# Patient Record
Sex: Male | Born: 1970
Health system: Southern US, Community
[De-identification: ages and names within clinical notes are randomized; demographics above are authoritative.]

## PROBLEM LIST (undated history)

## (undated) DIAGNOSIS — E039 Hypothyroidism, unspecified: Secondary | ICD-10-CM

## (undated) DIAGNOSIS — K219 Gastro-esophageal reflux disease without esophagitis: Secondary | ICD-10-CM

## (undated) DIAGNOSIS — K589 Irritable bowel syndrome without diarrhea: Secondary | ICD-10-CM

## (undated) DIAGNOSIS — I10 Essential (primary) hypertension: Secondary | ICD-10-CM

## (undated) DIAGNOSIS — F319 Bipolar disorder, unspecified: Secondary | ICD-10-CM

## (undated) DIAGNOSIS — F840 Autistic disorder: Secondary | ICD-10-CM

## (undated) DIAGNOSIS — F419 Anxiety disorder, unspecified: Secondary | ICD-10-CM

## (undated) DIAGNOSIS — F845 Asperger's syndrome: Secondary | ICD-10-CM

## (undated) HISTORY — PX: NO PAST SURGERIES: SHX2092

## (undated) HISTORY — DX: Bipolar disorder, unspecified: F31.9

## (undated) HISTORY — DX: Gastro-esophageal reflux disease without esophagitis: K21.9

## (undated) HISTORY — DX: Anxiety disorder, unspecified: F41.9

## (undated) HISTORY — DX: Autistic disorder: F84.0

---

## 2010-08-26 ENCOUNTER — Encounter: Payer: Self-pay | Admitting: Gastroenterology

## 2010-11-15 ENCOUNTER — Other Ambulatory Visit (HOSPITAL_COMMUNITY): Payer: Self-pay | Admitting: Neurology

## 2010-11-15 DIAGNOSIS — R9409 Abnormal results of other function studies of central nervous system: Secondary | ICD-10-CM

## 2010-11-21 ENCOUNTER — Ambulatory Visit (HOSPITAL_COMMUNITY)
Admission: RE | Admit: 2010-11-21 | Discharge: 2010-11-21 | Disposition: A | Discharge status: Home / Self Care | Payer: Medicare Other | Source: Ambulatory Visit | Attending: Neurology | Admitting: Neurology

## 2010-11-21 DIAGNOSIS — R9409 Abnormal results of other function studies of central nervous system: Secondary | ICD-10-CM | POA: Insufficient documentation

## 2010-11-21 DIAGNOSIS — F319 Bipolar disorder, unspecified: Secondary | ICD-10-CM | POA: Insufficient documentation

## 2010-11-21 MED ORDER — GADOBENATE DIMEGLUMINE 529 MG/ML IV SOLN
12.0000 mL | Freq: Once | INTRAVENOUS | Status: AC
  Administered 2010-11-21: 12 mL via INTRAVENOUS

## 2010-11-23 ENCOUNTER — Other Ambulatory Visit: Payer: Self-pay | Admitting: Neurology

## 2010-11-23 DIAGNOSIS — R9089 Other abnormal findings on diagnostic imaging of central nervous system: Secondary | ICD-10-CM

## 2010-11-28 ENCOUNTER — Ambulatory Visit
Admission: RE | Admit: 2010-11-28 | Discharge: 2010-11-28 | Disposition: A | Discharge status: Home / Self Care | Payer: Medicare Other | Source: Ambulatory Visit | Attending: Neurology | Admitting: Neurology

## 2010-11-28 DIAGNOSIS — R9089 Other abnormal findings on diagnostic imaging of central nervous system: Secondary | ICD-10-CM

## 2010-11-30 ENCOUNTER — Telehealth: Payer: Self-pay | Admitting: Family Medicine

## 2010-12-03 ENCOUNTER — Ambulatory Visit
Admission: RE | Admit: 2010-12-03 | Discharge: 2010-12-03 | Disposition: A | Discharge status: Home / Self Care | Payer: Medicare Other | Source: Ambulatory Visit | Attending: Neurology | Admitting: Neurology

## 2010-12-03 ENCOUNTER — Other Ambulatory Visit: Payer: Self-pay | Admitting: Neurology

## 2010-12-03 DIAGNOSIS — G971 Other reaction to spinal and lumbar puncture: Secondary | ICD-10-CM

## 2011-09-02 DIAGNOSIS — R9409 Abnormal results of other function studies of central nervous system: Secondary | ICD-10-CM | POA: Diagnosis not present

## 2011-09-02 DIAGNOSIS — F319 Bipolar disorder, unspecified: Secondary | ICD-10-CM | POA: Diagnosis not present

## 2011-10-08 DIAGNOSIS — F3175 Bipolar disorder, in partial remission, most recent episode depressed: Secondary | ICD-10-CM | POA: Diagnosis not present

## 2011-10-21 DIAGNOSIS — H5710 Ocular pain, unspecified eye: Secondary | ICD-10-CM | POA: Diagnosis not present

## 2011-11-14 DIAGNOSIS — H1045 Other chronic allergic conjunctivitis: Secondary | ICD-10-CM | POA: Diagnosis not present

## 2011-12-10 DIAGNOSIS — IMO0002 Reserved for concepts with insufficient information to code with codable children: Secondary | ICD-10-CM | POA: Diagnosis not present

## 2011-12-10 DIAGNOSIS — Z79899 Other long term (current) drug therapy: Secondary | ICD-10-CM | POA: Diagnosis not present

## 2011-12-10 DIAGNOSIS — F3175 Bipolar disorder, in partial remission, most recent episode depressed: Secondary | ICD-10-CM | POA: Diagnosis not present

## 2011-12-23 DIAGNOSIS — E291 Testicular hypofunction: Secondary | ICD-10-CM | POA: Diagnosis not present

## 2012-03-16 DIAGNOSIS — F319 Bipolar disorder, unspecified: Secondary | ICD-10-CM | POA: Diagnosis not present

## 2012-03-16 DIAGNOSIS — R9409 Abnormal results of other function studies of central nervous system: Secondary | ICD-10-CM | POA: Diagnosis not present

## 2012-03-16 DIAGNOSIS — F848 Other pervasive developmental disorders: Secondary | ICD-10-CM | POA: Diagnosis not present

## 2012-03-24 DIAGNOSIS — F3175 Bipolar disorder, in partial remission, most recent episode depressed: Secondary | ICD-10-CM | POA: Diagnosis not present

## 2012-03-31 DIAGNOSIS — F848 Other pervasive developmental disorders: Secondary | ICD-10-CM | POA: Diagnosis not present

## 2012-03-31 DIAGNOSIS — F319 Bipolar disorder, unspecified: Secondary | ICD-10-CM | POA: Diagnosis not present

## 2012-03-31 DIAGNOSIS — R9409 Abnormal results of other function studies of central nervous system: Secondary | ICD-10-CM | POA: Diagnosis not present

## 2012-05-06 DIAGNOSIS — Z23 Encounter for immunization: Secondary | ICD-10-CM | POA: Diagnosis not present

## 2012-08-17 DIAGNOSIS — E291 Testicular hypofunction: Secondary | ICD-10-CM | POA: Diagnosis not present

## 2012-08-21 DIAGNOSIS — F3175 Bipolar disorder, in partial remission, most recent episode depressed: Secondary | ICD-10-CM | POA: Diagnosis not present

## 2012-08-21 DIAGNOSIS — Z79899 Other long term (current) drug therapy: Secondary | ICD-10-CM | POA: Diagnosis not present

## 2012-09-08 DIAGNOSIS — F3175 Bipolar disorder, in partial remission, most recent episode depressed: Secondary | ICD-10-CM | POA: Diagnosis not present

## 2012-11-19 DIAGNOSIS — Z0000 Encounter for general adult medical examination without abnormal findings: Secondary | ICD-10-CM | POA: Diagnosis not present

## 2012-11-19 DIAGNOSIS — E039 Hypothyroidism, unspecified: Secondary | ICD-10-CM | POA: Diagnosis not present

## 2012-11-19 DIAGNOSIS — Z1331 Encounter for screening for depression: Secondary | ICD-10-CM | POA: Diagnosis not present

## 2012-11-19 DIAGNOSIS — E291 Testicular hypofunction: Secondary | ICD-10-CM | POA: Diagnosis not present

## 2013-01-20 ENCOUNTER — Telehealth: Payer: Self-pay | Admitting: Diagnostic Neuroimaging

## 2013-01-20 NOTE — Telephone Encounter (Signed)
Calling to reschedule patient's appt with Dr. Marjory Lies (03/29/2013)

## 2013-02-23 DIAGNOSIS — F3175 Bipolar disorder, in partial remission, most recent episode depressed: Secondary | ICD-10-CM | POA: Diagnosis not present

## 2013-03-18 ENCOUNTER — Encounter: Payer: Self-pay | Admitting: Nurse Practitioner

## 2013-03-19 ENCOUNTER — Ambulatory Visit (INDEPENDENT_AMBULATORY_CARE_PROVIDER_SITE_OTHER): Payer: Medicare Other | Admitting: Nurse Practitioner

## 2013-03-19 ENCOUNTER — Encounter: Payer: Self-pay | Admitting: Nurse Practitioner

## 2013-03-19 ENCOUNTER — Ambulatory Visit: Payer: Self-pay | Admitting: Diagnostic Neuroimaging

## 2013-03-19 VITALS — BP 128/78 | HR 62 | Ht 68.0 in | Wt 149.0 lb

## 2013-03-19 DIAGNOSIS — F848 Other pervasive developmental disorders: Secondary | ICD-10-CM | POA: Diagnosis not present

## 2013-03-19 DIAGNOSIS — R9409 Abnormal results of other function studies of central nervous system: Secondary | ICD-10-CM

## 2013-03-19 DIAGNOSIS — F845 Asperger's syndrome: Secondary | ICD-10-CM

## 2013-03-19 DIAGNOSIS — F319 Bipolar disorder, unspecified: Secondary | ICD-10-CM | POA: Diagnosis not present

## 2013-03-19 NOTE — Progress Notes (Signed)
[facility]  PATIENT: George [name] DOB: June 15, 1971   HISTORY FROM: patient, chart REASON FOR VISIT:  1 year follow up   HISTORICAL  CHIEF COMPLAINT:  Chief Complaint  Patient presents with  . Establish Care    Rv in room #9    HISTORY OF PRESENT ILLNESS: 42 year old ambidextrous white single male with a history of bipolar affective disease diagnosed at age 10 by Dr. Wynonia Lawman in [place], [place]. He has been on lithium 300 mg t.i.d. and Depakote ER 500, 500, 250  for 1250 mg ER once per day, but t is currently off of  lithium and also tapering Prozac to 10 mg 3/4 per day.He is on Synthroid 0.075 mg daily, vitamin D 3, and  AndroGel squirts four times daily. When he has been placed on Wellbutrin, he has a   "psychotic break".  He heard persistent sounds such as the echo of a flushing toilet. He had  mood swings. He has a long history of anxiety.He has been followed by Dr. Lenis Noon psychiatrist in [place], [place]. Because of fatigue and decreased sexual drive, he was evaluated in [place] and found to have a low testosterone level. An endocrinologist ordered an MRI of the pituitary which was normal except for white matter lesions or hyper intense signal intensities in the brainstem in the right pons, right cerebellar hemisphere, right medulla, right thalamus. There were several periventricular lesions as well that were thought to represent Dawson's fingers. There was no enhancement.He denies headaches, visual loss,  bowel and bladder dysfunction, or Lhermitte's sign. CBC CMP, TSH , thyroxine, and T3 were normal 05/11/10 and 06/18/10. He believes he has celiac disease and is on a gluten-free diet. He also indicates that he is being evaluated for Aspergers disease.  Workup has included normal neurologic examination 11/12/2010, normal lab tests, B12, Sjgren's antibody, ANA, angiotensin-converting enzyme, and sedimentation rate.  MRI 11/21/2010  showed  abnormal T2 and  flair signal throughout the brain, most pronounced in the pons but also elsewhere in the brain stem  There were  multiple patchy areas involving the hemisphere white matter.   The question of demyelinating disease was raised.  CSF evaluation 11/08/2010  showed glucose 60, protein 46, VDRL nonreactive, normal IgG, negative oligoclonal banding, and no white blood cells or red blood cells. He has no neurologic complaints except tremor. Over the last month he has  headaches which he relates to an anxiety situation with his sister. 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 At times he only gets 4 hours sleep. His testosterone dosages have been increased.  He denies visual loss, double vision, swallowing problems, Lhermitte's sign, swallowing problems, slurred speech, blackout spells, focal arm or leg weakness, numbness, or bowel or bladder incontinence. Since tapering his psychiatric medications his sex drive has improved. He describes an episode of panic attack when while riding his scooter  he was pulled over by the police. 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  UPDATE 03/19/13 (LL):  Patient returns for 1 year follow up visit, former patient of Dr. Sandria Manly.  No new neurological problems.  No complaints.  Minor difficulty swallowing, patient thinks that possibly it is psychosomatic because Dr. Sandria Manly told him to be watchful for it at last visit.  Does not get choked on food or cough when eating.  Has regular visits with psychiatrist and PCP, Dr. Kirby Funk.  REVIEW OF  SYSTEMS: Full 14 system review of systems performed and notable only for:  Constitutional: N/A  Cardiovascular: N/A  Ear/Nose/Throat: minor trouble swallowing  Skin: N/A  Eyes: N/A  Respiratory: minor snoring Gastroitestinal: minor constipation  Hematology/Lymphatic: N/A  Endocrine: N/A Musculoskeletal:N/A  Allergy/Immunology: N/A  Neurological: minor depression, minor decreased  energy Psychiatric: N/A   ALLERGIES: Allergies  Allergen Reactions  . Dairy Aid [Lactase]   . Wheat Bran     HOME MEDICATIONS: Outpatient Prescriptions Prior to Visit  Medication Sig Dispense Refill  . Cholecalciferol (VITAMIN D3) 400 UNITS CAPS Take 400 capsules by mouth 3 (three) times daily.      . divalproex (DEPAKOTE) 250 MG DR tablet Take 250 mg by mouth 5 (five) times daily.      Marland Kitchen FLUoxetine (PROZAC) 10 MG capsule Take 10 mg by mouth 4 (four) times daily.      Marland Kitchen levothyroxine (SYNTHROID, LEVOTHROID) 75 MCG tablet Take 75 mcg by mouth daily before breakfast.      . testosterone (ANDROGEL) 50 MG/5GM GEL Place 5 g onto the skin 4 (four) times daily. 4 Pumps per day       No facility-administered medications prior to visit.    PAST MEDICAL HISTORY: Past Medical History  Diagnosis Date  . Bipolar 1 disorder     PAST SURGICAL HISTORY: No past surgical history on file.  FAMILY HISTORY: No family history on file.  SOCIAL HISTORY: History   Social History  . Marital Status: Single    Spouse Name: N/A    Number of Children: N/A  . Years of Education: college   Occupational History  . Not on file.   Social History Main Topics  . Smoking status: Never Smoker   . Smokeless tobacco: Not on file  . Alcohol Use: No  . Drug Use: No  . Sexual Activity: Not on file   Other Topics Concern  . Not on file   Social History Narrative   He lives at home. He is single and has no children.  He denies use of tobacco, alcohol, illicit drugs and caffeine. He works as an Tree surgeon.      Educational psychologist Vitals:   03/19/13 1402  BP: 128/78  Pulse: 62  Height: 5\' 8"  (1.727 m)  Weight: 149 lb (67.586 kg)   Body mass index is 22.66 kg/(m^2).  Neurologic Exam  Mental Status: Alert and oriented to time, place, and person.  Follows one, two and three step commands.  No aphasia, agnosia, or apraxia. Cranial Nerves: Visual fields are full to count fingers examination.   Extraocular movements full. Marland Kitchen  Hearing intact.  No facial asymmetry.  Tongue midline, uvula midline, and gag present.  Sternocleidomastoid and trapezius testing normal. Motor: 5/5 strength proximally and distally in the upper and lower extremities.  No evidence of pronator drift.   Sensory: Intact to pinprick, light touch Coordination:  Outstretched hand and arm tremor.  Intact finger-to-nose, heel-to-shin. Gait and Station: Can toe walk, heel walk, and tandem gait.  Romberg testing is negative.  Reflexes: DTRs 2+ and equal.   DIAGNOSTIC DATA (LABS, IMAGING, TESTING) - I reviewed patient records, labs, notes, testing and imaging myself where available.  MRI brain (with and without contrast) 03/31/12: 1. Multiple round and ovoid, periventricular, subcortical, juxtacortical, pontine, right middle cerebellar peduncle T2 hyperintensities. No abnormal lesions are seen on post contrast views. These findings are non-specific and considerations include autoimmune, inflammatory, post-infectious, microvascular ischemic or migraine associated etiologies.  2. No significant  change from MRI on 11/21/10.  ASSESSMENT AND PLAN 42 year old male with history of Bipolar Disorder, Asperger's, and abnormal MRI. He is doing very well in terms of his neurologic symptomatology.  Last MRI brain was unchanged from previous, nonspecific.  The cause of his white matter changes is unknown.  PLAN: No specific neurological diagnosis has been found.  Patient was advised to follow up with his medical doctor for his other medical concerns.  He may be referred back to [place] if needed at any time in the future for new concerns.  Danesha Kirchoff NP-C 03/19/2013, 2:11 PM  [facility] [address] [place], [place] [address] [phone]

## 2013-03-29 ENCOUNTER — Ambulatory Visit: Payer: Self-pay | Admitting: Diagnostic Neuroimaging

## 2013-04-20 DIAGNOSIS — Z23 Encounter for immunization: Secondary | ICD-10-CM | POA: Diagnosis not present

## 2013-07-23 DIAGNOSIS — Z0289 Encounter for other administrative examinations: Secondary | ICD-10-CM

## 2013-08-10 DIAGNOSIS — IMO0002 Reserved for concepts with insufficient information to code with codable children: Secondary | ICD-10-CM | POA: Diagnosis not present

## 2013-08-10 DIAGNOSIS — F3175 Bipolar disorder, in partial remission, most recent episode depressed: Secondary | ICD-10-CM | POA: Diagnosis not present

## 2013-08-19 ENCOUNTER — Encounter: Payer: Self-pay | Admitting: Nurse Practitioner

## 2013-08-19 ENCOUNTER — Telehealth: Payer: Self-pay | Admitting: Neurology

## 2013-08-19 NOTE — Telephone Encounter (Signed)
Complete. Fax Confirmation at 1737, 08/19/13.

## 2013-08-19 NOTE — Telephone Encounter (Signed)
Patient needs a letter faxed to [facility] fax # [phone]  Stating [facility] has released him from care. Please call the patient back if there are any questions.

## 2013-10-06 DIAGNOSIS — Z79899 Other long term (current) drug therapy: Secondary | ICD-10-CM | POA: Diagnosis not present

## 2013-10-06 DIAGNOSIS — IMO0002 Reserved for concepts with insufficient information to code with codable children: Secondary | ICD-10-CM | POA: Diagnosis not present

## 2013-10-06 DIAGNOSIS — F3175 Bipolar disorder, in partial remission, most recent episode depressed: Secondary | ICD-10-CM | POA: Diagnosis not present

## 2013-12-02 DIAGNOSIS — Z79899 Other long term (current) drug therapy: Secondary | ICD-10-CM | POA: Diagnosis not present

## 2013-12-02 DIAGNOSIS — Z2089 Contact with and (suspected) exposure to other communicable diseases: Secondary | ICD-10-CM | POA: Diagnosis not present

## 2013-12-02 DIAGNOSIS — E039 Hypothyroidism, unspecified: Secondary | ICD-10-CM | POA: Diagnosis not present

## 2013-12-02 DIAGNOSIS — A630 Anogenital (venereal) warts: Secondary | ICD-10-CM | POA: Diagnosis not present

## 2013-12-02 DIAGNOSIS — N529 Male erectile dysfunction, unspecified: Secondary | ICD-10-CM | POA: Diagnosis not present

## 2013-12-02 DIAGNOSIS — E291 Testicular hypofunction: Secondary | ICD-10-CM | POA: Diagnosis not present

## 2013-12-02 DIAGNOSIS — Z125 Encounter for screening for malignant neoplasm of prostate: Secondary | ICD-10-CM | POA: Diagnosis not present

## 2013-12-02 DIAGNOSIS — Z0000 Encounter for general adult medical examination without abnormal findings: Secondary | ICD-10-CM | POA: Diagnosis not present

## 2013-12-13 ENCOUNTER — Telehealth: Payer: Self-pay | Admitting: *Deleted

## 2013-12-13 ENCOUNTER — Telehealth: Payer: Self-pay | Admitting: Diagnostic Neuroimaging

## 2013-12-13 NOTE — Telephone Encounter (Signed)
Called the patient  back he stated he needed a detailed letter stating he does not have MS and can drive without restriction. I advised him Jeani Hawking was not in and will not be able to send a note until weds, patient showed understanding and was very apologetic for his behavior over the phone on his previous call. He stated he has underlined issues within his self.

## 2013-12-13 NOTE — Telephone Encounter (Signed)
Patient calling and is VERY upset, please call him back regarding wrong form that was sent in to government, says he wants to speak with Richeda, states that he will keep calling back every 30 minutes if he does not hear back from [place].

## 2013-12-13 NOTE — Telephone Encounter (Signed)
Called patient and left a message with his mom letting her know i will be resending the first letter NP Charlott Holler sent.George [name]

## 2013-12-17 DIAGNOSIS — A630 Anogenital (venereal) warts: Secondary | ICD-10-CM | POA: Diagnosis not present

## 2014-01-25 DIAGNOSIS — F3175 Bipolar disorder, in partial remission, most recent episode depressed: Secondary | ICD-10-CM | POA: Diagnosis not present

## 2014-01-25 DIAGNOSIS — IMO0002 Reserved for concepts with insufficient information to code with codable children: Secondary | ICD-10-CM | POA: Diagnosis not present

## 2014-02-22 DIAGNOSIS — A630 Anogenital (venereal) warts: Secondary | ICD-10-CM | POA: Diagnosis not present

## 2014-03-04 ENCOUNTER — Ambulatory Visit (INDEPENDENT_AMBULATORY_CARE_PROVIDER_SITE_OTHER): Payer: Medicare Other | Admitting: Family Medicine

## 2014-03-04 VITALS — BP 146/94 | HR 73 | Temp 98.1°F | Resp 18 | Ht 67.5 in | Wt 153.2 lb

## 2014-03-04 DIAGNOSIS — F411 Generalized anxiety disorder: Secondary | ICD-10-CM | POA: Diagnosis not present

## 2014-03-04 DIAGNOSIS — F319 Bipolar disorder, unspecified: Secondary | ICD-10-CM | POA: Diagnosis not present

## 2014-03-04 DIAGNOSIS — R42 Dizziness and giddiness: Secondary | ICD-10-CM | POA: Diagnosis not present

## 2014-03-04 LAB — POCT UA - MICROSCOPIC ONLY
Bacteria, U Microscopic: NEGATIVE
Casts, Ur, LPF, POC: NEGATIVE
Crystals, Ur, HPF, POC: NEGATIVE
Epithelial cells, urine per micros: NEGATIVE
Mucus, UA: NEGATIVE
RBC, urine, microscopic: NEGATIVE
WBC, Ur, HPF, POC: NEGATIVE
Yeast, UA: NEGATIVE

## 2014-03-04 LAB — POCT CBC
Granulocyte percent: 64 %G (ref 37–80)
HCT, POC: 51 % (ref 43.5–53.7)
Hemoglobin: 17 g/dL (ref 14.1–18.1)
Lymph, poc: 1.7 (ref 0.6–3.4)
MCH, POC: 29.2 pg (ref 27–31.2)
MCHC: 33.3 g/dL (ref 31.8–35.4)
MCV: 87.6 fL (ref 80–97)
MID (cbc): 0.3 (ref 0–0.9)
MPV: 9.3 fL (ref 0–99.8)
POC Granulocyte: 3.6 (ref 2–6.9)
POC LYMPH PERCENT: 30.1 %L (ref 10–50)
POC MID %: 5.9 %M (ref 0–12)
Platelet Count, POC: 157 10*3/uL (ref 142–424)
RBC: 5.82 M/uL (ref 4.69–6.13)
RDW, POC: 13.4 %
WBC: 5.6 10*3/uL (ref 4.6–10.2)

## 2014-03-04 LAB — COMPREHENSIVE METABOLIC PANEL
ALT: 21 U/L (ref 0–53)
AST: 18 U/L (ref 0–37)
Albumin: 4.6 g/dL (ref 3.5–5.2)
Alkaline Phosphatase: 58 U/L (ref 39–117)
BUN: 15 mg/dL (ref 6–23)
CO2: 28 mEq/L (ref 19–32)
Calcium: 9.7 mg/dL (ref 8.4–10.5)
Chloride: 101 mEq/L (ref 96–112)
Creat: 1.14 mg/dL (ref 0.50–1.35)
Glucose, Bld: 76 mg/dL (ref 70–99)
Potassium: 4.7 mEq/L (ref 3.5–5.3)
Sodium: 138 mEq/L (ref 135–145)
Total Bilirubin: 0.5 mg/dL (ref 0.2–1.2)
Total Protein: 7.4 g/dL (ref 6.0–8.3)

## 2014-03-04 LAB — POCT URINALYSIS DIPSTICK
Bilirubin, UA: NEGATIVE
Blood, UA: NEGATIVE
Glucose, UA: NEGATIVE
Ketones, UA: NEGATIVE
Leukocytes, UA: NEGATIVE
Nitrite, UA: NEGATIVE
Protein, UA: NEGATIVE
Spec Grav, UA: <=1.005
Urobilinogen, UA: 0.2
pH, UA: 6

## 2014-03-04 MED ORDER — HYDROXYZINE HCL 10 MG PO TABS: 10.0000 mg | ORAL_TABLET | Freq: Three times a day (TID) | ORAL | Status: DC | PRN

## 2014-03-04 NOTE — Progress Notes (Signed)
Chief Complaint:  Chief Complaint  Patient presents with  . Nausea    x this morning  . Dizziness  . Panic Attack    feeel SOB    HPI: George [name] is a 43 y.o. male who is here for  Feeling a high on xanax that he had 3 days ago ( xanax 0.25 mg) , he got dizzy and nauseated. He went to get to the door post and he fell over. THe whole room was spinning and was unable to heep his balance. He was scared. He sat down, he looked on his computer and was worried about the SEs. He stood on one foot and exercised and went away. His sxs went away completely. The last time he took a xanax was either last night but when he woke up this monring this morning he had similar sxs while laying in bed, dizzy and nervousness and did get nauseated when he sat up. He ot scared and has increase respirations and felt liek he was panicking. He tried calling Dr Daine Gip. HE feels alittle but dizzy but litle nauseated. Everything gets dizzy.  He ahs been on xanax for 1-2 weeks intermittently,  + chills when he fell 3 days ago NO URI sxs He has tingling and numbness in both his arms when he is panicking.  He has anxiety attacks, normally anxiety attack he has fear and anger bit mostly fear.   Dr Candis Schatz ( [facility]) --[phone]  4-5 years ago he was tested for MS, lumbar test was negative. He takes testosterone bc his testosteron is low..  He stoppe eating dairy and then he had dairy,   Past Medical History  Diagnosis Date  . Bipolar 1 disorder   . Anxiety   . Autistic spectrum disorder    History reviewed. No pertinent past surgical history. History   Social History  . Marital Status: Single    Spouse Name: N/A    Number of Children: N/A  . Years of Education: college   Social History Main Topics  . Smoking status: Never Smoker   . Smokeless tobacco: None  . Alcohol Use: No  . Drug Use: No  . Sexual Activity: None   Other Topics Concern  . None   Social History Narrative   He  lives at home. He is single and has no children.  He denies use of tobacco, alcohol, illicit drugs and caffeine. He works as an Training and development officer.    No family history on file. Allergies  Allergen Reactions  . Dairy Aid [Lactase]   . Wheat Bran    Prior to Admission medications   Medication Sig Start Date End Date Taking? Authorizing Provider  ALPRAZolam Duanne Moron) 0.25 MG tablet Take 0.25 mg by mouth at bedtime as needed for anxiety.   Yes Historical Provider, MD  Cholecalciferol (VITAMIN D3) 400 UNITS CAPS Take 400 capsules by mouth 3 (three) times daily.   Yes Historical Provider, MD  divalproex (DEPAKOTE) 250 MG DR tablet Take 250 mg by mouth 5 (five) times daily.   Yes Historical Provider, MD  FLUoxetine (PROZAC) 10 MG capsule Take 10 mg by mouth once.    Yes Historical Provider, MD  levothyroxine (SYNTHROID, LEVOTHROID) 75 MCG tablet Take 75 mcg by mouth daily before breakfast.   Yes Historical Provider, MD  testosterone (ANDROGEL) 50 MG/5GM GEL Place 5 g onto the skin 4 (four) times daily. 4 Pumps per day   Yes Historical Provider, MD  ROS: The patient denies fevers, chills, night sweats, unintentional weight loss, chest pain, palpitations, wheezing, dyspnea on exertion, nausea, vomiting, abdominal pain, dysuria, hematuria, melena, numbness, weakness, or tingling.  All other systems have been reviewed and were otherwise negative with the exception of those mentioned in the HPI and as above.    PHYSICAL EXAM: Filed Vitals:   03/04/14 1139  BP: 146/94  Pulse: 73  Temp: 98.1 F (36.7 C)  Resp: 18   Filed Vitals:   03/04/14 1139  Height: 5' 7.5" (1.715 m)  Weight: 153 lb 3.2 oz (69.491 kg)   Body mass index is 23.63 kg/(m^2).  General: Alert, no acute distress HEENT:  Normocephalic, atraumatic, oropharynx patent. EOMI, PERRLA Cardiovascular:  Regular rate and rhythm, no rubs murmurs or gallops.  No Carotid bruits, radial pulse intact. No pedal edema.  Respiratory: Clear to  auscultation bilaterally.  No wheezes, rales, or rhonchi.  No cyanosis, no use of accessory musculature GI: No organomegaly, abdomen is soft and non-tender, positive bowel sounds.  No masses. Skin: No rashes. Neurologic: Facial musculature symmetric. Psychiatric: Patient is appropriate throughout our interaction. Lymphatic: No cervical lymphadenopathy Musculoskeletal: Gait intact.   LABS: Results for orders placed in visit on 03/04/14  POCT URINALYSIS DIPSTICK      Result Value Ref Range   Color, UA yellow     Clarity, UA clear     Glucose, UA neg     Bilirubin, UA neg     Ketones, UA neg     Spec Grav, UA <=1.005     Blood, UA neg     pH, UA 6.0     Protein, UA neg     Urobilinogen, UA 0.2     Nitrite, UA neg     Leukocytes, UA Negative    POCT UA - MICROSCOPIC ONLY      Result Value Ref Range   WBC, Ur, HPF, POC neg     RBC, urine, microscopic neg     Bacteria, U Microscopic neg     Mucus, UA neg     Epithelial cells, urine per micros neg     Crystals, Ur, HPF, POC neg     Casts, Ur, LPF, POC neg     Yeast, UA neg    POCT CBC      Result Value Ref Range   WBC 5.6  4.6 - 10.2 K/uL   Lymph, poc 1.7  0.6 - 3.4   POC LYMPH PERCENT 30.1  10 - 50 %L   MID (cbc) 0.3  0 - 0.9   POC MID % 5.9  0 - 12 %M   POC Granulocyte 3.6  2 - 6.9   Granulocyte percent 64.0  37 - 80 %G   RBC 5.82  4.69 - 6.13 M/uL   Hemoglobin 17.0  14.1 - 18.1 g/dL   HCT, POC 51.0  43.5 - 53.7 %   MCV 87.6  80 - 97 fL   MCH, POC 29.2  27 - 31.2 pg   MCHC 33.3  31.8 - 35.4 g/dL   RDW, POC 13.4     Platelet Count, POC 157  142 - 424 K/uL   MPV 9.3  0 - 99.8 fL     EKG/XRAY:   Primary read interpreted by Dr. Marin Comment at [facility].   ASSESSMENT/PLAN: Encounter Diagnoses  Name Primary?  . Dizziness and giddiness Yes  . Generalized anxiety disorder   . Bipolar disorder, unspecified    Pleasant autistic spectrum 43  y/o male with PMH of bipolar and GAD with intermittent dizziness, possibly realted to  xanax but unlikely or BPPV. His neuor exam is normal except for autistic spectrum tendencies.  Spoke with his psychiatrist and is ok to rx vistaril and take off xanax. NO SI/HI/halluciantions Gross sideeffects, risk and benefits, and alternatives of medications d/w patient. Patient is aware that all medications have potential sideeffects and we are unable to predict every sideeffect or drug-drug interaction that may occur.  Dawnielle Christiana, [place], DO 03/04/2014 2:37 PM

## 2014-03-31 DIAGNOSIS — IMO0002 Reserved for concepts with insufficient information to code with codable children: Secondary | ICD-10-CM | POA: Diagnosis not present

## 2014-03-31 DIAGNOSIS — F3175 Bipolar disorder, in partial remission, most recent episode depressed: Secondary | ICD-10-CM | POA: Diagnosis not present

## 2014-04-03 DIAGNOSIS — Z23 Encounter for immunization: Secondary | ICD-10-CM | POA: Diagnosis not present

## 2014-06-01 ENCOUNTER — Other Ambulatory Visit: Payer: Self-pay | Admitting: Family Medicine

## 2014-06-13 DIAGNOSIS — F3175 Bipolar disorder, in partial remission, most recent episode depressed: Secondary | ICD-10-CM | POA: Diagnosis not present

## 2014-06-13 DIAGNOSIS — Z79899 Other long term (current) drug therapy: Secondary | ICD-10-CM | POA: Diagnosis not present

## 2014-06-28 DIAGNOSIS — F3175 Bipolar disorder, in partial remission, most recent episode depressed: Secondary | ICD-10-CM | POA: Diagnosis not present

## 2014-07-12 DIAGNOSIS — F3175 Bipolar disorder, in partial remission, most recent episode depressed: Secondary | ICD-10-CM | POA: Diagnosis not present

## 2014-09-06 DIAGNOSIS — F3175 Bipolar disorder, in partial remission, most recent episode depressed: Secondary | ICD-10-CM | POA: Diagnosis not present

## 2014-09-07 DIAGNOSIS — N399 Disorder of urinary system, unspecified: Secondary | ICD-10-CM | POA: Diagnosis not present

## 2014-11-08 DIAGNOSIS — Z0100 Encounter for examination of eyes and vision without abnormal findings: Secondary | ICD-10-CM | POA: Diagnosis not present

## 2014-12-05 DIAGNOSIS — Z125 Encounter for screening for malignant neoplasm of prostate: Secondary | ICD-10-CM | POA: Diagnosis not present

## 2014-12-05 DIAGNOSIS — R635 Abnormal weight gain: Secondary | ICD-10-CM | POA: Diagnosis not present

## 2014-12-05 DIAGNOSIS — E291 Testicular hypofunction: Secondary | ICD-10-CM | POA: Diagnosis not present

## 2014-12-05 DIAGNOSIS — E039 Hypothyroidism, unspecified: Secondary | ICD-10-CM | POA: Diagnosis not present

## 2014-12-05 DIAGNOSIS — F319 Bipolar disorder, unspecified: Secondary | ICD-10-CM | POA: Diagnosis not present

## 2014-12-05 DIAGNOSIS — I10 Essential (primary) hypertension: Secondary | ICD-10-CM | POA: Diagnosis not present

## 2014-12-05 DIAGNOSIS — F845 Asperger's syndrome: Secondary | ICD-10-CM | POA: Diagnosis not present

## 2014-12-06 DIAGNOSIS — E291 Testicular hypofunction: Secondary | ICD-10-CM | POA: Diagnosis not present

## 2014-12-06 DIAGNOSIS — E039 Hypothyroidism, unspecified: Secondary | ICD-10-CM | POA: Diagnosis not present

## 2014-12-16 DIAGNOSIS — F319 Bipolar disorder, unspecified: Secondary | ICD-10-CM | POA: Diagnosis not present

## 2014-12-16 DIAGNOSIS — G4700 Insomnia, unspecified: Secondary | ICD-10-CM | POA: Diagnosis not present

## 2015-02-07 DIAGNOSIS — E039 Hypothyroidism, unspecified: Secondary | ICD-10-CM | POA: Diagnosis not present

## 2015-03-16 DIAGNOSIS — A630 Anogenital (venereal) warts: Secondary | ICD-10-CM | POA: Diagnosis not present

## 2015-03-21 DIAGNOSIS — A630 Anogenital (venereal) warts: Secondary | ICD-10-CM | POA: Diagnosis not present

## 2015-03-30 DIAGNOSIS — F3181 Bipolar II disorder: Secondary | ICD-10-CM | POA: Diagnosis not present

## 2015-03-30 DIAGNOSIS — F840 Autistic disorder: Secondary | ICD-10-CM | POA: Diagnosis not present

## 2015-04-06 DIAGNOSIS — F840 Autistic disorder: Secondary | ICD-10-CM | POA: Diagnosis not present

## 2015-04-06 DIAGNOSIS — F3181 Bipolar II disorder: Secondary | ICD-10-CM | POA: Diagnosis not present

## 2015-04-11 DIAGNOSIS — F840 Autistic disorder: Secondary | ICD-10-CM | POA: Diagnosis not present

## 2015-04-11 DIAGNOSIS — F3181 Bipolar II disorder: Secondary | ICD-10-CM | POA: Diagnosis not present

## 2015-04-18 DIAGNOSIS — F3181 Bipolar II disorder: Secondary | ICD-10-CM | POA: Diagnosis not present

## 2015-04-18 DIAGNOSIS — F840 Autistic disorder: Secondary | ICD-10-CM | POA: Diagnosis not present

## 2015-04-19 DIAGNOSIS — E291 Testicular hypofunction: Secondary | ICD-10-CM | POA: Diagnosis not present

## 2015-04-19 DIAGNOSIS — E039 Hypothyroidism, unspecified: Secondary | ICD-10-CM | POA: Diagnosis not present

## 2015-04-25 DIAGNOSIS — F840 Autistic disorder: Secondary | ICD-10-CM | POA: Diagnosis not present

## 2015-04-25 DIAGNOSIS — F3181 Bipolar II disorder: Secondary | ICD-10-CM | POA: Diagnosis not present

## 2015-04-25 DIAGNOSIS — F3174 Bipolar disorder, in full remission, most recent episode manic: Secondary | ICD-10-CM | POA: Diagnosis not present

## 2015-04-26 DIAGNOSIS — E291 Testicular hypofunction: Secondary | ICD-10-CM | POA: Diagnosis not present

## 2015-05-02 DIAGNOSIS — F840 Autistic disorder: Secondary | ICD-10-CM | POA: Diagnosis not present

## 2015-05-02 DIAGNOSIS — Z23 Encounter for immunization: Secondary | ICD-10-CM | POA: Diagnosis not present

## 2015-05-02 DIAGNOSIS — F3181 Bipolar II disorder: Secondary | ICD-10-CM | POA: Diagnosis not present

## 2015-05-09 DIAGNOSIS — F840 Autistic disorder: Secondary | ICD-10-CM | POA: Diagnosis not present

## 2015-05-09 DIAGNOSIS — F3181 Bipolar II disorder: Secondary | ICD-10-CM | POA: Diagnosis not present

## 2015-05-10 DIAGNOSIS — E291 Testicular hypofunction: Secondary | ICD-10-CM | POA: Diagnosis not present

## 2015-05-16 DIAGNOSIS — F840 Autistic disorder: Secondary | ICD-10-CM | POA: Diagnosis not present

## 2015-05-16 DIAGNOSIS — F3181 Bipolar II disorder: Secondary | ICD-10-CM | POA: Diagnosis not present

## 2015-05-23 DIAGNOSIS — A630 Anogenital (venereal) warts: Secondary | ICD-10-CM | POA: Diagnosis not present

## 2015-05-24 DIAGNOSIS — F840 Autistic disorder: Secondary | ICD-10-CM | POA: Diagnosis not present

## 2015-05-24 DIAGNOSIS — F3181 Bipolar II disorder: Secondary | ICD-10-CM | POA: Diagnosis not present

## 2015-05-30 DIAGNOSIS — F3181 Bipolar II disorder: Secondary | ICD-10-CM | POA: Diagnosis not present

## 2015-05-30 DIAGNOSIS — F840 Autistic disorder: Secondary | ICD-10-CM | POA: Diagnosis not present

## 2015-05-31 DIAGNOSIS — E039 Hypothyroidism, unspecified: Secondary | ICD-10-CM | POA: Diagnosis not present

## 2015-06-06 DIAGNOSIS — F3181 Bipolar II disorder: Secondary | ICD-10-CM | POA: Diagnosis not present

## 2015-06-06 DIAGNOSIS — F840 Autistic disorder: Secondary | ICD-10-CM | POA: Diagnosis not present

## 2015-06-21 DIAGNOSIS — E291 Testicular hypofunction: Secondary | ICD-10-CM | POA: Diagnosis not present

## 2015-06-27 DIAGNOSIS — F3181 Bipolar II disorder: Secondary | ICD-10-CM | POA: Diagnosis not present

## 2015-06-27 DIAGNOSIS — F840 Autistic disorder: Secondary | ICD-10-CM | POA: Diagnosis not present

## 2015-07-12 ENCOUNTER — Ambulatory Visit (INDEPENDENT_AMBULATORY_CARE_PROVIDER_SITE_OTHER): Payer: Medicare Other | Admitting: Psychology

## 2015-07-12 DIAGNOSIS — F3131 Bipolar disorder, current episode depressed, mild: Secondary | ICD-10-CM | POA: Diagnosis not present

## 2015-07-12 DIAGNOSIS — F840 Autistic disorder: Secondary | ICD-10-CM | POA: Diagnosis not present

## 2015-07-12 DIAGNOSIS — E291 Testicular hypofunction: Secondary | ICD-10-CM | POA: Diagnosis not present

## 2015-08-02 DIAGNOSIS — E291 Testicular hypofunction: Secondary | ICD-10-CM | POA: Diagnosis not present

## 2015-08-09 ENCOUNTER — Ambulatory Visit: Payer: Medicare Other | Admitting: Psychology

## 2015-08-16 ENCOUNTER — Ambulatory Visit: Payer: Medicare Other | Admitting: Psychology

## 2015-08-23 DIAGNOSIS — E291 Testicular hypofunction: Secondary | ICD-10-CM | POA: Diagnosis not present

## 2015-08-31 DIAGNOSIS — F840 Autistic disorder: Secondary | ICD-10-CM | POA: Diagnosis not present

## 2015-09-06 DIAGNOSIS — F840 Autistic disorder: Secondary | ICD-10-CM | POA: Diagnosis not present

## 2015-09-13 DIAGNOSIS — F840 Autistic disorder: Secondary | ICD-10-CM | POA: Diagnosis not present

## 2015-09-14 DIAGNOSIS — E291 Testicular hypofunction: Secondary | ICD-10-CM | POA: Diagnosis not present

## 2015-09-20 DIAGNOSIS — F840 Autistic disorder: Secondary | ICD-10-CM | POA: Diagnosis not present

## 2015-09-27 DIAGNOSIS — F840 Autistic disorder: Secondary | ICD-10-CM | POA: Diagnosis not present

## 2015-10-04 DIAGNOSIS — F840 Autistic disorder: Secondary | ICD-10-CM | POA: Diagnosis not present

## 2015-10-05 DIAGNOSIS — E291 Testicular hypofunction: Secondary | ICD-10-CM | POA: Diagnosis not present

## 2015-10-18 DIAGNOSIS — F840 Autistic disorder: Secondary | ICD-10-CM | POA: Diagnosis not present

## 2015-10-25 DIAGNOSIS — F840 Autistic disorder: Secondary | ICD-10-CM | POA: Diagnosis not present

## 2015-10-26 DIAGNOSIS — E291 Testicular hypofunction: Secondary | ICD-10-CM | POA: Diagnosis not present

## 2015-11-01 DIAGNOSIS — F840 Autistic disorder: Secondary | ICD-10-CM | POA: Diagnosis not present

## 2015-11-08 DIAGNOSIS — F840 Autistic disorder: Secondary | ICD-10-CM | POA: Diagnosis not present

## 2015-11-10 DIAGNOSIS — H04123 Dry eye syndrome of bilateral lacrimal glands: Secondary | ICD-10-CM | POA: Diagnosis not present

## 2015-11-16 DIAGNOSIS — F840 Autistic disorder: Secondary | ICD-10-CM | POA: Diagnosis not present

## 2015-11-16 DIAGNOSIS — E291 Testicular hypofunction: Secondary | ICD-10-CM | POA: Diagnosis not present

## 2015-11-23 DIAGNOSIS — F840 Autistic disorder: Secondary | ICD-10-CM | POA: Diagnosis not present

## 2015-11-30 DIAGNOSIS — F840 Autistic disorder: Secondary | ICD-10-CM | POA: Diagnosis not present

## 2015-12-07 DIAGNOSIS — F840 Autistic disorder: Secondary | ICD-10-CM | POA: Diagnosis not present

## 2015-12-07 DIAGNOSIS — E291 Testicular hypofunction: Secondary | ICD-10-CM | POA: Diagnosis not present

## 2015-12-12 DIAGNOSIS — F3131 Bipolar disorder, current episode depressed, mild: Secondary | ICD-10-CM | POA: Diagnosis not present

## 2015-12-14 DIAGNOSIS — F840 Autistic disorder: Secondary | ICD-10-CM | POA: Diagnosis not present

## 2015-12-21 DIAGNOSIS — F840 Autistic disorder: Secondary | ICD-10-CM | POA: Diagnosis not present

## 2015-12-28 DIAGNOSIS — E291 Testicular hypofunction: Secondary | ICD-10-CM | POA: Diagnosis not present

## 2016-01-04 DIAGNOSIS — E291 Testicular hypofunction: Secondary | ICD-10-CM | POA: Diagnosis not present

## 2016-01-04 DIAGNOSIS — E039 Hypothyroidism, unspecified: Secondary | ICD-10-CM | POA: Diagnosis not present

## 2016-01-04 DIAGNOSIS — F840 Autistic disorder: Secondary | ICD-10-CM | POA: Diagnosis not present

## 2016-01-04 DIAGNOSIS — Z125 Encounter for screening for malignant neoplasm of prostate: Secondary | ICD-10-CM | POA: Diagnosis not present

## 2016-01-11 DIAGNOSIS — F840 Autistic disorder: Secondary | ICD-10-CM | POA: Diagnosis not present

## 2016-01-18 DIAGNOSIS — F840 Autistic disorder: Secondary | ICD-10-CM | POA: Diagnosis not present

## 2016-01-18 DIAGNOSIS — E291 Testicular hypofunction: Secondary | ICD-10-CM | POA: Diagnosis not present

## 2016-01-23 DIAGNOSIS — A630 Anogenital (venereal) warts: Secondary | ICD-10-CM | POA: Diagnosis not present

## 2016-01-25 DIAGNOSIS — F840 Autistic disorder: Secondary | ICD-10-CM | POA: Diagnosis not present

## 2016-02-01 DIAGNOSIS — F840 Autistic disorder: Secondary | ICD-10-CM | POA: Diagnosis not present

## 2016-02-08 DIAGNOSIS — F840 Autistic disorder: Secondary | ICD-10-CM | POA: Diagnosis not present

## 2016-02-08 DIAGNOSIS — E291 Testicular hypofunction: Secondary | ICD-10-CM | POA: Diagnosis not present

## 2016-02-15 DIAGNOSIS — F840 Autistic disorder: Secondary | ICD-10-CM | POA: Diagnosis not present

## 2016-02-22 DIAGNOSIS — F840 Autistic disorder: Secondary | ICD-10-CM | POA: Diagnosis not present

## 2016-02-29 DIAGNOSIS — F840 Autistic disorder: Secondary | ICD-10-CM | POA: Diagnosis not present

## 2016-03-01 DIAGNOSIS — E291 Testicular hypofunction: Secondary | ICD-10-CM | POA: Diagnosis not present

## 2016-03-07 DIAGNOSIS — F840 Autistic disorder: Secondary | ICD-10-CM | POA: Diagnosis not present

## 2016-03-11 DIAGNOSIS — F840 Autistic disorder: Secondary | ICD-10-CM | POA: Diagnosis not present

## 2016-03-22 DIAGNOSIS — F840 Autistic disorder: Secondary | ICD-10-CM | POA: Diagnosis not present

## 2016-03-22 DIAGNOSIS — Z7251 High risk heterosexual behavior: Secondary | ICD-10-CM | POA: Diagnosis not present

## 2016-03-22 DIAGNOSIS — E291 Testicular hypofunction: Secondary | ICD-10-CM | POA: Diagnosis not present

## 2016-03-28 DIAGNOSIS — Z23 Encounter for immunization: Secondary | ICD-10-CM | POA: Diagnosis not present

## 2016-04-02 DIAGNOSIS — F840 Autistic disorder: Secondary | ICD-10-CM | POA: Diagnosis not present

## 2016-04-04 DIAGNOSIS — F840 Autistic disorder: Secondary | ICD-10-CM | POA: Diagnosis not present

## 2016-04-11 DIAGNOSIS — F840 Autistic disorder: Secondary | ICD-10-CM | POA: Diagnosis not present

## 2016-04-12 DIAGNOSIS — E291 Testicular hypofunction: Secondary | ICD-10-CM | POA: Diagnosis not present

## 2016-04-18 DIAGNOSIS — F840 Autistic disorder: Secondary | ICD-10-CM | POA: Diagnosis not present

## 2016-05-03 DIAGNOSIS — E291 Testicular hypofunction: Secondary | ICD-10-CM | POA: Diagnosis not present

## 2016-05-09 DIAGNOSIS — F3181 Bipolar II disorder: Secondary | ICD-10-CM | POA: Diagnosis not present

## 2016-05-09 DIAGNOSIS — F840 Autistic disorder: Secondary | ICD-10-CM | POA: Diagnosis not present

## 2016-05-22 DIAGNOSIS — F3181 Bipolar II disorder: Secondary | ICD-10-CM | POA: Diagnosis not present

## 2016-05-22 DIAGNOSIS — F840 Autistic disorder: Secondary | ICD-10-CM | POA: Diagnosis not present

## 2016-05-24 DIAGNOSIS — E291 Testicular hypofunction: Secondary | ICD-10-CM | POA: Diagnosis not present

## 2016-05-28 DIAGNOSIS — F3181 Bipolar II disorder: Secondary | ICD-10-CM | POA: Diagnosis not present

## 2016-05-28 DIAGNOSIS — F840 Autistic disorder: Secondary | ICD-10-CM | POA: Diagnosis not present

## 2016-06-05 DIAGNOSIS — F840 Autistic disorder: Secondary | ICD-10-CM | POA: Diagnosis not present

## 2016-06-05 DIAGNOSIS — F3181 Bipolar II disorder: Secondary | ICD-10-CM | POA: Diagnosis not present

## 2016-06-10 DIAGNOSIS — F3174 Bipolar disorder, in full remission, most recent episode manic: Secondary | ICD-10-CM | POA: Diagnosis not present

## 2016-06-17 DIAGNOSIS — E039 Hypothyroidism, unspecified: Secondary | ICD-10-CM | POA: Diagnosis not present

## 2016-06-18 DIAGNOSIS — F3181 Bipolar II disorder: Secondary | ICD-10-CM | POA: Diagnosis not present

## 2016-06-18 DIAGNOSIS — F840 Autistic disorder: Secondary | ICD-10-CM | POA: Diagnosis not present

## 2016-06-25 DIAGNOSIS — F3181 Bipolar II disorder: Secondary | ICD-10-CM | POA: Diagnosis not present

## 2016-06-25 DIAGNOSIS — F840 Autistic disorder: Secondary | ICD-10-CM | POA: Diagnosis not present

## 2016-07-02 DIAGNOSIS — F3181 Bipolar II disorder: Secondary | ICD-10-CM | POA: Diagnosis not present

## 2016-07-02 DIAGNOSIS — F840 Autistic disorder: Secondary | ICD-10-CM | POA: Diagnosis not present

## 2016-07-08 DIAGNOSIS — E291 Testicular hypofunction: Secondary | ICD-10-CM | POA: Diagnosis not present

## 2016-07-09 DIAGNOSIS — F3181 Bipolar II disorder: Secondary | ICD-10-CM | POA: Diagnosis not present

## 2016-07-09 DIAGNOSIS — F840 Autistic disorder: Secondary | ICD-10-CM | POA: Diagnosis not present

## 2016-07-23 DIAGNOSIS — F3181 Bipolar II disorder: Secondary | ICD-10-CM | POA: Diagnosis not present

## 2016-07-23 DIAGNOSIS — F840 Autistic disorder: Secondary | ICD-10-CM | POA: Diagnosis not present

## 2016-07-29 ENCOUNTER — Encounter (HOSPITAL_COMMUNITY): Payer: Self-pay | Admitting: Emergency Medicine

## 2016-07-29 ENCOUNTER — Emergency Department (HOSPITAL_COMMUNITY)
Admission: EM | Admit: 2016-07-29 | Discharge: 2016-07-29 | Disposition: A | Discharge status: Home / Self Care | Payer: Medicare Other | Attending: Emergency Medicine | Admitting: Emergency Medicine

## 2016-07-29 DIAGNOSIS — Y999 Unspecified external cause status: Secondary | ICD-10-CM | POA: Insufficient documentation

## 2016-07-29 DIAGNOSIS — Y929 Unspecified place or not applicable: Secondary | ICD-10-CM | POA: Diagnosis not present

## 2016-07-29 DIAGNOSIS — X131XXA Other contact with steam and other hot vapors, initial encounter: Secondary | ICD-10-CM | POA: Diagnosis not present

## 2016-07-29 DIAGNOSIS — T2101XA Burn of unspecified degree of chest wall, initial encounter: Secondary | ICD-10-CM | POA: Insufficient documentation

## 2016-07-29 DIAGNOSIS — Z5321 Procedure and treatment not carried out due to patient leaving prior to being seen by health care provider: Secondary | ICD-10-CM | POA: Insufficient documentation

## 2016-07-29 DIAGNOSIS — Y939 Activity, unspecified: Secondary | ICD-10-CM | POA: Insufficient documentation

## 2016-07-29 NOTE — ED Triage Notes (Signed)
Pt was splashed by water on his upper chest after opening a steam dish sanitizer. Redness noted to upper chest.

## 2016-07-29 NOTE — ED Notes (Signed)
Patient reports he wants to leave because "the burn isn't that bad." Patient speaking in complete sentences in NAD, alert and oriented, and ambulatory with steady gait.

## 2016-07-30 DIAGNOSIS — E291 Testicular hypofunction: Secondary | ICD-10-CM | POA: Diagnosis not present

## 2016-07-30 DIAGNOSIS — F840 Autistic disorder: Secondary | ICD-10-CM | POA: Diagnosis not present

## 2016-07-30 DIAGNOSIS — F3181 Bipolar II disorder: Secondary | ICD-10-CM | POA: Diagnosis not present

## 2016-08-13 DIAGNOSIS — F3181 Bipolar II disorder: Secondary | ICD-10-CM | POA: Diagnosis not present

## 2016-08-13 DIAGNOSIS — F840 Autistic disorder: Secondary | ICD-10-CM | POA: Diagnosis not present

## 2016-08-20 DIAGNOSIS — F3181 Bipolar II disorder: Secondary | ICD-10-CM | POA: Diagnosis not present

## 2016-08-20 DIAGNOSIS — E291 Testicular hypofunction: Secondary | ICD-10-CM | POA: Diagnosis not present

## 2016-08-20 DIAGNOSIS — F840 Autistic disorder: Secondary | ICD-10-CM | POA: Diagnosis not present

## 2016-08-21 DIAGNOSIS — F840 Autistic disorder: Secondary | ICD-10-CM | POA: Diagnosis not present

## 2016-08-21 DIAGNOSIS — F3181 Bipolar II disorder: Secondary | ICD-10-CM | POA: Diagnosis not present

## 2016-08-27 DIAGNOSIS — F840 Autistic disorder: Secondary | ICD-10-CM | POA: Diagnosis not present

## 2016-08-27 DIAGNOSIS — F3181 Bipolar II disorder: Secondary | ICD-10-CM | POA: Diagnosis not present

## 2016-09-03 DIAGNOSIS — F840 Autistic disorder: Secondary | ICD-10-CM | POA: Diagnosis not present

## 2016-09-03 DIAGNOSIS — F3181 Bipolar II disorder: Secondary | ICD-10-CM | POA: Diagnosis not present

## 2016-09-10 DIAGNOSIS — F3181 Bipolar II disorder: Secondary | ICD-10-CM | POA: Diagnosis not present

## 2016-09-10 DIAGNOSIS — E291 Testicular hypofunction: Secondary | ICD-10-CM | POA: Diagnosis not present

## 2016-09-10 DIAGNOSIS — F840 Autistic disorder: Secondary | ICD-10-CM | POA: Diagnosis not present

## 2016-09-17 DIAGNOSIS — F840 Autistic disorder: Secondary | ICD-10-CM | POA: Diagnosis not present

## 2016-09-17 DIAGNOSIS — F3181 Bipolar II disorder: Secondary | ICD-10-CM | POA: Diagnosis not present

## 2016-10-01 DIAGNOSIS — F3181 Bipolar II disorder: Secondary | ICD-10-CM | POA: Diagnosis not present

## 2016-10-01 DIAGNOSIS — E291 Testicular hypofunction: Secondary | ICD-10-CM | POA: Diagnosis not present

## 2016-10-01 DIAGNOSIS — F840 Autistic disorder: Secondary | ICD-10-CM | POA: Diagnosis not present

## 2016-10-08 DIAGNOSIS — F840 Autistic disorder: Secondary | ICD-10-CM | POA: Diagnosis not present

## 2016-10-08 DIAGNOSIS — F3181 Bipolar II disorder: Secondary | ICD-10-CM | POA: Diagnosis not present

## 2016-10-22 DIAGNOSIS — E291 Testicular hypofunction: Secondary | ICD-10-CM | POA: Diagnosis not present

## 2016-10-22 DIAGNOSIS — F3181 Bipolar II disorder: Secondary | ICD-10-CM | POA: Diagnosis not present

## 2016-10-22 DIAGNOSIS — F840 Autistic disorder: Secondary | ICD-10-CM | POA: Diagnosis not present

## 2016-11-12 DIAGNOSIS — E291 Testicular hypofunction: Secondary | ICD-10-CM | POA: Diagnosis not present

## 2016-11-19 DIAGNOSIS — F840 Autistic disorder: Secondary | ICD-10-CM | POA: Diagnosis not present

## 2016-11-19 DIAGNOSIS — F3181 Bipolar II disorder: Secondary | ICD-10-CM | POA: Diagnosis not present

## 2016-11-27 DIAGNOSIS — F840 Autistic disorder: Secondary | ICD-10-CM | POA: Diagnosis not present

## 2016-11-27 DIAGNOSIS — F3181 Bipolar II disorder: Secondary | ICD-10-CM | POA: Diagnosis not present

## 2016-12-03 DIAGNOSIS — E291 Testicular hypofunction: Secondary | ICD-10-CM | POA: Diagnosis not present

## 2016-12-06 DIAGNOSIS — F840 Autistic disorder: Secondary | ICD-10-CM | POA: Diagnosis not present

## 2016-12-06 DIAGNOSIS — F3181 Bipolar II disorder: Secondary | ICD-10-CM | POA: Diagnosis not present

## 2016-12-10 DIAGNOSIS — F3181 Bipolar II disorder: Secondary | ICD-10-CM | POA: Diagnosis not present

## 2016-12-10 DIAGNOSIS — F840 Autistic disorder: Secondary | ICD-10-CM | POA: Diagnosis not present

## 2016-12-17 DIAGNOSIS — F840 Autistic disorder: Secondary | ICD-10-CM | POA: Diagnosis not present

## 2016-12-17 DIAGNOSIS — F3181 Bipolar II disorder: Secondary | ICD-10-CM | POA: Diagnosis not present

## 2016-12-24 DIAGNOSIS — F3181 Bipolar II disorder: Secondary | ICD-10-CM | POA: Diagnosis not present

## 2016-12-24 DIAGNOSIS — E039 Hypothyroidism, unspecified: Secondary | ICD-10-CM | POA: Diagnosis not present

## 2016-12-24 DIAGNOSIS — E291 Testicular hypofunction: Secondary | ICD-10-CM | POA: Diagnosis not present

## 2016-12-24 DIAGNOSIS — F840 Autistic disorder: Secondary | ICD-10-CM | POA: Diagnosis not present

## 2016-12-31 DIAGNOSIS — F840 Autistic disorder: Secondary | ICD-10-CM | POA: Diagnosis not present

## 2016-12-31 DIAGNOSIS — F3181 Bipolar II disorder: Secondary | ICD-10-CM | POA: Diagnosis not present

## 2017-01-06 DIAGNOSIS — Z0000 Encounter for general adult medical examination without abnormal findings: Secondary | ICD-10-CM | POA: Diagnosis not present

## 2017-01-06 DIAGNOSIS — E291 Testicular hypofunction: Secondary | ICD-10-CM | POA: Diagnosis not present

## 2017-01-06 DIAGNOSIS — Z125 Encounter for screening for malignant neoplasm of prostate: Secondary | ICD-10-CM | POA: Diagnosis not present

## 2017-01-06 DIAGNOSIS — F319 Bipolar disorder, unspecified: Secondary | ICD-10-CM | POA: Diagnosis not present

## 2017-01-06 DIAGNOSIS — E039 Hypothyroidism, unspecified: Secondary | ICD-10-CM | POA: Diagnosis not present

## 2017-01-06 DIAGNOSIS — Z136 Encounter for screening for cardiovascular disorders: Secondary | ICD-10-CM | POA: Diagnosis not present

## 2017-01-06 DIAGNOSIS — Z79899 Other long term (current) drug therapy: Secondary | ICD-10-CM | POA: Diagnosis not present

## 2017-01-07 DIAGNOSIS — F3181 Bipolar II disorder: Secondary | ICD-10-CM | POA: Diagnosis not present

## 2017-01-07 DIAGNOSIS — F840 Autistic disorder: Secondary | ICD-10-CM | POA: Diagnosis not present

## 2017-01-08 DIAGNOSIS — F3174 Bipolar disorder, in full remission, most recent episode manic: Secondary | ICD-10-CM | POA: Diagnosis not present

## 2017-01-14 DIAGNOSIS — F3181 Bipolar II disorder: Secondary | ICD-10-CM | POA: Diagnosis not present

## 2017-01-14 DIAGNOSIS — F840 Autistic disorder: Secondary | ICD-10-CM | POA: Diagnosis not present

## 2017-01-14 DIAGNOSIS — E291 Testicular hypofunction: Secondary | ICD-10-CM | POA: Diagnosis not present

## 2017-01-15 DIAGNOSIS — R1012 Left upper quadrant pain: Secondary | ICD-10-CM | POA: Diagnosis not present

## 2017-01-15 DIAGNOSIS — N529 Male erectile dysfunction, unspecified: Secondary | ICD-10-CM | POA: Diagnosis not present

## 2017-01-15 DIAGNOSIS — R1032 Left lower quadrant pain: Secondary | ICD-10-CM | POA: Diagnosis not present

## 2017-01-21 DIAGNOSIS — F3181 Bipolar II disorder: Secondary | ICD-10-CM | POA: Diagnosis not present

## 2017-01-21 DIAGNOSIS — F840 Autistic disorder: Secondary | ICD-10-CM | POA: Diagnosis not present

## 2017-01-28 DIAGNOSIS — F3181 Bipolar II disorder: Secondary | ICD-10-CM | POA: Diagnosis not present

## 2017-01-28 DIAGNOSIS — F840 Autistic disorder: Secondary | ICD-10-CM | POA: Diagnosis not present

## 2017-02-04 DIAGNOSIS — F3181 Bipolar II disorder: Secondary | ICD-10-CM | POA: Diagnosis not present

## 2017-02-04 DIAGNOSIS — E291 Testicular hypofunction: Secondary | ICD-10-CM | POA: Diagnosis not present

## 2017-02-04 DIAGNOSIS — F840 Autistic disorder: Secondary | ICD-10-CM | POA: Diagnosis not present

## 2017-02-11 DIAGNOSIS — F840 Autistic disorder: Secondary | ICD-10-CM | POA: Diagnosis not present

## 2017-02-11 DIAGNOSIS — F3181 Bipolar II disorder: Secondary | ICD-10-CM | POA: Diagnosis not present

## 2017-02-18 DIAGNOSIS — F3181 Bipolar II disorder: Secondary | ICD-10-CM | POA: Diagnosis not present

## 2017-02-18 DIAGNOSIS — F840 Autistic disorder: Secondary | ICD-10-CM | POA: Diagnosis not present

## 2017-02-20 DIAGNOSIS — M79674 Pain in right toe(s): Secondary | ICD-10-CM | POA: Diagnosis not present

## 2017-02-20 DIAGNOSIS — R1013 Epigastric pain: Secondary | ICD-10-CM | POA: Diagnosis not present

## 2017-02-25 DIAGNOSIS — E291 Testicular hypofunction: Secondary | ICD-10-CM | POA: Diagnosis not present

## 2017-02-25 DIAGNOSIS — F840 Autistic disorder: Secondary | ICD-10-CM | POA: Diagnosis not present

## 2017-02-25 DIAGNOSIS — F3181 Bipolar II disorder: Secondary | ICD-10-CM | POA: Diagnosis not present

## 2017-03-04 DIAGNOSIS — F3181 Bipolar II disorder: Secondary | ICD-10-CM | POA: Diagnosis not present

## 2017-03-04 DIAGNOSIS — F840 Autistic disorder: Secondary | ICD-10-CM | POA: Diagnosis not present

## 2017-03-11 DIAGNOSIS — F3181 Bipolar II disorder: Secondary | ICD-10-CM | POA: Diagnosis not present

## 2017-03-11 DIAGNOSIS — F840 Autistic disorder: Secondary | ICD-10-CM | POA: Diagnosis not present

## 2017-03-18 DIAGNOSIS — E291 Testicular hypofunction: Secondary | ICD-10-CM | POA: Diagnosis not present

## 2017-03-18 DIAGNOSIS — F840 Autistic disorder: Secondary | ICD-10-CM | POA: Diagnosis not present

## 2017-03-18 DIAGNOSIS — F3181 Bipolar II disorder: Secondary | ICD-10-CM | POA: Diagnosis not present

## 2017-03-20 DIAGNOSIS — R1013 Epigastric pain: Secondary | ICD-10-CM | POA: Diagnosis not present

## 2017-03-20 DIAGNOSIS — Z113 Encounter for screening for infections with a predominantly sexual mode of transmission: Secondary | ICD-10-CM | POA: Diagnosis not present

## 2017-03-25 DIAGNOSIS — R195 Other fecal abnormalities: Secondary | ICD-10-CM | POA: Diagnosis not present

## 2017-03-25 DIAGNOSIS — F3181 Bipolar II disorder: Secondary | ICD-10-CM | POA: Diagnosis not present

## 2017-03-25 DIAGNOSIS — F840 Autistic disorder: Secondary | ICD-10-CM | POA: Diagnosis not present

## 2017-03-26 DIAGNOSIS — K589 Irritable bowel syndrome without diarrhea: Secondary | ICD-10-CM | POA: Diagnosis not present

## 2017-04-11 DIAGNOSIS — E291 Testicular hypofunction: Secondary | ICD-10-CM | POA: Diagnosis not present

## 2017-04-15 DIAGNOSIS — F840 Autistic disorder: Secondary | ICD-10-CM | POA: Diagnosis not present

## 2017-04-15 DIAGNOSIS — F3181 Bipolar II disorder: Secondary | ICD-10-CM | POA: Diagnosis not present

## 2017-04-16 DIAGNOSIS — Z23 Encounter for immunization: Secondary | ICD-10-CM | POA: Diagnosis not present

## 2017-04-22 DIAGNOSIS — F840 Autistic disorder: Secondary | ICD-10-CM | POA: Diagnosis not present

## 2017-04-22 DIAGNOSIS — F3181 Bipolar II disorder: Secondary | ICD-10-CM | POA: Diagnosis not present

## 2017-04-28 DIAGNOSIS — J029 Acute pharyngitis, unspecified: Secondary | ICD-10-CM | POA: Diagnosis not present

## 2017-04-29 DIAGNOSIS — F840 Autistic disorder: Secondary | ICD-10-CM | POA: Diagnosis not present

## 2017-04-29 DIAGNOSIS — F3181 Bipolar II disorder: Secondary | ICD-10-CM | POA: Diagnosis not present

## 2017-05-02 DIAGNOSIS — E291 Testicular hypofunction: Secondary | ICD-10-CM | POA: Diagnosis not present

## 2017-05-13 DIAGNOSIS — F840 Autistic disorder: Secondary | ICD-10-CM | POA: Diagnosis not present

## 2017-05-13 DIAGNOSIS — F3181 Bipolar II disorder: Secondary | ICD-10-CM | POA: Diagnosis not present

## 2017-05-20 DIAGNOSIS — F840 Autistic disorder: Secondary | ICD-10-CM | POA: Diagnosis not present

## 2017-05-20 DIAGNOSIS — F3181 Bipolar II disorder: Secondary | ICD-10-CM | POA: Diagnosis not present

## 2017-05-26 DIAGNOSIS — E291 Testicular hypofunction: Secondary | ICD-10-CM | POA: Diagnosis not present

## 2017-05-27 DIAGNOSIS — F840 Autistic disorder: Secondary | ICD-10-CM | POA: Diagnosis not present

## 2017-05-27 DIAGNOSIS — F3181 Bipolar II disorder: Secondary | ICD-10-CM | POA: Diagnosis not present

## 2017-06-03 DIAGNOSIS — F840 Autistic disorder: Secondary | ICD-10-CM | POA: Diagnosis not present

## 2017-06-03 DIAGNOSIS — F3181 Bipolar II disorder: Secondary | ICD-10-CM | POA: Diagnosis not present

## 2017-06-16 DIAGNOSIS — E291 Testicular hypofunction: Secondary | ICD-10-CM | POA: Diagnosis not present

## 2017-06-17 DIAGNOSIS — F840 Autistic disorder: Secondary | ICD-10-CM | POA: Diagnosis not present

## 2017-06-17 DIAGNOSIS — F3181 Bipolar II disorder: Secondary | ICD-10-CM | POA: Diagnosis not present

## 2017-06-24 DIAGNOSIS — F3181 Bipolar II disorder: Secondary | ICD-10-CM | POA: Diagnosis not present

## 2017-06-24 DIAGNOSIS — F840 Autistic disorder: Secondary | ICD-10-CM | POA: Diagnosis not present

## 2017-07-01 DIAGNOSIS — F3181 Bipolar II disorder: Secondary | ICD-10-CM | POA: Diagnosis not present

## 2017-07-01 DIAGNOSIS — F840 Autistic disorder: Secondary | ICD-10-CM | POA: Diagnosis not present

## 2017-07-07 DIAGNOSIS — E291 Testicular hypofunction: Secondary | ICD-10-CM | POA: Diagnosis not present

## 2017-07-08 DIAGNOSIS — F3181 Bipolar II disorder: Secondary | ICD-10-CM | POA: Diagnosis not present

## 2017-07-08 DIAGNOSIS — F840 Autistic disorder: Secondary | ICD-10-CM | POA: Diagnosis not present

## 2017-07-15 DIAGNOSIS — F840 Autistic disorder: Secondary | ICD-10-CM | POA: Diagnosis not present

## 2017-07-15 DIAGNOSIS — F3181 Bipolar II disorder: Secondary | ICD-10-CM | POA: Diagnosis not present

## 2017-07-15 DIAGNOSIS — F3174 Bipolar disorder, in full remission, most recent episode manic: Secondary | ICD-10-CM | POA: Diagnosis not present

## 2017-08-01 DIAGNOSIS — E291 Testicular hypofunction: Secondary | ICD-10-CM | POA: Diagnosis not present

## 2017-08-21 DIAGNOSIS — E291 Testicular hypofunction: Secondary | ICD-10-CM | POA: Diagnosis not present

## 2017-08-21 DIAGNOSIS — R1084 Generalized abdominal pain: Secondary | ICD-10-CM | POA: Diagnosis not present

## 2017-08-21 DIAGNOSIS — K644 Residual hemorrhoidal skin tags: Secondary | ICD-10-CM | POA: Diagnosis not present

## 2017-09-11 DIAGNOSIS — E291 Testicular hypofunction: Secondary | ICD-10-CM | POA: Diagnosis not present

## 2017-10-03 DIAGNOSIS — E291 Testicular hypofunction: Secondary | ICD-10-CM | POA: Diagnosis not present

## 2017-10-24 DIAGNOSIS — E291 Testicular hypofunction: Secondary | ICD-10-CM | POA: Diagnosis not present

## 2017-11-14 DIAGNOSIS — E291 Testicular hypofunction: Secondary | ICD-10-CM | POA: Diagnosis not present

## 2017-12-08 DIAGNOSIS — E291 Testicular hypofunction: Secondary | ICD-10-CM | POA: Diagnosis not present

## 2017-12-09 ENCOUNTER — Ambulatory Visit: Payer: Self-pay | Admitting: Internal Medicine

## 2017-12-09 DIAGNOSIS — E739 Lactose intolerance, unspecified: Secondary | ICD-10-CM | POA: Diagnosis not present

## 2017-12-09 DIAGNOSIS — K9041 Non-celiac gluten sensitivity: Secondary | ICD-10-CM | POA: Diagnosis not present

## 2017-12-30 DIAGNOSIS — E291 Testicular hypofunction: Secondary | ICD-10-CM | POA: Diagnosis not present

## 2018-01-08 DIAGNOSIS — E291 Testicular hypofunction: Secondary | ICD-10-CM | POA: Diagnosis not present

## 2018-01-08 DIAGNOSIS — E039 Hypothyroidism, unspecified: Secondary | ICD-10-CM | POA: Diagnosis not present

## 2018-01-08 DIAGNOSIS — K589 Irritable bowel syndrome without diarrhea: Secondary | ICD-10-CM | POA: Diagnosis not present

## 2018-01-08 DIAGNOSIS — R29898 Other symptoms and signs involving the musculoskeletal system: Secondary | ICD-10-CM | POA: Diagnosis not present

## 2018-01-08 DIAGNOSIS — I10 Essential (primary) hypertension: Secondary | ICD-10-CM | POA: Diagnosis not present

## 2018-01-20 DIAGNOSIS — E291 Testicular hypofunction: Secondary | ICD-10-CM | POA: Diagnosis not present

## 2018-02-10 DIAGNOSIS — E291 Testicular hypofunction: Secondary | ICD-10-CM | POA: Diagnosis not present

## 2018-02-13 DIAGNOSIS — F840 Autistic disorder: Secondary | ICD-10-CM | POA: Diagnosis not present

## 2018-02-13 DIAGNOSIS — F3181 Bipolar II disorder: Secondary | ICD-10-CM | POA: Diagnosis not present

## 2018-02-20 DIAGNOSIS — F3181 Bipolar II disorder: Secondary | ICD-10-CM | POA: Diagnosis not present

## 2018-02-20 DIAGNOSIS — F840 Autistic disorder: Secondary | ICD-10-CM | POA: Diagnosis not present

## 2018-02-25 DIAGNOSIS — F840 Autistic disorder: Secondary | ICD-10-CM | POA: Diagnosis not present

## 2018-02-25 DIAGNOSIS — M722 Plantar fascial fibromatosis: Secondary | ICD-10-CM | POA: Diagnosis not present

## 2018-02-25 DIAGNOSIS — F3181 Bipolar II disorder: Secondary | ICD-10-CM | POA: Diagnosis not present

## 2018-03-13 DIAGNOSIS — F3181 Bipolar II disorder: Secondary | ICD-10-CM | POA: Diagnosis not present

## 2018-03-13 DIAGNOSIS — F840 Autistic disorder: Secondary | ICD-10-CM | POA: Diagnosis not present

## 2018-03-20 DIAGNOSIS — F840 Autistic disorder: Secondary | ICD-10-CM | POA: Diagnosis not present

## 2018-03-20 DIAGNOSIS — F3181 Bipolar II disorder: Secondary | ICD-10-CM | POA: Diagnosis not present

## 2018-03-25 DIAGNOSIS — F3181 Bipolar II disorder: Secondary | ICD-10-CM | POA: Diagnosis not present

## 2018-03-25 DIAGNOSIS — F840 Autistic disorder: Secondary | ICD-10-CM | POA: Diagnosis not present

## 2018-04-03 DIAGNOSIS — F3181 Bipolar II disorder: Secondary | ICD-10-CM | POA: Diagnosis not present

## 2018-04-03 DIAGNOSIS — F840 Autistic disorder: Secondary | ICD-10-CM | POA: Diagnosis not present

## 2018-04-10 DIAGNOSIS — F3181 Bipolar II disorder: Secondary | ICD-10-CM | POA: Diagnosis not present

## 2018-04-10 DIAGNOSIS — F840 Autistic disorder: Secondary | ICD-10-CM | POA: Diagnosis not present

## 2018-04-16 DIAGNOSIS — F3181 Bipolar II disorder: Secondary | ICD-10-CM | POA: Diagnosis not present

## 2018-04-16 DIAGNOSIS — F840 Autistic disorder: Secondary | ICD-10-CM | POA: Diagnosis not present

## 2018-04-17 DIAGNOSIS — F840 Autistic disorder: Secondary | ICD-10-CM | POA: Diagnosis not present

## 2018-04-17 DIAGNOSIS — F3181 Bipolar II disorder: Secondary | ICD-10-CM | POA: Diagnosis not present

## 2018-04-21 DIAGNOSIS — F3181 Bipolar II disorder: Secondary | ICD-10-CM | POA: Diagnosis not present

## 2018-04-21 DIAGNOSIS — F840 Autistic disorder: Secondary | ICD-10-CM | POA: Diagnosis not present

## 2018-04-29 DIAGNOSIS — Z1211 Encounter for screening for malignant neoplasm of colon: Secondary | ICD-10-CM | POA: Diagnosis not present

## 2018-04-29 DIAGNOSIS — K589 Irritable bowel syndrome without diarrhea: Secondary | ICD-10-CM | POA: Diagnosis not present

## 2018-05-01 DIAGNOSIS — F840 Autistic disorder: Secondary | ICD-10-CM | POA: Diagnosis not present

## 2018-05-01 DIAGNOSIS — F3181 Bipolar II disorder: Secondary | ICD-10-CM | POA: Diagnosis not present

## 2018-05-22 DIAGNOSIS — F3181 Bipolar II disorder: Secondary | ICD-10-CM | POA: Diagnosis not present

## 2018-05-22 DIAGNOSIS — F840 Autistic disorder: Secondary | ICD-10-CM | POA: Diagnosis not present

## 2018-05-25 DIAGNOSIS — F3181 Bipolar II disorder: Secondary | ICD-10-CM | POA: Diagnosis not present

## 2018-05-25 DIAGNOSIS — F840 Autistic disorder: Secondary | ICD-10-CM | POA: Diagnosis not present

## 2018-05-26 DIAGNOSIS — F840 Autistic disorder: Secondary | ICD-10-CM | POA: Diagnosis not present

## 2018-05-26 DIAGNOSIS — E291 Testicular hypofunction: Secondary | ICD-10-CM | POA: Diagnosis not present

## 2018-05-26 DIAGNOSIS — F3181 Bipolar II disorder: Secondary | ICD-10-CM | POA: Diagnosis not present

## 2018-05-29 DIAGNOSIS — F3181 Bipolar II disorder: Secondary | ICD-10-CM | POA: Diagnosis not present

## 2018-05-29 DIAGNOSIS — F840 Autistic disorder: Secondary | ICD-10-CM | POA: Diagnosis not present

## 2018-06-05 DIAGNOSIS — F840 Autistic disorder: Secondary | ICD-10-CM | POA: Diagnosis not present

## 2018-06-05 DIAGNOSIS — F3181 Bipolar II disorder: Secondary | ICD-10-CM | POA: Diagnosis not present

## 2018-06-12 DIAGNOSIS — F840 Autistic disorder: Secondary | ICD-10-CM | POA: Diagnosis not present

## 2018-06-12 DIAGNOSIS — F3181 Bipolar II disorder: Secondary | ICD-10-CM | POA: Diagnosis not present

## 2018-06-16 DIAGNOSIS — E291 Testicular hypofunction: Secondary | ICD-10-CM | POA: Diagnosis not present

## 2018-06-17 DIAGNOSIS — F3181 Bipolar II disorder: Secondary | ICD-10-CM | POA: Diagnosis not present

## 2018-06-17 DIAGNOSIS — F840 Autistic disorder: Secondary | ICD-10-CM | POA: Diagnosis not present

## 2018-06-24 DIAGNOSIS — Z79899 Other long term (current) drug therapy: Secondary | ICD-10-CM | POA: Diagnosis not present

## 2018-06-26 DIAGNOSIS — F3181 Bipolar II disorder: Secondary | ICD-10-CM | POA: Diagnosis not present

## 2018-06-26 DIAGNOSIS — F840 Autistic disorder: Secondary | ICD-10-CM | POA: Diagnosis not present

## 2018-07-07 DIAGNOSIS — E291 Testicular hypofunction: Secondary | ICD-10-CM | POA: Diagnosis not present

## 2018-07-28 DIAGNOSIS — E291 Testicular hypofunction: Secondary | ICD-10-CM | POA: Diagnosis not present

## 2018-08-05 DIAGNOSIS — S0990XA Unspecified injury of head, initial encounter: Secondary | ICD-10-CM

## 2018-08-05 HISTORY — DX: Unspecified injury of head, initial encounter: S09.90XA

## 2018-08-18 DIAGNOSIS — E291 Testicular hypofunction: Secondary | ICD-10-CM | POA: Diagnosis not present

## 2018-09-08 DIAGNOSIS — E291 Testicular hypofunction: Secondary | ICD-10-CM | POA: Diagnosis not present

## 2018-09-29 DIAGNOSIS — E291 Testicular hypofunction: Secondary | ICD-10-CM | POA: Diagnosis not present

## 2019-01-01 DIAGNOSIS — E291 Testicular hypofunction: Secondary | ICD-10-CM | POA: Diagnosis not present

## 2019-01-27 DIAGNOSIS — E291 Testicular hypofunction: Secondary | ICD-10-CM | POA: Diagnosis not present

## 2019-02-17 DIAGNOSIS — E291 Testicular hypofunction: Secondary | ICD-10-CM | POA: Diagnosis not present

## 2019-03-10 DIAGNOSIS — R200 Anesthesia of skin: Secondary | ICD-10-CM | POA: Diagnosis not present

## 2019-03-10 DIAGNOSIS — Z23 Encounter for immunization: Secondary | ICD-10-CM | POA: Diagnosis not present

## 2019-03-10 DIAGNOSIS — E291 Testicular hypofunction: Secondary | ICD-10-CM | POA: Diagnosis not present

## 2019-03-18 DIAGNOSIS — R200 Anesthesia of skin: Secondary | ICD-10-CM | POA: Diagnosis not present

## 2019-03-31 DIAGNOSIS — E291 Testicular hypofunction: Secondary | ICD-10-CM | POA: Diagnosis not present

## 2019-04-15 DIAGNOSIS — R200 Anesthesia of skin: Secondary | ICD-10-CM | POA: Diagnosis not present

## 2019-04-21 DIAGNOSIS — E291 Testicular hypofunction: Secondary | ICD-10-CM | POA: Diagnosis not present

## 2019-05-12 DIAGNOSIS — E291 Testicular hypofunction: Secondary | ICD-10-CM | POA: Diagnosis not present

## 2019-06-02 DIAGNOSIS — E291 Testicular hypofunction: Secondary | ICD-10-CM | POA: Diagnosis not present

## 2019-06-23 DIAGNOSIS — E291 Testicular hypofunction: Secondary | ICD-10-CM | POA: Diagnosis not present

## 2019-07-14 DIAGNOSIS — E291 Testicular hypofunction: Secondary | ICD-10-CM | POA: Diagnosis not present

## 2019-07-15 DIAGNOSIS — Z0000 Encounter for general adult medical examination without abnormal findings: Secondary | ICD-10-CM | POA: Diagnosis not present

## 2019-07-15 DIAGNOSIS — F845 Asperger's syndrome: Secondary | ICD-10-CM | POA: Diagnosis not present

## 2019-07-15 DIAGNOSIS — K589 Irritable bowel syndrome without diarrhea: Secondary | ICD-10-CM | POA: Diagnosis not present

## 2019-07-15 DIAGNOSIS — F319 Bipolar disorder, unspecified: Secondary | ICD-10-CM | POA: Diagnosis not present

## 2019-07-15 DIAGNOSIS — I10 Essential (primary) hypertension: Secondary | ICD-10-CM | POA: Diagnosis not present

## 2019-07-15 DIAGNOSIS — Z5181 Encounter for therapeutic drug level monitoring: Secondary | ICD-10-CM | POA: Diagnosis not present

## 2019-07-15 DIAGNOSIS — E291 Testicular hypofunction: Secondary | ICD-10-CM | POA: Diagnosis not present

## 2019-07-15 DIAGNOSIS — E039 Hypothyroidism, unspecified: Secondary | ICD-10-CM | POA: Diagnosis not present

## 2019-07-22 DIAGNOSIS — F319 Bipolar disorder, unspecified: Secondary | ICD-10-CM | POA: Diagnosis not present

## 2019-07-22 DIAGNOSIS — I10 Essential (primary) hypertension: Secondary | ICD-10-CM | POA: Diagnosis not present

## 2019-07-22 DIAGNOSIS — E039 Hypothyroidism, unspecified: Secondary | ICD-10-CM | POA: Diagnosis not present

## 2019-07-22 DIAGNOSIS — E291 Testicular hypofunction: Secondary | ICD-10-CM | POA: Diagnosis not present

## 2019-07-22 DIAGNOSIS — Z5181 Encounter for therapeutic drug level monitoring: Secondary | ICD-10-CM | POA: Diagnosis not present

## 2019-08-04 DIAGNOSIS — E291 Testicular hypofunction: Secondary | ICD-10-CM | POA: Diagnosis not present

## 2019-08-25 DIAGNOSIS — I10 Essential (primary) hypertension: Secondary | ICD-10-CM | POA: Diagnosis not present

## 2019-08-25 DIAGNOSIS — E291 Testicular hypofunction: Secondary | ICD-10-CM | POA: Diagnosis not present

## 2019-09-15 DIAGNOSIS — E291 Testicular hypofunction: Secondary | ICD-10-CM | POA: Diagnosis not present

## 2019-10-06 DIAGNOSIS — E291 Testicular hypofunction: Secondary | ICD-10-CM | POA: Diagnosis not present

## 2019-10-26 DIAGNOSIS — I10 Essential (primary) hypertension: Secondary | ICD-10-CM | POA: Diagnosis not present

## 2019-10-26 DIAGNOSIS — E291 Testicular hypofunction: Secondary | ICD-10-CM | POA: Diagnosis not present

## 2019-11-16 DIAGNOSIS — E291 Testicular hypofunction: Secondary | ICD-10-CM | POA: Diagnosis not present

## 2019-11-18 ENCOUNTER — Ambulatory Visit: Payer: Self-pay | Attending: Internal Medicine

## 2019-11-18 DIAGNOSIS — Z23 Encounter for immunization: Secondary | ICD-10-CM

## 2019-11-18 NOTE — Progress Notes (Signed)
   Covid-19 Vaccination Clinic  Name:  George [name]    MRN: [id number] DOB: 01-01-1971  11/18/2019  Mr. George [name] was observed post Covid-19 immunization for 15 minutes without incident. He was provided with Vaccine Information Sheet and instruction to access the V-Safe system.   Mr. George [name] was instructed to call 911 with any severe reactions post vaccine: Marland Kitchen Difficulty breathing  . Swelling of face and throat  . A fast heartbeat  . A bad rash all over body  . Dizziness and weakness   Immunizations Administered    Name Date Dose VIS Date Route   Pfizer COVID-19 Vaccine 11/18/2019  8:23 AM 0.3 mL 07/16/2019 Intramuscular   Manufacturer: [place]   Lot: [id number]   [place]: [id number]

## 2019-12-07 DIAGNOSIS — E291 Testicular hypofunction: Secondary | ICD-10-CM | POA: Diagnosis not present

## 2019-12-13 ENCOUNTER — Ambulatory Visit: Payer: Self-pay | Attending: Internal Medicine

## 2019-12-13 DIAGNOSIS — Z23 Encounter for immunization: Secondary | ICD-10-CM

## 2019-12-13 NOTE — Progress Notes (Signed)
   Covid-19 Vaccination Clinic  Name:  George [name]    MRN: [id number] DOB: 1970/09/28  12/13/2019  George [name] was observed post Covid-19 immunization for 15 minutes without incident. He was provided with Vaccine Information Sheet and instruction to access the V-Safe system.   George [name] was instructed to call 911 with any severe reactions post vaccine: Marland Kitchen Difficulty breathing  . Swelling of face and throat  . A fast heartbeat  . A bad rash all over body  . Dizziness and weakness   Immunizations Administered    Name Date Dose VIS Date Route   Pfizer COVID-19 Vaccine 12/13/2019  8:16 AM 0.3 mL 09/29/2018 Intramuscular   Manufacturer: [place]   Lot: [id number]   [place]: [id number]

## 2019-12-28 DIAGNOSIS — E291 Testicular hypofunction: Secondary | ICD-10-CM | POA: Diagnosis not present

## 2020-01-05 DIAGNOSIS — F3176 Bipolar disorder, in full remission, most recent episode depressed: Secondary | ICD-10-CM | POA: Diagnosis not present

## 2020-01-05 DIAGNOSIS — F3174 Bipolar disorder, in full remission, most recent episode manic: Secondary | ICD-10-CM | POA: Diagnosis not present

## 2020-01-05 DIAGNOSIS — F900 Attention-deficit hyperactivity disorder, predominantly inattentive type: Secondary | ICD-10-CM | POA: Diagnosis not present

## 2020-01-18 DIAGNOSIS — E291 Testicular hypofunction: Secondary | ICD-10-CM | POA: Diagnosis not present

## 2020-02-08 DIAGNOSIS — E291 Testicular hypofunction: Secondary | ICD-10-CM | POA: Diagnosis not present

## 2020-02-28 DIAGNOSIS — I10 Essential (primary) hypertension: Secondary | ICD-10-CM | POA: Diagnosis not present

## 2020-02-28 DIAGNOSIS — E039 Hypothyroidism, unspecified: Secondary | ICD-10-CM | POA: Diagnosis not present

## 2020-02-28 DIAGNOSIS — F319 Bipolar disorder, unspecified: Secondary | ICD-10-CM | POA: Diagnosis not present

## 2020-02-29 DIAGNOSIS — E291 Testicular hypofunction: Secondary | ICD-10-CM | POA: Diagnosis not present

## 2020-03-17 ENCOUNTER — Other Ambulatory Visit: Payer: Self-pay | Admitting: Internal Medicine

## 2020-03-17 DIAGNOSIS — R531 Weakness: Secondary | ICD-10-CM | POA: Diagnosis not present

## 2020-03-21 DIAGNOSIS — E291 Testicular hypofunction: Secondary | ICD-10-CM | POA: Diagnosis not present

## 2020-04-12 ENCOUNTER — Other Ambulatory Visit: Payer: Medicare Other

## 2020-04-13 DIAGNOSIS — E291 Testicular hypofunction: Secondary | ICD-10-CM | POA: Diagnosis not present

## 2020-05-01 ENCOUNTER — Other Ambulatory Visit (HOSPITAL_COMMUNITY): Payer: Self-pay | Admitting: Internal Medicine

## 2020-05-01 DIAGNOSIS — R531 Weakness: Secondary | ICD-10-CM

## 2020-05-04 DIAGNOSIS — E291 Testicular hypofunction: Secondary | ICD-10-CM | POA: Diagnosis not present

## 2020-05-13 ENCOUNTER — Other Ambulatory Visit (HOSPITAL_COMMUNITY)
Admission: RE | Admit: 2020-05-13 | Discharge: 2020-05-13 | Disposition: A | Discharge status: Home / Self Care | Payer: PPO | Source: Ambulatory Visit | Attending: Internal Medicine | Admitting: Internal Medicine

## 2020-05-13 DIAGNOSIS — Z20822 Contact with and (suspected) exposure to covid-19: Secondary | ICD-10-CM | POA: Diagnosis not present

## 2020-05-13 DIAGNOSIS — Z01812 Encounter for preprocedural laboratory examination: Secondary | ICD-10-CM | POA: Diagnosis not present

## 2020-05-13 LAB — SARS CORONAVIRUS 2 (TAT 6-24 HRS): SARS Coronavirus 2: NEGATIVE

## 2020-05-15 ENCOUNTER — Encounter (HOSPITAL_COMMUNITY): Payer: Self-pay | Admitting: *Deleted

## 2020-05-15 ENCOUNTER — Other Ambulatory Visit: Payer: Self-pay

## 2020-05-15 DIAGNOSIS — I10 Essential (primary) hypertension: Secondary | ICD-10-CM | POA: Diagnosis not present

## 2020-05-15 DIAGNOSIS — Z01818 Encounter for other preprocedural examination: Secondary | ICD-10-CM | POA: Diagnosis not present

## 2020-05-15 NOTE — Progress Notes (Signed)
H/P came from office.  George [name] denies chest pain or shortness of breath. Patient tested negative for Covid_10/9/21_ and has been in quarantine since that time.

## 2020-05-15 NOTE — Progress Notes (Signed)
No recent H&P in Epic, paper work sent by Dr. Delene Ruffini office does not contain a H&P. I have called Dr. Delene Ruffini office and left message on his nurse's voicemail that we need a H&P done within the last 30 days.

## 2020-05-16 ENCOUNTER — Ambulatory Visit (HOSPITAL_COMMUNITY)
Admission: RE | Admit: 2020-05-16 | Discharge: 2020-05-16 | Disposition: A | Discharge status: Home / Self Care | Payer: PPO | Source: Ambulatory Visit | Attending: Internal Medicine | Admitting: Internal Medicine

## 2020-05-16 ENCOUNTER — Other Ambulatory Visit: Payer: Self-pay

## 2020-05-16 ENCOUNTER — Ambulatory Visit (HOSPITAL_COMMUNITY): Payer: PPO | Admitting: Certified Registered Nurse Anesthetist

## 2020-05-16 ENCOUNTER — Encounter (HOSPITAL_COMMUNITY)
Admission: RE | Disposition: A | Discharge status: Home / Self Care | Payer: Self-pay | Source: Home / Self Care | Attending: Internal Medicine

## 2020-05-16 ENCOUNTER — Encounter (HOSPITAL_COMMUNITY): Payer: Self-pay

## 2020-05-16 ENCOUNTER — Ambulatory Visit (HOSPITAL_COMMUNITY)
Admission: RE | Admit: 2020-05-16 | Discharge: 2020-05-16 | Disposition: A | Discharge status: Home / Self Care | Payer: PPO | Attending: Internal Medicine | Admitting: Internal Medicine

## 2020-05-16 DIAGNOSIS — Z886 Allergy status to analgesic agent status: Secondary | ICD-10-CM | POA: Insufficient documentation

## 2020-05-16 DIAGNOSIS — K219 Gastro-esophageal reflux disease without esophagitis: Secondary | ICD-10-CM | POA: Insufficient documentation

## 2020-05-16 DIAGNOSIS — E039 Hypothyroidism, unspecified: Secondary | ICD-10-CM | POA: Insufficient documentation

## 2020-05-16 DIAGNOSIS — I10 Essential (primary) hypertension: Secondary | ICD-10-CM | POA: Diagnosis not present

## 2020-05-16 DIAGNOSIS — G9389 Other specified disorders of brain: Secondary | ICD-10-CM | POA: Diagnosis not present

## 2020-05-16 DIAGNOSIS — J341 Cyst and mucocele of nose and nasal sinus: Secondary | ICD-10-CM | POA: Diagnosis not present

## 2020-05-16 DIAGNOSIS — R9082 White matter disease, unspecified: Secondary | ICD-10-CM | POA: Diagnosis not present

## 2020-05-16 DIAGNOSIS — Z79899 Other long term (current) drug therapy: Secondary | ICD-10-CM | POA: Diagnosis not present

## 2020-05-16 DIAGNOSIS — Z888 Allergy status to other drugs, medicaments and biological substances status: Secondary | ICD-10-CM | POA: Diagnosis not present

## 2020-05-16 DIAGNOSIS — F319 Bipolar disorder, unspecified: Secondary | ICD-10-CM | POA: Insufficient documentation

## 2020-05-16 DIAGNOSIS — R531 Weakness: Secondary | ICD-10-CM | POA: Insufficient documentation

## 2020-05-16 DIAGNOSIS — F419 Anxiety disorder, unspecified: Secondary | ICD-10-CM | POA: Insufficient documentation

## 2020-05-16 DIAGNOSIS — F845 Asperger's syndrome: Secondary | ICD-10-CM | POA: Diagnosis not present

## 2020-05-16 HISTORY — DX: Hypothyroidism, unspecified: E03.9

## 2020-05-16 HISTORY — DX: Essential (primary) hypertension: I10

## 2020-05-16 HISTORY — DX: Irritable bowel syndrome, unspecified: K58.9

## 2020-05-16 HISTORY — DX: Asperger's syndrome: F84.5

## 2020-05-16 HISTORY — PX: RADIOLOGY WITH ANESTHESIA: SHX6223

## 2020-05-16 LAB — BASIC METABOLIC PANEL
Anion gap: 11 (ref 5–15)
BUN: 17 mg/dL (ref 6–20)
CO2: 23 mmol/L (ref 22–32)
Calcium: 9.4 mg/dL (ref 8.9–10.3)
Chloride: 105 mmol/L (ref 98–111)
Creatinine, Ser: 1.26 mg/dL — ABNORMAL HIGH (ref 0.61–1.24)
GFR, Estimated: >60 mL/min (ref >60)
Glucose, Bld: 77 mg/dL (ref 70–99)
Potassium: 4 mmol/L (ref 3.5–5.1)
Sodium: 139 mmol/L (ref 135–145)

## 2020-05-16 SURGERY — MRI WITH ANESTHESIA
Anesthesia: General

## 2020-05-16 MED ORDER — LACTATED RINGERS IV SOLN: INTRAVENOUS | Status: DC | PRN

## 2020-05-16 MED ORDER — ONDANSETRON HCL 4 MG/2ML IJ SOLN
INTRAMUSCULAR | Status: DC | PRN
  Administered 2020-05-16: 4 mg via INTRAVENOUS

## 2020-05-16 MED ORDER — GADOBUTROL 1 MMOL/ML IV SOLN
7.0000 mL | Freq: Once | INTRAVENOUS | Status: AC | PRN
  Administered 2020-05-16: 7 mL via INTRAVENOUS

## 2020-05-16 MED ORDER — PHENYLEPHRINE HCL (PRESSORS) 10 MG/ML IV SOLN
INTRAVENOUS | Status: DC | PRN
  Administered 2020-05-16 (×2): 80 ug via INTRAVENOUS

## 2020-05-16 MED ORDER — ROCURONIUM BROMIDE 10 MG/ML (PF) SYRINGE
PREFILLED_SYRINGE | INTRAVENOUS | Status: DC | PRN
  Administered 2020-05-16: 40 mg via INTRAVENOUS

## 2020-05-16 MED ORDER — DEXAMETHASONE SODIUM PHOSPHATE 10 MG/ML IJ SOLN
INTRAMUSCULAR | Status: DC | PRN
  Administered 2020-05-16: 5 mg via INTRAVENOUS

## 2020-05-16 MED ORDER — LACTATED RINGERS IV SOLN: INTRAVENOUS | Status: DC

## 2020-05-16 MED ORDER — PROPOFOL 10 MG/ML IV BOLUS
INTRAVENOUS | Status: DC | PRN
  Administered 2020-05-16: 100 mg via INTRAVENOUS
  Administered 2020-05-16: 200 mg via INTRAVENOUS

## 2020-05-16 MED ORDER — LIDOCAINE 2% (20 MG/ML) 5 ML SYRINGE
INTRAMUSCULAR | Status: DC | PRN
  Administered 2020-05-16: 60 mg via INTRAVENOUS

## 2020-05-16 MED ORDER — ORAL CARE MOUTH RINSE: 15.0000 mL | Freq: Once | OROMUCOSAL | Status: AC

## 2020-05-16 MED ORDER — CHLORHEXIDINE GLUCONATE 0.12 % MT SOLN
15.0000 mL | Freq: Once | OROMUCOSAL | Status: AC
  Administered 2020-05-16: 15 mL via OROMUCOSAL
  Filled 2020-05-16: qty 15

## 2020-05-16 NOTE — Transfer of Care (Signed)
Immediate Anesthesia Transfer of Care Note  Patient: George [name]  Procedure(s) Performed: MRI BRAIN WITH AND WITHOUT CONTRAST (N/A )  Patient Location: PACU  Anesthesia Type:General  Level of Consciousness: awake, alert , oriented, patient cooperative and responds to stimulation  Airway & Oxygen Therapy: Patient Spontanous Breathing  Post-op Assessment: Report given to RN, Post -op Vital signs reviewed and stable and Patient moving all extremities X 4  Post vital signs: Reviewed and stable  Last Vitals:  Vitals Value Taken Time  BP 142/94 05/16/20 1131  Temp    Pulse 75 05/16/20 1134  Resp 16 05/16/20 1134  SpO2 97 % 05/16/20 1134  Vitals shown include unvalidated device data.  Last Pain:  Vitals:   05/16/20 0804  TempSrc:   PainSc: 0-No pain         Complications: No complications documented.

## 2020-05-16 NOTE — Anesthesia Preprocedure Evaluation (Signed)
Anesthesia Evaluation  Patient identified by MRN, date of birth, ID band Patient awake    Reviewed: Allergy & Precautions, NPO status , Patient's Chart, lab work & pertinent test results  Airway Mallampati: II  TM Distance: >3 FB Neck ROM: Full    Dental  (+) Dental Advisory Given   Pulmonary neg pulmonary ROS,    breath sounds clear to auscultation       Cardiovascular hypertension, Pt. on medications  Rhythm:Regular Rate:Normal     Neuro/Psych PSYCHIATRIC DISORDERS Anxiety Bipolar Disorder negative neurological ROS     GI/Hepatic negative GI ROS, Neg liver ROS,   Endo/Other  Hypothyroidism   Renal/GU negative Renal ROS     Musculoskeletal   Abdominal   Peds  Hematology negative hematology ROS (+)   Anesthesia Other Findings   Reproductive/Obstetrics                             Anesthesia Physical Anesthesia Plan  ASA: II  Anesthesia Plan: General   Post-op Pain Management:    Induction: Intravenous  PONV Risk Score and Plan: 2 and Dexamethasone, Ondansetron and Treatment may vary due to age or medical condition  Airway Management Planned: LMA and Oral ETT  Additional Equipment:   Intra-op Plan:   Post-operative Plan: Extubation in OR  Informed Consent: I have reviewed the patients History and Physical, chart, labs and discussed the procedure including the risks, benefits and alternatives for the proposed anesthesia with the patient or authorized representative who has indicated his/her understanding and acceptance.     Dental advisory given  Plan Discussed with: CRNA  Anesthesia Plan Comments:         Anesthesia Quick Evaluation

## 2020-05-16 NOTE — Anesthesia Procedure Notes (Signed)
Procedure Name: Intubation Performed by: Suzette Battiest, MD Pre-anesthesia Checklist: Patient identified, Emergency Drugs available, Suction available and Patient being monitored Patient Re-evaluated:Patient Re-evaluated prior to induction Oxygen Delivery Method: Circle system utilized Preoxygenation: Pre-oxygenation with 100% oxygen Induction Type: IV induction Ventilation: Mask ventilation without difficulty Laryngoscope Size: Miller and 2 Grade View: Grade II Tube type: Oral Tube size: 7.5 mm Number of attempts: 1 Airway Equipment and Method: Stylet and Oral airway Placement Confirmation: ETT inserted through vocal cords under direct vision,  positive ETCO2 and breath sounds checked- equal and bilateral Secured at: 23 cm Tube secured with: Tape Dental Injury: Teeth and Oropharynx as per pre-operative assessment

## 2020-05-16 NOTE — Progress Notes (Signed)
MRI form faxed to Radiology Department 

## 2020-05-17 ENCOUNTER — Encounter (HOSPITAL_COMMUNITY): Payer: Self-pay | Admitting: Radiology

## 2020-05-18 NOTE — Anesthesia Postprocedure Evaluation (Signed)
Anesthesia Post Note  Patient: George [name]  Procedure(s) Performed: MRI BRAIN WITH AND WITHOUT CONTRAST (N/A )     Patient location during evaluation: PACU Anesthesia Type: General Level of consciousness: awake and alert Pain management: pain level controlled Vital Signs Assessment: post-procedure vital signs reviewed and stable Respiratory status: spontaneous breathing, nonlabored ventilation, respiratory function stable and patient connected to nasal cannula oxygen Cardiovascular status: blood pressure returned to baseline and stable Postop Assessment: no apparent nausea or vomiting Anesthetic complications: no   No complications documented.  Last Vitals:  Vitals:   05/16/20 1145 05/16/20 1200  BP: (!) 143/89 120/87  Pulse: 73   Resp: 15   Temp:    SpO2: 98% 98%    Last Pain:  Vitals:   05/16/20 1200  TempSrc:   PainSc: 0-No pain                 Tiajuana Amass

## 2020-05-18 NOTE — Addendum Note (Signed)
Addendum  created 05/18/20 1022 by Yomaira Solar, Dahlia Bailiff, CRNA   Intraprocedure Event edited

## 2020-05-25 DIAGNOSIS — E291 Testicular hypofunction: Secondary | ICD-10-CM | POA: Diagnosis not present

## 2020-06-15 DIAGNOSIS — E039 Hypothyroidism, unspecified: Secondary | ICD-10-CM | POA: Diagnosis not present

## 2020-06-23 DIAGNOSIS — E039 Hypothyroidism, unspecified: Secondary | ICD-10-CM | POA: Diagnosis not present

## 2020-06-23 DIAGNOSIS — F319 Bipolar disorder, unspecified: Secondary | ICD-10-CM | POA: Diagnosis not present

## 2020-06-23 DIAGNOSIS — I10 Essential (primary) hypertension: Secondary | ICD-10-CM | POA: Diagnosis not present

## 2020-06-23 DIAGNOSIS — K219 Gastro-esophageal reflux disease without esophagitis: Secondary | ICD-10-CM | POA: Diagnosis not present

## 2020-06-23 DIAGNOSIS — E782 Mixed hyperlipidemia: Secondary | ICD-10-CM | POA: Diagnosis not present

## 2020-06-23 DIAGNOSIS — E785 Hyperlipidemia, unspecified: Secondary | ICD-10-CM | POA: Diagnosis not present

## 2020-07-06 DIAGNOSIS — Z1329 Encounter for screening for other suspected endocrine disorder: Secondary | ICD-10-CM | POA: Diagnosis not present

## 2020-07-06 DIAGNOSIS — F3176 Bipolar disorder, in full remission, most recent episode depressed: Secondary | ICD-10-CM | POA: Diagnosis not present

## 2020-07-06 DIAGNOSIS — Z130 Encounter for screening for diseases of the blood and blood-forming organs and certain disorders involving the immune mechanism: Secondary | ICD-10-CM | POA: Diagnosis not present

## 2020-07-06 DIAGNOSIS — F3174 Bipolar disorder, in full remission, most recent episode manic: Secondary | ICD-10-CM | POA: Diagnosis not present

## 2020-07-06 DIAGNOSIS — E785 Hyperlipidemia, unspecified: Secondary | ICD-10-CM | POA: Diagnosis not present

## 2020-07-06 DIAGNOSIS — E291 Testicular hypofunction: Secondary | ICD-10-CM | POA: Diagnosis not present

## 2020-07-06 DIAGNOSIS — Z79899 Other long term (current) drug therapy: Secondary | ICD-10-CM | POA: Diagnosis not present

## 2020-07-14 DIAGNOSIS — E782 Mixed hyperlipidemia: Secondary | ICD-10-CM | POA: Diagnosis not present

## 2020-07-18 DIAGNOSIS — E291 Testicular hypofunction: Secondary | ICD-10-CM | POA: Diagnosis not present

## 2020-07-18 DIAGNOSIS — F845 Asperger's syndrome: Secondary | ICD-10-CM | POA: Diagnosis not present

## 2020-07-18 DIAGNOSIS — E039 Hypothyroidism, unspecified: Secondary | ICD-10-CM | POA: Diagnosis not present

## 2020-07-18 DIAGNOSIS — I10 Essential (primary) hypertension: Secondary | ICD-10-CM | POA: Diagnosis not present

## 2020-07-18 DIAGNOSIS — Z1159 Encounter for screening for other viral diseases: Secondary | ICD-10-CM | POA: Diagnosis not present

## 2020-07-18 DIAGNOSIS — Z0000 Encounter for general adult medical examination without abnormal findings: Secondary | ICD-10-CM | POA: Diagnosis not present

## 2020-07-18 DIAGNOSIS — F319 Bipolar disorder, unspecified: Secondary | ICD-10-CM | POA: Diagnosis not present

## 2020-07-18 DIAGNOSIS — R9389 Abnormal findings on diagnostic imaging of other specified body structures: Secondary | ICD-10-CM | POA: Diagnosis not present

## 2020-07-19 ENCOUNTER — Encounter: Payer: Self-pay | Admitting: *Deleted

## 2020-07-25 ENCOUNTER — Ambulatory Visit: Payer: PPO | Admitting: Diagnostic Neuroimaging

## 2020-07-25 ENCOUNTER — Encounter: Payer: Self-pay | Admitting: Diagnostic Neuroimaging

## 2020-07-25 VITALS — BP 166/101 | HR 85 | Ht 68.0 in | Wt 156.2 lb

## 2020-07-25 DIAGNOSIS — R531 Weakness: Secondary | ICD-10-CM

## 2020-07-25 DIAGNOSIS — R9089 Other abnormal findings on diagnostic imaging of central nervous system: Secondary | ICD-10-CM

## 2020-07-25 NOTE — Patient Instructions (Addendum)
-   check MRI cervical and thoracic spine  - then possible labs and spinal tap (LP)

## 2020-07-25 NOTE — Progress Notes (Signed)
[facility]  PATIENT: George [name] DOB: [id number]  REFERRING CLINICIAN: Lavone Orn, MD HISTORY FROM: patient  REASON FOR VISIT: new consult    HISTORICAL  CHIEF COMPLAINT:  Chief Complaint  Patient presents with  . Extremity Weakness    RM 7 With parents Pt states mostly right sided weakness and drooling     HISTORY OF PRESENT ILLNESS:   49 year old male here for evaluation of abnormal MRI.  Prior notes reviewed.  Patient has long history of ADHD, bipolar disorder, low testosterone, Asperger's syndrome, was in [place] and getting treatment by psychiatry and endocrinology for low testosterone, fatigue and low libido.  MRI of the brain was ordered to rule out pituitary abnormality, however multiple white matter lesions were noted.  Patient followed up with neurology in [place], Dr. Erling Cruz, who followed serial MRI scans, spinal tap and lab testing.  Apparently CSF analysis was negative for oligoclonal banding.  MRIs were stable and therefore multiple sclerosis was ruled out.  Last evaluation by Dr. Erling Cruz was in 2012.  He followed up with nurse practitioner Charlott Holler in 2014.  Patient was stable for several years until August 2021.  He noticed new onset of right face, right arm and right leg weakness.  Patient has started to develop some slurred speech.  Patient discussed with PCP, had new MRI of the brain ordered which showed some progression of white matter lesions.  No abnormal enhancing lesions were noted.  Therefore patient referred here for further evaluation.   PRIOR HPI (03/19/13 note, Charlott Holler): "49 year old ambidextrous white single male with a history of bipolar affective disease diagnosed at age 68 by Dr. Ebony Hail in [place], [place]. He has been on lithium 300 mg t.i.d. and Depakote ER 500, 500, 250  for 1250 mg ER once per day, but t is currently off of  lithium and also tapering Prozac to 10 mg 3/4 per day.He is on Synthroid 0.075 mg daily,  vitamin D 3, and  AndroGel squirts four times daily. When he has been placed on Wellbutrin, he has a   "psychotic break".  He heard persistent sounds such as the echo of a flushing toilet. He had  mood swings. He has a long history of anxiety.He has been followed by Dr. Clovis Riley psychiatrist in [place], [place]. Because of fatigue and decreased sexual drive, he was evaluated in [place] and found to have a low testosterone level. An endocrinologist ordered an MRI of the pituitary which was normal except for white matter lesions or hyper intense signal intensities in the brainstem in the right pons, right cerebellar hemisphere, right medulla, right thalamus. There were several periventricular lesions as well that were thought to represent Dawson's fingers. There was no enhancement.He denies headaches, visual loss,  bowel and bladder dysfunction, or Lhermitte's sign. CBC CMP, TSH , thyroxine, and T3 were normal 05/11/10 and 06/18/10. He believes he has celiac disease and is on a gluten-free diet. He also indicates that he is being evaluated for Aspergers disease.  Workup has included normal neurologic examination 11/12/2010, normal lab tests, B12, Sjgren's antibody, ANA, angiotensin-converting enzyme, and sedimentation rate.  MRI 11/21/2010  showed  abnormal T2 and flair signal throughout the brain, most pronounced in the pons but also elsewhere in the brain stem  There were  multiple patchy areas involving the hemisphere white matter.  The question of demyelinating disease was raised.  CSF evaluation 11/08/2010  showed glucose 60, protein 46, VDRL nonreactive, normal IgG, negative oligoclonal banding, and no  white blood cells or red blood cells. He has no neurologic complaints except tremor. Over the last month he has  headaches which he relates to an anxiety situation with his sister. 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 At times he only gets 4 hours sleep. His  testosterone dosages have been increased.  He denies visual loss, double vision, swallowing problems, Lhermitte's sign, swallowing problems, slurred speech, blackout spells, focal arm or leg weakness, numbness, or bowel or bladder incontinence. Since tapering his psychiatric medications his sex drive has improved. He describes an episode of panic attack when while riding his scooter  he was pulled over by the police. 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  UPDATE 03/19/13 (LL):  Patient returns for 1 year follow up visit, former patient of Dr. Erling Cruz.  No new neurological problems.  No complaints.  Minor difficulty swallowing, patient thinks that possibly it is psychosomatic because Dr. Erling Cruz told him to be watchful for it at last visit.  Does not get choked on food or cough when eating.  Has regular visits with psychiatrist and PCP, Dr. Lavone Orn."   REVIEW OF SYSTEMS: Full 14 system review of systems performed and negative with exception of: as per HPI.   ALLERGIES: Allergies  Allergen Reactions  . Dairy Aid [Lactase]     Can have some, but not large doses  . Wheat Bran Other (See Comments)    IBS    HOME MEDICATIONS: Outpatient Medications Prior to Visit  Medication Sig Dispense Refill  . amLODipine (NORVASC) 5 MG tablet Take 5 mg by mouth daily.    . busPIRone (BUSPAR) 10 MG tablet Take 20 mg by mouth 2 (two) times daily.    . cholecalciferol (VITAMIN D) 1000 units tablet Take 1,000 Units by mouth daily as needed.     . CHOLINE PO Take 300 mg by mouth.    . Cyanocobalamin (VITAMIN B-12 PO) Take by mouth.    . divalproex (DEPAKOTE) 250 MG DR tablet Take 1,250 mg by mouth daily.     Marland Kitchen SYNTHROID 100 MCG tablet Take 100 mcg by mouth daily before breakfast.     . testosterone enanthate (DELATESTRYL) 200 MG/ML injection Inject 50-200 mg into the muscle every 14 (fourteen) days. For IM use only     No facility-administered medications prior to visit.    PAST MEDICAL HISTORY: Past  Medical History:  Diagnosis Date  . Anxiety   . Asperger's syndrome   . Autistic spectrum disorder    Asperger's (per H[facility])  . Bipolar 1 disorder ([place])    "no mood swings in 20 years"  on medication  . GERD (gastroesophageal reflux disease)   . Head injury 2020  . Hypertension   . Hypothyroidism   . IBS (irritable bowel syndrome)     PAST SURGICAL HISTORY: Past Surgical History:  Procedure Laterality Date  . NO PAST SURGERIES    . RADIOLOGY WITH ANESTHESIA N/A 05/16/2020   Procedure: MRI BRAIN WITH AND WITHOUT CONTRAST;  Surgeon: Radiologist, Medication, MD;  Location: [facility];  Service: Radiology;  Laterality: N/A;    FAMILY HISTORY: Family History  Problem Relation Age of Onset  . Hypertension Father   . Depression Sister   . Hypertension Paternal Grandfather     SOCIAL HISTORY: Social History   Socioeconomic History  . Marital status: Single    Spouse name: Not on  file  . Number of children: Not on file  . Years of education: college  . Highest education level: Not on file  Occupational History    Comment: artist  Tobacco Use  . Smoking status: Never Smoker  . Smokeless tobacco: Never Used  Vaping Use  . Vaping Use: Never used  Substance and Sexual Activity  . Alcohol use: No  . Drug use: No  . Sexual activity: Not on file  Other Topics Concern  . Not on file  Social History Narrative   He lives at home. He is single and has no children.  He denies use of tobacco, alcohol, illicit drugs and caffeine. He works as an Training and development officer.    Oncologist: Not on [employer] Insecurity: Not on file  Transportation Needs: Not on file  Physical Activity: Not on file  Stress: Not on file  Social Connections: Not on file  Intimate Partner Violence: Not on file     PHYSICAL EXAM  GENERAL EXAM/CONSTITUTIONAL: Vitals:  Vitals:   07/25/20 1451  BP: (!) 166/101  Pulse: 85  Weight: 156 lb 3.2 oz (70.9 kg)   Height: 5\' 8"  (1.727 m)     Body mass index is 23.75 kg/m. Wt Readings from Last 3 Encounters:  07/25/20 156 lb 3.2 oz (70.9 kg)  05/16/20 156 lb 8 oz (71 kg)  03/04/14 153 lb 3.2 oz (69.5 kg)     Patient is in no distress; well developed, nourished and groomed; neck is supple  CARDIOVASCULAR:  Examination of carotid arteries is normal; no carotid bruits  Regular rate and rhythm, no murmurs  Examination of peripheral vascular system by observation and palpation is normal  EYES:  Ophthalmoscopic exam of optic discs and posterior segments is normal; no papilledema or hemorrhages  No exam data present  MUSCULOSKELETAL:  Gait, strength, tone, movements noted in Neurologic exam below  NEUROLOGIC: MENTAL STATUS:  No flowsheet data found.  awake, alert, oriented to person, place and time  recent and remote memory intact  normal attention and concentration  language fluent, comprehension intact, naming intact  fund of knowledge appropriate  CRANIAL NERVE:   2nd - no papilledema on fundoscopic exam  2nd, 3rd, 4th, 6th - pupils equal and reactive to light, visual fields full to confrontation, extraocular muscles intact, no nystagmus  5th - facial sensation symmetric  7th - facial strength --> DECR RIGHT NL FOLD  8th - hearing intact  9th - palate elevates symmetrically, uvula midline  11th - shoulder shrug symmetric  12th - tongue protrusion midline  MOTOR:   normal bulk and tone, full strength in the BUE, BLE; EXCEPT RUE AND RLE WEAKNESS (4)  SENSORY:   normal and symmetric to light touch, pinprick, temperature, vibration  COORDINATION:   finger-nose-finger, fine finger movements; RIGHT ARM ATAXIA  REFLEXES:   deep tendon reflexes 2+ and symmetric  GAIT/STATION:   narrow based gait; DECR RIGHT ARM SWING; SLIGHTLY STIFF GAIT WITH RIGHT LEG     DIAGNOSTIC DATA (LABS, IMAGING, TESTING) - I reviewed patient records, labs, notes, testing  and imaging myself where available.  Lab Results  Component Value Date   WBC 5.6 03/04/2014   HGB 17.0 03/04/2014   HCT 51.0 03/04/2014   MCV 87.6 03/04/2014      Component Value Date/Time   NA 139 05/16/2020 0745   K 4.0 05/16/2020 0745   CL 105 05/16/2020 0745   CO2 23 05/16/2020 0745  GLUCOSE 77 05/16/2020 0745   BUN 17 05/16/2020 0745   CREATININE 1.26 (H) 05/16/2020 0745   CREATININE 1.14 03/04/2014 1310   CALCIUM 9.4 05/16/2020 0745   PROT 7.4 03/04/2014 1310   ALBUMIN 4.6 03/04/2014 1310   AST 18 03/04/2014 1310   ALT 21 03/04/2014 1310   ALKPHOS 58 03/04/2014 1310   BILITOT 0.5 03/04/2014 1310   GFRNONAA >60 05/16/2020 0745   No results found for: CHOL, HDL, LDLCALC, LDLDIRECT, TRIG, CHOLHDL No results found for: HGBA1C No results found for: VITAMINB12 No results found for: TSH   11/21/10 MRI brain [I reviewed images myself and agree with interpretation. -VRP]  - Foci of abnormal T2 and FLAIR signal throughout the brain, most  pronounced affecting the pons, but also elsewhere within the  brainstem, the middle cerebellar peduncle on the right, the  thalamus on the right and in a minimal patchy fashion affecting  multiple areas of the cerebral hemispheric white matter.  - I think most likely diagnosis would be demyelinating  disease/multiple sclerosis. Conceivably a post viral or  inflammatory process could have this appearance. Lupus can result  in this appearance. Lyme disease is a remote consideration. Post  toxic insult would be a consideration.    05/16/20 MRI brain [I reviewed images myself and agree with interpretation. -VRP]  - Interval progression of white matter disease most suggestive of multiple sclerosis. No abnormal enhancement or acute infarct.     ASSESSMENT AND PLAN  49 y.o. year old male here with new onset of right sided weakness, slurred speech, gait difficulty, with MRI brain suggestive of multiple sclerosis.  We will proceed with  further work-up with MRI cervical and thoracic spine.  May need to repeat labs and spinal tap testing.   Dx:  1. Weakness   2. MRI of brain abnormal     PLAN:  RIGHT SIDED WEAKNESS / ABNORMAL MRI - MRI cervical and thoracic spine - then possible labs and LP  Orders Placed This Encounter  Procedures  . MR CERVICAL SPINE W WO CONTRAST  . MR THORACIC SPINE W WO CONTRAST   Return pending test results.    Penni Bombard, MD 63/08/6008, 9:32 PM Certified in Neurology, Neurophysiology and Neuroimaging  [facility] [address] [place], [place] [address] [phone]

## 2020-07-26 ENCOUNTER — Telehealth: Payer: Self-pay

## 2020-07-26 ENCOUNTER — Telehealth: Payer: Self-pay | Admitting: Diagnostic Neuroimaging

## 2020-07-26 NOTE — Telephone Encounter (Signed)
[facility] team no auth faxed to [facility] to be scheduled for sedation

## 2020-07-27 DIAGNOSIS — E291 Testicular hypofunction: Secondary | ICD-10-CM | POA: Diagnosis not present

## 2020-08-02 ENCOUNTER — Ambulatory Visit: Payer: PPO | Admitting: Diagnostic Neuroimaging

## 2020-08-03 DIAGNOSIS — Z1159 Encounter for screening for other viral diseases: Secondary | ICD-10-CM | POA: Diagnosis not present

## 2020-08-03 DIAGNOSIS — Z125 Encounter for screening for malignant neoplasm of prostate: Secondary | ICD-10-CM | POA: Diagnosis not present

## 2020-08-03 DIAGNOSIS — E291 Testicular hypofunction: Secondary | ICD-10-CM | POA: Diagnosis not present

## 2020-08-03 DIAGNOSIS — Z0000 Encounter for general adult medical examination without abnormal findings: Secondary | ICD-10-CM | POA: Diagnosis not present

## 2020-08-08 ENCOUNTER — Telehealth: Payer: Self-pay | Admitting: *Deleted

## 2020-08-08 NOTE — Telephone Encounter (Signed)
[facility] MRI sedation H&P form on MD desk for completion, signature. Of note, patient was seen 07/25/20. He must be seen < 30 days prior to MRI which is scheduled for 08/29/20.

## 2020-08-08 NOTE — Telephone Encounter (Signed)
Patient sedated MRI is scheduled at [facility] for 08/29/20. I put the H&P form on Nurse Maryclaire desk to be filled out.

## 2020-08-17 DIAGNOSIS — E291 Testicular hypofunction: Secondary | ICD-10-CM | POA: Diagnosis not present

## 2020-08-24 ENCOUNTER — Encounter (HOSPITAL_COMMUNITY): Payer: Self-pay

## 2020-08-24 NOTE — Progress Notes (Signed)
Completed preop phone call with father.  Pt is autistic.  Father denies patient has any chest pain, SOB.  Covid test scheduled for 08/26/20.  Denies DM.  No sleep apnea or cpap use.

## 2020-08-26 ENCOUNTER — Other Ambulatory Visit (HOSPITAL_COMMUNITY)
Admission: RE | Admit: 2020-08-26 | Discharge: 2020-08-26 | Disposition: A | Discharge status: Home / Self Care | Payer: PPO | Source: Ambulatory Visit | Attending: Diagnostic Neuroimaging | Admitting: Diagnostic Neuroimaging

## 2020-08-26 DIAGNOSIS — Z20822 Contact with and (suspected) exposure to covid-19: Secondary | ICD-10-CM | POA: Insufficient documentation

## 2020-08-26 DIAGNOSIS — Z01812 Encounter for preprocedural laboratory examination: Secondary | ICD-10-CM | POA: Insufficient documentation

## 2020-08-26 LAB — SARS CORONAVIRUS 2 (TAT 6-24 HRS): SARS Coronavirus 2: NEGATIVE

## 2020-08-29 ENCOUNTER — Ambulatory Visit (HOSPITAL_COMMUNITY)
Admission: AD | Admit: 2020-08-29 | Discharge: 2020-08-29 | Disposition: A | Discharge status: Home / Self Care | Payer: PPO | Source: Ambulatory Visit | Attending: Diagnostic Neuroimaging | Admitting: Diagnostic Neuroimaging

## 2020-08-29 ENCOUNTER — Encounter (HOSPITAL_COMMUNITY): Payer: Self-pay

## 2020-08-29 ENCOUNTER — Ambulatory Visit (HOSPITAL_COMMUNITY): Payer: PPO | Admitting: Certified Registered"

## 2020-08-29 ENCOUNTER — Ambulatory Visit (HOSPITAL_COMMUNITY): Admission: RE | Admit: 2020-08-29 | Payer: PPO | Source: Ambulatory Visit

## 2020-08-29 ENCOUNTER — Encounter (HOSPITAL_COMMUNITY): Admission: AD | Disposition: A | Discharge status: Home / Self Care | Payer: Self-pay | Source: Ambulatory Visit

## 2020-08-29 ENCOUNTER — Telehealth: Payer: Self-pay | Admitting: *Deleted

## 2020-08-29 ENCOUNTER — Ambulatory Visit (HOSPITAL_COMMUNITY)
Admission: RE | Admit: 2020-08-29 | Discharge: 2020-08-29 | Disposition: A | Discharge status: Home / Self Care | Payer: PPO | Source: Ambulatory Visit | Attending: Diagnostic Neuroimaging | Admitting: Diagnostic Neuroimaging

## 2020-08-29 DIAGNOSIS — D1809 Hemangioma of other sites: Secondary | ICD-10-CM | POA: Diagnosis not present

## 2020-08-29 DIAGNOSIS — M50222 Other cervical disc displacement at C5-C6 level: Secondary | ICD-10-CM | POA: Diagnosis not present

## 2020-08-29 DIAGNOSIS — Z20822 Contact with and (suspected) exposure to covid-19: Secondary | ICD-10-CM | POA: Diagnosis not present

## 2020-08-29 DIAGNOSIS — M47812 Spondylosis without myelopathy or radiculopathy, cervical region: Secondary | ICD-10-CM | POA: Diagnosis not present

## 2020-08-29 DIAGNOSIS — R45851 Suicidal ideations: Secondary | ICD-10-CM

## 2020-08-29 DIAGNOSIS — R9089 Other abnormal findings on diagnostic imaging of central nervous system: Secondary | ICD-10-CM | POA: Diagnosis not present

## 2020-08-29 DIAGNOSIS — M47814 Spondylosis without myelopathy or radiculopathy, thoracic region: Secondary | ICD-10-CM | POA: Insufficient documentation

## 2020-08-29 DIAGNOSIS — R531 Weakness: Secondary | ICD-10-CM | POA: Insufficient documentation

## 2020-08-29 DIAGNOSIS — N281 Cyst of kidney, acquired: Secondary | ICD-10-CM | POA: Insufficient documentation

## 2020-08-29 DIAGNOSIS — G379 Demyelinating disease of central nervous system, unspecified: Secondary | ICD-10-CM | POA: Diagnosis not present

## 2020-08-29 HISTORY — PX: RADIOLOGY WITH ANESTHESIA: SHX6223

## 2020-08-29 LAB — BASIC METABOLIC PANEL
Anion gap: 7 (ref 5–15)
BUN: 11 mg/dL (ref 6–20)
CO2: 29 mmol/L (ref 22–32)
Calcium: 9.2 mg/dL (ref 8.9–10.3)
Chloride: 103 mmol/L (ref 98–111)
Creatinine, Ser: 1.18 mg/dL (ref 0.61–1.24)
GFR, Estimated: >60 mL/min (ref >60)
Glucose, Bld: 88 mg/dL (ref 70–99)
Potassium: 4.1 mmol/L (ref 3.5–5.1)
Sodium: 139 mmol/L (ref 135–145)

## 2020-08-29 LAB — SARS CORONAVIRUS 2 BY RT PCR (HOSPITAL ORDER, PERFORMED IN ~~LOC~~ HOSPITAL LAB): SARS Coronavirus 2: NEGATIVE

## 2020-08-29 SURGERY — MRI WITH ANESTHESIA
Anesthesia: General

## 2020-08-29 MED ORDER — DEXAMETHASONE SODIUM PHOSPHATE 10 MG/ML IJ SOLN
INTRAMUSCULAR | Status: DC | PRN
  Administered 2020-08-29: 10 mg via INTRAVENOUS

## 2020-08-29 MED ORDER — MIDAZOLAM HCL 2 MG/2ML IJ SOLN
INTRAMUSCULAR | Status: AC
  Filled 2020-08-29: qty 2

## 2020-08-29 MED ORDER — PHENYLEPHRINE HCL-NACL 10-0.9 MG/250ML-% IV SOLN
INTRAVENOUS | Status: DC | PRN
  Administered 2020-08-29: 15 ug/min via INTRAVENOUS

## 2020-08-29 MED ORDER — LACTATED RINGERS IV SOLN: INTRAVENOUS | Status: DC

## 2020-08-29 MED ORDER — ONDANSETRON HCL 4 MG/2ML IJ SOLN
INTRAMUSCULAR | Status: DC | PRN
  Administered 2020-08-29: 4 mg via INTRAVENOUS

## 2020-08-29 MED ORDER — ORAL CARE MOUTH RINSE: 15.0000 mL | Freq: Once | OROMUCOSAL | Status: AC

## 2020-08-29 MED ORDER — ONDANSETRON HCL 4 MG/2ML IJ SOLN: 4.0000 mg | Freq: Once | INTRAMUSCULAR | Status: DC | PRN

## 2020-08-29 MED ORDER — PROPOFOL 10 MG/ML IV BOLUS
INTRAVENOUS | Status: DC | PRN
  Administered 2020-08-29: 300 mg via INTRAVENOUS

## 2020-08-29 MED ORDER — CHLORHEXIDINE GLUCONATE 0.12 % MT SOLN
15.0000 mL | Freq: Once | OROMUCOSAL | Status: AC
  Administered 2020-08-29: 15 mL via OROMUCOSAL
  Filled 2020-08-29: qty 15

## 2020-08-29 MED ORDER — LIDOCAINE 2% (20 MG/ML) 5 ML SYRINGE
INTRAMUSCULAR | Status: DC | PRN
  Administered 2020-08-29: 60 mg via INTRAVENOUS

## 2020-08-29 MED ORDER — FENTANYL CITRATE (PF) 100 MCG/2ML IJ SOLN
INTRAMUSCULAR | Status: AC
  Filled 2020-08-29: qty 2

## 2020-08-29 MED ORDER — FENTANYL CITRATE (PF) 250 MCG/5ML IJ SOLN
INTRAMUSCULAR | Status: DC | PRN
  Administered 2020-08-29: 100 ug via INTRAVENOUS

## 2020-08-29 MED ORDER — GADOBUTROL 1 MMOL/ML IV SOLN
7.0000 mL | Freq: Once | INTRAVENOUS | Status: AC | PRN
  Administered 2020-08-29: 7 mL via INTRAVENOUS

## 2020-08-29 NOTE — Transfer of Care (Signed)
Immediate Anesthesia Transfer of Care Note  Patient: George [name]  Procedure(s) Performed: MRI WITH ANESTHESIA CERVICAL SPINE WITH AND WITHOUT CONTRAST, THORACIC SPINE WITH AND WITHOUT CONTRAST (N/A )  Patient Location: PACU  Anesthesia Type:General  Level of Consciousness: awake, alert , oriented and patient cooperative  Airway & Oxygen Therapy: Patient Spontanous Breathing and Patient connected to nasal cannula oxygen  Post-op Assessment: Report given to RN and Post -op Vital signs reviewed and stable  Post vital signs: Reviewed and stable  Last Vitals:  Vitals Value Taken Time  BP 140/91 08/29/20 1345  Temp 36.8 C 08/29/20 1345  Pulse 76 08/29/20 1351  Resp 16 08/29/20 1351  SpO2 99 % 08/29/20 1351  Vitals shown include unvalidated device data.  Last Pain:  Vitals:   08/29/20 1345  TempSrc:   PainSc: Asleep         Complications: No complications documented.

## 2020-08-29 NOTE — Anesthesia Postprocedure Evaluation (Signed)
Anesthesia Post Note  Patient: CODA MATHEY  Procedure(s) Performed: MRI WITH ANESTHESIA CERVICAL SPINE WITH AND WITHOUT CONTRAST, THORACIC SPINE WITH AND WITHOUT CONTRAST (N/A )     Patient location during evaluation: PACU Anesthesia Type: General Level of consciousness: sedated Pain management: pain level controlled Vital Signs Assessment: post-procedure vital signs reviewed and stable Respiratory status: spontaneous breathing and respiratory function stable Cardiovascular status: stable Postop Assessment: no apparent nausea or vomiting Anesthetic complications: no   No complications documented.  Last Vitals:  Vitals:   08/29/20 1400 08/29/20 1415  BP: 127/89 124/82  Pulse: 77 74  Resp: 17 19  Temp:  36.7 C  SpO2: 100% 100%    Last Pain:  Vitals:   08/29/20 1415  TempSrc:   PainSc: 0-No pain                 Kees Idrovo DANIEL

## 2020-08-29 NOTE — Consult Note (Signed)
Received call from Maralyn Sago, [facility] stating that patient came in for sedated MRI. He was told it would be delayed, and he needed a Covid test. She stated he pulled out his IV, stated he wanted to go home and kill himself.  She stated Social Services is requesting a STAT order for a TTS consult. I advised her will let Dr Leta Baptist know when he arrives in office. Horris Latino verbalized understanding, appreciation.  Psych consult placed for suicidal ideation. Prior to consult this nurse practitioner received a phone call regarding patient endorsing suicidal ideations. He is currently not admitted to the hospital and TTS would not be able to do a STAT consult.  This nurse practitioner requested a order for psych consult to be placed in order to assess patient in short stay admissions.  Patient was seen and chart reviewed by this nurse practitioner.  Per patient he reports having a" meltdown.  1 too many things has happened but I am okay with that now.  My right side is not functioning at about 30%, and no one has been able to give me a diagnosis.  So that is why I am here today to figure out what happened. "  Patient reports her previous psychiatric history of bipolar diagnosis, and anxiety.  He reports he is taking Depakote ER 1250 mg p.o. daily, and BuSpar 20 mg p.o. twice daily.  He also reports a diagnosis of ADHD, Asperger's, and IBS that has impaired his quality of life and impacting his depression.  He reports dysmorphic mood since he was a teenager, that is described as chronic sadness and depression, and has tried multiple failed antidepressant medications to control his depression.  He reports most medication impacts his IBS and or activates his bipolar, so he has chosen not to take any additional antidepressants at this time.  He has been in therapy through [facility] and Ellene Route, and who he sees weekly for CBT and mood disorder.  He also reports he is receiving outpatient psychiatric  services by Dr. Toy Care at [facility].  Patient does endorse some worsening depression, however denies any current suicidal thoughts.  Patient feels very remorseful and regretful, that he wrote a letter to his parents to monitor his moods.  He also feels terrible that his mother is going to read the letter while sitting out in the lobby while he has his procedure performed.  Patient would not elaborate on the letter and its findings, however he does state he was just in deep moment.  He denies any previous suicide attempts.  He reports 1 previous inpatient admission as a child at [facility].  He denies any substance abuse.  Patient at this time denies any suicidal ideation, and is able to contract for safety.  He is encouraged to follow-up with Dr. Robina Ade regarding his worsening depressive thoughts.  He is advised there are multiple medications that do not contain serotonin the impact his IBS, and secondly can improve his depressive symptoms and dysphoric mood.  He is advised that he is at high risk for suicide completion, in which he verbalizes understanding.  He states he has not attempted suicide before and will not do so in the future.  He states he would like to work on expressing himself a little better when he gets in those deep moments.  He also reports he has been 3 weeks without seeing his therapist Ellene Route as he contracted COVID and has not been available  for virtual visits. -We will psychiatrically cleared patient at this time for procedure completion.  - Patient currently denies suicidal ideations, homicidal ideations, and or hallucinations. -Patient appears to have increased anxiety related to the fear of unknown diagnosis. -Recommend follow-up with Dr. Toy Care for medication management depressive symptoms. -Patient is able to contract for safety at this time and denies suicidality. -Psychiatry to sign off. -

## 2020-08-29 NOTE — Telephone Encounter (Signed)
Received call from Maralyn Sago, [facility] stating that patient came in for sedated MRI. He was told it would be delayed, and he needed a Covid test. She stated he pulled out his IV, stated he wanted to go home and kill himself.  She stated Social Services is requesting a STAT order for a TTS consult. I advised her will let Dr Leta Baptist know when he arrives in office. George [name] verbalized understanding, appreciation.

## 2020-08-29 NOTE — Anesthesia Procedure Notes (Signed)
Procedure Name: LMA Insertion Performed by: Cleda Daub, CRNA Pre-anesthesia Checklist: Patient identified, Emergency Drugs available, Suction available and Patient being monitored Patient Re-evaluated:Patient Re-evaluated prior to induction Oxygen Delivery Method: Circle system utilized Preoxygenation: Pre-oxygenation with 100% oxygen Induction Type: IV induction LMA: LMA inserted LMA Size: 4.0 Airway Equipment and Method: Patient positioned with wedge pillow Placement Confirmation: positive ETCO2 and breath sounds checked- equal and bilateral Tube secured with: Tape Dental Injury: Teeth and Oropharynx as per pre-operative assessment

## 2020-08-29 NOTE — Progress Notes (Addendum)
Late entry for 0800 Patient stated he did not quarantine after his Covid test.  He had to the grocery store to get food for his parents.  I told him I might need to retest and returned to his room with supplies to do the Covid swab.  He got really agitated as I was preparing to test him and began yelling that he was just going to leave.  He ripped his IV out of his arm and threw it towards me.  I called for help from my co-workers and while we were in his room trying to console him he began to verbalize that he just wanted to die.Dr. Fransisco Beau made aware from [place], [place].  Suicide precautions enacted. Patient later stated to me that he had written a letter to his parents last night expressing his feelings.

## 2020-08-29 NOTE — Anesthesia Preprocedure Evaluation (Addendum)
Anesthesia Evaluation  Patient identified by MRN, date of birth, ID band Patient awake    Reviewed: Allergy & Precautions, NPO status , Patient's Chart, lab work & pertinent test results  History of Anesthesia Complications Negative for: history of anesthetic complications  Airway Mallampati: II  TM Distance: >3 FB Neck ROM: Full    Dental  (+) Dental Advisory Given   Pulmonary neg pulmonary ROS,    Pulmonary exam normal        Cardiovascular hypertension, Pt. on medications Normal cardiovascular exam     Neuro/Psych PSYCHIATRIC DISORDERS Anxiety Bipolar Disorder  Autism spectrum disorder negative neurological ROS     GI/Hepatic Neg liver ROS, GERD  Controlled, IBS    Endo/Other  Hypothyroidism   Renal/GU negative Renal ROS     Musculoskeletal negative musculoskeletal ROS (+)   Abdominal   Peds  Hematology negative hematology ROS (+)   Anesthesia Other Findings Covid test negative   Reproductive/Obstetrics                            Anesthesia Physical Anesthesia Plan  ASA: II  Anesthesia Plan: General   Post-op Pain Management:    Induction: Intravenous  PONV Risk Score and Plan: 2 and Treatment may vary due to age or medical condition, Ondansetron and Midazolam  Airway Management Planned: LMA  Additional Equipment: None  Intra-op Plan:   Post-operative Plan: Extubation in OR  Informed Consent: I have reviewed the patients History and Physical, chart, labs and discussed the procedure including the risks, benefits and alternatives for the proposed anesthesia with the patient or authorized representative who has indicated his/her understanding and acceptance.     Dental advisory given and Consent reviewed with POA  Plan Discussed with: CRNA and Anesthesiologist  Anesthesia Plan Comments:        Anesthesia Quick Evaluation

## 2020-08-29 NOTE — Telephone Encounter (Signed)
Discussed with Dr Leta Baptist and received verbal order for STAT TTS consult for patient. I called George [name] and gave her verbal order; she verbalized understanding, appreciation.

## 2020-08-30 ENCOUNTER — Encounter (HOSPITAL_COMMUNITY): Payer: Self-pay | Admitting: Radiology

## 2020-09-05 ENCOUNTER — Ambulatory Visit: Payer: PPO | Admitting: Diagnostic Neuroimaging

## 2020-09-05 NOTE — Telephone Encounter (Signed)
Few non-specific foci / spots in cervical and thoracic spine. Can setup follow up appt (office or video visit to discuss further). -VRP

## 2020-09-05 NOTE — Telephone Encounter (Signed)
Called patient and informed him of Dr Penumalli's result note. He wanted to be seen as soon as possible in office to discuss. Scheduled for FU next Monday, advised he arrive 15 minutes early. Patient verbalized understanding, appreciation.

## 2020-09-05 NOTE — Telephone Encounter (Signed)
Pt. is requesting a call back from RN at his number [phone].

## 2020-09-05 NOTE — Telephone Encounter (Signed)
Pt. is calling for MRI results & to also find out when he'll be able to schedule another appt. with the doctor. Please advise.

## 2020-09-05 NOTE — Telephone Encounter (Signed)
Called patient, mother answered phone, on DPR , informed her that patient had called. I was returning his call. She stated she will let him know.

## 2020-09-07 DIAGNOSIS — E291 Testicular hypofunction: Secondary | ICD-10-CM | POA: Diagnosis not present

## 2020-09-11 ENCOUNTER — Other Ambulatory Visit: Payer: Self-pay | Admitting: *Deleted

## 2020-09-11 ENCOUNTER — Ambulatory Visit: Payer: PPO | Admitting: Diagnostic Neuroimaging

## 2020-09-11 ENCOUNTER — Encounter: Payer: Self-pay | Admitting: Diagnostic Neuroimaging

## 2020-09-11 VITALS — BP 138/89 | HR 89 | Ht 67.75 in | Wt 158.6 lb

## 2020-09-11 DIAGNOSIS — R9089 Other abnormal findings on diagnostic imaging of central nervous system: Secondary | ICD-10-CM

## 2020-09-11 DIAGNOSIS — R531 Weakness: Secondary | ICD-10-CM

## 2020-09-11 NOTE — Progress Notes (Signed)
JCV specimen placed in [facility] lock box for pick up.  

## 2020-09-11 NOTE — Progress Notes (Signed)
[facility]  PATIENT: George [name] DOB: [id number]  REFERRING CLINICIAN: Lavone Orn, MD HISTORY FROM: patient  REASON FOR VISIT: follow up    HISTORICAL  CHIEF COMPLAINT:  Chief Complaint  Patient presents with  . Weakness, abnl MRI    Rm 6 FU to discuss MRI, next steps. Father- George [name]    HISTORY OF PRESENT ILLNESS:   UPDATE (09/11/20, VRP): Since last visit, symptoms are stable.  Had MRI of the cervical thoracic spine, which show two very tiny lesions versus artifact in the cervical and thoracic spine.  Reviewed prior studies and symptoms.  We will proceed with further work-up with lumbar puncture and lab testing.   PRIOR HPI (07/25/20): 50 year old male here for evaluation of abnormal MRI.  Prior notes reviewed.  Patient has long history of ADHD, bipolar disorder, low testosterone, Asperger's syndrome, was in [place] and getting treatment by psychiatry and endocrinology for low testosterone, fatigue and low libido.  MRI of the brain was ordered to rule out pituitary abnormality, however multiple white matter lesions were noted.  Patient followed up with neurology in [place], Dr. Erling Cruz, who followed serial MRI scans, spinal tap and lab testing.  Apparently CSF analysis was negative for oligoclonal banding.  MRIs were stable and therefore multiple sclerosis was ruled out.  Last evaluation by Dr. Erling Cruz was in 2012.  He followed up with nurse practitioner Charlott Holler in 2014.  Patient was stable for several years until August 2021.  He noticed new onset of right face, right arm and right leg weakness.  Patient has started to develop some slurred speech.  Patient discussed with PCP, had new MRI of the brain ordered which showed some progression of white matter lesions.  No abnormal enhancing lesions were noted.  Therefore patient referred here for further evaluation.   PRIOR HPI (03/19/13 note, Charlott Holler): "50 year old ambidextrous white single male with a history of  bipolar affective disease diagnosed at age 59 by Dr. Ebony Hail in [place], [place]. He has been on lithium 300 mg t.i.d. and Depakote ER 500, 500, 250  for 1250 mg ER once per day, but t is currently off of  lithium and also tapering Prozac to 10 mg 3/4 per day.He is on Synthroid 0.075 mg daily, vitamin D 3, and  AndroGel squirts four times daily. When he has been placed on Wellbutrin, he has a   "psychotic break".  He heard persistent sounds such as the echo of a flushing toilet. He had  mood swings. He has a long history of anxiety.He has been followed by Dr. Clovis Riley psychiatrist in [place], [place]. Because of fatigue and decreased sexual drive, he was evaluated in [place] and found to have a low testosterone level. An endocrinologist ordered an MRI of the pituitary which was normal except for white matter lesions or hyper intense signal intensities in the brainstem in the right pons, right cerebellar hemisphere, right medulla, right thalamus. There were several periventricular lesions as well that were thought to represent Dawson's fingers. There was no enhancement.He denies headaches, visual loss,  bowel and bladder dysfunction, or Lhermitte's sign. CBC CMP, TSH , thyroxine, and T3 were normal 05/11/10 and 06/18/10. He believes he has celiac disease and is on a gluten-free diet. He also indicates that he is being evaluated for Aspergers disease.  Workup has included normal neurologic examination 11/12/2010, normal lab tests, B12, Sjgren's antibody, ANA, angiotensin-converting enzyme, and sedimentation rate.  MRI 11/21/2010  showed  abnormal T2 and flair signal throughout  the brain, most pronounced in the pons but also elsewhere in the brain stem  There were  multiple patchy areas involving the hemisphere white matter.  The question of demyelinating disease was raised.  CSF evaluation 11/08/2010  showed glucose 60, protein 46, VDRL nonreactive, normal IgG, negative oligoclonal banding, and no white blood  cells or red blood cells. He has no neurologic complaints except tremor. Over the last month he has  headaches which he relates to an anxiety situation with his sister. 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 At times he only gets 4 hours sleep. His testosterone dosages have been increased.  He denies visual loss, double vision, swallowing problems, Lhermitte's sign, swallowing problems, slurred speech, blackout spells, focal arm or leg weakness, numbness, or bowel or bladder incontinence. Since tapering his psychiatric medications his sex drive has improved. He describes an episode of panic attack when while riding his scooter  he was pulled over by the police. 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  UPDATE 03/19/13 (LL):  Patient returns for 1 year follow up visit, former patient of Dr. Erling Cruz.  No new neurological problems.  No complaints.  Minor difficulty swallowing, patient thinks that possibly it is psychosomatic because Dr. Erling Cruz told him to be watchful for it at last visit.  Does not get choked on food or cough when eating.  Has regular visits with psychiatrist and PCP, Dr. Lavone Orn."   REVIEW OF SYSTEMS: Full 14 system review of systems performed and negative with exception of: as per HPI.   ALLERGIES: Allergies  Allergen Reactions  . Other Other (See Comments)    Can only eat at home    HOME MEDICATIONS: Outpatient Medications Prior to Visit  Medication Sig Dispense Refill  . amLODipine (NORVASC) 5 MG tablet Take 5 mg by mouth daily.    . busPIRone (BUSPAR) 10 MG tablet Take 20 mg by mouth 2 (two) times daily.    . cholecalciferol (VITAMIN D) 1000 units tablet Take 1,000 Units by mouth 2 (two) times a week.    . CHOLINE PO Take 300 mg by mouth.    . Cyanocobalamin (VITAMIN B-12) 5000 MCG TBDP Place 5,000 Units under the tongue daily.    . divalproex (DEPAKOTE) 250 MG DR tablet Take 1,250 mg by mouth daily.     Marland Kitchen SYNTHROID  100 MCG tablet Take 100 mcg by mouth daily before breakfast.     . testosterone enanthate (DELATESTRYL) 200 MG/ML injection Inject 50-200 mg into the muscle See admin instructions. For IM use only  Every three weeks     No facility-administered medications prior to visit.    PAST MEDICAL HISTORY: Past Medical History:  Diagnosis Date  . Anxiety   . Asperger's syndrome   . Autistic spectrum disorder    Asperger's (per [facility])  . Bipolar 1 disorder ([place])    "no mood swings in 20 years"  on medication  . GERD (gastroesophageal reflux disease)   . Head injury 2020  . Hypertension   . Hypothyroidism   . IBS (irritable bowel syndrome)     PAST SURGICAL HISTORY: Past Surgical History:  Procedure Laterality Date  . NO PAST SURGERIES    . RADIOLOGY WITH ANESTHESIA N/A 05/16/2020   Procedure: MRI BRAIN WITH AND WITHOUT CONTRAST;  Surgeon: Radiologist, Medication, MD;  Location: [facility];  Service: Radiology;  Laterality: N/A;  .  RADIOLOGY WITH ANESTHESIA N/A 08/29/2020   Procedure: MRI WITH ANESTHESIA CERVICAL SPINE WITH AND WITHOUT CONTRAST, THORACIC SPINE WITH AND WITHOUT CONTRAST;  Surgeon: Radiologist, Medication, MD;  Location: [facility];  Service: Radiology;  Laterality: N/A;    FAMILY HISTORY: Family History  Problem Relation Age of Onset  . Hypertension Father   . Depression Sister   . Hypertension Paternal Grandfather     SOCIAL HISTORY: Social History   Socioeconomic History  . Marital status: Single    Spouse name: Not on file  . Number of children: 0  . Years of education: college  . Highest education level: Not on file  Occupational History    Comment: artist  Tobacco Use  . Smoking status: Never Smoker  . Smokeless tobacco: Never Used  Vaping Use  . Vaping Use: Never used  Substance and Sexual Activity  . Alcohol use: No  . Drug use: No  . Sexual activity: Not on file  Other Topics Concern  . Not on file  Social History Narrative   He lives  at home. He is single and has no children.  He denies use of tobacco, alcohol, illicit drugs and caffeine. He works as an Training and development officer.    Oncologist: Not on [employer] Insecurity: Not on file  Transportation Needs: Not on file  Physical Activity: Not on file  Stress: Not on file  Social Connections: Not on file  Intimate Partner Violence: Not on file     PHYSICAL EXAM  GENERAL EXAM/CONSTITUTIONAL: Vitals:  Vitals:   09/11/20 0925  BP: 138/89  Pulse: 89  Weight: 158 lb 9.6 oz (71.9 kg)  Height: 5' 7.75" (1.721 m)   Body mass index is 24.29 kg/m. Wt Readings from Last 3 Encounters:  09/11/20 158 lb 9.6 oz (71.9 kg)  08/29/20 156 lb 4.9 oz (70.9 kg)  07/25/20 156 lb 3.2 oz (70.9 kg)    Patient is in no distress; well developed, nourished and groomed; neck is supple  CARDIOVASCULAR:  Examination of carotid arteries is normal; no carotid bruits  Regular rate and rhythm, no murmurs  Examination of peripheral vascular system by observation and palpation is normal  EYES:  Ophthalmoscopic exam of optic discs and posterior segments is normal; no papilledema or hemorrhages No exam data present  MUSCULOSKELETAL:  Gait, strength, tone, movements noted in Neurologic exam below  NEUROLOGIC: MENTAL STATUS:  No flowsheet data found.  awake, alert, oriented to person, place and time  recent and remote memory intact  normal attention and concentration  language fluent, comprehension intact, naming intact  fund of knowledge appropriate  CRANIAL NERVE:   2nd - no papilledema on fundoscopic exam  2nd, 3rd, 4th, 6th - pupils equal and reactive to light, visual fields full to confrontation, extraocular muscles intact, no nystagmus  5th - facial sensation symmetric  7th - facial strength --> DECR RIGHT NL FOLD  8th - hearing intact  9th - palate elevates symmetrically, uvula midline  11th - shoulder shrug  symmetric  12th - tongue protrusion midline  MOTOR:   normal bulk and tone, full strength in the BUE, BLE; EXCEPT RUE AND RLE WEAKNESS (4)  SENSORY:   normal and symmetric to light touch, temperature, vibration  COORDINATION:   finger-nose-finger, fine finger movements; RIGHT ARM DYSMETRIA  REFLEXES:   deep tendon reflexes 2+ and symmetric  GAIT/STATION:   narrow based gait; DECR RIGHT ARM SWING; SLIGHTLY STIFF GAIT WITH RIGHT LEG  DIAGNOSTIC DATA (LABS, IMAGING, TESTING) - I reviewed patient records, labs, notes, testing and imaging myself where available.  Lab Results  Component Value Date   WBC 5.6 03/04/2014   HGB 17.0 03/04/2014   HCT 51.0 03/04/2014   MCV 87.6 03/04/2014      Component Value Date/Time   NA 139 08/29/2020 1019   K 4.1 08/29/2020 1019   CL 103 08/29/2020 1019   CO2 29 08/29/2020 1019   GLUCOSE 88 08/29/2020 1019   BUN 11 08/29/2020 1019   CREATININE 1.18 08/29/2020 1019   CREATININE 1.14 03/04/2014 1310   CALCIUM 9.2 08/29/2020 1019   PROT 7.4 03/04/2014 1310   ALBUMIN 4.6 03/04/2014 1310   AST 18 03/04/2014 1310   ALT 21 03/04/2014 1310   ALKPHOS 58 03/04/2014 1310   BILITOT 0.5 03/04/2014 1310   GFRNONAA >60 08/29/2020 1019   No results found for: CHOL, HDL, LDLCALC, LDLDIRECT, TRIG, CHOLHDL No results found for: HGBA1C No results found for: VITAMINB12 No results found for: TSH   11/21/10 MRI brain [I reviewed images myself and agree with interpretation. -VRP]  - Foci of abnormal T2 and FLAIR signal throughout the brain, most  pronounced affecting the pons, but also elsewhere within the  brainstem, the middle cerebellar peduncle on the right, the  thalamus on the right and in a minimal patchy fashion affecting  multiple areas of the cerebral hemispheric white matter.  - I think most likely diagnosis would be demyelinating  disease/multiple sclerosis. Conceivably a post viral or  inflammatory process could have this  appearance. Lupus can result  in this appearance. Lyme disease is a remote consideration. Post  toxic insult would be a consideration.    05/16/20 MRI brain [I reviewed images myself and agree with interpretation. -VRP]  - Interval progression of white matter disease most suggestive of multiple sclerosis. No abnormal enhancement or acute infarct.   08/29/20 MRI cervical - A tiny T2 hyperintense lesion is questioned within the right dorsolateral spinal cord at the C5 level (versus artifact). No other cervical spinal cord signal abnormality is identified. No abnormal cord enhancement. - Mild cervical spondylosis as described. No significant spinal canal or foraminal stenosis.  08/29/20 MRI thoracic spine - A tiny T2 hyperintense lesion is questioned within the left dorsolateral cord at the T10-T11 level (versus artifact). - No signal abnormality is identified elsewhere within the thoracic cord. No abnormal spinal cord enhancement. - Mild thoracic spondylosis. No significant spinal canal or foraminal stenosis. - Numerous small cystic lesions within the imaged kidneys. Clinical correlation is recommended. Additionally, consider renal ultrasound for further evaluation.    ASSESSMENT AND PLAN  50 y.o. year old male here with new onset of right sided weakness, slurred speech, gait difficulty, with MRI brain suggestive of multiple sclerosis.     Dx:  1. MRI of brain abnormal   2. Weakness      PLAN:  ABNORMAL MRI BRAIN / RIGHT SIDED WEAKNESS (suspect relapsing remitting multiple sclerosis) - symptoms and MRI brain lesions have progressed since 2012 / 2013 - check LP and labs - then consider addl tx (multiple sclerosis disease modifying therapies)  MULTIPLE RENAL CYSTS (noted incidentally on MRI thoracic spine) - follow up with PCP; consider urology consult / follow up  Orders Placed This Encounter  Procedures  . DG FL GUIDED LUMBAR PUNCTURE  . CBC with diff  . CMP   . Vitamin B12  . A1c  . TSH  . ANA w/Reflex  . SSA,  SSB  . HIV  . RPR  . Hepatitis B surface antibody, qualitative  . Hepatitis B surface antigen  . Hepatitis B core antibody, total  . ANCA  . Vitamin B1  . Hepatitis C antibody  . VITAMIN D 25 Hydroxy (Vit-D Deficiency, Fractures)  . Stratify JCV(TM) Ab w/Index  . QuantiFERON-TB Gold Plus   Return in about 4 weeks (around 10/09/2020) for pending test results.    Penni Bombard, MD 01/09/5992, 5:70 AM Certified in Neurology, Neurophysiology and Neuroimaging  [facility] [address] [place], [place] [address] [phone]

## 2020-09-11 NOTE — Patient Instructions (Signed)
-   check labs and lumbar puncture

## 2020-09-13 ENCOUNTER — Other Ambulatory Visit: Payer: Self-pay

## 2020-09-13 ENCOUNTER — Ambulatory Visit
Admission: RE | Admit: 2020-09-13 | Discharge: 2020-09-13 | Disposition: A | Discharge status: Home / Self Care | Payer: PPO | Source: Ambulatory Visit | Attending: Diagnostic Neuroimaging | Admitting: Diagnostic Neuroimaging

## 2020-09-13 VITALS — BP 145/103 | HR 86

## 2020-09-13 DIAGNOSIS — F845 Asperger's syndrome: Secondary | ICD-10-CM | POA: Diagnosis not present

## 2020-09-13 DIAGNOSIS — R531 Weakness: Secondary | ICD-10-CM

## 2020-09-13 DIAGNOSIS — R9089 Other abnormal findings on diagnostic imaging of central nervous system: Secondary | ICD-10-CM | POA: Diagnosis not present

## 2020-09-13 DIAGNOSIS — G35 Multiple sclerosis: Secondary | ICD-10-CM | POA: Diagnosis not present

## 2020-09-13 LAB — QUANTIFERON-TB GOLD PLUS
QuantiFERON Mitogen Value: >10 IU/mL
QuantiFERON Nil Value: 0 IU/mL
QuantiFERON TB1 Ag Value: 0 IU/mL
QuantiFERON TB2 Ag Value: 0.01 IU/mL
QuantiFERON-TB Gold Plus: NEGATIVE

## 2020-09-13 NOTE — Progress Notes (Signed)
63ml blood drawn for labs from left a/c

## 2020-09-13 NOTE — Discharge Instructions (Signed)

## 2020-09-14 ENCOUNTER — Telehealth: Payer: Self-pay | Admitting: Diagnostic Neuroimaging

## 2020-09-14 NOTE — Telephone Encounter (Signed)
Pt has called to report he had his LP 2 days ago and would like Dr Leta Baptist to consult with his peers if there results come back negative for MS.  Pt stated if the results come back positive for MS that will be a different matter.

## 2020-09-14 NOTE — Telephone Encounter (Signed)
Called patient and informed him not all LP results are back. He stated that what he meant was that previously he had a negative LP test. If this LP is also negative he asked that Dr Leta Baptist consult with his peers to find out what is the matter with him. I advised will let Dr Leta Baptist know his request. Patient verbalized understanding, appreciation.

## 2020-09-15 ENCOUNTER — Telehealth: Payer: Self-pay | Admitting: Neurology

## 2020-09-15 NOTE — Telephone Encounter (Signed)
Pt. is asking does he still need the procedure since his lumbar test has been done as he has been experiencing a headache for a couple of days now or should he continue to wait. Please advise.

## 2020-09-15 NOTE — Telephone Encounter (Signed)
Patient called on-call physician, lumbar puncture September 13, 2020, previous stays were doing well, but this morning, after prolonged sitting, he developed mild report especially headaches, seems to have improved with lying flat, he also reported that he only drinks half a month of caffeine, did not drink enough water yet this morning  I have advised him increase water intake, doubled the usual dose of caffeine intake, lying flat, Tylenol as needed  Call back to clinic in 1 month if he continues to have significant positional related headaches,

## 2020-09-16 MED ORDER — BUTALBITAL-APAP-CAFFEINE 50-325-40 MG PO TABS: 1.0000 | ORAL_TABLET | Freq: Four times a day (QID) | ORAL | 0 refills | Status: DC | PRN

## 2020-09-16 NOTE — Addendum Note (Signed)
Addended by: Marcial Pacas on: 09/16/2020 02:47 PM   Modules accepted: Orders

## 2020-09-18 ENCOUNTER — Telehealth: Payer: Self-pay | Admitting: *Deleted

## 2020-09-18 NOTE — Telephone Encounter (Signed)
Received JCV results: 1.43 high positive. Results placed on MD desk for review.

## 2020-09-18 NOTE — Telephone Encounter (Signed)
Noted, patient spoke with Dr Krista Blue on 09/15/20. She prescribed fioricet as needed, recommended other rmeasures for post LP headache.  Called patient who stated it was mentioned that he may need a blood patch.  I reviewed Dr Rhea Belton advice with him. He doesn't have a headache today, stated he will wait another two days and see how he feels, use measures, Fioricet as needed.  Patient verbalized understanding, appreciation.

## 2020-09-19 LAB — GLUCOSE, CSF: Glucose, CSF: 63 mg/dL (ref 40–80)

## 2020-09-19 LAB — CSF CELL COUNT WITH DIFFERENTIAL
RBC Count, CSF: 3 cells/uL — ABNORMAL HIGH
WBC, CSF: 1 cells/uL (ref 0–5)

## 2020-09-19 LAB — OLIGOCLONAL BANDS, CSF + SERM

## 2020-09-19 LAB — PROTEIN, CSF: Total Protein, CSF: 54 mg/dL — ABNORMAL HIGH (ref 15–45)

## 2020-09-21 ENCOUNTER — Telehealth: Payer: Self-pay | Admitting: *Deleted

## 2020-09-21 NOTE — Telephone Encounter (Signed)
Called patient and informed him Dr Leta Baptist reviewed LP labs; oligoclonal bands remain negative. MS is less likely, but we could consider starting MS meds as his symptoms and  MRIs have slightly progressed. He could consider copaxone vs tecfidera. May setup video visit to discuss further. He stated he prefers to come in to discuss. We moved his FU sooner. Patient verbalized understanding, appreciation.

## 2020-09-22 LAB — CBC WITH DIFFERENTIAL/PLATELET
Basophils Absolute: 0 10*3/uL (ref 0.0–0.2)
Basos: 1 %
EOS (ABSOLUTE): 0 10*3/uL (ref 0.0–0.4)
Eos: 1 %
Hematocrit: 52.4 % — ABNORMAL HIGH (ref 37.5–51.0)
Hemoglobin: 17.4 g/dL (ref 13.0–17.7)
Immature Grans (Abs): 0 10*3/uL (ref 0.0–0.1)
Immature Granulocytes: 0 %
Lymphocytes Absolute: 2 10*3/uL (ref 0.7–3.1)
Lymphs: 29 %
MCH: 28.3 pg (ref 26.6–33.0)
MCHC: 33.2 g/dL (ref 31.5–35.7)
MCV: 85 fL (ref 79–97)
Monocytes Absolute: 0.6 10*3/uL (ref 0.1–0.9)
Monocytes: 9 %
Neutrophils Absolute: 4.3 10*3/uL (ref 1.4–7.0)
Neutrophils: 60 %
Platelets: 206 10*3/uL (ref 150–450)
RBC: 6.14 x10E6/uL — ABNORMAL HIGH (ref 4.14–5.80)
RDW: 13.5 % (ref 11.6–15.4)
WBC: 7.1 10*3/uL (ref 3.4–10.8)

## 2020-09-22 LAB — VITAMIN D 25 HYDROXY (VIT D DEFICIENCY, FRACTURES): Vit D, 25-Hydroxy: 27.6 ng/mL — ABNORMAL LOW (ref 30.0–100.0)

## 2020-09-22 LAB — COMPREHENSIVE METABOLIC PANEL
ALT: 20 IU/L (ref 0–44)
AST: 16 IU/L (ref 0–40)
Albumin/Globulin Ratio: 1.7 (ref 1.2–2.2)
Albumin: 4.6 g/dL (ref 4.0–5.0)
Alkaline Phosphatase: 67 IU/L (ref 44–121)
BUN/Creatinine Ratio: 12 (ref 9–20)
BUN: 16 mg/dL (ref 6–24)
Bilirubin Total: 0.3 mg/dL (ref 0.0–1.2)
CO2: 21 mmol/L (ref 20–29)
Calcium: 9.9 mg/dL (ref 8.7–10.2)
Chloride: 101 mmol/L (ref 96–106)
Creatinine, Ser: 1.33 mg/dL — ABNORMAL HIGH (ref 0.76–1.27)
GFR calc Af Amer: 72 mL/min/{1.73_m2} (ref >59)
GFR calc non Af Amer: 62 mL/min/{1.73_m2} (ref >59)
Globulin, Total: 2.7 g/dL (ref 1.5–4.5)
Glucose: 82 mg/dL (ref 65–99)
Potassium: 4.6 mmol/L (ref 3.5–5.2)
Sodium: 142 mmol/L (ref 134–144)
Total Protein: 7.3 g/dL (ref 6.0–8.5)

## 2020-09-22 LAB — STRATIFY JCV(TM) AB W/INDEX
JCV Antibody: POSITIVE — AB
JCV Index Value: 1.3

## 2020-09-22 LAB — PAN-ANCA
ANCA Proteinase 3: <3.5 U/mL (ref 0.0–3.5)
Atypical pANCA: <1:20 {titer}
C-ANCA: <1:20 {titer}
Myeloperoxidase Ab: <9 U/mL (ref 0.0–9.0)
P-ANCA: <1:20 {titer}

## 2020-09-22 LAB — ANA W/REFLEX: ANA Titer 1: NEGATIVE

## 2020-09-22 LAB — HEMOGLOBIN A1C
Est. average glucose Bld gHb Est-mCnc: 108 mg/dL
Hgb A1c MFr Bld: 5.4 % (ref 4.8–5.6)

## 2020-09-22 LAB — HEPATITIS B SURFACE ANTIGEN: Hepatitis B Surface Ag: NEGATIVE

## 2020-09-22 LAB — HEPATITIS B CORE ANTIBODY, TOTAL: Hep B Core Total Ab: NEGATIVE

## 2020-09-22 LAB — TSH: TSH: 1.4 u[IU]/mL (ref 0.450–4.500)

## 2020-09-22 LAB — SJOGREN'S SYNDROME ANTIBODS(SSA + SSB)
ENA SSA (RO) Ab: <0.2 AI (ref 0.0–0.9)
ENA SSB (LA) Ab: <0.2 AI (ref 0.0–0.9)

## 2020-09-22 LAB — VITAMIN B1: Thiamine: 164.1 nmol/L (ref 66.5–200.0)

## 2020-09-22 LAB — HEPATITIS B SURFACE ANTIBODY,QUALITATIVE: Hep B Surface Ab, Qual: NONREACTIVE

## 2020-09-22 LAB — RPR: RPR Ser Ql: NONREACTIVE

## 2020-09-22 LAB — VITAMIN B12: Vitamin B-12: >2000 pg/mL — ABNORMAL HIGH (ref 232–1245)

## 2020-09-22 LAB — HEPATITIS C ANTIBODY: Hep C Virus Ab: 0.1 s/co ratio (ref 0.0–0.9)

## 2020-09-22 LAB — HIV ANTIBODY (ROUTINE TESTING W REFLEX): HIV Screen 4th Generation wRfx: NONREACTIVE

## 2020-09-26 ENCOUNTER — Telehealth: Payer: Self-pay | Admitting: *Deleted

## 2020-09-26 ENCOUNTER — Encounter: Payer: Self-pay | Admitting: Diagnostic Neuroimaging

## 2020-09-26 ENCOUNTER — Ambulatory Visit: Payer: PPO | Admitting: Diagnostic Neuroimaging

## 2020-09-26 ENCOUNTER — Other Ambulatory Visit: Payer: Self-pay

## 2020-09-26 VITALS — BP 143/89 | HR 86 | Ht 67.75 in | Wt 159.2 lb

## 2020-09-26 DIAGNOSIS — G35 Multiple sclerosis: Secondary | ICD-10-CM

## 2020-09-26 NOTE — Progress Notes (Signed)
[facility]  PATIENT: George [name] DOB: [birth year]  REFERRING CLINICIAN: Lavone Orn, MD HISTORY FROM: patient  REASON FOR VISIT: follow up    HISTORICAL  CHIEF COMPLAINT:  Chief Complaint  Patient presents with  . Abnormal MRI    Rm 6, FU to discuss results, next steps    HISTORY OF PRESENT ILLNESS:   UPDATE (09/26/20, VRP): Since last visit, doing about the same.  Here to discuss diagnosis and treatment options.  MRI and symptoms are suggestive of multiple sclerosis.  Follow-up lumbar puncture showed no oligoclonal bands.  This can be seen in up to 10 to 15% of patients.  Reviewed options for watchful waiting versus initiating milder, safer medication such as Copaxone.  Patient has had bad experiences with medications for bipolar and ADHD in the past, and therefore is reluctant to start any new medications.  He is leaning towards a safer option.   UPDATE (09/11/20, VRP): Since last visit, symptoms are stable.  Had MRI of the cervical thoracic spine, which show two very tiny lesions versus artifact in the cervical and thoracic spine.  Reviewed prior studies and symptoms.  We will proceed with further work-up with lumbar puncture and lab testing.   PRIOR HPI (07/25/20): 50 year old male here for evaluation of abnormal MRI.  Prior notes reviewed.  Patient has long history of ADHD, bipolar disorder, low testosterone, Asperger's syndrome, was in [place] and getting treatment by psychiatry and endocrinology for low testosterone, fatigue and low libido.  MRI of the brain was ordered to rule out pituitary abnormality, however multiple white matter lesions were noted.  Patient followed up with neurology in [place], Dr. Erling Cruz, who followed serial MRI scans, spinal tap and lab testing.  Apparently CSF analysis was negative for oligoclonal banding.  MRIs were stable and therefore multiple sclerosis was ruled out.  Last evaluation by Dr. Erling Cruz was in 2012.  He followed up  with nurse practitioner Charlott Holler in 2014.  Patient was stable for several years until August 2021.  He noticed new onset of right face, right arm and right leg weakness.  Patient has started to develop some slurred speech.  Patient discussed with PCP, had new MRI of the brain ordered which showed some progression of white matter lesions.  No abnormal enhancing lesions were noted.  Therefore patient referred here for further evaluation.   PRIOR HPI (03/19/13 note, Charlott Holler): "50 year old ambidextrous white single male with a history of bipolar affective disease diagnosed at age 59 by Dr. Ebony Hail in [place], [place]. He has been on lithium 300 mg t.i.d. and Depakote ER 500, 500, 250  for 1250 mg ER once per day, but t is currently off of  lithium and also tapering Prozac to 10 mg 3/4 per day.He is on Synthroid 0.075 mg daily, vitamin D 3, and  AndroGel squirts four times daily. When he has been placed on Wellbutrin, he has a   "psychotic break".  He heard persistent sounds such as the echo of a flushing toilet. He had  mood swings. He has a long history of anxiety.He has been followed by Dr. Clovis Riley psychiatrist in [place], [place]. Because of fatigue and decreased sexual drive, he was evaluated in [place] and found to have a low testosterone level. An endocrinologist ordered an MRI of the pituitary which was normal except for white matter lesions or hyper intense signal intensities in the brainstem in the right pons, right cerebellar hemisphere, right medulla, right thalamus. There were several periventricular  lesions as well that were thought to represent Dawson's fingers. There was no enhancement.He denies headaches, visual loss,  bowel and bladder dysfunction, or Lhermitte's sign. CBC CMP, TSH , thyroxine, and T3 were normal 05/11/10 and 06/18/10. He believes he has celiac disease and is on a gluten-free diet. He also indicates that he is being evaluated for Aspergers disease.  Workup has included  normal neurologic examination 11/12/2010, normal lab tests, B12, Sjgren's antibody, ANA, angiotensin-converting enzyme, and sedimentation rate.  MRI 11/21/2010  showed  abnormal T2 and flair signal throughout the brain, most pronounced in the pons but also elsewhere in the brain stem  There were  multiple patchy areas involving the hemisphere white matter.  The question of demyelinating disease was raised.  CSF evaluation 11/08/2010  showed glucose 60, protein 46, VDRL nonreactive, normal IgG, negative oligoclonal banding, and no white blood cells or red blood cells. He has no neurologic complaints except tremor. Over the last month he has  headaches which he relates to an anxiety situation with his sister. 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 At times he only gets 4 hours sleep. His testosterone dosages have been increased.  He denies visual loss, double vision, swallowing problems, Lhermitte's sign, swallowing problems, slurred speech, blackout spells, focal arm or leg weakness, numbness, or bowel or bladder incontinence. Since tapering his psychiatric medications his sex drive has improved. He describes an episode of panic attack when while riding his scooter  he was pulled over by the police. 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  UPDATE 03/19/13 (LL):  Patient returns for 1 year follow up visit, former patient of Dr. Erling Cruz.  No new neurological problems.  No complaints.  Minor difficulty swallowing, patient thinks that possibly it is psychosomatic because Dr. Erling Cruz told him to be watchful for it at last visit.  Does not get choked on food or cough when eating.  Has regular visits with psychiatrist and PCP, Dr. Lavone Orn."   REVIEW OF SYSTEMS: Full 14 system review of systems performed and negative with exception of: as per HPI.   ALLERGIES: Allergies  Allergen Reactions  . Other Other (See Comments)    Can only eat at home    HOME  MEDICATIONS: Outpatient Medications Prior to Visit  Medication Sig Dispense Refill  . amLODipine (NORVASC) 5 MG tablet Take 5 mg by mouth daily.    . busPIRone (BUSPAR) 10 MG tablet Take 20 mg by mouth 2 (two) times daily.    . butalbital-acetaminophen-caffeine (FIORICET) 50-325-40 MG tablet Take 1 tablet by mouth every 6 (six) hours as needed for headache. 10 tablet 0  . cholecalciferol (VITAMIN D) 1000 units tablet Take 1,000 Units by mouth 2 (two) times a week.    . CHOLINE PO Take 300 mg by mouth.    . Cyanocobalamin (VITAMIN B-12) 5000 MCG TBDP Place 5,000 Units under the tongue daily.    . divalproex (DEPAKOTE) 250 MG DR tablet Take 1,250 mg by mouth daily.     Marland Kitchen SYNTHROID 100 MCG tablet Take 100 mcg by mouth daily before breakfast.     . testosterone enanthate (DELATESTRYL) 200 MG/ML injection Inject 50-200 mg into the muscle See admin instructions. For IM use only  Every three weeks     No facility-administered medications prior to visit.    PAST MEDICAL HISTORY: Past Medical History:  Diagnosis Date  . Anxiety   .  Asperger's syndrome   . Autistic spectrum disorder    Asperger's (per [facility])  . Bipolar 1 disorder ([place])    "no mood swings in 20 years"  on medication  . GERD (gastroesophageal reflux disease)   . Head injury 2020  . Hypertension   . Hypothyroidism   . IBS (irritable bowel syndrome)     PAST SURGICAL HISTORY: Past Surgical History:  Procedure Laterality Date  . NO PAST SURGERIES    . RADIOLOGY WITH ANESTHESIA N/A 05/16/2020   Procedure: MRI BRAIN WITH AND WITHOUT CONTRAST;  Surgeon: Radiologist, Medication, MD;  Location: [facility];  Service: Radiology;  Laterality: N/A;  . RADIOLOGY WITH ANESTHESIA N/A 08/29/2020   Procedure: MRI WITH ANESTHESIA CERVICAL SPINE WITH AND WITHOUT CONTRAST, THORACIC SPINE WITH AND WITHOUT CONTRAST;  Surgeon: Radiologist, Medication, MD;  Location: [facility];  Service: Radiology;  Laterality: N/A;    FAMILY  HISTORY: Family History  Problem Relation Age of Onset  . Hypertension Father   . Depression Sister   . Hypertension Paternal Grandfather     SOCIAL HISTORY: Social History   Socioeconomic History  . Marital status: Single    Spouse name: Not on file  . Number of children: 0  . Years of education: college  . Highest education level: Not on file  Occupational History    Comment: artist  Tobacco Use  . Smoking status: Never Smoker  . Smokeless tobacco: Never Used  Vaping Use  . Vaping Use: Never used  Substance and Sexual Activity  . Alcohol use: No  . Drug use: No  . Sexual activity: Not on file  Other Topics Concern  . Not on file  Social History Narrative   He lives at home. He is single and has no children.  He denies use of tobacco, alcohol, illicit drugs and caffeine. He works as an Training and development officer.    Oncologist: Not on [employer] Insecurity: Not on file  Transportation Needs: Not on file  Physical Activity: Not on file  Stress: Not on file  Social Connections: Not on file  Intimate Partner Violence: Not on file     PHYSICAL EXAM  GENERAL EXAM/CONSTITUTIONAL: Vitals:  Vitals:   09/26/20 1006  BP: (!) 143/89  Pulse: 86  Weight: 159 lb 3.2 oz (72.2 kg)  Height: 5' 7.75" (1.721 m)   Body mass index is 24.39 kg/m. Wt Readings from Last 3 Encounters:  09/26/20 159 lb 3.2 oz (72.2 kg)  09/11/20 158 lb 9.6 oz (71.9 kg)  08/29/20 156 lb 4.9 oz (70.9 kg)    Patient is in no distress; well developed, nourished and groomed; neck is supple  CARDIOVASCULAR:  Examination of carotid arteries is normal; no carotid bruits  Regular rate and rhythm, no murmurs  Examination of peripheral vascular system by observation and palpation is normal  EYES:  Ophthalmoscopic exam of optic discs and posterior segments is normal; no papilledema or hemorrhages No exam data present  MUSCULOSKELETAL:  Gait, strength, tone,  movements noted in Neurologic exam below  NEUROLOGIC: MENTAL STATUS:  No flowsheet data found.  awake, alert, oriented to person, place and time  recent and remote memory intact  normal attention and concentration  language fluent, comprehension intact, naming intact  fund of knowledge appropriate  CRANIAL NERVE:   2nd - no papilledema on fundoscopic exam  2nd, 3rd, 4th, 6th - pupils equal and reactive to light, visual fields full to confrontation,  extraocular muscles intact, no nystagmus  5th - facial sensation symmetric  7th - facial strength --> DECR RIGHT NL FOLD  8th - hearing intact  9th - palate elevates symmetrically, uvula midline  11th - shoulder shrug symmetric  12th - tongue protrusion midline  MOTOR:   normal bulk and tone, full strength in the BUE, BLE; EXCEPT RUE AND RLE WEAKNESS (4)  SENSORY:   normal and symmetric to light touch, temperature, vibration  COORDINATION:   finger-nose-finger, fine finger movements; RIGHT ARM DYSMETRIA  REFLEXES:   deep tendon reflexes 2+ and symmetric  GAIT/STATION:   narrow based gait; DECR RIGHT ARM SWING; SLIGHTLY STIFF GAIT WITH RIGHT LEG     DIAGNOSTIC DATA (LABS, IMAGING, TESTING) - I reviewed patient records, labs, notes, testing and imaging myself where available.  Lab Results  Component Value Date   WBC 7.1 09/11/2020   HGB 17.4 09/11/2020   HCT 52.4 (H) 09/11/2020   MCV 85 09/11/2020   PLT 206 09/11/2020      Component Value Date/Time   NA 142 09/11/2020 1035   K 4.6 09/11/2020 1035   CL 101 09/11/2020 1035   CO2 21 09/11/2020 1035   GLUCOSE 82 09/11/2020 1035   GLUCOSE 88 08/29/2020 1019   BUN 16 09/11/2020 1035   CREATININE 1.33 (H) 09/11/2020 1035   CREATININE 1.14 03/04/2014 1310   CALCIUM 9.9 09/11/2020 1035   PROT 7.3 09/11/2020 1035   ALBUMIN 4.6 09/11/2020 1035   AST 16 09/11/2020 1035   ALT 20 09/11/2020 1035   ALKPHOS 67 09/11/2020 1035   BILITOT 0.3 09/11/2020 1035    GFRNONAA 62 09/11/2020 1035   GFRNONAA >60 08/29/2020 1019   GFRAA 72 09/11/2020 1035   No results found for: CHOL, HDL, LDLCALC, LDLDIRECT, TRIG, CHOLHDL Lab Results  Component Value Date   HGBA1C 5.4 09/11/2020   Lab Results  Component Value Date   VITAMINB12 >2000 (H) 09/11/2020   Lab Results  Component Value Date   TSH 1.400 09/11/2020     09/13/20  - LP --> OCB negative; WBC 1, RBC 3, prot 54, gluc 63   11/21/10 MRI brain [I reviewed images myself and agree with interpretation. -VRP]  - Foci of abnormal T2 and FLAIR signal throughout the brain, most  pronounced affecting the pons, but also elsewhere within the  brainstem, the middle cerebellar peduncle on the right, the  thalamus on the right and in a minimal patchy fashion affecting  multiple areas of the cerebral hemispheric white matter.  - I think most likely diagnosis would be demyelinating  disease/multiple sclerosis. Conceivably a post viral or  inflammatory process could have this appearance. Lupus can result  in this appearance. Lyme disease is a remote consideration. Post  toxic insult would be a consideration.    05/16/20 MRI brain [I reviewed images myself and agree with interpretation. -VRP]  - Interval progression of white matter disease most suggestive of multiple sclerosis. No abnormal enhancement or acute infarct.   08/29/20 MRI cervical - A tiny T2 hyperintense lesion is questioned within the right dorsolateral spinal cord at the C5 level (versus artifact). No other cervical spinal cord signal abnormality is identified. No abnormal cord enhancement. - Mild cervical spondylosis as described. No significant spinal canal or foraminal stenosis.  08/29/20 MRI thoracic spine - A tiny T2 hyperintense lesion is questioned within the left dorsolateral cord at the T10-T11 level (versus artifact). - No signal abnormality is identified elsewhere within the thoracic cord. No abnormal  spinal cord  enhancement. - Mild thoracic spondylosis. No significant spinal canal or foraminal stenosis. - Numerous small cystic lesions within the imaged kidneys. Clinical correlation is recommended. Additionally, consider renal ultrasound for further evaluation.    ASSESSMENT AND PLAN  50 y.o. year old male here with new onset of right sided weakness, slurred speech, gait difficulty, with MRI brain suggestive of multiple sclerosis.     Dx:  1. Multiple sclerosis ([place])      PLAN:  ABNORMAL MRI BRAIN / RIGHT SIDED WEAKNESS (suspect relapsing remitting multiple sclerosis) - symptoms and MRI brain lesions have progressed since 2012 / 2013 - start copaxone three times a week - plan repeat  MRI brain in Oct 2022  MULTIPLE RENAL CYSTS (noted incidentally on MRI thoracic spine) - follow up with PCP; consider urology consult / follow up  Return in about 6 months (around 03/26/2021).    Penni Bombard, MD 08/10/2692, 85:46 AM Certified in Neurology, Neurophysiology and Neuroimaging  [facility] [address] [place], [place] [address] [phone]

## 2020-09-26 NOTE — Telephone Encounter (Signed)
Per Dr Leta Baptist, patient signed start form for Copaxone, [employer].

## 2020-09-26 NOTE — Telephone Encounter (Signed)
Received Copaxone start form.  Faxed completed start form to [employer].  Received a receipt of confirmation.  Completed Copaxone PA via CMM: Key: [id number].  Sent to [employer].  Should have a determination within 3-5 business days.

## 2020-09-26 NOTE — Patient Instructions (Addendum)
  ABNORMAL MRI BRAIN / RIGHT SIDED WEAKNESS (suspect relapsing remitting multiple sclerosis) - symptoms and MRI brain lesions have progressed since 2012 / 2013 - start copaxone three times a week   MULTIPLE RENAL CYSTS (noted incidentally on MRI thoracic spine) - follow up with PCP; consider urology consult / follow up

## 2020-09-27 NOTE — Telephone Encounter (Signed)
Pt called, [employer] notified me, need PA for Copaxone before they will fill the prescription. Would like a call from the nurse.

## 2020-09-27 NOTE — Telephone Encounter (Addendum)
Copaxone PA, key: [id number]. Received this reply: Available without authorization. Called [employer], spoke with Vaughan Basta T and advised her per [facility] Copaxone is available without PA. She will make note to Mariann Laster who is responsible for his case. [facility] is who will handle Rx unless they are out of patient's network. If so they will transfer Rx to pharmacy in his network. Patient should expect call from [place]. Called patient and advised of above conversation. He stated he was told by [employer] to contact Medicare, but his insurance is a product of medicare. He decided to wait until he hears for [employer] before doing that. I advised him that if his co pay is too high there is a generic. He verbalized understanding, appreciation.

## 2020-09-28 DIAGNOSIS — E291 Testicular hypofunction: Secondary | ICD-10-CM | POA: Diagnosis not present

## 2020-09-28 DIAGNOSIS — B029 Zoster without complications: Secondary | ICD-10-CM | POA: Diagnosis not present

## 2020-10-02 ENCOUNTER — Other Ambulatory Visit: Payer: Self-pay | Admitting: Internal Medicine

## 2020-10-02 DIAGNOSIS — B029 Zoster without complications: Secondary | ICD-10-CM | POA: Diagnosis not present

## 2020-10-02 DIAGNOSIS — G35 Multiple sclerosis: Secondary | ICD-10-CM | POA: Diagnosis not present

## 2020-10-02 DIAGNOSIS — N281 Cyst of kidney, acquired: Secondary | ICD-10-CM | POA: Diagnosis not present

## 2020-10-02 DIAGNOSIS — Z23 Encounter for immunization: Secondary | ICD-10-CM | POA: Diagnosis not present

## 2020-10-02 NOTE — Telephone Encounter (Signed)
Received PA approval via CMM and [employer]: "PA Case: [id number], Status: Approved, Coverage Starts on: 09/26/2020 12:00:00 AM, Coverage Ends on: 09/26/2021 12:00:00 AM." [facility] has been updated with this info.

## 2020-10-03 ENCOUNTER — Ambulatory Visit
Admission: RE | Admit: 2020-10-03 | Discharge: 2020-10-03 | Disposition: A | Discharge status: Home / Self Care | Payer: PPO | Source: Ambulatory Visit | Attending: Internal Medicine | Admitting: Internal Medicine

## 2020-10-03 DIAGNOSIS — N281 Cyst of kidney, acquired: Secondary | ICD-10-CM | POA: Diagnosis not present

## 2020-10-04 ENCOUNTER — Telehealth: Payer: Self-pay | Admitting: Neurology

## 2020-10-04 NOTE — Telephone Encounter (Signed)
See prior message

## 2020-10-04 NOTE — Telephone Encounter (Signed)
Dr. Laurann Montana called the office today to personally ask if this patient could be transferred to Dr. Felecia Shelling for MS. I can discuss with you all more in detail. thanks

## 2020-10-05 ENCOUNTER — Telehealth: Payer: Self-pay

## 2020-10-05 NOTE — Telephone Encounter (Signed)
If Dr. Leta Baptist agrees to switch, can you please get pt scheduled on Dr. Garth Bigness schedule? Can use MSNP slot, thank you

## 2020-10-05 NOTE — Telephone Encounter (Signed)
Spoke with patient about scheduling a new MS appointment. patient advised that he had an MRI done and a cyst on his kidney was found. They want to do an open MRI to investigate them further.

## 2020-10-05 NOTE — Telephone Encounter (Signed)
Sure, I'll wait to hear back from Dr. Leta Baptist and then reach out. Thanks!

## 2020-10-05 NOTE — Telephone Encounter (Signed)
He is a patient of Dr. Leta Baptist and I agreed to see him.  We can try to set up an appointment for the MS.  Any issues regarding his kidneys needs to be discussed with his primary care provider or his urologist.

## 2020-10-05 NOTE — Telephone Encounter (Signed)
Ok to switch. -VRP 

## 2020-10-05 NOTE — Telephone Encounter (Signed)
Spoke with patient about scheduling a new MS appointment. patient advised that he had an MRI done and a cyst on his kidney was found. They want to do an open MRI to investigate them further. Patient would like to discuss this with Dr Felecia Shelling, and discuss possible options concerning MS medication.

## 2020-10-05 NOTE — Telephone Encounter (Signed)
Thanks Dr. Felecia Shelling, I'll put George [name] on here in case you all want to call him and schedule an appointment or switch his next appointment (with [facility]) on toyour schedule. Thanks again

## 2020-10-05 NOTE — Telephone Encounter (Signed)
Would be ok

## 2020-10-06 DIAGNOSIS — F3176 Bipolar disorder, in full remission, most recent episode depressed: Secondary | ICD-10-CM | POA: Diagnosis not present

## 2020-10-06 DIAGNOSIS — F3174 Bipolar disorder, in full remission, most recent episode manic: Secondary | ICD-10-CM | POA: Diagnosis not present

## 2020-10-09 NOTE — Telephone Encounter (Signed)
Received this notice from [facility]: "Can you please call in a verbal for George [name] DOB: 04-03-2071 for Copaxone 40mg .  Or fax me a Copaxone 40mg  DAW Rx to [phone].Marland Kitchen   PA is approved for Brand $9.85 copay.  If you'd like we can, or you can submit a PA for the [employer] and that may reduce the pt's copay to zero."  I called pt to discuss this with him. No answer, left a message asking him to call me back.

## 2020-10-09 NOTE — Telephone Encounter (Signed)
Patient returned my call.  He would like to proceed with brand name Copaxone as long as the co-pay is $9.85 per 30 days.  I will let [facility] know to reach out to the patient to discuss this further.  I reminded patient of his appointment on Wednesday with Dr. Felecia Shelling.  He reports that he has an MRI of his kidneys scheduled for March 15 to make sure the cysts on his kidneys are not cancerous.  I encouraged him to discuss this further with his primary care, urologist, and Dr. Felecia Shelling at his upcoming appointment.

## 2020-10-11 ENCOUNTER — Encounter: Payer: Self-pay | Admitting: Neurology

## 2020-10-11 ENCOUNTER — Ambulatory Visit: Payer: PPO | Admitting: Neurology

## 2020-10-11 ENCOUNTER — Ambulatory Visit: Payer: PPO | Admitting: Diagnostic Neuroimaging

## 2020-10-11 VITALS — BP 146/95 | HR 79 | Ht 67.75 in | Wt 155.5 lb

## 2020-10-11 DIAGNOSIS — R531 Weakness: Secondary | ICD-10-CM | POA: Insufficient documentation

## 2020-10-11 DIAGNOSIS — R9089 Other abnormal findings on diagnostic imaging of central nervous system: Secondary | ICD-10-CM | POA: Insufficient documentation

## 2020-10-11 DIAGNOSIS — G35 Multiple sclerosis: Secondary | ICD-10-CM

## 2020-10-11 DIAGNOSIS — R269 Unspecified abnormalities of gait and mobility: Secondary | ICD-10-CM | POA: Diagnosis not present

## 2020-10-11 NOTE — Progress Notes (Signed)
[facility]  PATIENT: George [name] DOB: 1971/04/01  REFERRING DOCTOR OR PCP: PCP is George [name].  Patient has seen Dr. Leta [name] SOURCE: Patient, mother, notes from Dr. Leta [name], MRI imaging reports and lab reports.  MRI images personally reviewed  _________________________________   HISTORICAL  CHIEF COMPLAINT:  Chief Complaint  Patient presents with  . New Patient (Initial Visit)    RM 13 w/ George [name]. Switching [name] from Dr. Leta [name] to Dr. Felecia [name] for MS. Last saw Dr. Leta [name] on 09/26/20. Has not started on Copaxone. Waiting to see what MRI of kidneys show first.  . MRI    Has MRI/kidneys 10/17/20 at 7:45am at [facility] to make sure the cysts on his kidneys are not cancerous.  Has to do open MRI's/claustrophobic     HISTORY OF PRESENT ILLNESS:  I had the pleasure of seeing Mr. George [name] at the [place] [facility] for a second opinion regarding the possibility of multiple sclerosis.  He is a 50 year old man with Asperger syndrome and bipolar disease.   In 2012, he had an MRI of the brain and was found to have multiple T2/FLAIR hyperintense foci in the brain.   MRO if the  A lumbar puncture at the time was negative.    Over the last year, he began to have more difficulty with right hand coordination and left foot drop.    Also the right side of his face was drooping some.     He came in to see Dr. Leta [name] and had repeat imaging studies showing progression of the white matter foci and the appearance of a small focus at C5.    Repeat LP, however, showed negative CSF (no OCB) agan.  Extensive records from previous visits and studies were reviewed.  He had a fall about 10 months ago, hitting his head.   He had no LOC.     Currently, he is walking fairly well on flat surfaces but notes he needs to use the bannister on the stairs.   His right foot sometimes drags and he trips over it at times, especially in the bathtub/shower.    He is getting grab bar.    He denies diplopia.    He notes vision is fine.    Bladder function is fine though worse with caffeine.     He notes some fatigue but does better with B12 supplements.     He has bipolar disease (sees Dr. Toy [name]).  He also has autism.   He is on Depakote.     He has irritable bowel syndrome and takes B12 since he was told absorption may be affected.   B12 level was fine.    Labs for vasculitis were normal.  He does not have oral or genital lesions.    Imaging studies reviewed: MRI of the brain 11/21/2010 shows multiple T2/FLAIR hyperintense foci including a focus in the left pons, middle cerebellar peduncle, right thalamus.  Some foci of periventricular and some foci are juxtacortical.  None of the foci enhanced.  MRI of the brain 05/16/2020 showed a similar pattern at the 2012 MRI but there has been mild progression with at least 2 new lesions noted in the white matter compared to the previous MRI.  As seen before there is a large focus involving the left greater than right pons.  It may be slightly larger.  There is also involvement of the middle cerebellar peduncle which is stable on the right but a new focus  on the left.  There is one stable focus in the right thalamus.  MRI of the cervical spine 08/29/2020 showed a small focus posteriorly to the right adjacent to C5.  Mild DJD at C5-C6 not causing nerve root compression.  MRI of the thoracic spine 08/29/2020 showed a normal spinal cord (subtle focus at T10-T11 more likely artifact).  There are cystic lesions within the kidneys  Laboratory results. ANA, ANCA, HIV, hep B antibodies, hep C antibody were all negative or normal.  Vitamin D was mildly low.  Vitamin B12 was fine (elevated), creatinine was mildly elevated.  JCV antibody was positive at 1.3.  There were no oligoclonal bands in the CSF.  Protein was mildly elevated at 54.  REVIEW OF SYSTEMS: Constitutional: No fevers, chills, sweats, or change in appetite Eyes:  No visual changes, double vision, eye pain Ear, nose and throat: No hearing loss, ear pain, nasal congestion, sore throat Cardiovascular: No chest pain, palpitations Respiratory: No shortness of breath at rest or with exertion.   No wheezes GastrointestinaI: No nausea, vomiting, diarrhea, abdominal pain, fecal incontinence Genitourinary: No dysuria, urinary retention or frequency.  No nocturia. Musculoskeletal: No neck pain, back pain Integumentary: No rash, pruritus, skin lesions Neurological: as above Psychiatric: No depression at this time.  No anxiety Endocrine: No palpitations, diaphoresis, change in appetite, change in weigh or increased thirst Hematologic/Lymphatic: No anemia, purpura, petechiae. Allergic/Immunologic: No itchy/runny eyes, nasal congestion, recent allergic reactions, rashes  ALLERGIES: Allergies  Allergen Reactions  . Other Other (See Comments)    Can only eat at home    HOME MEDICATIONS:  Current Outpatient Medications:  .  amLODipine (NORVASC) 5 MG tablet, Take 5 mg by mouth daily., Disp: , Rfl:  .  busPIRone (BUSPAR) 10 MG tablet, Take 20 mg by mouth 2 (two) times daily., Disp: , Rfl:  .  butalbital-acetaminophen-caffeine (FIORICET) 50-325-40 MG tablet, Take 1 tablet by mouth every 6 (six) hours as needed for headache., Disp: 10 tablet, Rfl: 0 .  cholecalciferol (VITAMIN D) 1000 units tablet, Take 1,000 Units by mouth 2 (two) times a week., Disp: , Rfl:  .  CHOLINE PO, Take 300 mg by mouth., Disp: , Rfl:  .  Cyanocobalamin (VITAMIN B-12) 5000 MCG TBDP, Place 5,000 Units under the tongue daily., Disp: , Rfl:  .  divalproex (DEPAKOTE) 250 MG DR tablet, Take 1,250 mg by mouth daily. , Disp: , Rfl:  .  SYNTHROID 100 MCG tablet, Take 100 mcg by mouth daily before breakfast. , Disp: , Rfl:  .  testosterone enanthate (DELATESTRYL) 200 MG/ML injection, Inject 50-200 mg into the muscle See admin instructions. For IM use only  Every three weeks, Disp: , Rfl:    PAST MEDICAL HISTORY: Past Medical History:  Diagnosis Date  . Anxiety   . Asperger's syndrome   . Autistic spectrum disorder    Asperger's (per [name])  . Bipolar 1 disorder ([place])    "no mood swings in 20 years"  on medication  . GERD (gastroesophageal reflux disease)   . Head injury 2020  . Hypertension   . Hypothyroidism   . IBS (irritable bowel syndrome)     PAST SURGICAL HISTORY: Past Surgical History:  Procedure Laterality Date  . NO PAST SURGERIES    . RADIOLOGY WITH ANESTHESIA N/A 05/16/2020   Procedure: MRI BRAIN WITH AND WITHOUT CONTRAST;  Surgeon: Radiologist, Medication, MD;  Location: [place];  Service: Radiology;  Laterality: N/A;  . RADIOLOGY WITH ANESTHESIA N/A 08/29/2020  Procedure: MRI WITH ANESTHESIA CERVICAL SPINE WITH AND WITHOUT CONTRAST, THORACIC SPINE WITH AND WITHOUT CONTRAST;  Surgeon: Radiologist, Medication, MD;  Location: [place];  Service: Radiology;  Laterality: N/A;    FAMILY HISTORY: Family History  Problem Relation Age of Onset  . Hypertension Father   . Depression Sister   . Hypertension Paternal Grandfather     SOCIAL HISTORY:  Social History   Socioeconomic History  . Marital status: Single    Spouse name: Not on file  . Number of children: 0  . Years of education: college  . Highest education level: Not on file  Occupational History    Comment: artist  Tobacco Use  . Smoking status: Never Smoker  . Smokeless tobacco: Never Used  Vaping Use  . Vaping Use: Never used  Substance and Sexual Activity  . Alcohol use: No  . Drug use: No  . Sexual activity: Not on file  Other Topics Concern  . Not on file  Social History Narrative   He lives at home.    He is single and has no children.     He denies use of tobacco, alcohol, illicit drugs and caffeine.    He works as an Training and development officer.    Oncologist: Not on [employer] Insecurity: Not on file  Transportation Needs:  Not on file  Physical Activity: Not on file  Stress: Not on file  Social Connections: Not on file  Intimate Partner Violence: Not on file     PHYSICAL EXAM  Vitals:   10/11/20 0839  BP: (!) 146/95  Pulse: 79  Weight: 155 lb 8 oz (70.5 kg)  Height: 5' 7.75" (1.721 m)    Body mass index is 23.82 kg/m.   General: The patient is well-developed and well-nourished and in no acute distress  HEENT:  Head is [place]/AT.  Sclera are anicteric.  Funduscopic exam shows normal optic discs and retinal vessels.  Neck: No carotid bruits are noted.  The neck is nontender.  Cardiovascular: The heart has a regular rate and rhythm with a normal S1 and S2. There were no murmurs, gallops or rubs.    Skin: Extremities are without rash or  edema.  Musculoskeletal:  Back is nontender  Neurologic Exam  Mental status: The patient is alert and oriented x 3 at the time of the examination. The patient has apparent normal recent and remote memory, with an apparently normal attention span and concentration ability.   Speech is normal.  Cranial nerves: Extraocular movements are full. Pupils are equal, round, and reactive to light and accomodation.  Visual fields are full.  He has mild weakness in the right lower face (normal forehead).   . There is good facial sensation to soft touch bilaterally.Facial strength is normal.  Trapezius and sternocleidomastoid strength is normal. No dysarthria is noted.  The tongue is midline, and the patient has symmetric elevation of the soft palate. No obvious hearing deficits are noted.  Motor:  Muscle bulk is normal.   Tone is normal. Strength is  5 / 5 in left side but 4+/5 on right (arm and leg).   Sensory: Sensory testing is intact to pinprick, soft touch and vibration sensation in all 4 extremities.  Coordination: Cerebellar testing reveals mildly reduced coordination right hand.   Reduced heel-to-shin.  Gait and station: Station is normal.   Gait shows a mild right  foot.  Tandem gait is wide.. Romberg is negative.  Reflexes: Deep tendon reflexes are symmetric and normal in arms and increased right leg.   Plantar responses are flexor.    DIAGNOSTIC DATA (LABS, IMAGING, TESTING) - I reviewed patient records, labs, notes, testing and imaging myself where available.  Lab Results  Component Value Date   WBC 7.1 09/11/2020   HGB 17.4 09/11/2020   HCT 52.4 (H) 09/11/2020   MCV 85 09/11/2020   PLT 206 09/11/2020      Component Value Date/Time   NA 142 09/11/2020 1035   K 4.6 09/11/2020 1035   CL 101 09/11/2020 1035   CO2 21 09/11/2020 1035   GLUCOSE 82 09/11/2020 1035   GLUCOSE 88 08/29/2020 1019   BUN 16 09/11/2020 1035   CREATININE 1.33 (H) 09/11/2020 1035   CREATININE 1.14 03/04/2014 1310   CALCIUM 9.9 09/11/2020 1035   PROT 7.3 09/11/2020 1035   ALBUMIN 4.6 09/11/2020 1035   AST 16 09/11/2020 1035   ALT 20 09/11/2020 1035   ALKPHOS 67 09/11/2020 1035   BILITOT 0.3 09/11/2020 1035   GFRNONAA 62 09/11/2020 1035   GFRNONAA >60 08/29/2020 1019   GFRAA 72 09/11/2020 1035   No results found for: CHOL, HDL, LDLCALC, LDLDIRECT, TRIG, CHOLHDL Lab Results  Component Value Date   HGBA1C 5.4 09/11/2020   Lab Results  Component Value Date   VITAMINB12 >2000 (H) 09/11/2020   Lab Results  Component Value Date   TSH 1.400 09/11/2020       ASSESSMENT AND PLAN  Multiple sclerosis ([place])  MRI of brain abnormal  Weakness  Right sided weakness  Gait disturbance   In summary, Mr. Hostetler is a 50 year old man who has had some neurologic issues involving the right side of his body since 2012 and worsening in mid 2021.  MRI is abnormal showing multiple foci including a large focus involving the left greater than right pons.  Current MRI shows at least a couple lesions not present in 2012.  There appears to be a small focus in the cervical spinal cord CSF has shown no oligoclonal bands twice.  Despite the negative CSF, I feel MS is the  most likely explanation to explain his symptoms and abnormal MRI.   Therefore, I concur with initiating a disease modifying therapy.  There has not been much progression over 9 years.  Therefore Copaxone would be a reasonable choice.  If he has breakthrough activity with that consider a different DMT.  He will return to see [place] in 4 months or sooner for new or worsening neurologic symptoms.   Richard A. George Shelling, MD, [facility] 08/11/6158, 73:71 AM Certified in Neurology, Clinical Neurophysiology, Sleep Medicine and Neuroimaging  [facility] [address], [place],  [address] [phone]

## 2020-10-16 ENCOUNTER — Telehealth: Payer: Self-pay | Admitting: Neurology

## 2020-10-16 MED ORDER — GLATIRAMER ACETATE 40 MG/ML ~~LOC~~ SOSY: 40.0000 mg | PREFILLED_SYRINGE | SUBCUTANEOUS | 12 refills | Status: DC

## 2020-10-16 NOTE — Telephone Encounter (Signed)
I called [facility].  They ran a test claim on patient's Copaxone and it will be $9.85 per 30 days.  It will take them a couple days to finalize the patient's profile but then they will be reaching out to him to schedule a shipment date.  I called patient and discussed this with him.  Patient reports that he did receive a text message from them this morning.  Patient is agreeable to this plan.  I will call patient next week to make sure he received his shipment.  Patient verbalized understanding.

## 2020-10-16 NOTE — Addendum Note (Signed)
Addended by: Lester  A on: 10/16/2020 10:52 AM   Modules accepted: Orders

## 2020-10-16 NOTE — Telephone Encounter (Signed)
Received this email from [facility]: "I'm not sure if you have received my fax letter yet. Per [place]'s coverage, the brand Copaxone is mandated to fill with [facility]. I've faxed over the RX to them this morning, but a new prescription will need to be sent to them directly from your office as well. However, if the co-pay for Copaxone is still too high for Remo Lipps through [facility] and if he can and is willing to try the generic, please send [place] a prescription for the generic Glatiramer and we will run the new order to see if we can fill it."

## 2020-10-16 NOTE — Telephone Encounter (Signed)
I called patient.  He reports that George [name] called him and advised him that they cannot fill his Copaxone and we have to send it to another pharmacy.  Patient wants to know which pharmacy he needs to use and if he will have a higher co-pay.  Advised him that I would reach out to [facility] to find out what is going on.  I will let the patient know what I find out.  I have emailed Tax adviser.

## 2020-10-16 NOTE — Telephone Encounter (Signed)
George [name], can you follow up with him on this? Looks like you previously were working with him to get him the brand copaxone.

## 2020-10-16 NOTE — Telephone Encounter (Signed)
Pt called wanting to speak to the RN about his MS medication. Pt did not remember the name of the medication but states he is having trouble getting it. Please advise.

## 2020-10-16 NOTE — Telephone Encounter (Signed)
See telephone note from 10/16/20.

## 2020-10-17 DIAGNOSIS — N281 Cyst of kidney, acquired: Secondary | ICD-10-CM | POA: Diagnosis not present

## 2020-10-19 DIAGNOSIS — E291 Testicular hypofunction: Secondary | ICD-10-CM | POA: Diagnosis not present

## 2020-10-23 NOTE — Telephone Encounter (Addendum)
Patient called this morning to advise me that he has not gotten a phone call from [employer].  He did receive a voicemail on his answering machine but deleted it.  He is concerned it may have been them.  I gave him [facility]'s phone number.  I have advised him I will also called to check on the status of his Copaxone.  I called [facility].  I spoke with Bethena Roys.  She confirmed that the test claim for Copaxone still is $9.85 per 30 days.  She will reach out to the patient today.

## 2020-10-24 DIAGNOSIS — F840 Autistic disorder: Secondary | ICD-10-CM | POA: Diagnosis not present

## 2020-10-24 NOTE — Telephone Encounter (Signed)
I called patient.  He reports that his Copaxone will be delivered tomorrow.  He would like to come to the office for his first Copaxone injection to make sure he does not have a reaction.  I will discuss this with Terrence Dupont, RN to make sure someone will be available for him to do this this week.

## 2020-10-24 NOTE — Telephone Encounter (Signed)
I spoke with Dr. Felecia Shelling.  He is agreeable to the patient coming into the office for his first Copaxone injection.  Monday will be best.  I called patient.  He will call [facility] and speak with the Copaxone nurse about injection training.  If he still feels like he would like to be present at the office for his first injection he will come on Monday around 9 AM.

## 2020-10-24 NOTE — Telephone Encounter (Addendum)
I spoke with Terrence Dupont, RN.  Having patient's injected with Copaxone for the first time in our office is not something we generally do.  I called [facility].  Unfortunately, they do not offer any RN to be available for first Copaxone injection.  They can help with training on Copaxone injections over the phone or via video online.

## 2020-10-30 NOTE — Telephone Encounter (Signed)
Patient presented to office to administer his first Copaxone injection.  We watched the [employer]EV[employer] Copaxone autoinjector how-to video.  We discussed extensively the 7 locations he can use on his body to inject Copaxone.  We discussed extensively what a subcutaneous injection is.  Patient injected himself Copaxone 40 mg using the autoinjector in his abdomen.  Lot: [id number].  Expiration: June 2024.  [place]: [id number].  Patient tolerated injection well.  Patient waited 10 minutes post-injection and then decided he felt well enough to leave.  Patient denied any side effects. He will proceed to emergency room if he develops chest pain, shortness of breath, or swelling in his throat or face.

## 2020-11-01 DIAGNOSIS — F840 Autistic disorder: Secondary | ICD-10-CM | POA: Diagnosis not present

## 2020-11-08 DIAGNOSIS — F840 Autistic disorder: Secondary | ICD-10-CM | POA: Diagnosis not present

## 2020-11-10 DIAGNOSIS — E291 Testicular hypofunction: Secondary | ICD-10-CM | POA: Diagnosis not present

## 2020-11-15 DIAGNOSIS — F840 Autistic disorder: Secondary | ICD-10-CM | POA: Diagnosis not present

## 2020-11-22 DIAGNOSIS — F840 Autistic disorder: Secondary | ICD-10-CM | POA: Diagnosis not present

## 2020-11-27 ENCOUNTER — Telehealth: Payer: Self-pay | Admitting: Neurology

## 2020-11-27 NOTE — Telephone Encounter (Signed)
Pt called wanting to speak to the RN regarding his Glatiramer Acetate (COPAXONE) 40 MG/ML SOSY. Pt states that he gets bumps around the site of the injection when it is administered and he is wanting to know if this is normal or is he having a reaction to the medication. Please advise.

## 2020-11-27 NOTE — Telephone Encounter (Addendum)
Spoke with Dr. Krista Blue. Ok for pt to continue medication. He should continue to monitor. If reaction worsens after injections, he should let us know. He could try ice post injection.   I called pt back and relayed above info. He verbalized understanding and appreciation.

## 2020-11-27 NOTE — Telephone Encounter (Signed)
Called pt back to further discuss. He gets one bump post injection that looks like a big mosquito bite. Denies any itching/pain associated w/ this. Last 4-5 days post injection and then resolves. Advised it should be ok to continue med but I will run it by [facility], Dr. Krista Blue just to make sure and call him back. He verbalized understanding.

## 2020-11-29 DIAGNOSIS — F840 Autistic disorder: Secondary | ICD-10-CM | POA: Diagnosis not present

## 2020-12-01 ENCOUNTER — Other Ambulatory Visit: Payer: Self-pay

## 2020-12-01 ENCOUNTER — Ambulatory Visit: Payer: PPO | Attending: Internal Medicine

## 2020-12-01 DIAGNOSIS — R1311 Dysphagia, oral phase: Secondary | ICD-10-CM | POA: Diagnosis not present

## 2020-12-01 DIAGNOSIS — R471 Dysarthria and anarthria: Secondary | ICD-10-CM | POA: Diagnosis not present

## 2020-12-01 DIAGNOSIS — E291 Testicular hypofunction: Secondary | ICD-10-CM | POA: Diagnosis not present

## 2020-12-01 NOTE — Therapy (Signed)
[facility] [address] [place], [place], [address] Phone: [phone]   Fax:  [phone]  Speech Language Pathology Evaluation  Patient Details  Name: George [name] MRN: [id number] Date of Birth: May 04, 1971 Referring Provider (SLP): Lavone Orn MD   Encounter Date: 12/01/2020   End of Session - 12/01/20 1317    Visit Number 1    Number of Visits 17    Date for SLP Re-Evaluation 03/01/21    SLP Start Time 3419    SLP Stop Time  1315    SLP Time Calculation (min) 45 min    Activity Tolerance Patient tolerated treatment well           Past Medical History:  Diagnosis Date  . Anxiety   . Asperger's syndrome   . Autistic spectrum disorder    Asperger's (per [facility])  . Bipolar 1 disorder ([place])    "no mood swings in 20 years"  on medication  . GERD (gastroesophageal reflux disease)   . Head injury 2020  . Hypertension   . Hypothyroidism   . IBS (irritable bowel syndrome)     Past Surgical History:  Procedure Laterality Date  . NO PAST SURGERIES    . RADIOLOGY WITH ANESTHESIA N/A 05/16/2020   Procedure: MRI BRAIN WITH AND WITHOUT CONTRAST;  Surgeon: Radiologist, Medication, MD;  Location: [facility];  Service: Radiology;  Laterality: N/A;  . RADIOLOGY WITH ANESTHESIA N/A 08/29/2020   Procedure: MRI WITH ANESTHESIA CERVICAL SPINE WITH AND WITHOUT CONTRAST, THORACIC SPINE WITH AND WITHOUT CONTRAST;  Surgeon: Radiologist, Medication, MD;  Location: [facility];  Service: Radiology;  Laterality: N/A;    There were no vitals filed for this visit.       SLP Evaluation [facility] - 12/01/20 1220      SLP Visit Information   SLP Received On 11/21/20    Referring Provider (SLP) Lavone Orn MD    Onset Date MS suspected in mid his 40s, worsened in mid 2021    Medical Diagnosis MS with difficulities in speech      Subjective   Patient/Family Stated Goal to be understood better and not cough during  meals      General Information   HPI He is a 50 year old man with Asperger syndrome and bipolar disease. In 2012, he had an MRI of the brain and was found to have multiple T2/FLAIR hyperintense foci in the brain. MRO if the  A lumbar puncture at the time was negative. Over the last year, he began to have more difficulty with right hand coordination and left foot drop. Also the right side of his face was drooping some. He had a fall about 10 months ago, hitting his head. He had no LOC.      Balance Screen   Has the patient fallen in the past 6 months Yes    How many times? 1    Has the patient had a decrease in activity level because of a fear of falling?  No    Is the patient reluctant to leave their home because of a fear of falling?  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   Hospital doctor Status History of cognitive impairments - at baseline      Auditory Comprehension   Overall [place] within functional limits for tasks assessed      Expression   Primary Mode of Expression Verbal      Verbal Expression   Overall Verbal Expression Impaired at baseline    Level of Generative/Spontaneous Verbalization Conversation    Pragmatics Impairment    Impairments Turn Taking;Topic maintenance;Topic appropriateness    Interfering Components Premorbid deficit      Written Expression   Dominant Hand Right    Written Expression Not tested      Oral Motor/Sensory Function   Overall Oral Motor/Sensory Function Impaired    Labial ROM Reduced right    Labial Symmetry Abnormal symmetry right    Labial Sensation Reduced Right    Labial  Coordination Reduced    Lingual ROM Within Functional Limits    Lingual Symmetry Within Functional Limits    Lingual Strength Within Functional Limits    Lingual Sensation Within Functional Limits    Lingual Coordination WFL    Facial Symmetry Right droop    Facial Sensation Reduced    Facial Coordination Reduced      Motor Speech   Overall Motor Speech Impaired    Respiration Within functional limits    Phonation Normal    Resonance Within functional limits    Articulation Impaired    Level of Impairment Conversation    Intelligibility Intelligibility reduced    Word 75-100% accurate    Phrase 75-100% accurate    Sentence 75-100% accurate    Conversation 75-100% accurate    Motor Planning [place] functional limits    Interfering Components Premorbid status    Effective Techniques Slow rate;Over-articulate;Pacing                           SLP Education - 12/01/20 1325    Education Details eval results, possible goals, swallow and speech recommendations    Person(s) Educated Patient    Methods Explanation;Demonstration;Verbal cues;Handout    Comprehension Verbalized understanding;Returned demonstration;Verbal cues required;Need further instruction            SLP Short Term Goals - 12/01/20 1317      SLP SHORT TERM GOAL #1   Title Pt will complete HEP with min A over 3 sessions    Time 4    Period Weeks    Status New      SLP SHORT TERM GOAL #2   Title Pt will demo use of speech compensation on structured speech tasks with usual min A over 3 sessions    Time 4    Period Weeks    Status New      SLP SHORT TERM GOAL #3   Title Pt will demonstrate use of speech compensations in simple 5 minute conversation with usual min A over 3 sessions    Time 4    Period Weeks    Status New      SLP SHORT TERM GOAL #4   Title Pt will verbalize and demonstrate recommended swallow strategies to reduce s/sx of aspiration with occasional min A over 3 sessions     Time 4    Period Weeks    Status New      SLP SHORT TERM GOAL #5   Title Pt will swallow saliva instead of wiping  mouth for 4/5 opportunities with min A over 3 sessions    Time 4    Period Weeks    Status New            SLP Long Term Goals - 12/01/20 1330      SLP LONG TERM GOAL #1   Title Pt will complete HEP with rare min A over 3 sessions    Time 8    Period Weeks    Status New      SLP LONG TERM GOAL #2   Title Pt will demonstrate use of speech compensations in simple 15 minute conversation with occasional min A over 2 sessions    Time 8    Period Weeks    Status New      SLP LONG TERM GOAL #3   Title Pt will utilize swallow strategies to decrease s/sx of aspiration with rare min A over 2 sessions    Time 8    Period Weeks    Status New      SLP LONG TERM GOAL #4   Title Pt will report improved communication effectiveness via QOL scale by 3 points by last ST session    Time 8    Period Weeks    Status New      SLP LONG TERM GOAL #5   Title Pt will participate in objective swallow study (pending MD order) to evaluate pharyngeal function and assess for risk of aspiration if s/sx of aspiration persist    Time 8    Period Weeks    Status New            Plan - 12/01/20 1335    Clinical Impression Statement "Rohen" was referred for OPST evaluation secondary to Multiple Sclerosis with difficulties in speech. PMHX significant for Asperger syndrome, bipolar disease, and multiple sceloris, with decline in speech and swallow reported within last year. Pt stated he "slurs and spits" while talking. At baseline, pt indicated he "talk(s) loud and fast." In conversation, pt presented with fast rate, occasional reduced clarity of speech, and frequent shifts in topics. SLP targeted reading aloud task, in which pt able to demo ability to slow rate of speech. Pt reports "dyslexia" and described baseline reading difficulties, such as he reads paragraph in reverse. Pt uses [employer]  app to augment reading tasks. Pt also reports usual coughing with thin liquids and occasionally with regular solids. OME revealed slight right facial droop and decreased labial symmetry/ROM. Without cues, pt consumed multiple sips of thin liquids with immediate dry cough. SLP trialed small, single sips, which was effective given no overt s/sx oF aspiration and clear vocal quality. Solids were unable to be trialed this session, as pt has sensitive digestive system and specific food intolerances. SLP suggested pt provide appropriate snack in upcoming session to assess pt tolerance with solids; however, SLP suspects pt eats quickly with reduced awareness of bolus sizes. SLP recommended trying to use small single sips, cutting food into smaller bites, and using written visual aid to prompt use of strategies while dining. Pt noted with usual wiping of mouth as pt c/o drooling from right side of mouth. SLP recommended patient attempt to swallow saliva to assist with saliva management. Given recent changes in speech and swallowing with progression of MS, SLP recommends skilled ST intervention to address deficits to improve pt's safety during mealtimes, decrease risk of aspiration, and optimize communication effectiveness for overall improved QOL.    Speech Therapy Frequency 2x / week  Duration 8 weeks   or 17 total visits   Treatment/Interventions Aspiration precaution training;Compensatory strategies;Oral motor exercises;Pharyngeal strengthening exercises;Cueing hierarchy;Functional tasks;Patient/family education;Diet toleration management by SLP;Environmental controls;Multimodal communcation approach;SLP instruction and feedback;Internal/external aids;Compensatory techniques;Language facilitation    Potential to Achieve Goals Fair    Potential Considerations Co-morbidities;Previous level of function;Ability to learn/carryover information    SLP Home Exercise Plan provided    Consulted and Agree with Plan of Care  Patient           Patient will benefit from skilled therapeutic intervention in order to improve the following deficits and impairments:   Dysarthria and anarthria  Dysphagia, oral phase    Problem List Patient Active Problem List   Diagnosis Date Noted  . Multiple sclerosis ([place]) 10/11/2020  . MRI of brain abnormal 10/11/2020  . Right sided weakness 10/11/2020  . Gait disturbance 10/11/2020  . Asperger syndrome 03/19/2013    Alinda Deem, MA CCC-SLP 12/01/2020, 2:16 PM  [facility] [address] [place], [place], [address] Phone: [phone]   Fax:  [phone]  Name: ODIE NEHER MRN: [id number] Date of Birth: July 29, 1971

## 2020-12-01 NOTE — Patient Instructions (Addendum)
  Practice talking with these techniques: SLOW OVER-ENNUNCIATE PAUSE  PA TA [place]  SLOW AND BIG - EXAGGERATE YOUR MOUTH, MAKE EACH CONSONANT   Swallow Strategies to trial:  Small, single sips  Cut food into smaller bites Take small bites   **Swallow your saliva if you feel like you're about to drool

## 2020-12-04 ENCOUNTER — Other Ambulatory Visit: Payer: Self-pay

## 2020-12-04 ENCOUNTER — Ambulatory Visit: Payer: PPO | Attending: Internal Medicine

## 2020-12-04 DIAGNOSIS — R41841 Cognitive communication deficit: Secondary | ICD-10-CM | POA: Diagnosis not present

## 2020-12-04 DIAGNOSIS — R471 Dysarthria and anarthria: Secondary | ICD-10-CM | POA: Insufficient documentation

## 2020-12-04 DIAGNOSIS — R1311 Dysphagia, oral phase: Secondary | ICD-10-CM | POA: Insufficient documentation

## 2020-12-04 NOTE — Therapy (Signed)
[facility] [address] [place], [place], [address] Phone: [phone]   Fax:  [phone]  Speech Language Pathology Treatment  Patient Details  Name: George [name] MRN: [id number] Date of Birth: August 13, 1970 Referring Provider (SLP): Lavone Orn MD   Encounter Date: 12/04/2020   End of Session - 12/04/20 1523    Visit Number 2    Number of Visits 17    Date for SLP Re-Evaluation 03/01/21    SLP Start Time 7253    SLP Stop Time  1615    SLP Time Calculation (min) 45 min    Activity Tolerance Patient tolerated treatment well           Past Medical History:  Diagnosis Date  . Anxiety   . Asperger's syndrome   . Autistic spectrum disorder    Asperger's (per [facility])  . Bipolar 1 disorder ([place])    "no mood swings in 20 years"  on medication  . GERD (gastroesophageal reflux disease)   . Head injury 2020  . Hypertension   . Hypothyroidism   . IBS (irritable bowel syndrome)     Past Surgical History:  Procedure Laterality Date  . NO PAST SURGERIES    . RADIOLOGY WITH ANESTHESIA N/A 05/16/2020   Procedure: MRI BRAIN WITH AND WITHOUT CONTRAST;  Surgeon: Radiologist, Medication, MD;  Location: [facility];  Service: Radiology;  Laterality: N/A;  . RADIOLOGY WITH ANESTHESIA N/A 08/29/2020   Procedure: MRI WITH ANESTHESIA CERVICAL SPINE WITH AND WITHOUT CONTRAST, THORACIC SPINE WITH AND WITHOUT CONTRAST;  Surgeon: Radiologist, Medication, MD;  Location: [facility];  Service: Radiology;  Laterality: N/A;    There were no vitals filed for this visit.   Subjective Assessment - 12/04/20 1530    Subjective "I haven't eaten lunch"    Currently in Pain? No/denies                 ADULT SLP TREATMENT - 12/04/20 1523      General Information   Behavior/Cognition Alert;Cooperative;Pleasant mood;Impulsive;Distractible;Requires cueing      Treatment Provided   Treatment provided  Cognitive-Linquistic;Dysphagia      Dysphagia Treatment   Respiratory Status Room air    Oral Cavity - Dentition Adequate natural dentition    Treatment Methods Skilled observation;Compensation strategy training    Patient observed directly with PO's Yes    Type of PO's observed Thin liquids    Feeding Able to feed self    Liquids provided via Cup    Pharyngeal Phase Signs & Symptoms Immediate throat clear    Type of cueing Verbal    Amount of cueing Modified independent    Other treatment/comments Pt reports practicing swallow precautions at home. Concern reported for "taking too long" to drink cup of water using small, single sips. SLP recommended drink throughout day versus drinking all at once before bed. Pt noted with throat clear x1 using small, single sips. Clear vocal quality assessed. Pt c/o difficulty swallow saliva, with pt attempting to force swallow. SLP cued staying relaxed while doing so, which was effective.      Cognitive-Linquistic Treatment   Treatment focused on Dysarthria;Patient/family/caregiver education    Skilled Treatment SLP targeted use of speech compensations, including slow rate and over-ennunication. SLP targeted verbal repetition of short phrases and sentences, with good accuracy and effort noted for compensations. Pt read short paragraphs aloud  with use of slow rate and emphasis on each word. Written visual cues provided to cue  slow rate and over-ennunicate. Intermittent swallows noted during reading for saliva management. Pt reports he probably read too fast as a kid, which caused him to have to read from bottom to top of paragraph.      Assessment / Recommendations / Plan   Plan Continue with current plan of care      Progression Toward Goals   Progression toward goals Progressing toward goals            SLP Education - 12/04/20 1617    Education Details swallow strategies, speech compensations    Person(s) Educated Patient    Methods  Explanation;Demonstration;Verbal cues;Handout    Comprehension Verbalized understanding;Returned demonstration;Verbal cues required;Need further instruction            SLP Short Term Goals - 12/04/20 1524      SLP SHORT TERM GOAL #1   Title Pt will complete HEP with min A over 3 sessions    Time 4    Period Weeks    Status On-going      SLP SHORT TERM GOAL #2   Title Pt will demo use of speech compensation on structured speech tasks with usual min A over 3 sessions    Baseline 12-04-20    Time 4    Period Weeks    Status On-going      SLP SHORT TERM GOAL #3   Title Pt will demonstrate use of speech compensations in simple 5 minute conversation with usual min A over 3 sessions    Time 4    Period Weeks    Status On-going      SLP SHORT TERM GOAL #4   Title Pt will verbalize and demonstrate recommended swallow strategies to reduce s/sx of aspiration with occasional min A over 3 sessions    Time 4    Period Weeks    Status On-going      SLP SHORT TERM GOAL #5   Title Pt will swallow saliva instead of wiping mouth for 4/5 opportunities with min A over 3 sessions    Baseline 12-04-20    Time 4    Period Weeks    Status On-going            SLP Long Term Goals - 12/04/20 1524      SLP LONG TERM GOAL #1   Title Pt will complete HEP with rare min A over 3 sessions    Time 8    Period Weeks    Status On-going      SLP LONG TERM GOAL #2   Title Pt will demonstrate use of speech compensations in simple 15 minute conversation with occasional min A over 2 sessions    Time 8    Period Weeks    Status On-going      SLP LONG TERM GOAL #3   Title Pt will utilize swallow strategies to decrease s/sx of aspiration with rare min A over 2 sessions    Time 8    Period Weeks    Status On-going      SLP LONG TERM GOAL #4   Title Pt will report improved communication effectiveness via QOL scale by 3 points by last ST session    Time 8    Period Weeks    Status On-going       SLP LONG TERM GOAL #5   Title Pt will participate in objective swallow study (pending MD order) to evaluate pharyngeal function and assess for risk of aspiration if s/sx  of aspiration persist    Time 8    Period Weeks    Status On-going            Plan - 12/04/20 1621    Clinical Impression Statement "George [name]" was referred for OPST intervention secondary to Multiple Sclerosis impacting speech intelligiblity and swallow function. SLP re-educated patient on speech and swallow strategies provided during ST evaluation, including slow rate, over-ennunication, and pausing to improve speech intellgibiltiy and slow rate, small bites, and single sips while dining. Pt reportedly has been practicing with some success; however, further education and instruction needed to promote accuracy and carryover of learned techniques. Given recent changes in speech and swallowing with progression of MS, SLP recommends skilled ST intervention to address deficits to improve pt's safety during mealtimes, decrease risk of aspiration, and optimize communication effectiveness for overall improved QOL.    Speech Therapy Frequency 2x / week    Duration 8 weeks   or 17 total visits   Treatment/Interventions Aspiration precaution training;Compensatory strategies;Oral motor exercises;Pharyngeal strengthening exercises;Cueing hierarchy;Functional tasks;Patient/family education;Diet toleration management by SLP;Environmental controls;Multimodal communcation approach;SLP instruction and feedback;Internal/external aids;Compensatory techniques;Language facilitation    Potential to Achieve Goals Fair    Potential Considerations Co-morbidities;Previous level of function;Ability to learn/carryover information;Cooperation/participation level    SLP Home Exercise Plan provided    Consulted and Agree with Plan of Care Patient           Patient will benefit from skilled therapeutic intervention in order to improve the following deficits  and impairments:   Dysarthria and anarthria  Dysphagia, oral phase    Problem List Patient Active Problem List   Diagnosis Date Noted  . Multiple sclerosis ([place]) 10/11/2020  . MRI of brain abnormal 10/11/2020  . Right sided weakness 10/11/2020  . Gait disturbance 10/11/2020  . Asperger syndrome 03/19/2013    Alinda Deem, MA CCC-SLP 12/04/2020, 4:25 PM  [facility] [address] [place], [place], [address] Phone: [phone]   Fax:  [phone]   Name: George [name] MRN: [id number] Date of Birth: [birth year]

## 2020-12-04 NOTE — Patient Instructions (Signed)
   Drink small, single sips throughout the day, instead of drinking whole glass at end of day.  Strategies: read slowly and move your mouth big  Swallow your saliva (stay relaxed)

## 2020-12-06 ENCOUNTER — Other Ambulatory Visit: Payer: Self-pay

## 2020-12-06 ENCOUNTER — Encounter: Payer: Self-pay | Admitting: Speech Pathology

## 2020-12-06 ENCOUNTER — Telehealth: Payer: Self-pay | Admitting: Neurology

## 2020-12-06 ENCOUNTER — Ambulatory Visit: Payer: PPO | Admitting: Speech Pathology

## 2020-12-06 DIAGNOSIS — R471 Dysarthria and anarthria: Secondary | ICD-10-CM

## 2020-12-06 DIAGNOSIS — R1311 Dysphagia, oral phase: Secondary | ICD-10-CM

## 2020-12-06 DIAGNOSIS — F840 Autistic disorder: Secondary | ICD-10-CM | POA: Diagnosis not present

## 2020-12-06 NOTE — Therapy (Signed)
[facility] [address] [place], [place], [address] Phone: [phone]   Fax:  [phone]  Speech Language Pathology Treatment  Patient Details  Name: MATYAS BAISLEY MRN: [id number] Date of Birth: May 08, 1971 Referring Provider (SLP): Lavone Orn MD   Encounter Date: 12/06/2020   End of Session - 12/06/20 1207    Visit Number 3    Number of Visits 17    Date for SLP Re-Evaluation 03/01/21    SLP Start Time 1103    SLP Stop Time  1145    SLP Time Calculation (min) 42 min    Activity Tolerance Patient tolerated treatment well           Past Medical History:  Diagnosis Date  . Anxiety   . Asperger's syndrome   . Autistic spectrum disorder    Asperger's (per H&P [facility])  . Bipolar 1 disorder ([place])    "no mood swings in 20 years"  on medication  . GERD (gastroesophageal reflux disease)   . Head injury 2020  . Hypertension   . Hypothyroidism   . IBS (irritable bowel syndrome)     Past Surgical History:  Procedure Laterality Date  . NO PAST SURGERIES    . RADIOLOGY WITH ANESTHESIA N/A 05/16/2020   Procedure: MRI BRAIN WITH AND WITHOUT CONTRAST;  Surgeon: Radiologist, Medication, MD;  Location: [facility];  Service: Radiology;  Laterality: N/A;  . RADIOLOGY WITH ANESTHESIA N/A 08/29/2020   Procedure: MRI WITH ANESTHESIA CERVICAL SPINE WITH AND WITHOUT CONTRAST, THORACIC SPINE WITH AND WITHOUT CONTRAST;  Surgeon: Radiologist, Medication, MD;  Location: [facility];  Service: Radiology;  Laterality: N/A;    There were no vitals filed for this visit.   Subjective Assessment - 12/06/20 1109    Subjective "I got an app which helps" re: reading    Currently in Pain? No/denies                 ADULT SLP TREATMENT - 12/06/20 1111      General Information   Behavior/Cognition Alert;Cooperative;Pleasant mood;Impulsive;Distractible;Requires cueing      Treatment Provided   Treatment provided  Cognitive-Linquistic      Dysphagia Treatment   Temperature Spikes Noted No    Respiratory Status Room air    Oral Cavity - Dentition Adequate natural dentition      Cognitive-Linquistic Treatment   Treatment focused on Dysarthria;Patient/family/caregiver education    Skilled Treatment Richardson Landry has carried over HEP for dysarthria and practiced swallowing more frequently in converstion. Today targeted carryover of slow rate and over articulation and frequent swallowing in conversation with occasional min A. Trained pt in strategy of pre-practcing things he plans on saying on phone calls and conversations using slow, over articulated speech prior to threse conversations.Richardson Landry endorses that when he is frustrated or stressed his speech speeds up and he has difficulty turn taking in convesation. We discussed how pre-planning and making a mental run through of how he predicts a conversation may go, in particular his phone call with [facility],  to help him be more prepared and able to carryover an appropriate rate and volume. Encouraged him to mentally run through several possible scenarios of the course of a converstation. He does not want to disclose he has Asperbergers as a self advocating strategy as he feels others are put off by this and don't want to attempt to connect with him.      Assessment / Recommendations / Plan   Plan Continue  with current plan of care      Progression Toward Goals   Progression toward goals Progressing toward goals            SLP Education - 12/06/20 1201    Education Details prepractice utterances you want to say in a call or conversation, compensations for dysarthria; HEP    Person(s) Educated Patient    Methods Explanation;Demonstration;Verbal cues;Handout    Comprehension Verbalized understanding;Returned demonstration;Verbal cues required;Need further instruction            SLP Short Term Goals - 12/06/20 1206      SLP SHORT TERM GOAL #1   Title Pt will  complete HEP with min A over 3 sessions    Time 4    Period Weeks    Status On-going      SLP SHORT TERM GOAL #2   Title Pt will demo use of speech compensation on structured speech tasks with usual min A over 3 sessions    Baseline 12-04-20    Time 4    Period Weeks    Status On-going      SLP SHORT TERM GOAL #3   Title Pt will demonstrate use of speech compensations in simple 5 minute conversation with usual min A over 3 sessions    Time 4    Period Weeks    Status On-going      SLP SHORT TERM GOAL #4   Title Pt will verbalize and demonstrate recommended swallow strategies to reduce s/sx of aspiration with occasional min A over 3 sessions    Time 4    Period Weeks    Status On-going      SLP SHORT TERM GOAL #5   Title Pt will swallow saliva instead of wiping mouth for 4/5 opportunities with min A over 3 sessions    Baseline 12-04-20    Time 4    Period Weeks    Status On-going            SLP Long Term Goals - 12/06/20 1206      SLP LONG TERM GOAL #1   Title Pt will complete HEP with rare min A over 3 sessions    Time 8    Period Weeks    Status On-going      SLP LONG TERM GOAL #2   Title Pt will demonstrate use of speech compensations in simple 15 minute conversation with occasional min A over 2 sessions    Time 8    Period Weeks    Status On-going      SLP LONG TERM GOAL #3   Title Pt will utilize swallow strategies to decrease s/sx of aspiration with rare min A over 2 sessions    Time 8    Period Weeks    Status On-going      SLP LONG TERM GOAL #4   Title Pt will report improved communication effectiveness via QOL scale by 3 points by last ST session    Time 8    Period Weeks    Status On-going      SLP LONG TERM GOAL #5   Title Pt will participate in objective swallow study (pending MD order) to evaluate pharyngeal function and assess for risk of aspiration if s/sx of aspiration persist    Time 8    Period Weeks    Status On-going             Plan - 12/06/20 1202    Clinical Impression  Statement Richardson Landry continues to present with mild dysarthria and dysphagia as well as social communication impairments. He has carried over HEP for dysarthira, which we will continue to modify as he progresses. Richardson Landry also requires cues and some redirection for hyperverbosity which he is aware of.Richardson Landry is very motivated We continue to collaborate and generate strategies to improve communication. Recommend continue skilled ST to maximize safety of swallow, intelligibility and personal interactions.    Speech Therapy Frequency 2x / week    Duration 8 weeks   17 visits   Treatment/Interventions Aspiration precaution training;Compensatory strategies;Oral motor exercises;Pharyngeal strengthening exercises;Cueing hierarchy;Functional tasks;Patient/family education;Diet toleration management by SLP;Environmental controls;Multimodal communcation approach;SLP instruction and feedback;Internal/external aids;Compensatory techniques;Language facilitation    Potential to Achieve Goals Good           Patient will benefit from skilled therapeutic intervention in order to improve the following deficits and impairments:   Dysarthria and anarthria  Dysphagia, oral phase    Problem List Patient Active Problem List   Diagnosis Date Noted  . Multiple sclerosis ([place]) 10/11/2020  . MRI of brain abnormal 10/11/2020  . Right sided weakness 10/11/2020  . Gait disturbance 10/11/2020  . Asperger syndrome 03/19/2013    Reshunda Strider, [place], CCC-SLP 12/06/2020, 12:07 PM  [facility] [address] [place], [place], [address] Phone: [phone]   Fax:  [phone]   Name: DWAYN MORAVEK MRN: [id number] Date of Birth: [birth year]

## 2020-12-06 NOTE — Patient Instructions (Signed)
  We are going to [employer] on Saturday May 14 4:00   Practice saying things that you would normally say and pre-practice what you want to say before a phone call or meeting in a slow big manner  When you think of making friends, check out the [employer] website and maybe watch some videos of theirs for tips - make sure its for adults, dating, work place     Speech exercises - do 5x each, x2-3/day SLOW BIG  SAY THE FOLLOWING- make every sound! Red leather, yellow leather     Purple baby carriage    [place] [employer] [place] copper coffee pot Ripe purple cabbage Three free throws [place] [employer], Blue Bulb Flash Message Six Thick Thistles Stick Double Bubble Gum Cinnamon aluminum linoleum Black bugs blood Lovely lemon linament Tying Tape Takes Time A Shifty Salt Shaker   Four floors to cover Unique [place] A Three Toed Tree Toad Knapsack Strap Snap Rubber Baby Buggy Bumpers [place]  [place] Sailed the [place] Which Wrist Watches are [place] Wrist Watches

## 2020-12-06 NOTE — Telephone Encounter (Signed)
Called and spoke with pt. Scheduled work in visit for 12/20/20 at 1:00pm w/ Dr. Felecia Shelling. He verbalized understanding.

## 2020-12-06 NOTE — Telephone Encounter (Signed)
Pt asking for a call to discuss him worsening.  Pt states he gets tired quicker.  Pt states it is harder to walk and talk.  Please call to discuss

## 2020-12-13 ENCOUNTER — Encounter: Payer: PPO | Admitting: Speech Pathology

## 2020-12-18 ENCOUNTER — Other Ambulatory Visit: Payer: Self-pay

## 2020-12-18 ENCOUNTER — Ambulatory Visit: Payer: PPO

## 2020-12-18 DIAGNOSIS — R471 Dysarthria and anarthria: Secondary | ICD-10-CM

## 2020-12-18 NOTE — Therapy (Signed)
[facility] [address] [place], [place], [address] Phone: [phone]   Fax:  [phone]  Speech Language Pathology Treatment  Patient Details  Name: George [name] MRN: [id number] Date of Birth: 22-Jan-1971 Referring Provider (SLP): Lavone Orn MD   Encounter Date: 12/18/2020   End of Session - 12/18/20 1537    Visit Number 4    Number of Visits 17    Date for SLP Re-Evaluation 03/01/21    SLP Start Time 1448    SLP Stop Time  1615    SLP Time Calculation (min) 42 min    Activity Tolerance Other (comment)   frustration, emotional          Past Medical History:  Diagnosis Date  . Anxiety   . Asperger's syndrome   . Autistic spectrum disorder    Asperger's (per H&P [facility])  . Bipolar 1 disorder ([place])    "no mood swings in 20 years"  on medication  . GERD (gastroesophageal reflux disease)   . Head injury 2020  . Hypertension   . Hypothyroidism   . IBS (irritable bowel syndrome)     Past Surgical History:  Procedure Laterality Date  . NO PAST SURGERIES    . RADIOLOGY WITH ANESTHESIA N/A 05/16/2020   Procedure: MRI BRAIN WITH AND WITHOUT CONTRAST;  Surgeon: Radiologist, Medication, MD;  Location: [facility];  Service: Radiology;  Laterality: N/A;  . RADIOLOGY WITH ANESTHESIA N/A 08/29/2020   Procedure: MRI WITH ANESTHESIA CERVICAL SPINE WITH AND WITHOUT CONTRAST, THORACIC SPINE WITH AND WITHOUT CONTRAST;  Surgeon: Radiologist, Medication, MD;  Location: [facility];  Service: Radiology;  Laterality: N/A;    There were no vitals filed for this visit.   Subjective Assessment - 12/18/20 1535    Subjective "I was kinda loopy"    Currently in Pain? No/denies                 ADULT SLP TREATMENT - 12/18/20 1535      General Information   Behavior/Cognition Alert;Cooperative;Impulsive;Distractible;Requires cueing;Agitated   emotional     Treatment Provided   Treatment provided  Cognitive-Linquistic      Cognitive-Linquistic Treatment   Treatment focused on Dysarthria;Patient/family/caregiver education    Skilled Treatment Pt recently recovered from COVID-19, in which pt indicated some increased difficulty with swallowing ("force it go down") and processing emotions. Usual increased emotional discrepancies exhibited this session, requiring frequent breaks and SLP re-direction. SLP targeted use of compensations for slow rate and over-articulation on structured speech tasks, in which pt able to demo with occasional min A. Pt has been practicing reading aloud at home.      Assessment / Recommendations / Plan   Plan Continue with current plan of care      Progression Toward Goals   Progression toward goals Progressing toward goals            SLP Education - 12/18/20 1551    Education Details compensations, HEP    Person(s) Educated Patient    Methods Explanation;Demonstration;Handout    Comprehension Verbalized understanding;Returned demonstration;Need further instruction            SLP Short Term Goals - 12/18/20 1537      SLP SHORT TERM GOAL #1   Title Pt will complete HEP with min A over 3 sessions    Time 3    Period Weeks    Status On-going      SLP SHORT TERM GOAL #2   Title  Pt will demo use of speech compensation on structured speech tasks with usual min A over 3 sessions    Baseline 12-04-20, 12-18-20    Time 3    Period Weeks    Status On-going      SLP SHORT TERM GOAL #3   Title Pt will demonstrate use of speech compensations in simple 5 minute conversation with usual min A over 3 sessions    Time 3    Period Weeks    Status On-going      SLP SHORT TERM GOAL #4   Title Pt will verbalize and demonstrate recommended swallow strategies to reduce s/sx of aspiration with occasional min A over 3 sessions    Time 3    Period Weeks    Status On-going      SLP SHORT TERM GOAL #5   Title Pt will swallow saliva instead of wiping mouth for 4/5  opportunities with min A over 3 sessions    Baseline 12-04-20    Time 3    Period Weeks    Status On-going            SLP Long Term Goals - 12/18/20 1537      SLP LONG TERM GOAL #1   Title Pt will complete HEP with rare min A over 3 sessions    Time 7    Period Weeks    Status On-going      SLP LONG TERM GOAL #2   Title Pt will demonstrate use of speech compensations in simple 15 minute conversation with occasional min A over 2 sessions    Time 7    Period Weeks    Status On-going      SLP LONG TERM GOAL #3   Title Pt will utilize swallow strategies to decrease s/sx of aspiration with rare min A over 2 sessions    Time 7    Period Weeks    Status On-going      SLP LONG TERM GOAL #4   Title Pt will report improved communication effectiveness via QOL scale by 3 points by last ST session    Time 7    Period Weeks    Status On-going      SLP LONG TERM GOAL #5   Title Pt will participate in objective swallow study (pending MD order) to evaluate pharyngeal function and assess for risk of aspiration if s/sx of aspiration persist    Time 7    Period Weeks    Status On-going            Plan - 12/18/20 1606    Clinical Impression Statement George [name] continues to present with mild dysarthria and dysphagia as well as social communication impairments. Pt able to demo use of dysarthria compensations with occasional min A, particularly when emotional or excited. George [name] also requires cues and some redirection for hyperverbosity which he is aware of. George [name] is very motivated, therefore, we continue to collaborate and generate strategies to improve communication. Recommend continue skilled ST to maximize safety of swallow, intelligibility and personal interactions.    Speech Therapy Frequency 2x / week    Duration 8 weeks   or 17 total visits   Treatment/Interventions Aspiration precaution training;Compensatory strategies;Oral motor exercises;Pharyngeal strengthening exercises;Cueing  hierarchy;Functional tasks;Patient/family education;Diet toleration management by SLP;Environmental controls;Multimodal communcation approach;SLP instruction and feedback;Internal/external aids;Compensatory techniques;Language facilitation    Potential to Achieve Goals Good    Potential Considerations Co-morbidities;Previous level of function;Ability to learn/carryover information;Cooperation/participation level    SLP  Home Exercise Plan provided    Consulted and Agree with Plan of Care Patient           Patient will benefit from skilled therapeutic intervention in order to improve the following deficits and impairments:   Dysarthria and anarthria    Problem List Patient Active Problem List   Diagnosis Date Noted  . Multiple sclerosis ([place]) 10/11/2020  . MRI of brain abnormal 10/11/2020  . Right sided weakness 10/11/2020  . Gait disturbance 10/11/2020  . Asperger syndrome 03/19/2013    Alinda Deem, MA CCC-SLP 12/18/2020, 6:08 PM  [facility] [address] [place], [place], [address] Phone: [phone]   Fax:  [phone]   Name: George [name] MRN: [id number] Date of Birth: [birth year]

## 2020-12-20 ENCOUNTER — Other Ambulatory Visit: Payer: Self-pay

## 2020-12-20 ENCOUNTER — Encounter: Payer: Self-pay | Admitting: Neurology

## 2020-12-20 ENCOUNTER — Ambulatory Visit: Payer: PPO | Admitting: Neurology

## 2020-12-20 ENCOUNTER — Ambulatory Visit: Payer: PPO | Admitting: Speech Pathology

## 2020-12-20 ENCOUNTER — Encounter: Payer: Self-pay | Admitting: Speech Pathology

## 2020-12-20 VITALS — BP 122/79 | HR 71 | Ht 67.75 in | Wt 154.5 lb

## 2020-12-20 DIAGNOSIS — R471 Dysarthria and anarthria: Secondary | ICD-10-CM

## 2020-12-20 DIAGNOSIS — F840 Autistic disorder: Secondary | ICD-10-CM

## 2020-12-20 DIAGNOSIS — R269 Unspecified abnormalities of gait and mobility: Secondary | ICD-10-CM | POA: Diagnosis not present

## 2020-12-20 DIAGNOSIS — G35 Multiple sclerosis: Secondary | ICD-10-CM | POA: Diagnosis not present

## 2020-12-20 DIAGNOSIS — I10 Essential (primary) hypertension: Secondary | ICD-10-CM | POA: Diagnosis not present

## 2020-12-20 DIAGNOSIS — R531 Weakness: Secondary | ICD-10-CM

## 2020-12-20 NOTE — Patient Instructions (Signed)
   Try to get some practice in conversations if you can  When you are conversing, say 3-4 short things about yourself, then ask the person a "you" question  Some things people like to talk about:  Pets Family (siblings or parents) Hobbies (what do you like to do in your free time or for fun) Job  Conversations should be 3-5 short things, then the other person share 3-5 things and your right   [facility]   Consider computer work from home  Your volume is spot on - great job today  When you get excited you do need to think about slowing down

## 2020-12-20 NOTE — Progress Notes (Signed)
[facility]  PATIENT: George [name] DOB: July 20, 1971  REFERRING DOCTOR OR PCP: PCP is Lavone Orn.  Patient has seen Dr. Leta Baptist SOURCE: Patient, mother, notes from Dr. Leta Baptist, MRI imaging reports and lab reports.  MRI images personally reviewed  _________________________________   HISTORICAL  CHIEF COMPLAINT:  Chief Complaint  Patient presents with  . Follow-up    RM 12, alone. Last seen 10/11/2020. On glatiramer for MS. Having increased fatigue, issues w/ walking/talking.    HISTORY OF PRESENT ILLNESS:  George [name] is a 50 y.o. man withrelapsing remitting multiple sclerosis.  Update 12/20/2020: He started glatiramer acetate for the MS.   He tolerates it well.  He has adjusted the autoinjector setting with benefit.  MRIs are c/w MS.  He had an LP 09/13/2020 but there were no OCB.       He was not feeling good a few weeks ago but was found to have Covid-19.  At the time he had reduced appetite and didn't feel right.     He is following a strict diet (FODMAP diet) for irritable bowel.  On the diet he has much less abdominal pain.  He also finds it helps his mood.     Currently, he notes gait is mildly off.  Balance is off and he has stumbles and occasional falls.   His right foot sometimes drags and he trips over it at times, especially in the bathtub/shower.   He is getting grab bar.    He denies diplopia.   He notes he slightly slurs words and feels the right face is mildly weak.   He is working with a Transport planner.   He notes vision is fine.    Bladder function is fine though worse with caffeine.     He notes some fatigue but does better with B12 supplements.   Vit D was low and he is advised to take 2000 U daily   MS HISTORY: He has a h/o Asperger syndrome and bipolar disease.   In 2012, he had an MRI of the brain and was found to have multiple T2/FLAIR hyperintense foci in the brain.   A lumbar puncture at the time was negative.    Over the last year, he  began to have more difficulty with right hand coordination and left foot drop.    Also the right side of his face was drooping some.     He came in to see Dr. Leta Baptist and had repeat imaging studies showing progression of the white matter foci and the appearance of a small focus at C5.    Repeat LP, however, showed negative CSF (no OCB) again.   He started glatiramer 10/2020.    OTHER MEDICAL HISTORY He has bipolar disease (sees Dr. Toy Care).  He also has autism.   He is on Depakote.   He notes being more emotional and easily gets overwhelmed with emotions.   This had happened when he was younger but then got better again.      He has irritable bowel syndrome and takes B12 since he was told absorption may be affected.   B12 level was fine.     Imaging studies reviewed: MRI of the brain 11/21/2010 shows multiple T2/FLAIR hyperintense foci including a focus in the left pons, middle cerebellar peduncle, right thalamus.  Some foci of periventricular and some foci are juxtacortical.  None of the foci enhanced.  MRI of the brain 05/16/2020 showed a similar pattern at the 2012 MRI but  there has been mild progression with at least 2 new lesions noted in the white matter compared to the previous MRI.  As seen before there is a large focus involving the left greater than right pons.  It may be slightly larger.  There is also involvement of the middle cerebellar peduncle which is stable on the right but a new focus on the left.  There is one stable focus in the right thalamus.  MRI of the cervical spine 08/29/2020 showed a small focus posteriorly to the right adjacent to C5.  Mild DJD at C5-C6 not causing nerve root compression.  MRI of the thoracic spine 08/29/2020 showed a normal spinal cord (subtle focus at T10-T11 more likely artifact).  There are cystic lesions within the kidneys  Laboratory results. ANA, ANCA, HIV, hep B antibodies, hep C antibody were all negative or normal.  Vitamin D was mildly low.   Vitamin B12 was fine (elevated), creatinine was mildly elevated.  JCV antibody was positive at 1.3.  There were no oligoclonal bands in the CSF.  Protein was mildly elevated at 54.  REVIEW OF SYSTEMS: Constitutional: No fevers, chills, sweats, or change in appetite Eyes: No visual changes, double vision, eye pain Ear, nose and throat: No hearing loss, ear pain, nasal congestion, sore throat Cardiovascular: No chest pain, palpitations Respiratory: No shortness of breath at rest or with exertion.   No wheezes GastrointestinaI: No nausea, vomiting, diarrhea, abdominal pain, fecal incontinence Genitourinary: No dysuria, urinary retention or frequency.  No nocturia. Musculoskeletal: No neck pain, back pain Integumentary: No rash, pruritus, skin lesions Neurological: as above Psychiatric: No depression at this time.  No anxiety Endocrine: No palpitations, diaphoresis, change in appetite, change in weigh or increased thirst Hematologic/Lymphatic: No anemia, purpura, petechiae. Allergic/Immunologic: No itchy/runny eyes, nasal congestion, recent allergic reactions, rashes  ALLERGIES: Allergies  Allergen Reactions  . Other Other (See Comments)    Can only eat at home    HOME MEDICATIONS:  Current Outpatient Medications:  .  amLODipine (NORVASC) 5 MG tablet, Take 5 mg by mouth daily., Disp: , Rfl:  .  busPIRone (BUSPAR) 10 MG tablet, Take 20 mg by mouth 2 (two) times daily., Disp: , Rfl:  .  butalbital-acetaminophen-caffeine (FIORICET) 50-325-40 MG tablet, Take 1 tablet by mouth every 6 (six) hours as needed for headache., Disp: 10 tablet, Rfl: 0 .  cholecalciferol (VITAMIN D) 1000 units tablet, Take 1,000 Units by mouth 2 (two) times a week., Disp: , Rfl:  .  CHOLINE PO, Take 300 mg by mouth., Disp: , Rfl:  .  Cyanocobalamin (VITAMIN B-12) 5000 MCG TBDP, Place 5,000 Units under the tongue daily., Disp: , Rfl:  .  divalproex (DEPAKOTE) 250 MG DR tablet, Take 1,250 mg by mouth daily. ,  Disp: , Rfl:  .  Glatiramer Acetate (COPAXONE) 40 MG/ML SOSY, Inject 40 mg into the skin 3 (three) times a week., Disp: 11.76 mL, Rfl: 12 .  SYNTHROID 100 MCG tablet, Take 100 mcg by mouth daily before breakfast. , Disp: , Rfl:  .  testosterone enanthate (DELATESTRYL) 200 MG/ML injection, Inject 50-200 mg into the muscle See admin instructions. For IM use only  Every three weeks, Disp: , Rfl:   PAST MEDICAL HISTORY: Past Medical History:  Diagnosis Date  . Anxiety   . Asperger's syndrome   . Autistic spectrum disorder    Asperger's (per H&P [facility])  . Bipolar 1 disorder ([place])    "no mood swings in 20 years"  on  medication  . GERD (gastroesophageal reflux disease)   . Head injury 2020  . Hypertension   . Hypothyroidism   . IBS (irritable bowel syndrome)     PAST SURGICAL HISTORY: Past Surgical History:  Procedure Laterality Date  . NO PAST SURGERIES    . RADIOLOGY WITH ANESTHESIA N/A 05/16/2020   Procedure: MRI BRAIN WITH AND WITHOUT CONTRAST;  Surgeon: Radiologist, Medication, MD;  Location: [place];  Service: Radiology;  Laterality: N/A;  . RADIOLOGY WITH ANESTHESIA N/A 08/29/2020   Procedure: MRI WITH ANESTHESIA CERVICAL SPINE WITH AND WITHOUT CONTRAST, THORACIC SPINE WITH AND WITHOUT CONTRAST;  Surgeon: Radiologist, Medication, MD;  Location: [place];  Service: Radiology;  Laterality: N/A;    FAMILY HISTORY: Family History  Problem Relation Age of Onset  . Hypertension Father   . Depression Sister   . Hypertension Paternal Grandfather     SOCIAL HISTORY:  Social History   Socioeconomic History  . Marital status: Single    Spouse name: Not on file  . Number of children: 0  . Years of education: college  . Highest education level: Not on file  Occupational History    Comment: artist  Tobacco Use  . Smoking status: Never Smoker  . Smokeless tobacco: Never Used  Vaping Use  . Vaping Use: Never used  Substance and Sexual Activity  . Alcohol use: No  .  Drug use: No  . Sexual activity: Not on file  Other Topics Concern  . Not on file  Social History Narrative   He lives at home.    He is single and has no children.     He denies use of tobacco, alcohol, illicit drugs and caffeine.    He works as an Training and development officer.    Oncologist: Not on [employer] Insecurity: Not on file  Transportation Needs: Not on file  Physical Activity: Not on file  Stress: Not on file  Social Connections: Not on file  Intimate Partner Violence: Not on file     PHYSICAL EXAM  Vitals:   12/20/20 1253  BP: 122/79  Pulse: 71  Weight: 154 lb 8 oz (70.1 kg)  Height: 5' 7.75" (1.721 m)    Body mass index is 23.67 kg/m.   General: The patient is well-developed and well-nourished and in no acute distress  HEENT:  Head is /AT.  Sclera are anicteric.   Neck:  The neck is nontender.  Skin: Extremities are without rash or  edema.   Neurologic Exam  Mental status: The patient is alert and oriented x 3 at the time of the examination. The patient has apparent normal recent and remote memory, with an apparently normal attention span and concentration ability.   Speech is normal.  Cranial nerves: Extraocular movements are full.  .  He has mild weakness in the right lower face (normal forehead).   There is good facial sensation to soft touch bilaterally. Facial strength is normal.  Trapezius and sternocleidomastoid strength is normal. No dysarthria is noted.    No obvious hearing deficits are noted.  Motor:  Muscle bulk is normal.   Tone is normal. Strength is  5 / 5 in left side but 5-/5 right arm (mildly reduced RAM)  4+/5 right  leg  Sensory: Sensory testing is intact to pinprick, soft touch and vibration sensation in all 4 extremities.  Coordination: Cerebellar testing reveals mildly reduced coordination right hand and foot.   Reduced heel-to-shin.  Gait  and station: Station is normal.   Gait shows a mild right  foot.  Tandem gait is wide.. Romberg is negative.   Reflexes: Deep tendon reflexes are symmetric and normal in arms and increased right leg.        DIAGNOSTIC DATA (LABS, IMAGING, TESTING) - I reviewed patient records, labs, notes, testing and imaging myself where available.  Lab Results  Component Value Date   WBC 7.1 09/11/2020   HGB 17.4 09/11/2020   HCT 52.4 (H) 09/11/2020   MCV 85 09/11/2020   PLT 206 09/11/2020      Component Value Date/Time   NA 142 09/11/2020 1035   K 4.6 09/11/2020 1035   CL 101 09/11/2020 1035   CO2 21 09/11/2020 1035   GLUCOSE 82 09/11/2020 1035   GLUCOSE 88 08/29/2020 1019   BUN 16 09/11/2020 1035   CREATININE 1.33 (H) 09/11/2020 1035   CREATININE 1.14 03/04/2014 1310   CALCIUM 9.9 09/11/2020 1035   PROT 7.3 09/11/2020 1035   ALBUMIN 4.6 09/11/2020 1035   AST 16 09/11/2020 1035   ALT 20 09/11/2020 1035   ALKPHOS 67 09/11/2020 1035   BILITOT 0.3 09/11/2020 1035   GFRNONAA 62 09/11/2020 1035   GFRNONAA >60 08/29/2020 1019   GFRAA 72 09/11/2020 1035   No results found for: CHOL, HDL, LDLCALC, LDLDIRECT, TRIG, CHOLHDL Lab Results  Component Value Date   HGBA1C 5.4 09/11/2020   Lab Results  Component Value Date   VITAMINB12 >2000 (H) 09/11/2020   Lab Results  Component Value Date   TSH 1.400 09/11/2020       ASSESSMENT AND PLAN  Multiple sclerosis (HCC)  Gait disturbance  Right sided weakness  Autism  1.   Continue glatiramer. 2.   Stay active and exercie as tolerated. 3.   return to see [place] in 4 months or sooner for new or worsening neurologic symptoms.   Aqeel Norgaard A. Felecia Shelling, MD, [facility] [id number], [id number] PM Certified in Neurology, Clinical Neurophysiology, Sleep Medicine and Neuroimaging  [facility] [address] [place], [place] [address] [phone]

## 2020-12-20 NOTE — Therapy (Signed)
[facility] [address] [place], [place], [address] Phone: [phone]   Fax:  [phone]  Speech Language Pathology Treatment  Patient Details  Name: George [name] MRN: [id number] Date of Birth: 1971/08/01 Referring Provider (SLP): Lavone Orn MD   Encounter Date: 12/20/2020   End of Session - 12/20/20 1507    Visit Number 5    Number of Visits 17    Date for SLP Re-Evaluation 03/01/21    SLP Start Time 1103    SLP Stop Time  7253    SLP Time Calculation (min) 42 min           Past Medical History:  Diagnosis Date  . Anxiety   . Asperger's syndrome   . Autistic spectrum disorder    Asperger's (per H&P [facility])  . Bipolar 1 disorder ([place])    "no mood swings in 20 years"  on medication  . GERD (gastroesophageal reflux disease)   . Head injury 2020  . Hypertension   . Hypothyroidism   . IBS (irritable bowel syndrome)     Past Surgical History:  Procedure Laterality Date  . NO PAST SURGERIES    . RADIOLOGY WITH ANESTHESIA N/A 05/16/2020   Procedure: MRI BRAIN WITH AND WITHOUT CONTRAST;  Surgeon: Radiologist, Medication, MD;  Location: [place];  Service: Radiology;  Laterality: N/A;  . RADIOLOGY WITH ANESTHESIA N/A 08/29/2020   Procedure: MRI WITH ANESTHESIA CERVICAL SPINE WITH AND WITHOUT CONTRAST, THORACIC SPINE WITH AND WITHOUT CONTRAST;  Surgeon: Radiologist, Medication, MD;  Location: [place];  Service: Radiology;  Laterality: N/A;    There were no vitals filed for this visit.   Subjective Assessment - 12/20/20 1108    Subjective "Can I have one of your masks"    Currently in Pain? No/denies                 ADULT SLP TREATMENT - 12/20/20 1108      General Information   Behavior/Cognition Alert;Cooperative;Impulsive;Distractible;Requires cueing;Agitated      Treatment Provided   Treatment provided Cognitive-Linquistic      Cognitive-Linquistic Treatment   Treatment  focused on Dysarthria;Patient/family/caregiver education    Skilled Treatment Pt enters room with rapid speech rate. Initial cues to reduce rate, however in structured sentence genreating task, George [name] used appropriate rate with supervision cues. In conversation, he continues to rerquire occasional verbal cues to reduce rate of speech for intelligibility. He is questioning his volume. Sound level meter palced 30cm from his mouth averaged 72dB in simple conversation. When conversation became emotional or stressed, volume raised to 75dB. (70dB is WNL). George [name] ID'd louder volume with mod I and self corrected. Encouraged George [name] to practice compensations for dysarthria with a variety of communication partners.      Assessment / Recommendations / Plan   Plan Continue with current plan of care      Progression Toward Goals   Progression toward goals Progressing toward goals            SLP Education - 12/20/20 1505    Education Details HEP, compensations, social communication    Person(s) Educated Patient    Methods Explanation;Demonstration;Verbal cues;Handout    Comprehension Verbalized understanding;Returned demonstration;Verbal cues required;Need further instruction            SLP Short Term Goals - 12/20/20 1507      SLP SHORT TERM GOAL #1   Title Pt will complete HEP with min A over 3 sessions  Time 3    Period Weeks    Status On-going      SLP SHORT TERM GOAL #2   Title Pt will demo use of speech compensation on structured speech tasks with usual min A over 3 sessions    Baseline 12-04-20, 12-18-20    Time 3    Period Weeks    Status On-going      SLP SHORT TERM GOAL #3   Title Pt will demonstrate use of speech compensations in simple 5 minute conversation with usual min A over 3 sessions    Time 3    Period Weeks    Status On-going      SLP SHORT TERM GOAL #4   Title Pt will verbalize and demonstrate recommended swallow strategies to reduce s/sx of aspiration with  occasional min A over 3 sessions    Time 3    Period Weeks    Status On-going      SLP SHORT TERM GOAL #5   Title Pt will swallow saliva instead of wiping mouth for 4/5 opportunities with min A over 3 sessions    Baseline 12-04-20    Time 3    Period Weeks    Status On-going            SLP Long Term Goals - 12/20/20 1507      SLP LONG TERM GOAL #1   Title Pt will complete HEP with rare min A over 3 sessions    Time 7    Period Weeks    Status On-going      SLP LONG TERM GOAL #2   Title Pt will demonstrate use of speech compensations in simple 15 minute conversation with occasional min A over 2 sessions    Time 7    Period Weeks    Status On-going      SLP LONG TERM GOAL #3   Title Pt will utilize swallow strategies to decrease s/sx of aspiration with rare min A over 2 sessions    Time 7    Period Weeks    Status On-going      SLP LONG TERM GOAL #4   Title Pt will report improved communication effectiveness via QOL scale by 3 points by last ST session    Time 7    Period Weeks    Status On-going      SLP LONG TERM GOAL #5   Title Pt will participate in objective swallow study (pending MD order) to evaluate pharyngeal function and assess for risk of aspiration if s/sx of aspiration persist    Time 7    Period Weeks    Status On-going            Plan - 12/20/20 1506    Clinical Impression Statement George [name] continues to present with mild dysarthria and dysphagia as well as social communication impairments. Pt able to demo use of dysarthria compensations with occasional min A, particularly when emotional or excited. George [name] also requires cues and some redirection for hyperverbosity which he is aware of. George [name] is very motivated, therefore, we continue to collaborate and generate strategies to improve communication. Recommend continue skilled ST to maximize safety of swallow, intelligibility and personal interactions.    Speech Therapy Frequency 2x / week    Duration 8 weeks    17 visits   Treatment/Interventions Aspiration precaution training;Compensatory strategies;Oral motor exercises;Pharyngeal strengthening exercises;Cueing hierarchy;Functional tasks;Patient/family education;Diet toleration management by SLP;Environmental controls;Multimodal communcation approach;SLP instruction and feedback;Internal/external aids;Compensatory techniques;Language facilitation  Potential Considerations Co-morbidities;Previous level of function;Ability to learn/carryover information;Cooperation/participation level           Patient will benefit from skilled therapeutic intervention in order to improve the following deficits and impairments:   Dysarthria and anarthria    Problem List Patient Active Problem List   Diagnosis Date Noted  . Autism 12/20/2020  . Multiple sclerosis ([place]) 10/11/2020  . MRI of brain abnormal 10/11/2020  . Right sided weakness 10/11/2020  . Gait disturbance 10/11/2020  . Asperger syndrome 03/19/2013    [place], Annye Rusk MS, CCC-SLP 12/20/2020, 3:08 PM  [facility] [address] [place], [place], [address] Phone: [phone]   Fax:  [phone]   Name: George [name] MRN: [id number] Date of Birth: 1971-05-12

## 2020-12-25 ENCOUNTER — Other Ambulatory Visit: Payer: Self-pay

## 2020-12-25 ENCOUNTER — Ambulatory Visit: Payer: PPO

## 2020-12-25 DIAGNOSIS — R471 Dysarthria and anarthria: Secondary | ICD-10-CM | POA: Diagnosis not present

## 2020-12-25 DIAGNOSIS — R41841 Cognitive communication deficit: Secondary | ICD-10-CM

## 2020-12-25 NOTE — Therapy (Signed)
[facility] [address] [place], [place], [address] Phone: [phone]   Fax:  [phone]  Speech Language Pathology Treatment  Patient Details  Name: George [name] MRN: [id number] Date of Birth: May 19, 1971 Referring Provider (SLP): Lavone Orn MD   Encounter Date: 12/25/2020   End of Session - 12/25/20 1537    Visit Number 6    Number of Visits 17    Date for SLP Re-Evaluation 03/01/21    SLP Start Time 58    SLP Stop Time  1615    SLP Time Calculation (min) 41 min    Activity Tolerance Patient tolerated treatment well           Past Medical History:  Diagnosis Date  . Anxiety   . Asperger's syndrome   . Autistic spectrum disorder    Asperger's (per [facility])  . Bipolar 1 disorder ([place])    "no mood swings in 20 years"  on medication  . GERD (gastroesophageal reflux disease)   . Head injury 2020  . Hypertension   . Hypothyroidism   . IBS (irritable bowel syndrome)     Past Surgical History:  Procedure Laterality Date  . NO PAST SURGERIES    . RADIOLOGY WITH ANESTHESIA N/A 05/16/2020   Procedure: MRI BRAIN WITH AND WITHOUT CONTRAST;  Surgeon: Radiologist, Medication, MD;  Location: [facility];  Service: Radiology;  Laterality: N/A;  . RADIOLOGY WITH ANESTHESIA N/A 08/29/2020   Procedure: MRI WITH ANESTHESIA CERVICAL SPINE WITH AND WITHOUT CONTRAST, THORACIC SPINE WITH AND WITHOUT CONTRAST;  Surgeon: Radiologist, Medication, MD;  Location: [facility];  Service: Radiology;  Laterality: N/A;    There were no vitals filed for this visit.   Subjective Assessment - 12/25/20 1535    Subjective "I've been practicing"    Currently in Pain? No/denies                 ADULT SLP TREATMENT - 12/25/20 1536      General Information   Behavior/Cognition Alert;Cooperative;Impulsive;Distractible;Requires cueing;Agitated      Treatment Provided   Treatment provided Cognitive-Linquistic       Cognitive-Linquistic Treatment   Treatment focused on Dysarthria;Patient/family/caregiver education    Skilled Treatment Pt reports he has been practicing speech compensations with his mother as well as reading aloud on his videogames. SLP targeted dysarthria compensations in conversation, in which pt required occasional non-verbal cues to slow rate of compensations. Occasional self-corrections and improving  awareness of rate exhibited this session. Emotional topics impacted carryover of compensations in conversation, in which pt demonstrated awareness but required additional cues. Of note, pt reports adequate swallow function with use of small bites/sips and slow rate.      Assessment / Recommendations / Plan   Plan Continue with current plan of care      Progression Toward Goals   Progression toward goals Progressing toward goals            SLP Education - 12/25/20 1618    Education Details compensations, social communication- Brewing technologist) Educated Patient    Methods Explanation;Demonstration;Handout    Comprehension Verbalized understanding;Returned demonstration;Need further instruction            SLP Short Term Goals - 12/25/20 1540      SLP SHORT TERM GOAL #1   Title Pt will complete HEP with min A over 3 sessions    Baseline 12-25-20    Time 2    Period Weeks  Status On-going      SLP SHORT TERM GOAL #2   Title Pt will demo use of speech compensation on structured speech tasks with usual min A over 3 sessions    Baseline 12-04-20, 12-18-20    Time 2    Period Weeks    Status On-going      SLP SHORT TERM GOAL #3   Title Pt will demonstrate use of speech compensations in simple 5 minute conversation with usual min A over 3 sessions    Baseline 12-25-20    Time 2    Period Weeks    Status On-going      SLP SHORT TERM GOAL #4   Title Pt will verbalize and demonstrate recommended swallow strategies to reduce s/sx of aspiration with occasional min A over 3  sessions    Time 2    Period Weeks    Status On-going      SLP SHORT TERM GOAL #5   Title Pt will swallow saliva instead of wiping mouth for 4/5 opportunities with min A over 3 sessions    Baseline 12-04-20    Time 2    Period Weeks    Status On-going            SLP Long Term Goals - 12/25/20 1540      SLP LONG TERM GOAL #1   Title Pt will complete HEP with rare min A over 3 sessions    Time 6    Period Weeks    Status On-going      SLP LONG TERM GOAL #2   Title Pt will demonstrate use of speech compensations in simple 15 minute conversation with occasional min A over 2 sessions    Time 6    Period Weeks    Status On-going      SLP LONG TERM GOAL #3   Title Pt will utilize swallow strategies to decrease s/sx of aspiration with rare min A over 2 sessions    Time 6    Period Weeks    Status On-going      SLP LONG TERM GOAL #4   Title Pt will report improved communication effectiveness via QOL scale by 3 points by last ST session    Time 6    Period Weeks    Status On-going      SLP LONG TERM GOAL #5   Title Pt will participate in objective swallow study (pending MD order) to evaluate pharyngeal function and assess for risk of aspiration if s/sx of aspiration persist    Time 6    Period Weeks    Status On-going            Plan - 12/25/20 1844    Clinical Impression Statement George [name] continues to present with mild dysarthria and dysphagia as well as social communication impairments. Pt able to demo use of dysarthria compensations with occasional min A, particularly when emotional or excited. George [name] also requires cues and some redirection for hyperverbosity which he is aware of. George [name] is very motivated, therefore, we continue to collaborate and generate strategies to improve communication. Recommend continue skilled ST to maximize safety of swallow, intelligibility and personal interactions.    Speech Therapy Frequency 2x / week    Duration 8 weeks   or 17 total visits    Treatment/Interventions Aspiration precaution training;Compensatory strategies;Oral motor exercises;Pharyngeal strengthening exercises;Cueing hierarchy;Functional tasks;Patient/family education;Diet toleration management by SLP;Environmental controls;Multimodal communcation approach;SLP instruction and feedback;Internal/external aids;Compensatory techniques;Language facilitation    Potential to  Achieve Goals Good    Potential Considerations Co-morbidities;Previous level of function;Ability to learn/carryover information;Cooperation/participation level    SLP Home Exercise Plan provided    Consulted and Agree with Plan of Care Patient           Patient will benefit from skilled therapeutic intervention in order to improve the following deficits and impairments:   Dysarthria and anarthria  Cognitive communication deficit    Problem List Patient Active Problem List   Diagnosis Date Noted  . Autism 12/20/2020  . Multiple sclerosis ([place]) 10/11/2020  . MRI of brain abnormal 10/11/2020  . Right sided weakness 10/11/2020  . Gait disturbance 10/11/2020  . Asperger syndrome 03/19/2013    Alinda Deem, MA CCC-SLP 12/25/2020, 6:45 PM  [facility] [address] [place], [place], [address] Phone: [phone]   Fax:  [phone]   Name: George [name] MRN: [id number] Date of Birth: April 20, 1971

## 2020-12-27 ENCOUNTER — Encounter: Payer: Self-pay | Admitting: Speech Pathology

## 2020-12-27 ENCOUNTER — Ambulatory Visit: Payer: PPO | Admitting: Speech Pathology

## 2020-12-27 ENCOUNTER — Other Ambulatory Visit: Payer: Self-pay

## 2020-12-27 DIAGNOSIS — F840 Autistic disorder: Secondary | ICD-10-CM | POA: Diagnosis not present

## 2020-12-27 DIAGNOSIS — R471 Dysarthria and anarthria: Secondary | ICD-10-CM | POA: Diagnosis not present

## 2020-12-27 NOTE — Therapy (Signed)
[facility] [address] [place], [place], [address] Phone: [phone]   Fax:  [phone]  Speech Language Pathology Treatment  Patient Details  Name: George [name] MRN: [id number] Date of Birth: 11-16-1970 Referring Provider (SLP): Lavone Orn MD   Encounter Date: 12/27/2020   End of Session - 12/27/20 1315    Visit Number 7    Number of Visits 17    Date for SLP Re-Evaluation 03/01/21    SLP Start Time 4128    SLP Stop Time  1145    SLP Time Calculation (min) 43 min    Activity Tolerance Patient tolerated treatment well           Past Medical History:  Diagnosis Date  . Anxiety   . Asperger's syndrome   . Autistic spectrum disorder    Asperger's (per [facility])  . Bipolar 1 disorder ([place])    "no mood swings in 20 years"  on medication  . GERD (gastroesophageal reflux disease)   . Head injury 2020  . Hypertension   . Hypothyroidism   . IBS (irritable bowel syndrome)     Past Surgical History:  Procedure Laterality Date  . NO PAST SURGERIES    . RADIOLOGY WITH ANESTHESIA N/A 05/16/2020   Procedure: MRI BRAIN WITH AND WITHOUT CONTRAST;  Surgeon: Radiologist, Medication, MD;  Location: [facility];  Service: Radiology;  Laterality: N/A;  . RADIOLOGY WITH ANESTHESIA N/A 08/29/2020   Procedure: MRI WITH ANESTHESIA CERVICAL SPINE WITH AND WITHOUT CONTRAST, THORACIC SPINE WITH AND WITHOUT CONTRAST;  Surgeon: Radiologist, Medication, MD;  Location: [facility];  Service: Radiology;  Laterality: N/A;    There were no vitals filed for this visit.   Subjective Assessment - 12/27/20 1104    Subjective "I have to start coming here at 11:00" re: arriving early    Currently in Pain? No/denies                 ADULT SLP TREATMENT - 12/27/20 1105      General Information   Behavior/Cognition Alert;Cooperative;Impulsive;Distractible;Requires cueing;Agitated      Treatment Provided   Treatment  provided Cognitive-Linquistic      Pain Assessment   Pain Assessment No/denies pain      Cognitive-Linquistic Treatment   Treatment focused on Dysarthria;Patient/family/caregiver education    Skilled Treatment George [name] demonstrated HEP with rare min A, focusing on slow rate and pausing at commas and sentences. Educated George [name] that it is normal to feel awkward when you are learningn to slow your rate of speech. He reported rushing speech over the phone to save time for his listener. Instructed him that is fast rate is making his listner work too hard to understand him and that this is distracting from his message. In oral reading task with cues to "read slower that you feel comfortable doing" to recalibrate his rate, George [name] maintained slow rate with rare min A. In conversation he required 3 cues to reduce rate over 15 minutes.  Volume remained appropriate. He demosntrated HEP for dysarthria with rare min A. Instructed him to write down and practice what he wants to say on phone calls as external strategy to slow his rate and improve intelligilbity.      Assessment / Recommendations / Plan   Plan Continue with current plan of care      Progression Toward Goals   Progression toward goals Progressing toward goals            SLP  Education - 12/27/20 1312    Education Details compensations for dysarthria, HEP, external aids for phone calls    Person(s) Educated Patient    Methods Explanation;Demonstration;Handout;Verbal cues    Comprehension Verbalized understanding;Returned demonstration;Verbal cues required;Need further instruction            SLP Short Term Goals - 12/27/20 1314      SLP SHORT TERM GOAL #1   Title Pt will complete HEP with min A over 3 sessions    Baseline 12-25-20, 12-27-20    Time 2    Period Weeks    Status On-going      SLP SHORT TERM GOAL #2   Title Pt will demo use of speech compensation on structured speech tasks with usual min A over 3 sessions    Baseline  12-04-20, 12-18-20, 12-27-20    Time 2    Period Weeks    Status On-going      SLP SHORT TERM GOAL #3   Title Pt will demonstrate use of speech compensations in simple 5 minute conversation with usual min A over 3 sessions    Baseline 12-25-20, 12-27-20    Time 2    Period Weeks    Status On-going      SLP SHORT TERM GOAL #4   Title Pt will verbalize and demonstrate recommended swallow strategies to reduce s/sx of aspiration with occasional min A over 3 sessions    Time 2    Period Weeks    Status On-going      SLP SHORT TERM GOAL #5   Title Pt will swallow saliva instead of wiping mouth for 4/5 opportunities with min A over 3 sessions    Baseline 12-04-20    Time 2    Period Weeks    Status On-going            SLP Long Term Goals - 12/27/20 1315      SLP LONG TERM GOAL #1   Title Pt will complete HEP with rare min A over 3 sessions    Time 6    Period Weeks    Status On-going      SLP LONG TERM GOAL #2   Title Pt will demonstrate use of speech compensations in simple 15 minute conversation with occasional min A over 2 sessions    Time 6    Period Weeks    Status On-going      SLP LONG TERM GOAL #3   Title Pt will utilize swallow strategies to decrease s/sx of aspiration with rare min A over 2 sessions    Time 6    Period Weeks    Status On-going      SLP LONG TERM GOAL #4   Title Pt will report improved communication effectiveness via QOL scale by 3 points by last ST session    Time 6    Period Weeks    Status On-going      SLP LONG TERM GOAL #5   Title Pt will participate in objective swallow study (pending MD order) to evaluate pharyngeal function and assess for risk of aspiration if s/sx of aspiration persist    Time 6    Period Weeks    Status On-going            Plan - 12/27/20 1313    Clinical Impression Statement George [name] continues to present with mild dysarthria and dysphagia as well as social communication impairments. Pt able to demo use of dysarthria  compensations with  occasional min A, particularly when emotional or excited. George [name] also requires cues and some redirection for hyperverbosity which he is aware of. George [name] is very motivated, therefore, we continue to collaborate and generate strategies to improve communication. Recommend continue skilled ST to maximize safety of swallow, intelligibility and personal interactions.    Speech Therapy Frequency 2x / week    Duration 8 weeks   17 visits   Treatment/Interventions Aspiration precaution training;Compensatory strategies;Oral motor exercises;Pharyngeal strengthening exercises;Cueing hierarchy;Functional tasks;Patient/family education;Diet toleration management by SLP;Environmental controls;Multimodal communcation approach;SLP instruction and feedback;Internal/external aids;Compensatory techniques;Language facilitation    Potential to Achieve Goals Good    Potential Considerations Co-morbidities;Previous level of function;Ability to learn/carryover information;Cooperation/participation level           Patient will benefit from skilled therapeutic intervention in order to improve the following deficits and impairments:   Dysarthria and anarthria    Problem List Patient Active Problem List   Diagnosis Date Noted  . Autism 12/20/2020  . Multiple sclerosis ([place]) 10/11/2020  . MRI of brain abnormal 10/11/2020  . Right sided weakness 10/11/2020  . Gait disturbance 10/11/2020  . Asperger syndrome 03/19/2013    Alyce Inscore, Annye Rusk MS, CCC-SLP 12/27/2020, 1:22 PM  [facility] [address] [place], [place], [address] Phone: [phone]   Fax:  [phone]   Name: George [name] MRN: [id number] Date of Birth: 01-12-1971

## 2020-12-27 NOTE — Patient Instructions (Signed)
   It's normal to feel awkward or funny when you are learning to slow your rate of speech  Just know that when you slow down your speech, it actually sounds normal to your listener  Taking more frequent pauses also helps you slow down   When you talk fast, your listener has to work hard to understand you. This distracts your listener and may detract from your message  I like that you are thinking about your listener - just be aware that slowing down is how you are helping them  When you read for practice, read as slow has you can and take extra pauses at periods and commas  Pre-practice what you want to say going very slow before you make a call or have a conversation  When you have to call or have a conversation and are nervous about it, you will automatically speed up your speech. Before you do this, take a moment to read something very slowly to re-calibrate your speech.  Taking an extra second in a pause also gives your listener a chance to process your speech and understand you

## 2020-12-29 DIAGNOSIS — E291 Testicular hypofunction: Secondary | ICD-10-CM | POA: Diagnosis not present

## 2021-01-03 ENCOUNTER — Ambulatory Visit: Payer: PPO | Attending: Internal Medicine | Admitting: Speech Pathology

## 2021-01-03 ENCOUNTER — Other Ambulatory Visit: Payer: Self-pay

## 2021-01-03 ENCOUNTER — Encounter: Payer: Self-pay | Admitting: Speech Pathology

## 2021-01-03 DIAGNOSIS — R1311 Dysphagia, oral phase: Secondary | ICD-10-CM | POA: Insufficient documentation

## 2021-01-03 DIAGNOSIS — F840 Autistic disorder: Secondary | ICD-10-CM | POA: Diagnosis not present

## 2021-01-03 DIAGNOSIS — R471 Dysarthria and anarthria: Secondary | ICD-10-CM | POA: Diagnosis not present

## 2021-01-03 NOTE — Therapy (Signed)
[facility] [address] [place], [place], [address] Phone: [phone]   Fax:  [phone]  Speech Language Pathology Treatment  Patient Details  Name: George [name] MRN: [id number] Date of Birth: 1970/12/23 Referring Provider (SLP): Lavone Orn MD   Encounter Date: 01/03/2021   End of Session - 01/03/21 1153    Visit Number 8    Number of Visits 17    Date for SLP Re-Evaluation 03/01/21    SLP Start Time 8099    SLP Stop Time  1145    SLP Time Calculation (min) 43 min    Activity Tolerance Patient tolerated treatment well           Past Medical History:  Diagnosis Date  . Anxiety   . Asperger's syndrome   . Autistic spectrum disorder    Asperger's (per [facility])  . Bipolar 1 disorder ([place])    "no mood swings in 20 years"  on medication  . GERD (gastroesophageal reflux disease)   . Head injury 2020  . Hypertension   . Hypothyroidism   . IBS (irritable bowel syndrome)     Past Surgical History:  Procedure Laterality Date  . NO PAST SURGERIES    . RADIOLOGY WITH ANESTHESIA N/A 05/16/2020   Procedure: MRI BRAIN WITH AND WITHOUT CONTRAST;  Surgeon: Radiologist, Medication, MD;  Location: [facility];  Service: Radiology;  Laterality: N/A;  . RADIOLOGY WITH ANESTHESIA N/A 08/29/2020   Procedure: MRI WITH ANESTHESIA CERVICAL SPINE WITH AND WITHOUT CONTRAST, THORACIC SPINE WITH AND WITHOUT CONTRAST;  Surgeon: Radiologist, Medication, MD;  Location: [facility];  Service: Radiology;  Laterality: N/A;    There were no vitals filed for this visit.   Subjective Assessment - 01/03/21 1106    Subjective "I think my MS is acting up"    Currently in Pain? No/denies                 ADULT SLP TREATMENT - 01/03/21 1106      General Information   Behavior/Cognition Alert;Cooperative;Impulsive;Distractible;Requires cueing;Agitated      Treatment Provided   Treatment provided Cognitive-Linquistic       Cognitive-Linquistic Treatment   Treatment focused on Dysarthria;Patient/family/caregiver education    Skilled Treatment George [name] reports that he is being mindful to talk slower with his parents and carepenters working at his home. In structured speech task generating 3-4 sentece descriptions, George [name] maintained slow rate and intelligilbe speech with supervision cues. During 18 minute conversation, George [name] carried over slow rate to be 100% intelligilble in a quiet enviroment. He completes HEP for dysarthria with mod I      Assessment / Recommendations / Plan   Plan Continue with current plan of care      Progression Toward Goals   Progression toward goals Progressing toward goals            SLP Education - 01/03/21 1150    Education Details continue HEP for dysarthria, reduce distractions with meals    Person(s) Educated Patient    Methods Explanation;Demonstration;Handout    Comprehension Verbalized understanding;Returned demonstration;Need further instruction            SLP Short Term Goals - 01/03/21 1152      SLP SHORT TERM GOAL #1   Title Pt will complete HEP with min A over 3 sessions    Baseline 12-25-20, 12-27-20    Time 2    Period Weeks    Status Achieved  SLP SHORT TERM GOAL #2   Title Pt will demo use of speech compensation on structured speech tasks with usual min A over 3 sessions    Baseline 12-04-20, 12-18-20, 12-27-20; 01/03/21    Time 2    Period Weeks    Status Achieved      SLP SHORT TERM GOAL #3   Title Pt will demonstrate use of speech compensations in simple 5 minute conversation with usual min A over 3 sessions    Baseline 12-25-20, 12-27-20, 01/03/21    Time 2    Period Weeks    Status Achieved      SLP SHORT TERM GOAL #4   Title Pt will verbalize and demonstrate recommended swallow strategies to reduce s/sx of aspiration with occasional min A over 3 sessions    Baseline 01/03/21;    Time 1    Period Weeks    Status On-going      SLP SHORT TERM GOAL  #5   Title Pt will swallow saliva instead of wiping mouth for 4/5 opportunities with min A over 3 sessions    Baseline 12-04-20    Time 2    Period Weeks    Status On-going            SLP Long Term Goals - 01/03/21 1153      SLP LONG TERM GOAL #1   Title Pt will complete HEP with rare min A over 3 sessions    Baseline 01/03/21    Time 5    Period Weeks    Status On-going      SLP LONG TERM GOAL #2   Title Pt will demonstrate use of speech compensations in simple 15 minute conversation with occasional min A over 2 sessions    Time 5    Period Weeks    Status On-going      SLP LONG TERM GOAL #3   Title Pt will utilize swallow strategies to decrease s/sx of aspiration with rare min A over 2 sessions    Time 5    Period Weeks    Status On-going      SLP LONG TERM GOAL #4   Title Pt will report improved communication effectiveness via QOL scale by 3 points by last ST session    Time 5    Period Weeks    Status On-going      SLP LONG TERM GOAL #5   Title Pt will participate in objective swallow study (pending MD order) to evaluate pharyngeal function and assess for risk of aspiration if s/sx of aspiration persist    Time 5    Period Weeks    Status On-going            Plan - 01/03/21 1151    Clinical Impression Statement George [name] continues to present with mild dysarthria and dysphagia as well as social communication impairments. Pt able to demo use of dysarthria compensations with occasional min A, particularly when emotional or excited. George [name] also requires cues and some redirection for hyperverbosity which he is aware of. George [name] is very motivated, therefore, we continue to collaborate and generate strategies to improve communication. Recommend continue skilled ST to maximize safety of swallow, intelligibility and personal interactions.    Speech Therapy Frequency 2x / week    Duration 8 weeks   17 visits   Treatment/Interventions Aspiration precaution training;Compensatory  strategies;Oral motor exercises;Pharyngeal strengthening exercises;Cueing hierarchy;Functional tasks;Patient/family education;Diet toleration management by SLP;Environmental controls;Multimodal communcation approach;SLP instruction and feedback;Internal/external aids;Compensatory techniques;Language facilitation  Potential to Achieve Goals Good    Potential Considerations Co-morbidities;Previous level of function;Ability to learn/carryover information;Cooperation/participation level           Patient will benefit from skilled therapeutic intervention in order to improve the following deficits and impairments:   Dysarthria and anarthria  Dysphagia, oral phase    Problem List Patient Active Problem List   Diagnosis Date Noted  . Autism 12/20/2020  . Multiple sclerosis ([place]) 10/11/2020  . MRI of brain abnormal 10/11/2020  . Right sided weakness 10/11/2020  . Gait disturbance 10/11/2020  . Asperger syndrome 03/19/2013    Michelina Mexicano, Annye Rusk MS, CCC-SLP 01/03/2021, 11:54 AM  [name] [address] [place], [place], [address] Phone: [phone]   Fax:  [phone]   Name: ANIELLO CHRISTOPOULOS MRN: [id number] Date of Birth: [birth year]

## 2021-01-03 NOTE — Patient Instructions (Signed)
   When you are eating, be mindful of limiting distractions so you can focus on eating slowly   Good job practicing talking slowly with others - keep it up  Continue to read aloud slowly everyday

## 2021-01-10 ENCOUNTER — Ambulatory Visit: Payer: PPO | Admitting: Speech Pathology

## 2021-01-10 ENCOUNTER — Other Ambulatory Visit: Payer: Self-pay

## 2021-01-10 ENCOUNTER — Encounter: Payer: Self-pay | Admitting: Speech Pathology

## 2021-01-10 DIAGNOSIS — F840 Autistic disorder: Secondary | ICD-10-CM | POA: Diagnosis not present

## 2021-01-10 DIAGNOSIS — R1311 Dysphagia, oral phase: Secondary | ICD-10-CM

## 2021-01-10 DIAGNOSIS — R471 Dysarthria and anarthria: Secondary | ICD-10-CM | POA: Diagnosis not present

## 2021-01-10 NOTE — Therapy (Signed)
[facility] [address] [place], [place], [address] Phone: [phone]   Fax:  [phone]  Speech Language Pathology Treatment  Patient Details  Name: George [name] MRN: [id number] Date of Birth: 08-05-1971 Referring Provider (SLP): Lavone Orn MD   Encounter Date: 01/10/2021   End of Session - 01/10/21 0928    Visit Number 9    Number of Visits 17    Date for SLP Re-Evaluation 03/01/21    SLP Start Time 0843    SLP Stop Time  0930    SLP Time Calculation (min) 47 min    Activity Tolerance Patient tolerated treatment well           Past Medical History:  Diagnosis Date  . Anxiety   . Asperger's syndrome   . Autistic spectrum disorder    Asperger's (per H&P [facility])  . Bipolar 1 disorder ([place])    "no mood swings in 20 years"  on medication  . GERD (gastroesophageal reflux disease)   . Head injury 2020  . Hypertension   . Hypothyroidism   . IBS (irritable bowel syndrome)     Past Surgical History:  Procedure Laterality Date  . NO PAST SURGERIES    . RADIOLOGY WITH ANESTHESIA N/A 05/16/2020   Procedure: MRI BRAIN WITH AND WITHOUT CONTRAST;  Surgeon: Radiologist, Medication, MD;  Location: [facility];  Service: Radiology;  Laterality: N/A;  . RADIOLOGY WITH ANESTHESIA N/A 08/29/2020   Procedure: MRI WITH ANESTHESIA CERVICAL SPINE WITH AND WITHOUT CONTRAST, THORACIC SPINE WITH AND WITHOUT CONTRAST;  Surgeon: Radiologist, Medication, MD;  Location: [facility];  Service: Radiology;  Laterality: N/A;    There were no vitals filed for this visit.   Subjective Assessment - 01/10/21 0847    Subjective "Except when I get upset" re: speech    Currently in Pain? No/denies                 ADULT SLP TREATMENT - 01/10/21 0849      General Information   Behavior/Cognition Alert;Cooperative;Impulsive;Distractible;Requires cueing;Agitated      Treatment Provided   Treatment provided  Dysphagia;Cognitive-Linquistic      Dysphagia Treatment   Temperature Spikes Noted No    Respiratory Status Room air    Oral Cavity - Dentition Adequate natural dentition    Treatment Methods Skilled observation;Compensation strategy training    Patient observed directly with PO's Yes    Type of PO's observed Thin liquids    Feeding Able to feed self    Liquids provided via Cup    Pharyngeal Phase Signs & Symptoms Immediate cough   1/10 swallows as pt laughted while swallowing   Type of cueing Verbal    Amount of cueing Modified independent    Other treatment/comments Pt followed swallow precautions of slow rate with rare min A. 1 cough after talking/laughing while swallowing. Provided rationale for monitioring swallowing with MS, including risk of aspiration pna. Educated re s/s of dysphagia, if it progresses consider MBSS.      Cognitive-Linquistic Treatment   Treatment focused on Dysarthria;Patient/family/caregiver education    Skilled Treatment Delmar reports success carrying over compensations for dysarthria with his parents. In structured task Jihad carried over  slow rate and over articulation with rare min A to be 100%. He continues to have diffiuclty reducing rate of speech when he is excited or upset and he reports he notices he is talking too fast and spits, however he is not comfortable with others  giving him a signal he needs to slow down. In complex conversation, Thuan carried over intelligible speech with supervision cues and self corrected 2 episodes of rapid rate with supervision cues.      Assessment / Recommendations / Plan   Plan Continue with current plan of care      Progression Toward Goals   Progression toward goals Progressing toward goals            SLP Education - 01/10/21 0925    Education Details s/s of dysphagia, aspiration, swallow precautions, compensations for dysarthria    Person(s) Educated Patient    Methods Explanation;Demonstration;Verbal  cues;Handout    Comprehension Verbalized understanding;Returned demonstration;Need further instruction            SLP Short Term Goals - 01/10/21 0927      SLP SHORT TERM GOAL #1   Title Pt will complete HEP with min A over 3 sessions    Baseline 12-25-20, 12-27-20    Time 2    Period Weeks    Status Achieved      SLP SHORT TERM GOAL #2   Title Pt will demo use of speech compensation on structured speech tasks with usual min A over 3 sessions    Baseline 12-04-20, 12-18-20, 12-27-20; 01/03/21    Time 2    Period Weeks    Status Achieved      SLP SHORT TERM GOAL #3   Title Pt will demonstrate use of speech compensations in simple 5 minute conversation with usual min A over 3 sessions    Baseline 12-25-20, 12-27-20, 01/03/21    Time 2    Period Weeks    Status Achieved      SLP SHORT TERM GOAL #4   Title Pt will verbalize and demonstrate recommended swallow strategies to reduce s/sx of aspiration with occasional min A over 3 sessions    Baseline 01/03/21;    Time 1    Period Weeks    Status On-going      SLP SHORT TERM GOAL #5   Title Pt will swallow saliva instead of wiping mouth for 4/5 opportunities with min A over 3 sessions    Baseline 12-04-20    Time 2    Period Weeks    Status On-going            SLP Long Term Goals - 01/10/21 6295      SLP LONG TERM GOAL #1   Title Pt will complete HEP with rare min A over 3 sessions    Baseline 01/03/21    Time 4    Period Weeks    Status On-going      SLP LONG TERM GOAL #2   Title Pt will demonstrate use of speech compensations in simple 15 minute conversation with occasional min A over 2 sessions    Time 4    Period Weeks    Status On-going      SLP LONG TERM GOAL #3   Title Pt will utilize swallow strategies to decrease s/sx of aspiration with rare min A over 2 sessions    Time 4    Period Weeks    Status On-going      SLP LONG TERM GOAL #4   Title Pt will report improved communication effectiveness via QOL scale by 3  points by last ST session    Time 4    Period Weeks    Status On-going      SLP LONG TERM GOAL #5  Title Pt will participate in objective swallow study (pending MD order) to evaluate pharyngeal function and assess for risk of aspiration if s/sx of aspiration persist    Time 4    Period Weeks    Status On-going            Plan - 01/10/21 0926    Clinical Impression Statement Richardson Landry continues to present with mild dysarthria and dysphagia as well as social communication impairments. Pt able to demo use of dysarthria compensations with occasional min A, particularly when emotional or excited. Richardson Landry also requires cues and some redirection for hyperverbosity which he is aware of. Richardson Landry is very motivated, therefore, we continue to collaborate and generate strategies to improve communication. Recommend continue skilled ST to maximize safety of swallow, intelligibility and personal interactions.    Speech Therapy Frequency 2x / week    Duration 8 weeks   17 visits   Treatment/Interventions Aspiration precaution training;Compensatory strategies;Oral motor exercises;Pharyngeal strengthening exercises;Cueing hierarchy;Functional tasks;Patient/family education;Diet toleration management by SLP;Environmental controls;Multimodal communcation approach;SLP instruction and feedback;Internal/external aids;Compensatory techniques;Language facilitation    Potential to Achieve Goals Good           Patient will benefit from skilled therapeutic intervention in order to improve the following deficits and impairments:   Dysarthria and anarthria  Dysphagia, oral phase    Problem List Patient Active Problem List   Diagnosis Date Noted  . Autism 12/20/2020  . Multiple sclerosis ([place]) 10/11/2020  . MRI of brain abnormal 10/11/2020  . Right sided weakness 10/11/2020  . Gait disturbance 10/11/2020  . Asperger syndrome 03/19/2013    Ora Bollig, Annye Rusk MS, CCC-SLP 01/10/2021, 9:28 AM  [facility] [address] [place], [place], [address] Phone: [phone]   Fax:  [phone]   Name: JOVONTA LEVIT MRN: [id number] Date of Birth: 10/25/1970

## 2021-01-10 NOTE — Patient Instructions (Signed)
   Keep up the good work practicing slow and big talking -   This week try to work on slowing down when upset  If you start having more trouble eating/drinking including:  More difficulty chewing hard foods Coughing, choking, sneezing, tearing while eating and drinking More difficulty swallowing medications  If swallowing get worse, you may consider a modified barium swallow to assess your swallowing muscles and see if you are aspirating any foods or liquids and how to fix that  Signs of Aspiration Pneumonia   . Chest pain/tightness . Fever (can be low grade) . Cough  o With foul-smelling phlegm (sputum) o With sputum containing pus or blood o With greenish sputum . Fatigue  . Shortness of breath  . Wheezing   **IF YOU HAVE THESE SIGNS, CONTACT YOUR DOCTOR OR GO TO THE EMERGENCY DEPARTMENT OR URGENT CARE AS SOON AS POSSIBLE**    Exercising and good oral care helps prevent aspiration pna

## 2021-01-16 DIAGNOSIS — I10 Essential (primary) hypertension: Secondary | ICD-10-CM | POA: Diagnosis not present

## 2021-01-17 ENCOUNTER — Other Ambulatory Visit: Payer: Self-pay

## 2021-01-17 ENCOUNTER — Ambulatory Visit: Payer: PPO | Admitting: Speech Pathology

## 2021-01-17 DIAGNOSIS — F840 Autistic disorder: Secondary | ICD-10-CM | POA: Diagnosis not present

## 2021-01-17 DIAGNOSIS — R471 Dysarthria and anarthria: Secondary | ICD-10-CM | POA: Diagnosis not present

## 2021-01-17 DIAGNOSIS — R1311 Dysphagia, oral phase: Secondary | ICD-10-CM

## 2021-01-17 NOTE — Therapy (Signed)
[facility] [address] [place], [place], [address] Phone: [phone]   Fax:  [phone]  Speech Language Pathology Treatment  Patient Details  Name: George [name] MRN: [id number] Date of Birth: September 05, 1970 Referring Provider (SLP): Lavone Orn MD   Encounter Date: 01/17/2021   End of Session - 01/17/21 1152     Visit Number 10    Number of Visits 17    Date for SLP Re-Evaluation 03/01/21    SLP Start Time 2694    SLP Stop Time  1143    SLP Time Calculation (min) 41 min    Activity Tolerance Patient tolerated treatment well             Past Medical History:  Diagnosis Date   Anxiety    Asperger's syndrome    Autistic spectrum disorder    Asperger's (per [facility])   Bipolar 1 disorder ([place])    "no mood swings in 20 years"  on medication   GERD (gastroesophageal reflux disease)    Head injury 2020   Hypertension    Hypothyroidism    IBS (irritable bowel syndrome)     Past Surgical History:  Procedure Laterality Date   NO PAST SURGERIES     RADIOLOGY WITH ANESTHESIA N/A 05/16/2020   Procedure: MRI BRAIN WITH AND WITHOUT CONTRAST;  Surgeon: Radiologist, Medication, MD;  Location: [facility];  Service: Radiology;  Laterality: N/A;   RADIOLOGY WITH ANESTHESIA N/A 08/29/2020   Procedure: MRI WITH ANESTHESIA CERVICAL SPINE WITH AND WITHOUT CONTRAST, THORACIC SPINE WITH AND WITHOUT CONTRAST;  Surgeon: Radiologist, Medication, MD;  Location: [facility];  Service: Radiology;  Laterality: N/A;    There were no vitals filed for this visit.   Subjective Assessment - 01/17/21 1109     Subjective "My speech is OK, I just go slower. I'm having trouble swallowing"    Currently in Pain? No/denies              Speech Therapy Progress Note  Dates of Reporting Period: 12/01/20 to 01/17/10  Objective Reports of Subjective Statement: Pt ID'd and self correct rapid rate 3x today with mod I  Objective  Measurements: He is following swallow precautions and compensations for dysarthria with supervision cues to mod I  Goal Update: Continue goals  Plan: Continue POC with likely d/c next session if progress cocntintues  Reason Skilled Services are Required: to maximize intelligibility and continue education re: dysphagia management to reduce risk of aspiration pna and maximize ease and PO      ADULT SLP TREATMENT - 01/17/21 1110       General Information   Behavior/Cognition Alert;Cooperative;Impulsive;Distractible;Requires cueing;Agitated      Treatment Provided   Treatment provided Dysphagia      Dysphagia Treatment   Temperature Spikes Noted No    Respiratory Status Room air    Oral Cavity - Dentition Adequate natural dentition    Treatment Methods Skilled observation;Compensation strategy training    Patient observed directly with PO's Yes    Type of PO's observed Thin liquids    Feeding Able to feed self    Liquids provided via Cup    Pharyngeal Phase Signs & Symptoms --   none; s/s of esophageal dysphagie of belching   Type of cueing Verbal    Amount of cueing Modified independent    Other treatment/comments George [name] continues to report difficulty initiating swallow "When I think about it" with liquids only. PO trials of thin liquid  continue to demonstrate no s/s of aspiration. Swallow is timely and laryngeal elevation appears WNL. At this time, I don't recommend MBSS. Educated pt purpose of MBSS and reviewed a video of MBSS. Educated pt re: s/s of aspriation and if swallow worses he an request order for MBSS from MD. He reports difficulty with GERD with foods he should avoid. Reviewed general reflux precautions and foods to avoid. Although speech was not targeted today, George [name] independently ID'd rapid rate 3x and self corrected independently.      Assessment / Recommendations / Plan   Plan Continue with current plan of care      Progression Toward Goals   Progression toward  goals Progressing toward goals              SLP Education - 01/17/21 1145     Education Details reflux precautions, swallow precautions    Person(s) Educated Patient    Methods Explanation;Demonstration    Comprehension Verbalized understanding;Returned demonstration;Verbal cues required              SLP Short Term Goals - 01/17/21 1150       SLP SHORT TERM GOAL #1   Title Pt will complete HEP with min A over 3 sessions    Baseline 12-25-20, 12-27-20    Time 2    Period Weeks    Status Achieved      SLP SHORT TERM GOAL #2   Title Pt will demo use of speech compensation on structured speech tasks with usual min A over 3 sessions    Baseline 12-04-20, 12-18-20, 12-27-20; 01/03/21    Time 2    Period Weeks    Status Achieved      SLP SHORT TERM GOAL #3   Title Pt will demonstrate use of speech compensations in simple 5 minute conversation with usual min A over 3 sessions    Baseline 12-25-20, 12-27-20, 01/03/21    Time 2    Period Weeks    Status Achieved      SLP SHORT TERM GOAL #4   Title Pt will verbalize and demonstrate recommended swallow strategies to reduce s/sx of aspiration with occasional min A over 3 sessions    Baseline 01/03/21;    Time 1    Period Weeks    Status On-going      SLP SHORT TERM GOAL #5   Title Pt will swallow saliva instead of wiping mouth for 4/5 opportunities with min A over 3 sessions    Baseline 12-04-20    Time 2    Period Weeks    Status On-going              SLP Long Term Goals - 01/17/21 1150       SLP LONG TERM GOAL #1   Title Pt will complete HEP with rare min A over 3 sessions    Baseline 01/03/21    Time 4    Period Weeks    Status Achieved      SLP LONG TERM GOAL #2   Title Pt will demonstrate use of speech compensations in simple 15 minute conversation with occasional min A over 2 sessions    Baseline 01/17/21;    Time 4    Period Weeks    Status On-going      SLP LONG TERM GOAL #3   Title Pt will utilize swallow  strategies to decrease s/sx of aspiration with rare min A over 2 sessions    Time 4  Period Weeks    Status Achieved      SLP LONG TERM GOAL #4   Title Pt will report improved communication effectiveness via QOL scale by 3 points by last ST session    Time 4    Period Weeks    Status On-going      SLP LONG TERM GOAL #5   Title Pt will participate in objective swallow study (pending MD order) to evaluate pharyngeal function and assess for risk of aspiration if s/sx of aspiration persist    Baseline MBSS not warrated at this time.    Time 4    Period Weeks    Status Deferred              Plan - 01/17/21 1146     Clinical Impression Statement George [name] continues to improve reducing rate of speech with rare min A to mod I. Today, he ID'd increaed rate 3/3x with mod I and self corrected with Mod I. Ongoing education re: dysphagia and what s/s to be aware of for when MBSS may be warrented. He is following general swallow precautions with rare min A to mod I. Continue skilled ST 1 more visit to maximize carryover on intelligilbity across community settings and complete dysphagia education for safety of swallow in light of [name].    Speech Therapy Frequency 2x / week    Duration 8 weeks   17 visits   Treatment/Interventions Aspiration precaution training;Compensatory strategies;Oral motor exercises;Pharyngeal strengthening exercises;Cueing hierarchy;Functional tasks;Patient/family education;Diet toleration management by SLP;Environmental controls;Multimodal communcation approach;SLP instruction and feedback;Internal/external aids;Compensatory techniques;Language facilitation    Potential to Achieve Goals Good    Potential Considerations Co-morbidities;Previous level of function;Ability to learn/carryover information;Cooperation/participation level             Patient will benefit from skilled therapeutic intervention in order to improve the following deficits and impairments:   Dysphagia,  oral phase    Problem List Patient Active Problem List   Diagnosis Date Noted   Autism 12/20/2020   Multiple sclerosis ([place]) 10/11/2020   MRI of brain abnormal 10/11/2020   Right sided weakness 10/11/2020   Gait disturbance 10/11/2020   Asperger syndrome 03/19/2013    George [name], George [name], George [name] 01/17/2021, 11:53 AM  [facility] [address] [place], [place], [address] Phone: [phone]   Fax:  [phone]   Name: George [name] MRN: [id number] Date of Birth: 1970/08/17

## 2021-01-17 NOTE — Patient Instructions (Addendum)
  Reflux Precautions:   Eat sitting at 90 degrees Remain upright 2 hours after eating Eat slowly and don't over eat Avoid bending over or exercises after eating Chewing gum (non mint) 20 minutes after each meal can help Drinking warm fluids with meals may helpful in clearing the esophagus Anxiety and stress affects digestion  Avoid spice or acidic foots (Tomato foods, citrus, vinegar based foods) Fruit juices such as orange, grapefruit,  or cranberry Fried foods Caffeine Carbonated beverages Chocolate Peppermint Alcohol Decrease Dairy  If your swallow gets worse (which I don't expect to happen) you will need an order form a physician for a "Modified Barium Swallow" like the one we looked at in therapy  Take 1 pill at a time, If you are having trouble with pills, consider taking them in applesauce

## 2021-01-19 DIAGNOSIS — E291 Testicular hypofunction: Secondary | ICD-10-CM | POA: Diagnosis not present

## 2021-01-24 ENCOUNTER — Other Ambulatory Visit: Payer: Self-pay

## 2021-01-24 ENCOUNTER — Encounter: Payer: Self-pay | Admitting: Speech Pathology

## 2021-01-24 ENCOUNTER — Ambulatory Visit: Payer: PPO | Admitting: Speech Pathology

## 2021-01-24 DIAGNOSIS — F840 Autistic disorder: Secondary | ICD-10-CM | POA: Diagnosis not present

## 2021-01-24 DIAGNOSIS — R471 Dysarthria and anarthria: Secondary | ICD-10-CM

## 2021-01-24 DIAGNOSIS — R1311 Dysphagia, oral phase: Secondary | ICD-10-CM

## 2021-01-24 NOTE — Patient Instructions (Addendum)
   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

## 2021-01-24 NOTE — Therapy (Signed)
[facility] [address] [place], [place], [address] Phone: [phone]   Fax:  [phone]  Speech Language Pathology Treatment & Discharge Summary  Patient Details  Name: George [name] MRN: [id number] Date of Birth: 06-22-1971 Referring Provider (SLP): Lavone Orn MD   Encounter Date: 01/24/2021   End of Session - 01/24/21 0947     Visit Number 11    Number of Visits 17    Date for SLP Re-Evaluation 03/01/21    SLP Start Time 0845    SLP Stop Time  0930    SLP Time Calculation (min) 45 min    Activity Tolerance Patient tolerated treatment well             Past Medical History:  Diagnosis Date   Anxiety    Asperger's syndrome    Autistic spectrum disorder    Asperger's (per H&P Olivia Clelland, PA)   Bipolar 1 disorder ([place])    "no mood swings in 20 years"  on medication   GERD (gastroesophageal reflux disease)    Head injury 2020   Hypertension    Hypothyroidism    IBS (irritable bowel syndrome)     Past Surgical History:  Procedure Laterality Date   NO PAST SURGERIES     RADIOLOGY WITH ANESTHESIA N/A 05/16/2020   Procedure: MRI BRAIN WITH AND WITHOUT CONTRAST;  Surgeon: Radiologist, Medication, MD;  Location: [facility];  Service: Radiology;  Laterality: N/A;   RADIOLOGY WITH ANESTHESIA N/A 08/29/2020   Procedure: MRI WITH ANESTHESIA CERVICAL SPINE WITH AND WITHOUT CONTRAST, THORACIC SPINE WITH AND WITHOUT CONTRAST;  Surgeon: Radiologist, Medication, MD;  Location: [facility];  Service: Radiology;  Laterality: N/A;    There were no vitals filed for this visit.   Subjective Assessment - 01/24/21 0852     Subjective "I lost function in my right arm. I was dropping cans yesterday"    Currently in Pain? No/denies                   ADULT SLP TREATMENT - 01/24/21 0852       General Information   Behavior/Cognition Alert;Cooperative;Impulsive;Distractible;Requires cueing;Agitated       Cognitive-Linquistic Treatment   Treatment focused on Dysarthria;Patient/family/caregiver education    Skilled Treatment [name] reports dropping cans at food pantry and tripping getting out of the car and difficulty playing video games. Requested OT/PT evals from PCP. [name] reports success slowing rate of speech in a variety of situations and settings. Today he maintained slow rate and over articulation over 25 minute conversation with mod I. He is having success on Zoom "Meet up" sessions for socialization. Rare min A to swallow before you talk to reduce spitting.      Assessment / Recommendations / Plan   Plan All goals met;Discharge SLP treatment due to (comment)      Dysphagia Recommendations   Diet recommendations Regular;Thin liquid    Liquids provided via Cup    Medication Administration Whole meds with liquid    Supervision Patient able to self feed    Compensations Slow rate;Small sips/bites    Postural Changes and/or Swallow Maneuvers Seated upright 90 degrees;Upright 30-60 min after meal      Progression Toward Goals   Progression toward goals Goals met, education completed, patient discharged from [facility]  Visits from Start of Care: 11  Current functional level related to goals / functional outcomes:  See goals below   Remaining deficits: Mild dysarthria/dysphagia   Education / Equipment: Swallow precautions, HEP for dysarthria, compensations for dysarthira   Patient agrees to discharge. Patient goals were met. Patient is being discharged due to meeting the stated rehab goals.Marland Kitchen       SLP Short Term Goals - 01/24/21 0946       SLP SHORT TERM GOAL #1   Title Pt will complete HEP with min A over 3 sessions    Baseline 12-25-20, 12-27-20    Time 2    Period Weeks    Status Achieved      SLP SHORT TERM GOAL #2   Title Pt will demo use of speech compensation on structured speech tasks with usual min A over 3 sessions     Baseline 12-04-20, 12-18-20, 12-27-20; 01/03/21    Time 2    Period Weeks    Status Achieved      SLP SHORT TERM GOAL #3   Title Pt will demonstrate use of speech compensations in simple 5 minute conversation with usual min A over 3 sessions    Baseline 12-25-20, 12-27-20, 01/03/21    Time 2    Period Weeks    Status Achieved      SLP SHORT TERM GOAL #4   Title Pt will verbalize and demonstrate recommended swallow strategies to reduce s/sx of aspiration with occasional min A over 3 sessions    Baseline 01/03/21;    Time 1    Period Weeks    Status Achieved     SLP SHORT TERM GOAL #5   Title Pt will swallow saliva instead of wiping mouth for 4/5 opportunities with min A over 3 sessions    Baseline 12-04-20    Time 2    Period Weeks    Status Achieved             SLP Long Term Goals - 01/24/21 0947       SLP LONG TERM GOAL #1   Title Pt will complete HEP with rare min A over 3 sessions    Baseline 01/03/21    Time 4    Period Weeks    Status Achieved      SLP LONG TERM GOAL #2   Title Pt will demonstrate use of speech compensations in simple 15 minute conversation with occasional min A over 2 sessions    Baseline 01/17/21; 01/24/21    Time 4    Period Weeks    Status Achieved      SLP LONG TERM GOAL #3   Title Pt will utilize swallow strategies to decrease s/sx of aspiration with rare min A over 2 sessions    Time 4    Period Weeks    Status Achieved      SLP LONG TERM GOAL #4   Title Pt will report improved communication effectiveness via QOL scale by 3 points by last ST session    Time 4    Period Weeks    Status Achieved      SLP LONG TERM GOAL #5   Title Pt will participate in objective swallow study (pending MD order) to evaluate pharyngeal function and assess for risk of aspiration if s/sx of aspiration persist    Baseline MBSS not warrated at this time.    Time 4    Period Weeks    Status Deferred              Plan - 01/24/21 0930  Clinical Impression  Statement [name] has carried over compensations for mild dysarthria and dysphagia. He reports reduced requests for reptition. Objective swallow study is not warranted at this time as there are no overt s/s of aspiration. [name] will continue to monitor any chages in swallowing. Educatoin complete d/c ST - pt is in agreement. He reports increaseing tripping and dropping things -  I requested PT/OT evals.    Speech Therapy Frequency 4x / week    Duration 8 weeks   17 visits   Treatment/Interventions Aspiration precaution training;Compensatory strategies;Oral motor exercises;Pharyngeal strengthening exercises;Cueing hierarchy;Functional tasks;Patient/family education;Diet toleration management by SLP;Environmental controls;Multimodal communcation approach;SLP instruction and feedback;Internal/external aids;Compensatory techniques;Language facilitation    Potential to Achieve Goals Good    Potential Considerations Co-morbidities;Previous level of function;Ability to learn/carryover information;Cooperation/participation level             Patient will benefit from skilled therapeutic intervention in order to improve the following deficits and impairments:   Dysarthria and anarthria  Dysphagia, oral phase    Problem List Patient Active Problem List   Diagnosis Date Noted   Autism 12/20/2020   Multiple sclerosis ([place]) 10/11/2020   MRI of brain abnormal 10/11/2020   Right sided weakness 10/11/2020   Gait disturbance 10/11/2020   Asperger syndrome 03/19/2013    [name] Dunn, Annye Rusk MS, CCC-SLP 01/24/2021, 9:48 AM  [facility] [address] [place], [place], [address] Phone: [phone]   Fax:  [phone]   Name: JYAIRE KOUDELKA MRN: [id number] Date of Birth: Apr 18, 1971

## 2021-01-31 DIAGNOSIS — F840 Autistic disorder: Secondary | ICD-10-CM | POA: Diagnosis not present

## 2021-02-01 DIAGNOSIS — F3176 Bipolar disorder, in full remission, most recent episode depressed: Secondary | ICD-10-CM | POA: Diagnosis not present

## 2021-02-01 DIAGNOSIS — F3174 Bipolar disorder, in full remission, most recent episode manic: Secondary | ICD-10-CM | POA: Diagnosis not present

## 2021-02-07 DIAGNOSIS — F840 Autistic disorder: Secondary | ICD-10-CM | POA: Diagnosis not present

## 2021-02-09 DIAGNOSIS — E291 Testicular hypofunction: Secondary | ICD-10-CM | POA: Diagnosis not present

## 2021-02-09 DIAGNOSIS — Z23 Encounter for immunization: Secondary | ICD-10-CM | POA: Diagnosis not present

## 2021-02-12 ENCOUNTER — Telehealth: Payer: Self-pay | Admitting: Neurology

## 2021-02-12 NOTE — Telephone Encounter (Signed)
Pt wanting to inform the doctor that his MS is getting worse.

## 2021-02-12 NOTE — Telephone Encounter (Signed)
Called the patient to get more information and he would like to come in for an apt. Dr Felecia Shelling has opening tomorrow 3:30 pm. I have offered that to the pt and he accepted. Pt understands he should check in between 3 and 3:15 pm.

## 2021-02-13 ENCOUNTER — Encounter: Payer: Self-pay | Admitting: Neurology

## 2021-02-13 ENCOUNTER — Other Ambulatory Visit: Payer: Self-pay

## 2021-02-13 ENCOUNTER — Ambulatory Visit: Payer: PPO | Admitting: Neurology

## 2021-02-13 VITALS — BP 133/90 | HR 94 | Ht 67.75 in | Wt 157.5 lb

## 2021-02-13 DIAGNOSIS — G35 Multiple sclerosis: Secondary | ICD-10-CM

## 2021-02-13 DIAGNOSIS — R269 Unspecified abnormalities of gait and mobility: Secondary | ICD-10-CM

## 2021-02-13 DIAGNOSIS — R531 Weakness: Secondary | ICD-10-CM | POA: Diagnosis not present

## 2021-02-13 DIAGNOSIS — F840 Autistic disorder: Secondary | ICD-10-CM | POA: Diagnosis not present

## 2021-02-13 DIAGNOSIS — R4781 Slurred speech: Secondary | ICD-10-CM | POA: Diagnosis not present

## 2021-02-13 NOTE — Progress Notes (Signed)
[facility]  PATIENT: George [name] DOB: 1971-03-13  REFERRING DOCTOR OR PCP: PCP is Lavone Orn.  Patient has seen Dr. Leta Baptist SOURCE: Patient, mother, notes from Dr. Leta Baptist, MRI imaging reports and lab reports.  MRI images personally reviewed  _________________________________   HISTORICAL  CHIEF COMPLAINT:  Chief Complaint  Patient presents with   Follow-up    RM 10, alone. Last seen 12/20/2020. On glatiramer for MS. Feels MS is getting worse. Slurring speech more, harder to walk. 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 Speech was slurred. Hard to stand. Unsure when sx started getting worse, states it is progressive. Stopped using caffeine a couple weeks ago. Was barefoot and stepped on small piece of cement that was sharp with left foot. Limping more d/t this.    HISTORY OF PRESENT ILLNESS:  Mr. Kier is a 50 y.o. man withrelapsing remitting multiple sclerosis.  Update  02/13/2021 He started glatiramer acetate for the MS earlier this year and he tolerates it well.  He notes some fluctuations in his symptoms.   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  He also tripped (caught self) but has had a mild limp.    MRIs are c/w MS.  He had an LP 09/13/2020 but there were no OCB.       He was not feeling good a few weeks ago but was found to have Covid-19.  At the time he had reduced appetite and didn't feel right.     He is following a strict diet (FODMAP diet) for irritable bowel.  On the diet he has much less abdominal pain.  He also finds it helps his mood.     Currently, he notes gait is mildly off.  Balance is off and he has stumbles and occasional falls.   His right foot sometimes drags and he trips over it at times, especially in the bathtub/shower.   He is getting grab bar.    He denies diplopia.   He notes he slightly slurs words and feels the right face is mildly weak.   He is working with a Transport planner.   He notes vision is fine.     Bladder function is fine though worse with caffeine.     He notes some fatigue but does better with B12 supplements.   Vit D was low and he is advised to take 2000 U daily   MS HISTORY: He has a h/o Asperger syndrome and bipolar disease.   In 2012, he had an MRI of the brain and was found to have multiple T2/FLAIR hyperintense foci in the brain.   A lumbar puncture at the time was negative.    Over the last year, he began to have more difficulty with right hand coordination and left foot drop.    Also the right side of his face was drooping some.     He came in to see Dr. Leta Baptist and had repeat imaging studies showing progression of the white matter foci and the appearance of a small focus at C5.    Repeat LP, however, showed negative CSF (no OCB) again.   He started glatiramer 10/2020.    OTHER MEDICAL HISTORY He has bipolar disease (sees Dr. Toy Care).  He also has autism.   He is on Depakote.   He notes being more emotional and easily gets overwhelmed with emotions.   This had happened when he was younger but then got better again.      He has  irritable bowel syndrome and takes B12 since he was told absorption may be affected.   B12 level was fine.     Imaging studies reviewed: MRI of the brain 11/21/2010 shows multiple T2/FLAIR hyperintense foci including a focus in the left pons, middle cerebellar peduncle, right thalamus.  Some foci of periventricular and some foci are juxtacortical.  None of the foci enhanced.  MRI of the brain 05/16/2020 showed a similar pattern at the 2012 MRI but there has been mild progression with at least 2 new lesions noted in the white matter compared to the previous MRI.  As seen before there is a large focus involving the left greater than right pons.  It may be slightly larger.  There is also involvement of the middle cerebellar peduncle which is stable on the right but a new focus on the left.  There is one stable focus in the right thalamus.  MRI of the cervical  spine 08/29/2020 showed a small focus posteriorly to the right adjacent to C5.  Mild DJD at C5-C6 not causing nerve root compression.  MRI of the thoracic spine 08/29/2020 showed a normal spinal cord (subtle focus at T10-T11 more likely artifact).  There are cystic lesions within the kidneys  Laboratory results. ANA, ANCA, HIV, hep B antibodies, hep C antibody were all negative or normal.  Vitamin D was mildly low.  Vitamin B12 was fine (elevated), creatinine was mildly elevated.  JCV antibody was positive at 1.3.  There were no oligoclonal bands in the CSF.  Protein was mildly elevated at 54.  REVIEW OF SYSTEMS: Constitutional: No fevers, chills, sweats, or change in appetite Eyes: No visual changes, double vision, eye pain Ear, nose and throat: No hearing loss, ear pain, nasal congestion, sore throat Cardiovascular: No chest pain, palpitations Respiratory:  No shortness of breath at rest or with exertion.   No wheezes GastrointestinaI: No nausea, vomiting, diarrhea, abdominal pain, fecal incontinence Genitourinary:  No dysuria, urinary retention or frequency.  No nocturia. Musculoskeletal:  No neck pain, back pain Integumentary: No rash, pruritus, skin lesions Neurological: as above Psychiatric: No depression at this time.  No anxiety Endocrine: No palpitations, diaphoresis, change in appetite, change in weigh or increased thirst Hematologic/Lymphatic:  No anemia, purpura, petechiae. Allergic/Immunologic: No itchy/runny eyes, nasal congestion, recent allergic reactions, rashes  ALLERGIES: Allergies  Allergen Reactions   Other Other (See Comments)    Can only eat at home    HOME MEDICATIONS:  Current Outpatient Medications:    amLODipine-valsartan (EXFORGE) 5-160 MG tablet, Take 1 tablet by mouth daily., Disp: , Rfl:    busPIRone (BUSPAR) 10 MG tablet, Take 20 mg by mouth 2 (two) times daily., Disp: , Rfl:    butalbital-acetaminophen-caffeine (FIORICET) 50-325-40 MG tablet, Take 1  tablet by mouth every 6 (six) hours as needed for headache., Disp: 10 tablet, Rfl: 0   cholecalciferol (VITAMIN D) 1000 units tablet, Take 1,000 Units by mouth 2 (two) times a week., Disp: , Rfl:    CHOLINE PO, Take 300 mg by mouth., Disp: , Rfl:    Cyanocobalamin (VITAMIN B-12) 5000 MCG TBDP, Place 5,000 Units under the tongue daily., Disp: , Rfl:    divalproex (DEPAKOTE) 250 MG DR tablet, Take 1,250 mg by mouth daily. , Disp: , Rfl:    Glatiramer Acetate (COPAXONE) 40 MG/ML SOSY, Inject 40 mg into the skin 3 (three) times a week., Disp: 11.76 mL, Rfl: 12   SYNTHROID 100 MCG tablet, Take 100 mcg by mouth daily before breakfast. ,  Disp: , Rfl:    testosterone enanthate (DELATESTRYL) 200 MG/ML injection, Inject 50-200 mg into the muscle See admin instructions. For IM use only  Every three weeks, Disp: , Rfl:   PAST MEDICAL HISTORY: Past Medical History:  Diagnosis Date   Anxiety    Asperger's syndrome    Autistic spectrum disorder    Asperger's (per H&P Olivia Clelland, PA)   Bipolar 1 disorder ([place])    "no mood swings in 20 years"  on medication   GERD (gastroesophageal reflux disease)    Head injury 2020   Hypertension    Hypothyroidism    IBS (irritable bowel syndrome)     PAST SURGICAL HISTORY: Past Surgical History:  Procedure Laterality Date   NO PAST SURGERIES     RADIOLOGY WITH ANESTHESIA N/A 05/16/2020   Procedure: MRI BRAIN WITH AND WITHOUT CONTRAST;  Surgeon: Radiologist, Medication, MD;  Location: [facility];  Service: Radiology;  Laterality: N/A;   RADIOLOGY WITH ANESTHESIA N/A 08/29/2020   Procedure: MRI WITH ANESTHESIA CERVICAL SPINE WITH AND WITHOUT CONTRAST, THORACIC SPINE WITH AND WITHOUT CONTRAST;  Surgeon: Radiologist, Medication, MD;  Location: [facility];  Service: Radiology;  Laterality: N/A;    FAMILY HISTORY: Family History  Problem Relation Age of Onset   Hypertension Father    Depression Sister    Hypertension Paternal Grandfather     SOCIAL  HISTORY:  Social History   Socioeconomic History   Marital status: Single    Spouse name: Not on file   Number of children: 0   Years of education: college   Highest education level: Not on file  Occupational History    Comment: artist  Tobacco Use   Smoking status: Never   Smokeless tobacco: Never  Vaping Use   Vaping Use: Never used  Substance and Sexual Activity   Alcohol use: No   Drug use: No   Sexual activity: Not on file  Other Topics Concern   Not on file  Social History Narrative   He lives at home.    He is single and has no children.     He denies use of tobacco, alcohol, illicit drugs and caffeine.    He works as an Training and development officer.    Oncologist: Not on [employer] Insecurity: Not on file  Transportation Needs: Not on file  Physical Activity: Not on file  Stress: Not on file  Social Connections: Not on file  Intimate Partner Violence: Not on file     PHYSICAL EXAM  Vitals:   02/13/21 1506  BP: 133/90  Pulse: 94  Weight: 157 lb 8 oz (71.4 kg)  Height: 5' 7.75" (1.721 m)    Body mass index is 24.12 kg/m.   General: The patient is well-developed and well-nourished and in no acute distress  HEENT:  Head is [place]/AT.  Sclera are anicteric.   Neck:  The neck is nontender.  Skin: Extremities are without rash or  edema.   Neurologic Exam  Mental status: The patient is alert and oriented x 3 at the time of the examination. The patient has apparent normal recent and remote memory, with an apparently normal attention span and concentration ability.   Speech is normal.  Cranial nerves: Extraocular movements are full.   He has mild weakness in the right lower face (normal strength in forehead).   There is good facial sensation to soft touch bilaterally. Facial strength is normal.  Trapezius and sternocleidomastoid strength is  normal. No dysarthria is noted.    No obvious hearing deficits are noted.  Motor:  Muscle  bulk is normal.   Tone is normal. Strength is  5 / 5 in left side, 5-/5 right arm (mildly reduced RAM)  4+/5 right  leg  Sensory: Sensory testing is intact to pinprick, soft touch and vibration sensation in all 4 extremities.  Coordination: Cerebellar testing reveals mildly reduced coordination right hand and foot.   Reduced heel-to-shin.  Gait and station: Station is normal.   Mild right foot drop.  Wide tandem gait.    Romberg is negative.   Reflexes: Deep tendon reflexes are symmetric and normal in arms and increased right leg.        DIAGNOSTIC DATA (LABS, IMAGING, TESTING) - I reviewed patient records, labs, notes, testing and imaging myself where available.  Lab Results  Component Value Date   WBC 7.1 09/11/2020   HGB 17.4 09/11/2020   HCT 52.4 (H) 09/11/2020   MCV 85 09/11/2020   PLT 206 09/11/2020      Component Value Date/Time   NA 142 09/11/2020 1035   K 4.6 09/11/2020 1035   CL 101 09/11/2020 1035   CO2 21 09/11/2020 1035   GLUCOSE 82 09/11/2020 1035   GLUCOSE 88 08/29/2020 1019   BUN 16 09/11/2020 1035   CREATININE 1.33 (H) 09/11/2020 1035   CREATININE 1.14 03/04/2014 1310   CALCIUM 9.9 09/11/2020 1035   PROT 7.3 09/11/2020 1035   ALBUMIN 4.6 09/11/2020 1035   AST 16 09/11/2020 1035   ALT 20 09/11/2020 1035   ALKPHOS 67 09/11/2020 1035   BILITOT 0.3 09/11/2020 1035   GFRNONAA 62 09/11/2020 1035   GFRNONAA >60 08/29/2020 1019   GFRAA 72 09/11/2020 1035   No results found for: CHOL, HDL, LDLCALC, LDLDIRECT, TRIG, CHOLHDL Lab Results  Component Value Date   HGBA1C 5.4 09/11/2020   Lab Results  Component Value Date   VITAMINB12 >2000 (H) 09/11/2020   Lab Results  Component Value Date   TSH 1.400 09/11/2020       ASSESSMENT AND PLAN  Multiple sclerosis (HCC)  Gait disturbance  Right sided weakness  Autism  Weakness   1.   Continue glatiramer.   Due to new symptoms,  We will check MRi and consider changing the DMT if significant  progression.    2.   Stay active and exercie as tolerated. 3.   return to see [place] in 4-5 months or sooner for new or worsening neurologic symptoms.   Zarion Oliff A. Felecia Shelling, MD, [facility] 11/05/4740, 5:95 PM Certified in Neurology, Clinical Neurophysiology, Sleep Medicine and Neuroimaging  [facility] [address], [place], [place] [address] [phone]

## 2021-02-14 ENCOUNTER — Ambulatory Visit: Payer: PPO | Admitting: Occupational Therapy

## 2021-02-14 ENCOUNTER — Telehealth: Payer: Self-pay | Admitting: Neurology

## 2021-02-14 ENCOUNTER — Other Ambulatory Visit: Payer: Self-pay

## 2021-02-14 ENCOUNTER — Ambulatory Visit: Payer: PPO | Attending: Internal Medicine | Admitting: Physical Therapy

## 2021-02-14 VITALS — BP 137/93

## 2021-02-14 DIAGNOSIS — R2689 Other abnormalities of gait and mobility: Secondary | ICD-10-CM

## 2021-02-14 DIAGNOSIS — G35 Multiple sclerosis: Secondary | ICD-10-CM | POA: Diagnosis not present

## 2021-02-14 DIAGNOSIS — R2681 Unsteadiness on feet: Secondary | ICD-10-CM | POA: Insufficient documentation

## 2021-02-14 DIAGNOSIS — F840 Autistic disorder: Secondary | ICD-10-CM | POA: Diagnosis not present

## 2021-02-14 DIAGNOSIS — R41844 Frontal lobe and executive function deficit: Secondary | ICD-10-CM | POA: Diagnosis not present

## 2021-02-14 DIAGNOSIS — R278 Other lack of coordination: Secondary | ICD-10-CM | POA: Diagnosis not present

## 2021-02-14 DIAGNOSIS — M6281 Muscle weakness (generalized): Secondary | ICD-10-CM | POA: Diagnosis not present

## 2021-02-14 NOTE — Therapy (Signed)
[facility] [address] [place], [place], [address] Phone: [phone]   Fax:  [phone]  Physical Therapy Evaluation  Patient Details  Name: George [name] MRN: [id number] Date of Birth: 1970-08-07 Referring Provider (PT): Lavone Orn, MD   Encounter Date: 02/14/2021   PT End of Session - 02/14/21 0823     Visit Number 1    Number of Visits 6    Date for PT Re-Evaluation 03/28/21    Authorization Type [employer]    PT Start Time 0845    PT Stop Time 0930    PT Time Calculation (min) 45 min    Activity Tolerance Patient tolerated treatment well    Behavior During Therapy [facility] for tasks assessed/performed             Past Medical History:  Diagnosis Date   Anxiety    Asperger's syndrome    Autistic spectrum disorder    Asperger's (per H&P Olivia Clelland, PA)   Bipolar 1 disorder ([place])    "no mood swings in 20 years"  on medication   GERD (gastroesophageal reflux disease)    Head injury 2020   Hypertension    Hypothyroidism    IBS (irritable bowel syndrome)     Past Surgical History:  Procedure Laterality Date   NO PAST SURGERIES     RADIOLOGY WITH ANESTHESIA N/A 05/16/2020   Procedure: MRI BRAIN WITH AND WITHOUT CONTRAST;  Surgeon: Radiologist, Medication, MD;  Location: [facility];  Service: Radiology;  Laterality: N/A;   RADIOLOGY WITH ANESTHESIA N/A 08/29/2020   Procedure: MRI WITH ANESTHESIA CERVICAL SPINE WITH AND WITHOUT CONTRAST, THORACIC SPINE WITH AND WITHOUT CONTRAST;  Surgeon: Radiologist, Medication, MD;  Location: [place];  Service: Radiology;  Laterality: N/A;    Vitals:   02/14/21 0918  BP: (!) 137/93      Subjective Assessment - 02/14/21 0844     Subjective Pt reports diagnosed with MS around 35; didn't get worse until this year. Suddenly most of his issues are on his right side. Reports his leg is fine but it's weaker and he has a tendency to drag it a little bit; pt  states he can trip over it sometimes. Pt states he loses his balance 1-2x/day. 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    Pertinent History h/o Asperger syndrome and bipolar disease    Limitations Standing;Walking;House hold activities    How long can you stand comfortably? ~1 hr he needs to sit down    How long can you walk comfortably? ~30 minutes of walking; limited due to fatigue    Diagnostic tests MRI: A tiny T2 hyperintense lesion is questioned within the right  dorsolateral spinal cord at the C5 level (versus artifact). No other  cervical spinal cord signal abnormality is identified. No abnormal  cord enhancement.  Mild cervical spondylosis as described. No significant spinal canal  or foraminal stenosis.    Patient Stated Goals Improve his balance and walking    Currently in Pain? No/denies                [facility] PT Assessment - 02/14/21 0848       Assessment   Medical Diagnosis MS    Referring Provider (PT) Lavone Orn, MD    Hand Dominance Left    Prior Therapy none      Precautions   Precautions Fall      Restrictions   Weight Bearing Restrictions No  Balance Screen   Has the patient fallen in the past 6 months Yes    How many times? 1   fell out of car   Has the patient had a decrease in activity level because of a fear of falling?  Yes    Is the patient reluctant to leave their home because of a fear of falling?  Yes      [place] residence    Living Arrangements Alone    Available Help at Discharge Family    Type of [place] to enter    Entrance Stairs-Number of Steps 3   in the front; 5 in the back   Entrance Stairs-Rails Can reach both    [place] One level    [place] bars - tub/shower      Prior Function   Level of [place] On disability    Vocation Requirements trying to get part time job through voc rehab      Cognition    Overall Cognitive Status History of cognitive impairments - at baseline      Observation/Other Assessments   Focus on Therapeutic Outcomes (FOTO)  n/a      Sensation   Light Touch Appears Intact      ROM / Strength   AROM / PROM / Strength AROM;Strength      Strength   Strength Assessment Site Hip;Knee;Ankle    Right/Left Hip Right;Left    Right Hip Flexion 4-/5    Right Hip Extension 4+/5    Right Hip ABduction 4+/5    Left Hip Flexion 4+/5    Left Hip Extension 4+/5    Left Hip ABduction 4+/5    Right/Left Knee Right;Left    Right Knee Flexion 3+/5    Right Knee Extension 5/5    Left Knee Flexion 5/5    Left Knee Extension 5/5    Right/Left Ankle Right;Left    Right Ankle Dorsiflexion 4-/5    Right Ankle Plantar Flexion 3+/5    Left Ankle Dorsiflexion 5/5    Left Ankle Plantar Flexion 5/5      Ambulation/Gait   Ambulation Distance (Feet) 445 Feet    Assistive device None    Gait Pattern Step-through pattern;Decreased dorsiflexion - right;Decreased arm swing - right;Right genu recurvatum;Abducted- right;Poor foot clearance - right    Ambulation Surface Level;Indoor      Standardized Balance Assessment   Standardized Balance Assessment Five Times Sit to Stand    Five times sit to stand comments  7 sec   1 posterior LOB with legs against bed     Functional Gait  Assessment   Gait assessed  Yes    Gait Level Surface Walks 20 ft in less than 5.5 sec, no assistive devices, good speed, no evidence for imbalance, normal gait pattern, deviates no more than 6 in outside of the 12 in walkway width.    Change in Gait Speed Able to change speed, demonstrates mild gait deviations, deviates 6-10 in outside of the 12 in walkway width, or no gait deviations, unable to achieve a major change in velocity, or uses a change in velocity, or uses an assistive device.    Gait with Horizontal Head Turns Performs head turns smoothly with slight change in gait velocity (eg, minor disruption to  smooth gait path), deviates 6-10 in outside 12 in walkway width, or uses an assistive device.  Gait with Vertical Head Turns Performs task with slight change in gait velocity (eg, minor disruption to smooth gait path), deviates 6 - 10 in outside 12 in walkway width or uses assistive device    Gait and Pivot Turn Pivot turns safely in greater than 3 sec and stops with no loss of balance, or pivot turns safely within 3 sec and stops with mild imbalance, requires small steps to catch balance.    Step Over Obstacle Is able to step over 2 stacked shoe boxes taped together (9 in total height) without changing gait speed. No evidence of imbalance.    Gait with Narrow Base of Support Ambulates 4-7 steps.    Gait with Eyes Closed Walks 20 ft, slow speed, abnormal gait pattern, evidence for imbalance, deviates 10-15 in outside 12 in walkway width. Requires more than 9 sec to ambulate 20 ft.    Ambulating Backwards Walks 20 ft, uses assistive device, slower speed, mild gait deviations, deviates 6-10 in outside 12 in walkway width.    Steps Alternating feet, must use rail.    Total Score 20    FGA comment: 20/30                        Objective measurements completed on examination: See above findings.               PT Education - 02/14/21 1256     Education Details Discussed exam findings, POC and initial HEP. Demonstrated use of medbridge app for his exercises    Person(s) Educated Patient    Methods Explanation;Handout;Verbal cues;Demonstration;Tactile cues    Comprehension Verbalized understanding;Returned demonstration;Verbal cues required;Tactile cues required;Need further instruction                 PT Long Term Goals - 02/14/21 1257       PT LONG TERM GOAL #1   Title Pt will be independent with progressing his strengthening and balance HEP    Time 6    Period Weeks    Status New    Target Date 03/28/21      PT LONG TERM GOAL #2   Title Pt will have  improved FGA score to at least 24/30 to demo low fall risk    Time 6    Period Weeks    Status New    Target Date 03/28/21      PT LONG TERM GOAL #3   Title Pt will be able to perform 5x STS safely with no LOBs    Time 6    Period Weeks    Status New    Target Date 03/28/21      PT LONG TERM GOAL #4   Title Pt will be able to tolerate ambulating >1000' with no LOBs outdoors and indoors for safe community mobility    Time 6    Period Weeks    Status New    Target Date 03/28/21                    Plan - 02/14/21 0930     Clinical Impression Statement Mr. George [name] is a 50 y/o M presenting to OPPT for complaint of increasing LOBs. PMH significant for Asperger syndrome and bipolar disease. Pt is active at baseline and goes to the gym and does machine strengthening. On assessment, pt demos overall good hip strength but decreased knee and ankle MMT. Pt found to have gait abnormalities with decreased balance  based on FGA and had 1 LOB while performing 5x STS. Pt would benefit from PT to improve his safety with home and community mobility to maximize his overall function. Pt did have incidence of feeling nauseous near end of session -- BP WFL and improved after hydration.    Personal Factors and Comorbidities Comorbidity 1;Comorbidity 2;Age;Fitness;Time since onset of injury/illness/exacerbation    Comorbidities MS, Asperger's    Examination-Activity Limitations Locomotion Level;Squat;Carry;Stand    Examination-Participation Restrictions Community Activity;Yard Work;Volunteer;Shop    Stability/Clinical Decision Making Stable/Uncomplicated    Clinical Decision Making Low    Rehab Potential Good    PT Frequency 1x / week    PT Duration 6 weeks    PT Treatment/Interventions ADLs/Self Care Home Management;Aquatic Therapy;DME Instruction;Gait training;Stair training;Functional mobility training;Therapeutic activities;Therapeutic exercise;Balance training;Neuromuscular  re-education;Patient/family education;Orthotic Fit/Training;Manual techniques;Passive range of motion;Energy conservation;Taping    PT Next Visit Plan Work on strengthening R quad, hamstring, and ankle. Work on balance on compliant surface and narrow BOS. Consider mCTSIB or SOT.    PT Home Exercise Plan Access Code [email or web address]    Consulted and Agree with Plan of Care Patient             Patient will benefit from skilled therapeutic intervention in order to improve the following deficits and impairments:  Abnormal gait, Difficulty walking, Decreased balance, Decreased endurance, Decreased activity tolerance, Decreased mobility, Decreased strength, Impaired UE functional use  Visit Diagnosis: Muscle weakness (generalized)  Unsteadiness on feet  Other abnormalities of gait and mobility  MS (multiple sclerosis) ([place])     Problem List Patient Active Problem List   Diagnosis Date Noted   Autism 12/20/2020   Multiple sclerosis ([place]) 10/11/2020   MRI of brain abnormal 10/11/2020   Right sided weakness 10/11/2020   Gait disturbance 10/11/2020   Asperger syndrome 03/19/2013    [facility] April Ma L Everrett Lacasse PT, DPT 02/14/2021, 1:03 PM  [facility] [address] [place], [place], [address] Phone: [phone]   Fax:  [phone]  Name: George [name] MRN: [id number] Date of Birth: March 27, 1971

## 2021-02-14 NOTE — Therapy (Signed)
[facility] [address] [place], [place], [address] Phone: [phone]   Fax:  [phone]  Occupational Therapy Evaluation  Patient Details  Name: George [name] MRN: [id number] Date of Birth: 15-Aug-1970 Referring Provider (OT): Dr. Lavone Orn   Encounter Date: 02/14/2021   OT End of Session - 02/14/21 0927     Visit Number 1    Number of Visits 17    Date for OT Re-Evaluation 04/17/21    Authorization Type [employer]    OT Start Time 0800    OT Stop Time 0845    OT Time Calculation (min) 45 min    Activity Tolerance Patient tolerated treatment well    Behavior During Therapy [facility] for tasks assessed/performed             Past Medical History:  Diagnosis Date   Anxiety    Asperger's syndrome    Autistic spectrum disorder    Asperger's (per H&P Olivia Clelland, PA)   Bipolar 1 disorder ([place])    "no mood swings in 20 years"  on medication   GERD (gastroesophageal reflux disease)    Head injury 2020   Hypertension    Hypothyroidism    IBS (irritable bowel syndrome)     Past Surgical History:  Procedure Laterality Date   NO PAST SURGERIES     RADIOLOGY WITH ANESTHESIA N/A 05/16/2020   Procedure: MRI BRAIN WITH AND WITHOUT CONTRAST;  Surgeon: Radiologist, Medication, MD;  Location: [place];  Service: Radiology;  Laterality: N/A;   RADIOLOGY WITH ANESTHESIA N/A 08/29/2020   Procedure: MRI WITH ANESTHESIA CERVICAL SPINE WITH AND WITHOUT CONTRAST, THORACIC SPINE WITH AND WITHOUT CONTRAST;  Surgeon: Radiologist, Medication, MD;  Location: [facility];  Service: Radiology;  Laterality: N/A;    There were no vitals filed for this visit.   Subjective Assessment - 02/14/21 0801     Subjective  I'm getting another MRI in the next month or so. I saw Dr. Felecia Shelling yesterday    Pertinent History MS suspected in his mid 25's, worsened in mid 2021. Pt also with Asperger syndrome and biplolar disease. In 2012, he  had MRI of brain and was found to have multiple T2/FLAIR hyerintensive foci. Over last year, pt began having more difficulty with Rt hand coordination and Rt foot drop. Had fall approx 10 mos ago, hitting head, no LOC.    Currently in Pain? No/denies               [facility] OT Assessment - 02/14/21 0001       Assessment   Medical Diagnosis MS    Referring Provider (OT) Dr. Lavone Orn    Onset Date/Surgical Date --   diagnosed in mid 30's, but worsened symptoms beginning 2021   Hand Dominance Left    Prior Therapy none      Precautions   Precautions Fall   pt almost fell coming into gym   Precaution Comments ? safety w/ driving      Restrictions   Weight Bearing Restrictions No      Balance Screen   Has the patient fallen in the past 6 months Yes    How many times? 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      ADL   Eating/Feeding Modified independent   difficulty cutting food w/ only Lt hand - needs rocker knife   Grooming Modified independent   with Lt hand   Upper Body Bathing Modified independent    Lower Body Bathing Modified independent    Upper Body Dressing Increased time   unable to do buttons, zippers difficult, has A/E for tying shoes   Lower Body Dressing Increased time   see above   Toilet Transfer Modified independent    Toileting - Clothing Manipulation Modified independent    Toileting -  Hygiene Modified Independent    Tub/Shower Transfer Modified independent   question safety   ADL comments Pt w/ difficulty cutting food, buttons/fastners, drops things from Rt hand. Pt may benefit from rocker knife, button hook, one handed cutting board       IADL   Shopping Shops independently for small purchases    Light Housekeeping Performs light daily tasks such as dishwashing, bed making;Does personal laundry completely    Meal Prep Plans, prepares and serves adequate meals independently   ? Electrical engineer Drives own vehicle   ? safety with this and recommends driving eval, however pt became upset when discussing concerns. Pt would benefit from adaptations to gas/brake to control with Lt foot (vs. Rt)   Medication Management Is responsible for taking medication in correct dosages at correct time    Financial Management Manages financial matters independently (budgets, writes checks, pays rent, bills goes to bank), collects and keeps track of income   Dad assists w/ finances     Mobility   Mobility Status Comments Pt walks without device but at high fall risk      Written Expression   Dominant Hand Left    Handwriting --   denies change, always bad per pt report     Vision - History   Baseline Vision Wears glasses only for reading    Visual History Macular degeneration   minimal   Additional Comments denies change      Cognition   Overall Cognitive Status Impaired/Different from baseline   however also d/t premorbid Aspergers and bipolar   Cognition Comments very tangential in communication, talkative, perseverative, decreased awareness of deficits, and decreased social awareness/communication (from [employer])      Observation/Other Assessments   Observations Pt fall rish and almost fell walking back to gym, pt reports dropping things from Rt hand often      Sensation   Light Touch Appears Intact   in UE's     Coordination   9 Hole Peg Test Right;Left    Right 9 Hole Peg Test 70.07 sec    Left 9 Hole Peg Test 24.00 sec    Coordination Unable to type Rt hand, using only Lt hand      Edema   Edema none      Tone   Assessment Location Right Upper Extremity      ROM / Strength   AROM / PROM / Strength  AROM;Strength      AROM   Overall AROM Comments BUE AROM WFL's except Rt hand: decreased finger extension and isolated movements due to tone/dystonia. RUE weakness and decreased control      Hand Function   Right Hand Grip (lbs) 58.6 lbs  Left Hand Grip (lbs) 106.9 lbs      RUE Tone   RUE Tone Hypertonic;Mild   dystonia Rt hand                            OT Education - 02/14/21 0943     Education Details Recommended driving evaluation through voc rehab and adaptations to gas/brake pedals to be controlled by Lt foot vs. Rt foot  (pt became upset and perseverating on how he is safe and not wanting license taken away)    Person(s) Educated Patient    Methods Explanation    Comprehension Other (comment)   decreased insight into deficits and refused recommendations             OT Short Term Goals - 02/14/21 0945       OT SHORT TERM GOAL #1   Title Independent with coordination HEP for Rt hand    Time 4    Period Weeks    Status New      OT SHORT TERM GOAL #2   Title Pt to verbalize understanding with potential A/E and DME needs greater safety, ease, and independence with ADLS (rocker knife, one handed cutting board, button hook, shower seat, etc)    Time 4    Period Weeks    Status New      OT SHORT TERM GOAL #3   Title Pt to verbalize understanding with energy conservation techniques and safety considerations with ADLS and driving including potential need for car adaptations and driving eval    Time 4    Period Weeks    Status New      OT SHORT TERM GOAL #4   Title Pt to verbalize understanding with potential assistive technology for typing and give contact info for local A.T. program    Time 4    Period Weeks    Status New               OT Long Term Goals - 02/14/21 0950       OT LONG TERM GOAL #1   Title Pt to improve coordination Rt hand as evidenced by performing 9 hole peg test in under 60 sec    Baseline 70.07 sec    Time 8     Period Weeks    Status New      OT LONG TERM GOAL #2   Title Pt to report less overall drops Rt hand with bilateral tasks    Time 8    Period Weeks    Status New      OT LONG TERM GOAL #3   Title Pt to demo safety with cooking tasks using A/E and compensations prn    Time 8    Period Weeks    Status New      OT LONG TERM GOAL #4   Title Improve grip strength Rt hand by 10 lbs to assist in opening tight jars/containers    Baseline 58.6 lbs (Lt = 106.9)    Time 8    Period Weeks    Status New                   Plan - 02/14/21 0934     Clinical Impression Statement Pt is a 50 y.o. male who presents to [facility] diagnosed with MS. MS suspected in his mid 28's, worsened in mid 2021. Pt also with Asperger syndrome and biplolar disease. In 2012,  he had MRI of brain and was found to have multiple T2/FLAIR hyerintensive foci. Over last year, pt began having more difficulty with Rt hand coordination and Rt foot drop. Had fall approx 10 mos ago, hitting head, no LOC. Pt presents today with decreased balance, coordination RUE, decreased strength Rt side, and decreased awareness into deficits. Pt perseverative, tangenital, impulsive, and at times unsafe. Pt will benefit from O.T. to address deficits, increase safety, and educate in A/E and compensatory strategies prn    OT Occupational Profile and History Detailed Assessment- Review of Records and additional review of physical, cognitive, psychosocial history related to current functional performance    Occupational performance deficits (Please refer to evaluation for details): ADL's;IADL's;Work;Leisure;Social Participation    Body Structure / Function / Physical Skills ADL;Strength;Decreased knowledge of use of DME;Tone;Dexterity;Balance;Body mechanics;Proprioception;UE functional use;IADL;ROM;Coordination;Mobility;FMC;Decreased knowledge of precautions    Cognitive Skills Attention;Safety Awareness;Problem Solve    Psychosocial Skills  Interpersonal Interaction    Rehab Potential Fair   decreased awareness   Comorbidities Affecting Occupational Performance: Presence of comorbidities impacting occupational performance    Comorbidities impacting occupational performance description: Aspergers, bipolar    Modification or Assistance to Complete Evaluation  Min-Moderate modification of tasks or assist with assess necessary to complete eval    OT Frequency 2x / week    OT Duration 8 weeks   plus eval   OT Treatment/Interventions Self-care/ADL training;DME and/or AE instruction;Splinting;Therapeutic activities;Psychosocial skills training;Therapeutic exercise;Coping strategies training;Functional Mobility Training;Neuromuscular education;Passive range of motion;Visual/perceptual remediation/compensation;Patient/family education;Energy conservation;Manual Therapy    Plan coordination HEP for Rt hand, A/E recommendations (rocker knife, button hook, one handed cutting board)    Consulted and Agree with Plan of Care Patient             Patient will benefit from skilled therapeutic intervention in order to improve the following deficits and impairments:   Body Structure / Function / Physical Skills: ADL, Strength, Decreased knowledge of use of DME, Tone, Dexterity, Balance, Body mechanics, Proprioception, UE functional use, IADL, ROM, Coordination, Mobility, FMC, Decreased knowledge of precautions Cognitive Skills: Attention, Safety Awareness, Problem Solve Psychosocial Skills: Interpersonal Interaction   Visit Diagnosis: Other lack of coordination  Unsteadiness on feet  Muscle weakness (generalized)  Frontal lobe and executive function deficit    Problem List Patient Active Problem List   Diagnosis Date Noted   Autism 12/20/2020   Multiple sclerosis ([place]) 10/11/2020   MRI of brain abnormal 10/11/2020   Right sided weakness 10/11/2020   Gait disturbance 10/11/2020   Asperger syndrome 03/19/2013    Carey Bullocks, OTR/L 02/14/2021, 9:53 AM  [facility] [address] [place], [place], [address] Phone: [phone]   Fax:  [phone]  Name: LORENSO QUIRINO MRN: [id number] Date of Birth: Feb 10, 1971

## 2021-02-14 NOTE — Telephone Encounter (Signed)
MRI orders faxed to [facility]. Patient requesting sedation for MRI. They will call patient to schedule. Phone: [phone].

## 2021-02-16 DIAGNOSIS — G479 Sleep disorder, unspecified: Secondary | ICD-10-CM | POA: Diagnosis not present

## 2021-02-19 ENCOUNTER — Ambulatory Visit: Payer: PPO | Admitting: Neurology

## 2021-02-20 ENCOUNTER — Other Ambulatory Visit: Payer: Self-pay

## 2021-02-20 ENCOUNTER — Ambulatory Visit: Payer: PPO | Admitting: Physical Therapy

## 2021-02-20 ENCOUNTER — Ambulatory Visit: Payer: PPO | Admitting: Occupational Therapy

## 2021-02-20 DIAGNOSIS — R41844 Frontal lobe and executive function deficit: Secondary | ICD-10-CM

## 2021-02-20 DIAGNOSIS — M6281 Muscle weakness (generalized): Secondary | ICD-10-CM

## 2021-02-20 DIAGNOSIS — R2681 Unsteadiness on feet: Secondary | ICD-10-CM

## 2021-02-20 DIAGNOSIS — R2689 Other abnormalities of gait and mobility: Secondary | ICD-10-CM

## 2021-02-20 DIAGNOSIS — G35 Multiple sclerosis: Secondary | ICD-10-CM

## 2021-02-20 DIAGNOSIS — R278 Other lack of coordination: Secondary | ICD-10-CM

## 2021-02-20 NOTE — Therapy (Addendum)
[facility] [address] [place], [place], [address] Phone: [phone]   Fax:  [phone]  Occupational Therapy Treatment  Patient Details  Name: CAMBRIDGE DELEO MRN: [id number] Date of Birth: 01-13-1971 Referring Provider (OT): Dr. Lavone Orn   Encounter Date: 02/20/2021   OT End of Session - 02/20/21 0745     Visit Number 2    Number of Visits 17    Date for OT Re-Evaluation 04/17/21    Authorization Type [employer]    OT Start Time 0805    OT Stop Time 0846    OT Time Calculation (min) 41 min    Activity Tolerance Patient tolerated treatment well    Behavior During Therapy [facility] for tasks assessed/performed             Past Medical History:  Diagnosis Date   Anxiety    Asperger's syndrome    Autistic spectrum disorder    Asperger's (per H&P Olivia Clelland, PA)   Bipolar 1 disorder ([place])    "no mood swings in 20 years"  on medication   GERD (gastroesophageal reflux disease)    Head injury 2020   Hypertension    Hypothyroidism    IBS (irritable bowel syndrome)     Past Surgical History:  Procedure Laterality Date   NO PAST SURGERIES     RADIOLOGY WITH ANESTHESIA N/A 05/16/2020   Procedure: MRI BRAIN WITH AND WITHOUT CONTRAST;  Surgeon: Radiologist, Medication, MD;  Location: [place];  Service: Radiology;  Laterality: N/A;   RADIOLOGY WITH ANESTHESIA N/A 08/29/2020   Procedure: MRI WITH ANESTHESIA CERVICAL SPINE WITH AND WITHOUT CONTRAST, THORACIC SPINE WITH AND WITHOUT CONTRAST;  Surgeon: Radiologist, Medication, MD;  Location: [place];  Service: Radiology;  Laterality: N/A;    There were no vitals filed for this visit.   Subjective Assessment - 02/20/21 0745     Subjective  I can't open my hand anymore    Pertinent History MS suspected in his mid 30's, worsened in mid 2021. Pt also with Asperger syndrome and biplolar disease. In 2012, he had MRI of brain and was found to have multiple  T2/FLAIR hyerintensive foci. Over last year, pt began having more difficulty with Rt hand coordination and Rt foot drop. Had fall approx 10 mos ago, hitting head, no LOC.    Currently in Pain? No/denies              Pt inquires how he can see a Social worker.  Recommended pt contact PCP regarding request.  Recommended pt avoid lifting weights overhead due to risk of injury due to compensation and shoulder positioning.         OT Education - 02/20/21 0747     Education Details Coordination HEP--see pt instructions    Person(s) Educated Patient    Methods Explanation;Demonstration;Verbal cues;Handout    Comprehension Verbalized understanding;Returned demonstration              OT Short Term Goals - 02/14/21 0945       OT SHORT TERM GOAL #1   Title Independent with coordination HEP for Rt hand    Time 4    Period Weeks    Status New      OT SHORT TERM GOAL #2   Title Pt to verbalize understanding with potential A/E and DME needs greater safety, ease, and independence with ADLS (rocker knife, one handed cutting board, button hook, shower seat, etc)    Time 4  Period Weeks    Status New      OT SHORT TERM GOAL #3   Title Pt to verbalize understanding with energy conservation techniques and safety considerations with ADLS and driving including potential need for car adaptations and driving eval    Time 4    Period Weeks    Status New      OT SHORT TERM GOAL #4   Title Pt to verbalize understanding with potential assistive technology for typing and give contact info for local A.T. program    Time 4    Period Weeks    Status New               OT Long Term Goals - 02/14/21 0950       OT LONG TERM GOAL #1   Title Pt to improve coordination Rt hand as evidenced by performing 9 hole peg test in under 60 sec    Baseline 70.07 sec    Time 8    Period Weeks    Status New      OT LONG TERM GOAL #2   Title Pt to report less overall drops Rt hand with bilateral  tasks    Time 8    Period Weeks    Status New      OT LONG TERM GOAL #3   Title Pt to demo safety with cooking tasks using A/E and compensations prn    Time 8    Period Weeks    Status New      OT LONG TERM GOAL #4   Title Improve grip strength Rt hand by 10 lbs to assist in opening tight jars/containers    Baseline 58.6 lbs (Lt = 106.9)    Time 8    Period Weeks    Status New                   Plan - 02/20/21 0747     Clinical Impression Statement Pt verbalized understanding of coordination HEP, but needs encourage/prompts when difficult due to frustration and cueing for shoulder compensation.  However, pt demo improvement with repetition/cues.    OT Occupational Profile and History Detailed Assessment- Review of Records and additional review of physical, cognitive, psychosocial history related to current functional performance    Occupational performance deficits (Please refer to evaluation for details): ADL's;IADL's;Work;Leisure;Social Participation    Body Structure / Function / Physical Skills ADL;Strength;Decreased knowledge of use of DME;Tone;Dexterity;Balance;Body mechanics;Proprioception;UE functional use;IADL;ROM;Coordination;Mobility;FMC;Decreased knowledge of precautions    Cognitive Skills Attention;Safety Awareness;Problem Solve    Psychosocial Skills Interpersonal Interaction    Rehab Potential Fair   decreased awareness   Comorbidities Affecting Occupational Performance: Presence of comorbidities impacting occupational performance    Comorbidities impacting occupational performance description: Aspergers, bipolar    Modification or Assistance to Complete Evaluation  Min-Moderate modification of tasks or assist with assess necessary to complete eval    OT Frequency 2x / week    OT Duration 8 weeks   plus eval   OT Treatment/Interventions Self-care/ADL training;DME and/or AE instruction;Splinting;Therapeutic activities;Psychosocial skills training;Therapeutic  exercise;Coping strategies training;Functional Mobility Training;Neuromuscular education;Passive range of motion;Visual/perceptual remediation/compensation;Patient/family education;Energy conservation;Manual Therapy    Plan shoulder HEP (wt. bearing/scapular stabilty), A/E recommendations (rocker knife, button hook, one handed cutting board)    Consulted and Agree with Plan of Care Patient             Patient will benefit from skilled therapeutic intervention in order to improve the following deficits and impairments:  Body Structure / Function / Physical Skills: ADL, Strength, Decreased knowledge of use of DME, Tone, Dexterity, Balance, Body mechanics, Proprioception, UE functional use, IADL, ROM, Coordination, Mobility, FMC, Decreased knowledge of precautions Cognitive Skills: Attention, Safety Awareness, Problem Solve Psychosocial Skills: Interpersonal Interaction   Visit Diagnosis: Other lack of coordination  Unsteadiness on feet  Muscle weakness (generalized)  Frontal lobe and executive function deficit  Other abnormalities of gait and mobility    Problem List Patient Active Problem List   Diagnosis Date Noted   Autism 12/20/2020   Multiple sclerosis ([place]) 10/11/2020   MRI of brain abnormal 10/11/2020   Right sided weakness 10/11/2020   Gait disturbance 10/11/2020   Asperger syndrome 03/19/2013    [facility] 02/20/2021, 8:59 AM  [facility] [address] [place], [place], [address] Phone: [phone]   Fax:  [phone]  Name: ECTOR LAUREL MRN: [id number] Date of Birth: 08-07-1970  Vianne Bulls, OTR/L [facility] [address] [place], [place]  [address] [phone] phone [phone] 02/20/21 8:59 AM

## 2021-02-20 NOTE — Therapy (Signed)
[facility] [address] [place], [place], [address] Phone: [phone]   Fax:  [phone]  Physical Therapy Treatment  Patient Details  Name: George [name] MRN: [id number] Date of Birth: Mar 23, 1971 Referring Provider (PT): Lavone Orn, MD   Encounter Date: 02/20/2021   PT End of Session - 02/20/21 0909     Visit Number 2    Number of Visits 6    Date for PT Re-Evaluation 03/28/21    Authorization Type [employer]    PT Start Time 0850    PT Stop Time 0930    PT Time Calculation (min) 40 min    Activity Tolerance Patient tolerated treatment well    Behavior During Therapy [facility] for tasks assessed/performed             Past Medical History:  Diagnosis Date   Anxiety    Asperger's syndrome    Autistic spectrum disorder    Asperger's (per H&P Olivia Clelland, PA)   Bipolar 1 disorder ([place])    "no mood swings in 20 years"  on medication   GERD (gastroesophageal reflux disease)    Head injury 2020   Hypertension    Hypothyroidism    IBS (irritable bowel syndrome)     Past Surgical History:  Procedure Laterality Date   NO PAST SURGERIES     RADIOLOGY WITH ANESTHESIA N/A 05/16/2020   Procedure: MRI BRAIN WITH AND WITHOUT CONTRAST;  Surgeon: Radiologist, Medication, MD;  Location: [facility];  Service: Radiology;  Laterality: N/A;   RADIOLOGY WITH ANESTHESIA N/A 08/29/2020   Procedure: MRI WITH ANESTHESIA CERVICAL SPINE WITH AND WITHOUT CONTRAST, THORACIC SPINE WITH AND WITHOUT CONTRAST;  Surgeon: Radiologist, Medication, MD;  Location: [facility];  Service: Radiology;  Laterality: N/A;    There were no vitals filed for this visit.   Subjective Assessment - 02/20/21 0854     Subjective Pt states he's been using machines for strengthening. He reports using 20# for hamstring curl and knee extension.    Pertinent History h/o Asperger syndrome and bipolar disease    Limitations Standing;Walking;House hold  activities    How long can you stand comfortably? ~1 hr he needs to sit down    How long can you walk comfortably? ~30 minutes of walking; limited due to fatigue    Diagnostic tests MRI: A tiny T2 hyperintense lesion is questioned within the right  dorsolateral spinal cord at the C5 level (versus artifact). No other  cervical spinal cord signal abnormality is identified. No abnormal  cord enhancement.  Mild cervical spondylosis as described. No significant spinal canal  or foraminal stenosis.    Patient Stated Goals Improve his balance and walking                [facility] PT Assessment - 02/20/21 0001       Balance   Balance Assessed Yes      High Level Balance   High Level Balance Comments Feet apart EO/EC 30 sec; Feet together EO/EC 30 sec with some sway; on foam feet apart EO/EC: 30 sec; on foam feet together EO 30 sec, EC 5 sec.                           [facility] Adult PT Treatment/Exercise - 02/20/21 0001       Exercises   Exercises Ankle;Knee/Hip      Knee/Hip Exercises: Machines for Strengthening   Cybex Leg Press 2x10  eccentric R LE 70#      Knee/Hip Exercises: Standing   Heel Raises Both;2 sets;10 reps    Heel Raises Limitations toe raises 2x10    Other Standing Knee Exercises on foam feet together head turns 2x10      Knee/Hip Exercises: Supine   Straight Leg Raises Strengthening;5 reps   Increased pain   Straight Leg Raises Limitations able to perform 10 with SAQ first into SLR      Knee/Hip Exercises: Sidelying   Hip ABduction Strengthening;Right;2 sets;10 reps;Left      Knee/Hip Exercises: Prone   Hamstring Curl 2 sets;10 reps    Hamstring Curl Limitations green tband    Hip Extension Strengthening;Both;2 sets;10 reps    Hip Extension Limitations green tband                         PT Long Term Goals - 02/14/21 1257       PT LONG TERM GOAL #1   Title Pt will be independent with progressing his strengthening and balance HEP     Time 6    Period Weeks    Status New    Target Date 03/28/21      PT LONG TERM GOAL #2   Title Pt will have improved FGA score to at least 24/30 to demo low fall risk    Time 6    Period Weeks    Status New    Target Date 03/28/21      PT LONG TERM GOAL #3   Title Pt will be able to perform 5x STS safely with no LOBs    Time 6    Period Weeks    Status New    Target Date 03/28/21      PT LONG TERM GOAL #4   Title Pt will be able to tolerate ambulating >1000' with no LOBs outdoors and indoors for safe community mobility    Time 6    Period Weeks    Status New    Target Date 03/28/21                   Plan - 02/20/21 0913     Clinical Impression Statement [name] has been compliant with his exercises. Treatment focused on continued R LE strengthening and initiate balance exercises. Pt tolerated treatment well. Greatest difficulty maintaining balance on compliant surfaces with eyes closed.    Personal Factors and Comorbidities Comorbidity 1;Comorbidity 2;Age;Fitness;Time since onset of injury/illness/exacerbation    Comorbidities MS, Asperger's    Examination-Activity Limitations Locomotion Level;Squat;Carry;Stand    Examination-Participation Restrictions Community Activity;Yard Work;Volunteer;Shop    Stability/Clinical Decision Making Stable/Uncomplicated    Rehab Potential Good    PT Frequency 1x / week    PT Duration 6 weeks    PT Treatment/Interventions ADLs/Self Care Home Management;Aquatic Therapy;DME Instruction;Gait training;Stair training;Functional mobility training;Therapeutic activities;Therapeutic exercise;Balance training;Neuromuscular re-education;Patient/family education;Orthotic Fit/Training;Manual techniques;Passive range of motion;Energy conservation;Taping    PT Next Visit Plan Work on strengthening R quad, hamstring, and ankle. Work on balance on compliant surface and narrow BOS head turns or eyes closed.    PT Home Exercise Plan Access Code  [email or web address]    Consulted and Agree with Plan of Care Patient             Patient will benefit from skilled therapeutic intervention in order to improve the following deficits and impairments:  Abnormal gait, Difficulty walking, Decreased balance, Decreased endurance, Decreased activity tolerance, Decreased mobility, Decreased strength,  Impaired UE functional use  Visit Diagnosis: Unsteadiness on feet  Muscle weakness (generalized)  Other abnormalities of gait and mobility  MS (multiple sclerosis) ([place])     Problem List Patient Active Problem List   Diagnosis Date Noted   Autism 12/20/2020   Multiple sclerosis ([place]) 10/11/2020   MRI of brain abnormal 10/11/2020   Right sided weakness 10/11/2020   Gait disturbance 10/11/2020   Asperger syndrome 03/19/2013    Cherl Gorney April Ma L Anaissa Macfadden PT, DPT 02/20/2021, 9:32 AM  [facility] [address] [place], [place], [address] Phone: [phone]   Fax:  [phone]  Name: George [name] MRN: [id number] Date of Birth: 15-Aug-1970

## 2021-02-20 NOTE — Patient Instructions (Addendum)
     Coordination Activities  Perform the following activities for 20 minutes 1 times per day with right hand(s).  Keep your shoulder/elbow down   Rotate ball in fingertips (clockwise and counter-clockwise). Toss ball in air and catch with the same hand. Flip cards 1 at a time.  Open all fingers. Deal cards with your thumb (Hold deck in hand and push card off top with thumb). Pick up coins and place in container or coin bank. Pick up coins and stack one at a time Pick up checkers one at a time until you get 3 in your hand, then move checkers from palm to fingertips to place in container  one at a time. Twist bottle caps on/off (bottle upright with left hand), take off top with right hand, twist nuts/bolts (keep left hand still)

## 2021-02-21 ENCOUNTER — Telehealth: Payer: Self-pay | Admitting: Neurology

## 2021-02-21 NOTE — Telephone Encounter (Signed)
Called pt back. When he started Copaxone, bumps on skin post injection stayed for 5 days. He would rotate injection site each week and it was ok. However, bumps staying 7-8 days now. Does not have enough sites to rotate through since bumps are not resolving as quick. Wanting to know how he should proceed.   He also wanted Dr. Felecia Shelling to know [facility] is [facility]. Hand and leg is getting stronger. Exercising 6-7 times per week. Thinks this has helped improve sx. Advised I will discuss w/ Dr. Felecia Shelling and call back.

## 2021-02-21 NOTE — Telephone Encounter (Signed)
LVM returning pt call.  Appears he has MRI brain/cervical scheduled for 03/15/21 at [facility] (requesting sedation). Dr. Felecia Shelling was going to change DMT if there was progression on imaging

## 2021-02-21 NOTE — Telephone Encounter (Signed)
Pt called stating the Glatiramer Acetate (COPAXONE) 40 MG/ML SOSY are leaving bumps on his skin for several weeks which is preventing him to give himself multiple injections. Pt thinks he may need another type of medication. Pt is requesting a call back.

## 2021-02-22 NOTE — Telephone Encounter (Signed)
Called pt. Relayed Dr. Garth Bigness message. He wants to come in and discuss DMT options in person w/ Dr. Felecia Shelling. Scheduled work in appt for 02/26/21 at 330p, check in 300pm. He will continue copaxone until he comes in to speak with MD.

## 2021-02-26 ENCOUNTER — Ambulatory Visit: Payer: PPO | Admitting: Neurology

## 2021-02-26 ENCOUNTER — Other Ambulatory Visit: Payer: Self-pay

## 2021-02-26 ENCOUNTER — Encounter: Payer: Self-pay | Admitting: Neurology

## 2021-02-26 VITALS — BP 139/90 | HR 81 | Ht 67.75 in | Wt 156.0 lb

## 2021-02-26 DIAGNOSIS — G35 Multiple sclerosis: Secondary | ICD-10-CM

## 2021-02-26 DIAGNOSIS — R269 Unspecified abnormalities of gait and mobility: Secondary | ICD-10-CM | POA: Diagnosis not present

## 2021-02-26 DIAGNOSIS — R4781 Slurred speech: Secondary | ICD-10-CM

## 2021-02-26 DIAGNOSIS — R531 Weakness: Secondary | ICD-10-CM

## 2021-02-26 DIAGNOSIS — F840 Autistic disorder: Secondary | ICD-10-CM | POA: Diagnosis not present

## 2021-02-26 NOTE — Progress Notes (Signed)
[facility]  PATIENT: George [name] DOB: 1971-06-21  REFERRING DOCTOR OR PCP: PCP is Lavone Orn.  Patient has seen Dr. Leta Baptist SOURCE: Patient, mother, notes from Dr. Leta Baptist, MRI imaging reports and lab reports.  MRI images personally reviewed  _________________________________   HISTORICAL  CHIEF COMPLAINT:  Chief Complaint  Patient presents with   Follow-up    RM 2, alone. Here to discuss DMT options.    HISTORY OF PRESENT ILLNESS:  George [name] is a 50 y.o. man withrelapsing remitting multiple sclerosis.  Update  02/26/2021 He started glatiramer acetate for the MS earlier this year and he tolerates it well systemically but has skin reactions with nodules lasting 2 weeks.  He denies any exacerbations.    He had wanted to change to a differnet DMT after a large painful nodule a couple weeks ago but after mor ethought would like to remian on glatiramer as concerned about Aubagio and interferon liver issues and DMF GI issues (has irrritable bowel).     Neurologically, gait is mildly unbalanced but improved from a couple months ago.  He stumbles but has no falls over the last few weeks.  Sometimes the right foot will drag when he walks.  He denies any significant numbness.  Vision is fine.  Bladder function is usually good though he has some urgency with use of caffeine.  He notes some fatigue and.  He sleeps well most nights.    He notes some fatigue but does better with B12 supplements.   Vit D was low and he is advised to take 2000 U daily   MS HISTORY: He has a h/o Asperger syndrome and bipolar disease.   In 2012, he had an MRI of the brain and was found to have multiple T2/FLAIR hyperintense foci in the brain.   A lumbar puncture at the time was negative.    Over the last year, he began to have more difficulty with right hand coordination and left foot drop.    Also the right side of his face was drooping some.     He came in to see Dr. Leta Baptist and had  repeat imaging studies showing progression of the white matter foci and the appearance of a small focus at C5.    Repeat LP, however, showed negative CSF (no OCB) again.   He started glatiramer 10/2020.    OTHER MEDICAL HISTORY He has bipolar disease (sees Dr. Toy Care).  He also has autism.   He is on Depakote.   He notes being more emotional and easily gets overwhelmed with emotions.   This had happened when he was younger but then got better again.      He has irritable bowel syndrome and takes B12 since he was told absorption may be affected.   B12 level was fine.     Imaging studies reviewed: MRI of the brain 11/21/2010 shows multiple T2/FLAIR hyperintense foci including a focus in the left pons, middle cerebellar peduncle, right thalamus.  Some foci of periventricular and some foci are juxtacortical.  None of the foci enhanced.  MRI of the brain 05/16/2020 showed a similar pattern at the 2012 MRI but there has been mild progression with at least 2 new lesions noted in the white matter compared to the previous MRI.  As seen before there is a large focus involving the left greater than right pons.  It may be slightly larger.  There is also involvement of the middle cerebellar peduncle which is stable  on the right but a new focus on the left.  There is one stable focus in the right thalamus.  MRI of the cervical spine 08/29/2020 showed a small focus posteriorly to the right adjacent to C5.  Mild DJD at C5-C6 not causing nerve root compression.  MRI of the thoracic spine 08/29/2020 showed a normal spinal cord (subtle focus at T10-T11 more likely artifact).  There are cystic lesions within the kidneys  Laboratory results. ANA, ANCA, HIV, hep B antibodies, hep C antibody were all negative or normal.  Vitamin D was mildly low.  Vitamin B12 was fine (elevated), creatinine was mildly elevated.  JCV antibody was positive at 1.3.  There were no oligoclonal bands in the CSF.  Protein was mildly elevated at  54.  REVIEW OF SYSTEMS: Constitutional: No fevers, chills, sweats, or change in appetite Eyes: No visual changes, double vision, eye pain Ear, nose and throat: No hearing loss, ear pain, nasal congestion, sore throat Cardiovascular: No chest pain, palpitations Respiratory:  No shortness of breath at rest or with exertion.   No wheezes GastrointestinaI: No nausea, vomiting, diarrhea, abdominal pain, fecal incontinence Genitourinary:  No dysuria, urinary retention or frequency.  No nocturia. Musculoskeletal:  No neck pain, back pain Integumentary: No rash, pruritus, skin lesions Neurological: as above Psychiatric: No depression at this time.  No anxiety Endocrine: No palpitations, diaphoresis, change in appetite, change in weigh or increased thirst Hematologic/Lymphatic:  No anemia, purpura, petechiae. Allergic/Immunologic: No itchy/runny eyes, nasal congestion, recent allergic reactions, rashes  ALLERGIES: Allergies  Allergen Reactions   Other Other (See Comments)    Can only eat at home    HOME MEDICATIONS:  Current Outpatient Medications:    amLODipine-valsartan (EXFORGE) 5-160 MG tablet, Take 1 tablet by mouth daily., Disp: , Rfl:    busPIRone (BUSPAR) 10 MG tablet, Take 20 mg by mouth 2 (two) times daily., Disp: , Rfl:    cholecalciferol (VITAMIN D) 1000 units tablet, Take 1,000 Units by mouth 2 (two) times a week., Disp: , Rfl:    CHOLINE PO, Take 300 mg by mouth., Disp: , Rfl:    Cyanocobalamin (VITAMIN B-12) 5000 MCG TBDP, Place 5,000 Units under the tongue daily., Disp: , Rfl:    divalproex (DEPAKOTE) 250 MG DR tablet, Take 1,250 mg by mouth daily. , Disp: , Rfl:    Glatiramer Acetate (COPAXONE) 40 MG/ML SOSY, Inject 40 mg into the skin 3 (three) times a week., Disp: 11.76 mL, Rfl: 12   SYNTHROID 100 MCG tablet, Take 100 mcg by mouth daily before breakfast. , Disp: , Rfl:    testosterone enanthate (DELATESTRYL) 200 MG/ML injection, Inject 50-200 mg into the muscle See admin  instructions. For IM use only  Every three weeks, Disp: , Rfl:   PAST MEDICAL HISTORY: Past Medical History:  Diagnosis Date   Anxiety    Asperger's syndrome    Autistic spectrum disorder    Asperger's (per H&P Olivia Clelland, PA)   Bipolar 1 disorder ([place])    "no mood swings in 20 years"  on medication   GERD (gastroesophageal reflux disease)    Head injury 2020   Hypertension    Hypothyroidism    IBS (irritable bowel syndrome)     PAST SURGICAL HISTORY: Past Surgical History:  Procedure Laterality Date   NO PAST SURGERIES     RADIOLOGY WITH ANESTHESIA N/A 05/16/2020   Procedure: MRI BRAIN WITH AND WITHOUT CONTRAST;  Surgeon: Radiologist, Medication, MD;  Location: [facility];  Service: Radiology;  Laterality: N/A;   RADIOLOGY WITH ANESTHESIA N/A 08/29/2020   Procedure: MRI WITH ANESTHESIA CERVICAL SPINE WITH AND WITHOUT CONTRAST, THORACIC SPINE WITH AND WITHOUT CONTRAST;  Surgeon: Radiologist, Medication, MD;  Location: [facility];  Service: Radiology;  Laterality: N/A;    FAMILY HISTORY: Family History  Problem Relation Age of Onset   Hypertension Father    Depression Sister    Hypertension Paternal Grandfather     SOCIAL HISTORY:  Social History   Socioeconomic History   Marital status: Single    Spouse name: Not on file   Number of children: 0   Years of education: college   Highest education level: Not on file  Occupational History    Comment: artist  Tobacco Use   Smoking status: Never   Smokeless tobacco: Never  Vaping Use   Vaping Use: Never used  Substance and Sexual Activity   Alcohol use: No   Drug use: No   Sexual activity: Not on file  Other Topics Concern   Not on file  Social History Narrative   He lives at home.    He is single and has no children.     He denies use of tobacco, alcohol, illicit drugs and caffeine.    He works as an Training and development officer.    Oncologist: Not on [employer] Insecurity: Not on file   Transportation Needs: Not on file  Physical Activity: Not on file  Stress: Not on file  Social Connections: Not on file  Intimate Partner Violence: Not on file     PHYSICAL EXAM  Vitals:   02/26/21 1533  BP: 139/90  Pulse: 81  Weight: 156 lb (70.8 kg)  Height: 5' 7.75" (1.721 m)    Body mass index is 23.9 kg/m.   General: The patient is well-developed and well-nourished and in no acute distress  HEENT:  Head is [place]/AT.  Sclera are anicteric.   Neck:  The neck is nontender.  Skin: Extremities are without rash or  edema.   Neurologic Exam  Mental status: The patient is alert and oriented x 3 at the time of the examination. The patient has apparent normal recent and remote memory, with an apparently normal attention span and concentration ability.   Speech is normal.  Cranial nerves: Extraocular movements are full.   He has mild weakness in the right lower face (normal strength in forehead).   There is good facial sensation to soft touch bilaterally. Facial strength is normal.  Trapezius and sternocleidomastoid strength is normal. No dysarthria is noted.    No obvious hearing deficits are noted.  Motor:  Muscle bulk is normal.   Tone is normal. Strength is  5 / 5 in left side, 5-/5 right arm (mildly reduced RAM)  4+/5 right  leg  Sensory: Sensory testing is intact to pinprick, soft touch and vibration sensation in all 4 extremities.  Coordination: Cerebellar testing reveals mildly reduced coordination right hand and foot.   Reduced heel-to-shin.  Gait and station: Station is normal.   He has a right foot drop.  The tandem gait is wide.   Romberg is negative.   Reflexes: Deep tendon reflexes are symmetric and normal in arms and increased right leg.        DIAGNOSTIC DATA (LABS, IMAGING, TESTING) - I reviewed patient records, labs, notes, testing and imaging myself where available.  Lab Results  Component Value Date   WBC 7.1 09/11/2020   HGB 17.4  09/11/2020   HCT  52.4 (H) 09/11/2020   MCV 85 09/11/2020   PLT 206 09/11/2020      Component Value Date/Time   NA 142 09/11/2020 1035   K 4.6 09/11/2020 1035   CL 101 09/11/2020 1035   CO2 21 09/11/2020 1035   GLUCOSE 82 09/11/2020 1035   GLUCOSE 88 08/29/2020 1019   BUN 16 09/11/2020 1035   CREATININE 1.33 (H) 09/11/2020 1035   CREATININE 1.14 03/04/2014 1310   CALCIUM 9.9 09/11/2020 1035   PROT 7.3 09/11/2020 1035   ALBUMIN 4.6 09/11/2020 1035   AST 16 09/11/2020 1035   ALT 20 09/11/2020 1035   ALKPHOS 67 09/11/2020 1035   BILITOT 0.3 09/11/2020 1035   GFRNONAA 62 09/11/2020 1035   GFRNONAA >60 08/29/2020 1019   GFRAA 72 09/11/2020 1035   No results found for: CHOL, HDL, LDLCALC, LDLDIRECT, TRIG, CHOLHDL Lab Results  Component Value Date   HGBA1C 5.4 09/11/2020   Lab Results  Component Value Date   VITAMINB12 >2000 (H) 09/11/2020   Lab Results  Component Value Date   TSH 1.400 09/11/2020       ASSESSMENT AND PLAN  Multiple sclerosis (HCC)  Gait disturbance  Right sided weakness  Slurred speech  Autism   1.   For now, he will continue glatiramer.   We will check an MRI of the brain and consider a different disease modifying therapy if he has had subclinical progression.  We discussed [employer] if he chooses to change due to skin reactions or if he has  relapses or new lesions on MRI 2.   Stay active and exercie as tolerated. 3.   return to see [place] in 4 months or sooner for new or worsening neurologic symptoms.   [place] A. Felecia Shelling, MD, [facility] [id number], [id number] PM Certified in Neurology, Clinical Neurophysiology, Sleep Medicine and Neuroimaging  [facility] [address], [place], [place] [address] [phone]

## 2021-02-28 ENCOUNTER — Other Ambulatory Visit: Payer: Self-pay

## 2021-02-28 ENCOUNTER — Ambulatory Visit: Payer: PPO | Admitting: Occupational Therapy

## 2021-02-28 ENCOUNTER — Ambulatory Visit: Payer: PPO

## 2021-02-28 DIAGNOSIS — M6281 Muscle weakness (generalized): Secondary | ICD-10-CM | POA: Diagnosis not present

## 2021-02-28 DIAGNOSIS — R278 Other lack of coordination: Secondary | ICD-10-CM

## 2021-02-28 NOTE — Therapy (Signed)
[facility] [address] [place], [place], [address] Phone: [phone]   Fax:  [phone]  Occupational Therapy Treatment  Patient Details  Name: George [name] MRN: [id number] Date of Birth: Jan 02, 1971 Referring Provider (OT): Dr. Lavone Orn   Encounter Date: 02/28/2021   OT End of Session - 02/28/21 0831     Visit Number 3    Number of Visits 17    Date for OT Re-Evaluation 04/17/21    Authorization Type [employer]    OT Start Time 0802    OT Stop Time 0845    OT Time Calculation (min) 43 min    Activity Tolerance Patient tolerated treatment well    Behavior During Therapy [facility] for tasks assessed/performed             Past Medical History:  Diagnosis Date   Anxiety    Asperger's syndrome    Autistic spectrum disorder    Asperger's (per H&P Olivia Clelland, PA)   Bipolar 1 disorder ([place])    "no mood swings in 20 years"  on medication   GERD (gastroesophageal reflux disease)    Head injury 2020   Hypertension    Hypothyroidism    IBS (irritable bowel syndrome)     Past Surgical History:  Procedure Laterality Date   NO PAST SURGERIES     RADIOLOGY WITH ANESTHESIA N/A 05/16/2020   Procedure: MRI BRAIN WITH AND WITHOUT CONTRAST;  Surgeon: Radiologist, Medication, MD;  Location: [facility];  Service: Radiology;  Laterality: N/A;   RADIOLOGY WITH ANESTHESIA N/A 08/29/2020   Procedure: MRI WITH ANESTHESIA CERVICAL SPINE WITH AND WITHOUT CONTRAST, THORACIC SPINE WITH AND WITHOUT CONTRAST;  Surgeon: Radiologist, Medication, MD;  Location: [facility];  Service: Radiology;  Laterality: N/A;    There were no vitals filed for this visit.   Subjective Assessment - 02/28/21 0808     Subjective  I got all the stuff for my coordination ex's    Pertinent History MS suspected in his mid 30's, worsened in mid 2021. Pt also with Asperger syndrome and biplolar disease. In 2012, he had MRI of brain and was found to  have multiple T2/FLAIR hyerintensive foci. Over last year, pt began having more difficulty with Rt hand coordination and Rt foot drop. Had fall approx 10 mos ago, hitting head, no LOC.    Currently in Pain? No/denies             Reviewed coordination HEP - pt required cues to prevent sh compensations, but frustration level better.   Pt placing medium sized pegs in pegboard Rt hand while copying peg design with assist to correct 1 mistake. Pt then removing with Rt hand.   Pt shown A/E for cutting food and hooking buttons and provided handouts on rocker knife, one handed cutting board, and button hook. Pt also provided w/ red foam to put on fork to stabilize meat if cutting traditional way. Pt reports he does not need shower seat at this time but does have grab bars in shower.                      OT Education - 02/28/21 0830     Education Details A/E recommendations, review of coordination HEP    Person(s) Educated Patient    Methods Explanation;Demonstration;Handout    Comprehension Verbalized understanding;Returned demonstration              OT Short Term Goals - 02/28/21 [id number]  OT SHORT TERM GOAL #1   Title Independent with coordination HEP for Rt hand    Time 4    Period Weeks    Status On-going      OT SHORT TERM GOAL #2   Title Pt to verbalize understanding with potential A/E and DME needs greater safety, ease, and independence with ADLS (rocker knife, one handed cutting board, button hook, shower seat, etc)    Time 4    Period Weeks    Status On-going      OT SHORT TERM GOAL #3   Title Pt to verbalize understanding with energy conservation techniques and safety considerations with ADLS and driving including potential need for car adaptations and driving eval    Time 4    Period Weeks    Status New      OT SHORT TERM GOAL #4   Title Pt to verbalize understanding with potential assistive technology for typing and give contact info for local  A.T. program    Time 4    Period Weeks    Status New               OT Long Term Goals - 02/14/21 0950       OT LONG TERM GOAL #1   Title Pt to improve coordination Rt hand as evidenced by performing 9 hole peg test in under 60 sec    Baseline 70.07 sec    Time 8    Period Weeks    Status New      OT LONG TERM GOAL #2   Title Pt to report less overall drops Rt hand with bilateral tasks    Time 8    Period Weeks    Status New      OT LONG TERM GOAL #3   Title Pt to demo safety with cooking tasks using A/E and compensations prn    Time 8    Period Weeks    Status New      OT LONG TERM GOAL #4   Title Improve grip strength Rt hand by 10 lbs to assist in opening tight jars/containers    Baseline 58.6 lbs (Lt = 106.9)    Time 8    Period Weeks    Status New                   Plan - 02/28/21 1007     Clinical Impression Statement Pt benefited from review of coordination HEP and reports he is better but still demo compensations in shoulder    OT Occupational Profile and History Detailed Assessment- Review of Records and additional review of physical, cognitive, psychosocial history related to current functional performance    Occupational performance deficits (Please refer to evaluation for details): ADL's;IADL's;Work;Leisure;Social Participation    Body Structure / Function / Physical Skills ADL;Strength;Decreased knowledge of use of DME;Tone;Dexterity;Balance;Body mechanics;Proprioception;UE functional use;IADL;ROM;Coordination;Mobility;FMC;Decreased knowledge of precautions    Cognitive Skills Attention;Safety Awareness;Problem Solve    Psychosocial Skills Interpersonal Interaction    Rehab Potential Fair   decreased awareness   Comorbidities Affecting Occupational Performance: Presence of comorbidities impacting occupational performance    Comorbidities impacting occupational performance description: Aspergers, bipolar    Modification or Assistance to  Complete Evaluation  Min-Moderate modification of tasks or assist with assess necessary to complete eval    OT Frequency 2x / week    OT Duration 8 weeks   plus eval   OT Treatment/Interventions Self-care/ADL training;DME and/or AE instruction;Splinting;Therapeutic activities;Psychosocial skills training;Therapeutic exercise;Coping  strategies training;Functional Mobility Training;Neuromuscular education;Passive range of motion;Visual/perceptual remediation/compensation;Patient/family education;Energy conservation;Manual Therapy    Plan shoulder HEP (wt. bearing/scapular stabilty), energy conservation techniques    Consulted and Agree with Plan of Care Patient             Patient will benefit from skilled therapeutic intervention in order to improve the following deficits and impairments:   Body Structure / Function / Physical Skills: ADL, Strength, Decreased knowledge of use of DME, Tone, Dexterity, Balance, Body mechanics, Proprioception, UE functional use, IADL, ROM, Coordination, Mobility, FMC, Decreased knowledge of precautions Cognitive Skills: Attention, Safety Awareness, Problem Solve Psychosocial Skills: Interpersonal Interaction   Visit Diagnosis: Other lack of coordination  Muscle weakness (generalized)    Problem List Patient Active Problem List   Diagnosis Date Noted   Slurred speech 02/26/2021   Autism 12/20/2020   Multiple sclerosis ([place]) 10/11/2020   MRI of brain abnormal 10/11/2020   Right sided weakness 10/11/2020   Gait disturbance 10/11/2020   Asperger syndrome 03/19/2013    Carey Bullocks, OTR/L 02/28/2021, 10:33 AM  [facility] [address] [place], [place], [address] Phone: [phone]   Fax:  [phone]  Name: George [name] MRN: [id number] Date of Birth: 03/11/1971

## 2021-03-01 ENCOUNTER — Ambulatory Visit: Payer: PPO | Admitting: Occupational Therapy

## 2021-03-01 DIAGNOSIS — M6281 Muscle weakness (generalized): Secondary | ICD-10-CM | POA: Diagnosis not present

## 2021-03-01 DIAGNOSIS — R41844 Frontal lobe and executive function deficit: Secondary | ICD-10-CM

## 2021-03-01 DIAGNOSIS — R278 Other lack of coordination: Secondary | ICD-10-CM

## 2021-03-01 NOTE — Patient Instructions (Signed)
Energy Conservation Techniques  Sit for as many activities as possible. Use slow, smooth movements.  Rushing increases discomfort. Determine the necessity of performing the task.  Simplify those tasks that are necessary.  (Get clothes out of the dryer when they are warm instead of ironing, let dishes air dry, etc.) Take frequent rests both during and between activities.  Avoid repetitive tasks. Pre-plan your activities; try a daily and/or weekly schedule.  Spread out the activities that are most fatiguing (break up cleaning tasks over multiple days). Remember to plan a balance of work, rest and recreation. Consider the best time for each activity.  Do the most exertive task when you have the most energy. Don't carry items if you can push them.  Slide, don't lift. Push, don't pull. Utilize two hands when appropriate. Maintain good posture and use proper body mechanics. Avoid remaining in one position for too long. When lifting, bend at the knees, not at the waist.  Exhale when bending down, inhale when straightening up. Carry objects as close to your body and as near to the center of the pelvis.  11. Avoid wasted body movements (position yourself for the task so that you avoid bending, twisting, etc. when possible). 12. Select the best working environment.  Consider lighting, ventilation, clothing, and equipment. 13. Organize your storage areas, making the items you use daily convenient.  Store heaviest items at waist            height.  Store frequently used items between shoulders and knee height.  Consider leaving frequently used       items on countertops.  (You can organize in storage baskets based on time used/purpose). 14. Feelings and emotions can be real causes of fatigue.  Try to avoid unnecessary worry, irritation, or  frustration.  Avoid stress, it can also be a source of fatigue. 15. Get help from other people for difficult tasks. 16. Explore equipment or items that may be able to do  the job for you with greater ease.  (Electric can  openers, blenders, lightweight items for cleaning, etc.) 

## 2021-03-01 NOTE — Therapy (Signed)
[facility] [address] [place], [place], [address] Phone: [phone]   Fax:  [phone]  Occupational Therapy Treatment  Patient Details  Name: George [name] MRN: [id number] Date of Birth: Jun 19, 1971 Referring Provider (OT): Dr. Lavone Orn   Encounter Date: 03/01/2021   OT End of Session - 03/01/21 0849     Visit Number 4    Number of Visits 17    Date for OT Re-Evaluation 04/17/21    Authorization Type [employer]    OT Start Time 0803    OT Stop Time 0845    OT Time Calculation (min) 42 min    Activity Tolerance Patient tolerated treatment well    Behavior During Therapy [facility] for tasks assessed/performed             Past Medical History:  Diagnosis Date   Anxiety    Asperger's syndrome    Autistic spectrum disorder    Asperger's (per H&P Olivia Clelland, PA)   Bipolar 1 disorder ([place])    "no mood swings in 20 years"  on medication   GERD (gastroesophageal reflux disease)    Head injury 2020   Hypertension    Hypothyroidism    IBS (irritable bowel syndrome)     Past Surgical History:  Procedure Laterality Date   NO PAST SURGERIES     RADIOLOGY WITH ANESTHESIA N/A 05/16/2020   Procedure: MRI BRAIN WITH AND WITHOUT CONTRAST;  Surgeon: Radiologist, Medication, MD;  Location: [facility];  Service: Radiology;  Laterality: N/A;   RADIOLOGY WITH ANESTHESIA N/A 08/29/2020   Procedure: MRI WITH ANESTHESIA CERVICAL SPINE WITH AND WITHOUT CONTRAST, THORACIC SPINE WITH AND WITHOUT CONTRAST;  Surgeon: Radiologist, Medication, MD;  Location: [facility];  Service: Radiology;  Laterality: N/A;    There were no vitals filed for this visit.   Subjective Assessment - 03/01/21 0807     Pertinent History MS suspected in his mid 30's, worsened in mid 2021. Pt also with Asperger syndrome and biplolar disease. In 2012, he had MRI of brain and was found to have multiple T2/FLAIR hyerintensive foci. Over last year,  pt began having more difficulty with Rt hand coordination and Rt foot drop. Had fall approx 10 mos ago, hitting head, no LOC.    Currently in Pain? No/denies               Prone: bilateral scapula retraction with tactile cues and verbal cues required to perform correctly. Prone on elbows: trunk extension wi/ scapula depression. Quadraped: A/P wt shifts and cat/cow stretch.  Standing: modified push ups at wall x 10  Issued energy conservation techniques and reviewed. Also discussed safety as pt wanted to order sharper different knife than recommended. Pt advised against this and reasons why, but also issued handout on cut resistant glove if pt pursues unsafe knife.                    OT Education - 03/01/21 [phone]     Education Details energy conservation techniques    Person(s) Educated Patient    Methods Explanation;Handout    Comprehension Verbalized understanding              OT Short Term Goals - 02/28/21 0832       OT SHORT TERM GOAL #1   Title Independent with coordination HEP for Rt hand    Time 4    Period Weeks    Status On-going      OT  SHORT TERM GOAL #2   Title Pt to verbalize understanding with potential A/E and DME needs greater safety, ease, and independence with ADLS (rocker knife, one handed cutting board, button hook, shower seat, etc)    Time 4    Period Weeks    Status On-going      OT SHORT TERM GOAL #3   Title Pt to verbalize understanding with energy conservation techniques and safety considerations with ADLS and driving including potential need for car adaptations and driving eval    Time 4    Period Weeks    Status New      OT SHORT TERM GOAL #4   Title Pt to verbalize understanding with potential assistive technology for typing and give contact info for local A.T. program    Time 4    Period Weeks    Status New               OT Long Term Goals - 02/14/21 0950       OT LONG TERM GOAL #1   Title Pt to improve  coordination Rt hand as evidenced by performing 9 hole peg test in under 60 sec    Baseline 70.07 sec    Time 8    Period Weeks    Status New      OT LONG TERM GOAL #2   Title Pt to report less overall drops Rt hand with bilateral tasks    Time 8    Period Weeks    Status New      OT LONG TERM GOAL #3   Title Pt to demo safety with cooking tasks using A/E and compensations prn    Time 8    Period Weeks    Status New      OT LONG TERM GOAL #4   Title Improve grip strength Rt hand by 10 lbs to assist in opening tight jars/containers    Baseline 58.6 lbs (Lt = 106.9)    Time 8    Period Weeks    Status New                   Plan - 03/01/21 [id number]     Clinical Impression Statement Pt improving in function and balance, although still demo occasional LOB. Pt impulsive with decreased judgement  d/t comorbidities. Pt requires redirection and cues to slow down.    OT Occupational Profile and History Detailed Assessment- Review of Records and additional review of physical, cognitive, psychosocial history related to current functional performance    Occupational performance deficits (Please refer to evaluation for details): ADL's;IADL's;Work;Leisure;Social Participation    Body Structure / Function / Physical Skills ADL;Strength;Decreased knowledge of use of DME;Tone;Dexterity;Balance;Body mechanics;Proprioception;UE functional use;IADL;ROM;Coordination;Mobility;FMC;Decreased knowledge of precautions    Cognitive Skills Attention;Safety Awareness;Problem Solve    Psychosocial Skills Interpersonal Interaction    Rehab Potential Fair   decreased awareness   Comorbidities Affecting Occupational Performance: Presence of comorbidities impacting occupational performance    Comorbidities impacting occupational performance description: Aspergers, bipolar    Modification or Assistance to Complete Evaluation  Min-Moderate modification of tasks or assist with assess necessary to complete eval     OT Frequency 2x / week    OT Duration 8 weeks   plus eval   OT Treatment/Interventions Self-care/ADL training;DME and/or AE instruction;Splinting;Therapeutic activities;Psychosocial skills training;Therapeutic exercise;Coping strategies training;Functional Mobility Training;Neuromuscular education;Passive range of motion;Visual/perceptual remediation/compensation;Patient/family education;Energy conservation;Manual Therapy    Plan continue scapula stabilization, functional reaching, and coordination RUE  Consulted and Agree with Plan of Care Patient             Patient will benefit from skilled therapeutic intervention in order to improve the following deficits and impairments:   Body Structure / Function / Physical Skills: ADL, Strength, Decreased knowledge of use of DME, Tone, Dexterity, Balance, Body mechanics, Proprioception, UE functional use, IADL, ROM, Coordination, Mobility, FMC, Decreased knowledge of precautions Cognitive Skills: Attention, Safety Awareness, Problem Solve Psychosocial Skills: Interpersonal Interaction   Visit Diagnosis: Other lack of coordination  Frontal lobe and executive function deficit    Problem List Patient Active Problem List   Diagnosis Date Noted   Slurred speech 02/26/2021   Autism 12/20/2020   Multiple sclerosis ([place]) 10/11/2020   MRI of brain abnormal 10/11/2020   Right sided weakness 10/11/2020   Gait disturbance 10/11/2020   Asperger syndrome 03/19/2013    Carey Bullocks, OTR/L 03/01/2021, 2:49 PM  [facility] [address] [place], [place], [address] Phone: [phone]   Fax:  [phone]  Name: ESTAL GELO MRN: [id number] Date of Birth: 02-Feb-1971

## 2021-03-02 DIAGNOSIS — E291 Testicular hypofunction: Secondary | ICD-10-CM | POA: Diagnosis not present

## 2021-03-08 ENCOUNTER — Ambulatory Visit: Payer: PPO | Attending: Internal Medicine | Admitting: Physical Therapy

## 2021-03-08 ENCOUNTER — Ambulatory Visit: Payer: PPO | Admitting: Occupational Therapy

## 2021-03-08 DIAGNOSIS — Z9181 History of falling: Secondary | ICD-10-CM | POA: Insufficient documentation

## 2021-03-08 DIAGNOSIS — R41844 Frontal lobe and executive function deficit: Secondary | ICD-10-CM | POA: Insufficient documentation

## 2021-03-08 DIAGNOSIS — R41842 Visuospatial deficit: Secondary | ICD-10-CM | POA: Insufficient documentation

## 2021-03-08 DIAGNOSIS — M6281 Muscle weakness (generalized): Secondary | ICD-10-CM | POA: Insufficient documentation

## 2021-03-08 DIAGNOSIS — R278 Other lack of coordination: Secondary | ICD-10-CM | POA: Insufficient documentation

## 2021-03-08 DIAGNOSIS — R2689 Other abnormalities of gait and mobility: Secondary | ICD-10-CM | POA: Insufficient documentation

## 2021-03-08 DIAGNOSIS — R41840 Attention and concentration deficit: Secondary | ICD-10-CM | POA: Insufficient documentation

## 2021-03-12 NOTE — Therapy (Signed)
[facility] [address] [place], [place], [address] Phone: [phone]   Fax:  [phone]  Patient Details  Name: George [name] MRN: [id number] Date of Birth: 07-03-1971 Referring Provider:  No ref. provider found  Encounter Date: 03/12/2021   Pt called to cancel all remaining appointments with occupational therapy. Will d/c episode of care at this time. Pt was seen for 4 total O.T. visits, last seen on 03/01/21.   Carey Bullocks, OTR/L 03/12/2021, 8:03 AM  [facility] [address] [place], [place], [address] Phone: [phone]   Fax:  [phone]

## 2021-03-13 ENCOUNTER — Ambulatory Visit: Payer: PPO

## 2021-03-13 ENCOUNTER — Encounter: Payer: PPO | Admitting: Occupational Therapy

## 2021-03-14 ENCOUNTER — Encounter (HOSPITAL_COMMUNITY): Payer: Self-pay

## 2021-03-14 ENCOUNTER — Other Ambulatory Visit: Payer: Self-pay

## 2021-03-14 ENCOUNTER — Ambulatory Visit: Payer: PPO | Admitting: Occupational Therapy

## 2021-03-14 ENCOUNTER — Ambulatory Visit: Payer: PPO

## 2021-03-14 NOTE — Progress Notes (Signed)
PCP - Dr. Lavone Orn Cardiologist -  EKG - 08/29/20 Chest x-ray -  ECHO -  Cardiac Cath -  CPAP -  ERAS Protcol - n/a  COVID TEST- n/a  Anesthesia review: n/a  -------------  SDW INSTRUCTIONS:  Your procedure is scheduled on Thursday 8/11. Please report to [facility] Main Entrance "A" at 0730 A.M., and check in at the Admitting office. Call this number if you have problems the morning of surgery: [phone]   Remember: Do not eat or drink after midnight the night before your surgery   Medications to take morning of surgery with a sip of water include: busPIRone (BUSPAR)  divalproex (DEPAKOTE)  SYNTHROID   As of today, STOP taking any Aspirin (unless otherwise instructed by your surgeon), Aleve, Naproxen, Ibuprofen, Motrin, Advil, Goody's, BC's, all herbal medications, fish oil, and all vitamins.    The Morning of Surgery Do not wear jewelry Do not wear lotions, powders, colognes, or deodorant Do not bring valuables to the hospital. [facility] is not responsible for any belongings or valuables.  If you are a smoker, DO NOT Smoke 24 hours prior to surgery  If you wear a CPAP at night please bring your mask the morning of surgery   Remember that you must have someone to transport you home after your surgery, and remain with you for 24 hours if you are discharged the same day.  Please bring cases for contacts, glasses, hearing aids, dentures or bridgework because it cannot be worn into surgery.   Patients discharged the day of surgery will not be allowed to drive home.   Please shower the NIGHT BEFORE/MORNING OF SURGERY (use antibacterial soap like DIAL soap if possible). Wear comfortable clothes the morning of surgery. Oral Hygiene is also important to reduce your risk of infection.  Remember - BRUSH YOUR TEETH THE MORNING OF SURGERY WITH YOUR REGULAR TOOTHPASTE  Patient denies shortness of breath, fever, cough and chest pain.

## 2021-03-15 ENCOUNTER — Encounter (HOSPITAL_COMMUNITY)
Admission: RE | Disposition: A | Discharge status: Home / Self Care | Payer: Self-pay | Source: Ambulatory Visit | Attending: Neurology

## 2021-03-15 ENCOUNTER — Encounter: Payer: PPO | Admitting: Occupational Therapy

## 2021-03-15 ENCOUNTER — Ambulatory Visit (HOSPITAL_COMMUNITY): Payer: PPO | Admitting: Certified Registered"

## 2021-03-15 ENCOUNTER — Ambulatory Visit (HOSPITAL_COMMUNITY): Payer: PPO

## 2021-03-15 ENCOUNTER — Ambulatory Visit (HOSPITAL_COMMUNITY)
Admission: RE | Admit: 2021-03-15 | Discharge: 2021-03-15 | Disposition: A | Discharge status: Home / Self Care | Payer: PPO | Source: Ambulatory Visit | Attending: Neurology | Admitting: Neurology

## 2021-03-15 ENCOUNTER — Other Ambulatory Visit: Payer: Self-pay

## 2021-03-15 ENCOUNTER — Encounter (HOSPITAL_COMMUNITY): Payer: Self-pay

## 2021-03-15 DIAGNOSIS — G35 Multiple sclerosis: Secondary | ICD-10-CM | POA: Diagnosis not present

## 2021-03-15 DIAGNOSIS — E039 Hypothyroidism, unspecified: Secondary | ICD-10-CM | POA: Diagnosis not present

## 2021-03-15 DIAGNOSIS — F845 Asperger's syndrome: Secondary | ICD-10-CM | POA: Insufficient documentation

## 2021-03-15 DIAGNOSIS — G959 Disease of spinal cord, unspecified: Secondary | ICD-10-CM | POA: Diagnosis not present

## 2021-03-15 DIAGNOSIS — K219 Gastro-esophageal reflux disease without esophagitis: Secondary | ICD-10-CM | POA: Diagnosis not present

## 2021-03-15 DIAGNOSIS — R269 Unspecified abnormalities of gait and mobility: Secondary | ICD-10-CM

## 2021-03-15 DIAGNOSIS — R4781 Slurred speech: Secondary | ICD-10-CM

## 2021-03-15 DIAGNOSIS — M4312 Spondylolisthesis, cervical region: Secondary | ICD-10-CM | POA: Diagnosis not present

## 2021-03-15 DIAGNOSIS — I10 Essential (primary) hypertension: Secondary | ICD-10-CM | POA: Diagnosis not present

## 2021-03-15 DIAGNOSIS — G9389 Other specified disorders of brain: Secondary | ICD-10-CM | POA: Diagnosis not present

## 2021-03-15 HISTORY — PX: RADIOLOGY WITH ANESTHESIA: SHX6223

## 2021-03-15 LAB — BASIC METABOLIC PANEL
Anion gap: 8 (ref 5–15)
BUN: 14 mg/dL (ref 6–20)
CO2: 26 mmol/L (ref 22–32)
Calcium: 9.2 mg/dL (ref 8.9–10.3)
Chloride: 104 mmol/L (ref 98–111)
Creatinine, Ser: 1.22 mg/dL (ref 0.61–1.24)
GFR, Estimated: >60 mL/min (ref >60)
Glucose, Bld: 87 mg/dL (ref 70–99)
Potassium: 4.2 mmol/L (ref 3.5–5.1)
Sodium: 138 mmol/L (ref 135–145)

## 2021-03-15 SURGERY — MRI WITH ANESTHESIA
Anesthesia: General

## 2021-03-15 MED ORDER — MIDAZOLAM HCL 2 MG/2ML IJ SOLN
INTRAMUSCULAR | Status: DC | PRN
  Administered 2021-03-15 (×2): 1 mg via INTRAVENOUS

## 2021-03-15 MED ORDER — CHLORHEXIDINE GLUCONATE 0.12 % MT SOLN
15.0000 mL | Freq: Once | OROMUCOSAL | Status: AC
  Administered 2021-03-15: 15 mL via OROMUCOSAL
  Filled 2021-03-15: qty 15

## 2021-03-15 MED ORDER — AMISULPRIDE (ANTIEMETIC) 5 MG/2ML IV SOLN: 10.0000 mg | Freq: Once | INTRAVENOUS | Status: DC | PRN

## 2021-03-15 MED ORDER — LACTATED RINGERS IV SOLN: INTRAVENOUS | Status: DC

## 2021-03-15 MED ORDER — PROPOFOL 10 MG/ML IV BOLUS
INTRAVENOUS | Status: DC | PRN
  Administered 2021-03-15 (×2): 100 mg via INTRAVENOUS
  Administered 2021-03-15: 200 mg via INTRAVENOUS

## 2021-03-15 MED ORDER — ORAL CARE MOUTH RINSE: 15.0000 mL | Freq: Once | OROMUCOSAL | Status: AC

## 2021-03-15 MED ORDER — ONDANSETRON HCL 4 MG/2ML IJ SOLN
INTRAMUSCULAR | Status: DC | PRN
  Administered 2021-03-15: 4 mg via INTRAVENOUS

## 2021-03-15 MED ORDER — GADOBUTROL 1 MMOL/ML IV SOLN
7.0000 mL | Freq: Once | INTRAVENOUS | Status: AC | PRN
  Administered 2021-03-15: 7 mL via INTRAVENOUS

## 2021-03-15 MED ORDER — LIDOCAINE 2% (20 MG/ML) 5 ML SYRINGE
INTRAMUSCULAR | Status: DC | PRN
  Administered 2021-03-15: 60 mg via INTRAVENOUS

## 2021-03-15 MED ORDER — DEXAMETHASONE SODIUM PHOSPHATE 10 MG/ML IJ SOLN
INTRAMUSCULAR | Status: DC | PRN
  Administered 2021-03-15: 10 mg via INTRAVENOUS

## 2021-03-15 NOTE — Anesthesia Preprocedure Evaluation (Signed)
Anesthesia Evaluation  Patient identified by MRN, date of birth, ID band Patient awake    Reviewed: Allergy & Precautions, NPO status , Patient's Chart, lab work & pertinent test results  History of Anesthesia Complications Negative for: history of anesthetic complications  Airway Mallampati: II  TM Distance: >3 FB Neck ROM: Full    Dental  (+) Dental Advisory Given   Pulmonary neg pulmonary ROS,    Pulmonary exam normal        Cardiovascular hypertension, Pt. on medications Normal cardiovascular exam     Neuro/Psych PSYCHIATRIC DISORDERS Anxiety Bipolar Disorder  Autism spectrum disorder negative neurological ROS     GI/Hepatic Neg liver ROS, GERD  Controlled, IBS    Endo/Other  Hypothyroidism   Renal/GU negative Renal ROS     Musculoskeletal negative musculoskeletal ROS (+)   Abdominal   Peds  Hematology negative hematology ROS (+)   Anesthesia Other Findings   Reproductive/Obstetrics                             Anesthesia Physical  Anesthesia Plan  ASA: II  Anesthesia Plan: General   Post-op Pain Management:    Induction: Intravenous  PONV Risk Score and Plan: 2 and Treatment may vary due to age or medical condition, Ondansetron and Midazolam  Airway Management Planned: LMA  Additional Equipment: None  Intra-op Plan:   Post-operative Plan: Extubation in OR  Informed Consent: I have reviewed the patients History and Physical, chart, labs and discussed the procedure including the risks, benefits and alternatives for the proposed anesthesia with the patient or authorized representative who has indicated his/her understanding and acceptance.     Dental advisory given and Consent reviewed with POA  Plan Discussed with: CRNA and Anesthesiologist  Anesthesia Plan Comments:         Anesthesia Quick Evaluation

## 2021-03-15 NOTE — Transfer of Care (Signed)
Immediate Anesthesia Transfer of Care Note  Patient: George [name]  Procedure(s) Performed: MRI BRAIN WITH AND WITHOUT, CERIVAL WITH AND WITHOUT  Patient Location: PACU  Anesthesia Type:General  Level of Consciousness: drowsy and patient cooperative  Airway & Oxygen Therapy: Patient Spontanous Breathing and Patient connected to nasal cannula oxygen  Post-op Assessment: Report given to RN, Post -op Vital signs reviewed and stable and Patient moving all extremities  Post vital signs: Reviewed and stable  Last Vitals:  Vitals Value Taken Time  BP 124/70 03/15/21 1237  Temp    Pulse 74 03/15/21 1240  Resp 22 03/15/21 1240  SpO2 100 % 03/15/21 1240  Vitals shown include unvalidated device data.  Last Pain:  Vitals:   03/15/21 1235  TempSrc:   PainSc: Asleep         Complications: No notable events documented.

## 2021-03-15 NOTE — Anesthesia Postprocedure Evaluation (Signed)
Anesthesia Post Note  Patient: George [name]  Procedure(s) Performed: MRI BRAIN WITH AND WITHOUT, CERIVAL WITH AND WITHOUT     Patient location during evaluation: PACU Anesthesia Type: General Level of consciousness: awake and alert Pain management: pain level controlled Vital Signs Assessment: post-procedure vital signs reviewed and stable Respiratory status: spontaneous breathing, nonlabored ventilation, respiratory function stable and patient connected to nasal cannula oxygen Cardiovascular status: blood pressure returned to baseline and stable Postop Assessment: no apparent nausea or vomiting Anesthetic complications: no   No notable events documented.  Last Vitals:  Vitals:   03/15/21 1305 03/15/21 1320  BP: 114/88 (!) 117/91  Pulse: 62 64  Resp: 16 15  Temp:  (!) 36.1 C  SpO2: 100% 100%    Last Pain:  Vitals:   03/15/21 1305  TempSrc:   PainSc: 0-No pain                 Kamillah Didonato L Tereka Thorley

## 2021-03-15 NOTE — Anesthesia Procedure Notes (Signed)
Procedure Name: LMA Insertion Date/Time: 03/15/2021 11:01 AM Performed by: Leonor Liv, CRNA Pre-anesthesia Checklist: Patient identified, Emergency Drugs available, Suction available and Patient being monitored Patient Re-evaluated:Patient Re-evaluated prior to induction Oxygen Delivery Method: Circle System Utilized Preoxygenation: Pre-oxygenation with 100% oxygen Induction Type: IV induction Ventilation: Mask ventilation without difficulty LMA: LMA inserted LMA Size: 5.0 Number of attempts: 1 Placement Confirmation: positive ETCO2 Tube secured with: Tape Dental Injury: Teeth and Oropharynx as per pre-operative assessment

## 2021-03-16 ENCOUNTER — Encounter (HOSPITAL_COMMUNITY): Payer: Self-pay | Admitting: Radiology

## 2021-03-19 ENCOUNTER — Telehealth: Payer: Self-pay | Admitting: Neurology

## 2021-03-19 NOTE — Telephone Encounter (Signed)
-----   Message from Britt Bottom, MD sent at 03/17/2021 10:43 AM EDT ----- Please let him know that I looked at the new MRI of the brain and cervical spine and compared it to his previous ones.  They showed the old MS lesions but there are no new lesions.

## 2021-03-19 NOTE — Telephone Encounter (Signed)
Called to advise the pt of the MRI findings on the brain and cervical spine. Informed the patient there wasn't evidence of new lesions, only the old lesions. Pt verbalized understanding. Pt had no questions at this time but was encouraged to call back if questions arise.

## 2021-03-20 ENCOUNTER — Encounter: Payer: PPO | Admitting: Occupational Therapy

## 2021-03-20 ENCOUNTER — Ambulatory Visit: Payer: PPO

## 2021-03-22 ENCOUNTER — Ambulatory Visit: Payer: PPO

## 2021-03-22 ENCOUNTER — Encounter: Payer: PPO | Admitting: Occupational Therapy

## 2021-03-26 ENCOUNTER — Encounter: Payer: PPO | Admitting: Occupational Therapy

## 2021-03-26 DIAGNOSIS — E291 Testicular hypofunction: Secondary | ICD-10-CM | POA: Diagnosis not present

## 2021-03-27 ENCOUNTER — Ambulatory Visit: Payer: PPO | Admitting: Diagnostic Neuroimaging

## 2021-03-27 ENCOUNTER — Ambulatory Visit: Payer: PPO

## 2021-03-27 ENCOUNTER — Encounter: Payer: PPO | Admitting: Occupational Therapy

## 2021-03-27 ENCOUNTER — Telehealth: Payer: Self-pay | Admitting: Neurology

## 2021-03-27 DIAGNOSIS — R269 Unspecified abnormalities of gait and mobility: Secondary | ICD-10-CM

## 2021-03-27 DIAGNOSIS — G35 Multiple sclerosis: Secondary | ICD-10-CM

## 2021-03-27 DIAGNOSIS — R531 Weakness: Secondary | ICD-10-CM

## 2021-03-27 DIAGNOSIS — R4781 Slurred speech: Secondary | ICD-10-CM

## 2021-03-27 NOTE — Telephone Encounter (Signed)
Pt called stating he went to the [facility] however he states he was not too keen on them would like to be referred somewhere else. Pt requesting a call back.

## 2021-03-27 NOTE — Telephone Encounter (Signed)
Called and spoke w/ pt. He would like to be referred to another location for PT/OT. Advised we will placed a new referral for him, he verbalized understanding.

## 2021-03-29 ENCOUNTER — Encounter: Payer: PPO | Admitting: Occupational Therapy

## 2021-03-29 ENCOUNTER — Ambulatory Visit: Payer: PPO

## 2021-04-02 ENCOUNTER — Ambulatory Visit: Payer: PPO | Admitting: Occupational Therapy

## 2021-04-02 ENCOUNTER — Ambulatory Visit: Payer: PPO | Attending: Neurology | Admitting: Rehabilitative and Restorative Service Providers"

## 2021-04-02 ENCOUNTER — Encounter: Payer: Self-pay | Admitting: Rehabilitative and Restorative Service Providers"

## 2021-04-02 ENCOUNTER — Other Ambulatory Visit: Payer: Self-pay

## 2021-04-02 DIAGNOSIS — R41840 Attention and concentration deficit: Secondary | ICD-10-CM | POA: Diagnosis not present

## 2021-04-02 DIAGNOSIS — R2681 Unsteadiness on feet: Secondary | ICD-10-CM | POA: Insufficient documentation

## 2021-04-02 DIAGNOSIS — R41844 Frontal lobe and executive function deficit: Secondary | ICD-10-CM | POA: Diagnosis not present

## 2021-04-02 DIAGNOSIS — R2689 Other abnormalities of gait and mobility: Secondary | ICD-10-CM

## 2021-04-02 DIAGNOSIS — M6281 Muscle weakness (generalized): Secondary | ICD-10-CM

## 2021-04-02 DIAGNOSIS — G35 Multiple sclerosis: Secondary | ICD-10-CM | POA: Insufficient documentation

## 2021-04-02 DIAGNOSIS — Z9181 History of falling: Secondary | ICD-10-CM | POA: Diagnosis not present

## 2021-04-02 DIAGNOSIS — R278 Other lack of coordination: Secondary | ICD-10-CM | POA: Insufficient documentation

## 2021-04-02 DIAGNOSIS — R41842 Visuospatial deficit: Secondary | ICD-10-CM | POA: Diagnosis not present

## 2021-04-02 NOTE — Therapy (Signed)
[facility]. [place], [place], [address] Phone: [phone]   Fax:  [phone]  Physical Therapy Evaluation  Patient Details  Name: George [name] MRN: [id number] Date of Birth: June 25, 1971 Referring Provider (PT): Lavone Orn, MD   Encounter Date: 04/02/2021   PT End of Session - 04/02/21 0927     Visit Number 1    Date for PT Re-Evaluation 05/25/21    Authorization Type [employer]    PT Start Time 0845    PT Stop Time 0925    PT Time Calculation (min) 40 min    Activity Tolerance Patient tolerated treatment well    Behavior During Therapy [facility] for tasks assessed/performed             Past Medical History:  Diagnosis Date   Anxiety    Asperger's syndrome    Autistic spectrum disorder    Asperger's (per H&P Olivia Clelland, PA)   Bipolar 1 disorder ([place])    "no mood swings in 20 years"  on medication   GERD (gastroesophageal reflux disease)    Head injury 2020   Hypertension    Hypothyroidism    IBS (irritable bowel syndrome)     Past Surgical History:  Procedure Laterality Date   NO PAST SURGERIES     RADIOLOGY WITH ANESTHESIA N/A 05/16/2020   Procedure: MRI BRAIN WITH AND WITHOUT CONTRAST;  Surgeon: Radiologist, Medication, MD;  Location: [facility];  Service: Radiology;  Laterality: N/A;   RADIOLOGY WITH ANESTHESIA N/A 08/29/2020   Procedure: MRI WITH ANESTHESIA CERVICAL SPINE WITH AND WITHOUT CONTRAST, THORACIC SPINE WITH AND WITHOUT CONTRAST;  Surgeon: Radiologist, Medication, MD;  Location: [facility];  Service: Radiology;  Laterality: N/A;   RADIOLOGY WITH ANESTHESIA N/A 03/15/2021   Procedure: MRI BRAIN WITH AND WITHOUT, CERIVAL WITH AND WITHOUT;  Surgeon: Radiologist, Medication, MD;  Location: [facility];  Service: Radiology;  Laterality: N/A;    There were no vitals filed for this visit.    Subjective Assessment - 04/02/21 0851     Subjective Pt reports that he is is having R sided  weakness and is having difficulty with walking.  He states that he has been going to the gym some since stopping PT at outpatient neuro clinic.  He states that he tries to do each machine about 3 sets of 10 at a low weight when he does the machines at the gym.  Pt reports that he was diagnosed with MS at 50 yo.  At 74, he noticed his first flair with symptoms of MS, at approximately the same time, he fell down approx 5 concrete stps and landed on his head.    Pertinent History h/o Asperger syndrome and bipolar disease    Limitations Standing;Walking;House hold activities    How long can you stand comfortably? ~1 hr he needs to sit down    Diagnostic tests MRI: A tiny T2 hyperintense lesion is questioned within the right  dorsolateral spinal cord at the C5 level (versus artifact). No other  cervical spinal cord signal abnormality is identified. No abnormal  cord enhancement.  Mild cervical spondylosis as described. No significant spinal canal  or foraminal stenosis.    Patient Stated Goals I would like to walk straight and look like a normal person.    Currently in Pain? Yes    Pain Score 2     Pain Location Coccyx    Pain Orientation Medial    Pain Descriptors / Indicators  Sore    Pain Type Acute pain    Pain Onset In the past 7 days    Pain Frequency Intermittent                [facility] PT Assessment - 04/02/21 0859       Assessment   Medical Diagnosis MS    Referring Provider (PT) Lavone Orn, MD    Hand Dominance Left    Next MD Visit Neurologist 06/20/21    Prior Therapy Yes      Precautions   Precautions Fall      Restrictions   Weight Bearing Restrictions No      Balance Screen   Has the patient fallen in the past 6 months Yes    How many times? at least 6    Has the patient had a decrease in activity level because of a fear of falling?  No    Is the patient reluctant to leave their home because of a fear of falling?  No      Home Environment   Living Environment  Private residence    Living Arrangements Alone    Type of [place] Access Stairs to enter    Entrance Stairs-Number of Steps 3    Entrance Stairs-Rails Can reach both    Home Layout One level    Home Equipment Grab bars - tub/shower      Prior Function   Level of Independence Independent    Vocation On disability    Vocation Requirements joined voc rehab    Leisure painting, music, gardening      AROM   Overall AROM Comments LE is [facility]      Strength   Right Hip Flexion 4-/5    Right Hip Extension 4+/5    Right Hip ABduction 4+/5    Left Hip Flexion 4+/5    Left Hip Extension 4+/5    Left Hip ABduction 4+/5    Right/Left Knee Right;Left    Right Knee Flexion 3+/5    Right Knee Extension 5/5    Left Knee Flexion 5/5    Left Knee Extension 5/5    Right/Left Ankle Right;Left    Right Ankle Dorsiflexion 4-/5    Right Ankle Plantar Flexion 3+/5    Left Ankle Dorsiflexion 5/5    Left Ankle Plantar Flexion 5/5      Standardized Balance Assessment   Standardized Balance Assessment Five Times Sit to Stand    Five times sit to stand comments  8.45 sec      Functional Gait  Assessment   Gait assessed  Yes    Gait Level Surface Walks 20 ft in less than 5.5 sec, no assistive devices, good speed, no evidence for imbalance, normal gait pattern, deviates no more than 6 in outside of the 12 in walkway width.    Change in Gait Speed Able to change speed, demonstrates mild gait deviations, deviates 6-10 in outside of the 12 in walkway width, or no gait deviations, unable to achieve a major change in velocity, or uses a change in velocity, or uses an assistive device.    Gait with Horizontal Head Turns Performs head turns smoothly with slight change in gait velocity (eg, minor disruption to smooth gait path), deviates 6-10 in outside 12 in walkway width, or uses an assistive device.    Gait with Vertical Head Turns Performs task with slight change in gait velocity (eg, minor  disruption to smooth gait  path), deviates 6 - 10 in outside 12 in walkway width or uses assistive device    Gait and Pivot Turn Pivot turns safely in greater than 3 sec and stops with no loss of balance, or pivot turns safely within 3 sec and stops with mild imbalance, requires small steps to catch balance.    Step Over Obstacle Is able to step over 2 stacked shoe boxes taped together (9 in total height) without changing gait speed. No evidence of imbalance.    Gait with Narrow Base of Support Ambulates 4-7 steps.    Gait with Eyes Closed Walks 20 ft, slow speed, abnormal gait pattern, evidence for imbalance, deviates 10-15 in outside 12 in walkway width. Requires more than 9 sec to ambulate 20 ft.    Ambulating Backwards Walks 20 ft, uses assistive device, slower speed, mild gait deviations, deviates 6-10 in outside 12 in walkway width.    Steps Alternating feet, must use rail.    Total Score 20                        Objective measurements completed on examination: See above findings.       [facility] Adult PT Treatment/Exercise - 04/02/21 0001       Ambulation/Gait   Ambulation Distance (Feet) 400 Feet    Assistive device None    Gait Pattern Step-through pattern;Decreased dorsiflexion - right;Decreased arm swing - right;Right genu recurvatum;Abducted- right;Poor foot clearance - right    Ambulation Surface Outdoor;Paved    Curb 5: Supervision      Knee/Hip Exercises: Aerobic   Nustep L5 x5 min      Knee/Hip Exercises: Standing   Other Standing Knee Exercises Alt toe taps to 4" step 2x10 with CGA and occassional UE support of stair rails                      PT Short Term Goals - 04/02/21 0940       PT SHORT TERM GOAL #1   Title Pt will be independent with initial HEP.    Time 2    Period Weeks    Status New               PT Long Term Goals - 04/02/21 0941       PT LONG TERM GOAL #1   Title Pt will be independent with progressing his  strengthening and balance HEP    Time 8    Period Weeks    Status New      PT LONG TERM GOAL #2   Title Pt will have improved FGA score to at least 24/30 to demo low fall risk    Time 8    Period Weeks    Status New      PT LONG TERM GOAL #3   Title Pt will be able to navigate a flight of stairs with reciprocal pattern with good safety and no loss of balance.    Time 8    Period Weeks    Status New      PT LONG TERM GOAL #4   Title Pt will be able to tolerate ambulating >1000' with no LOBs outdoors and indoors for safe community mobility    Time [place] - 04/02/21 [id number]  Clinical Impression Statement Pt is a 50 yo male who presents to outpatient PT with a diagnosis of MS and gait disturbance from Dr Lavone Orn.  He was working with outpatient PT at another clinic previously, but wanted to try a new environment for his therapy this session.  He presents with RLE weakness, decreased balance, abnormality of gait, and history of frequent falls.  He has been going to the gym to perform his HEP since discharge from PT.  Mr Zuloaga would benefit from skilled PT to address his functional impairments to allow him to reach his goals of ambulating and not falling.    Personal Factors and Comorbidities Comorbidity 1;Comorbidity 2;Age;Fitness;Time since onset of injury/illness/exacerbation    Comorbidities MS, Asperger's    Examination-Activity Limitations Locomotion Level;Squat;Carry;Stand    Examination-Participation Restrictions Community Activity;Yard Work;Volunteer;Shop    Stability/Clinical Decision Making Evolving/Moderate complexity    Clinical Decision Making Moderate    Rehab Potential Good    PT Frequency 1x / week    PT Duration 8 weeks    PT Treatment/Interventions ADLs/Self Care Home Management;Aquatic Therapy;DME Instruction;Gait training;Stair training;Functional mobility training;Therapeutic activities;Therapeutic  exercise;Balance training;Neuromuscular re-education;Patient/family education;Orthotic Fit/Training;Manual techniques;Passive range of motion;Energy conservation;Taping    PT Next Visit Plan Work on strengthening R quad, hamstring, and ankle. Work on balance on compliant surface and narrow BOS head turns or eyes closed.    Consulted and Agree with Plan of Care Patient             Patient will benefit from skilled therapeutic intervention in order to improve the following deficits and impairments:  Abnormal gait, Difficulty walking, Decreased balance, Decreased endurance, Decreased activity tolerance, Decreased mobility, Decreased strength, Impaired UE functional use  Visit Diagnosis: MS (multiple sclerosis) ([place]) - Plan: PT plan of care cert/re-cert  Unsteadiness on feet - Plan: PT plan of care cert/re-cert  Other abnormalities of gait and mobility - Plan: PT plan of care cert/re-cert  Muscle weakness (generalized) - Plan: PT plan of care cert/re-cert  Other lack of coordination - Plan: PT plan of care cert/re-cert     Problem List Patient Active Problem List   Diagnosis Date Noted   Slurred speech 02/26/2021   Autism 12/20/2020   Multiple sclerosis ([place]) 10/11/2020   MRI of brain abnormal 10/11/2020   Right sided weakness 10/11/2020   Gait disturbance 10/11/2020   Asperger syndrome 03/19/2013    Juel Burrow, PT, DPT 04/02/2021, 10:01 AM  [facility] [place], [place], [address] Phone: [phone]   Fax:  [phone]  Name: George [name] MRN: [id number] Date of Birth: [birth year]

## 2021-04-03 NOTE — Therapy (Signed)
[place]. [place], [place], [address] Phone: [phone]   Fax:  [phone]  Occupational Therapy Evaluation  Patient Details  Name: George [name] MRN: [id number] Date of Birth: 09/04/1970 Referring Provider (OT): Dr. Lavone Orn   Encounter Date: 04/02/2021   OT End of Session - 04/02/21 0812     Visit Number 1    Number of Visits 9    Date for OT Re-Evaluation 07/01/21    Authorization Type [employer]   $30 copay   OT Start Time 0800    OT Stop Time 0845    OT Time Calculation (min) 45 min    Activity Tolerance Patient tolerated treatment well    Behavior During Therapy [facility] for tasks assessed/performed            Past Medical History:  Diagnosis Date   Anxiety    Asperger's syndrome    Autistic spectrum disorder    Asperger's (per H&P Olivia Clelland, PA)   Bipolar 1 disorder ([place])    "no mood swings in 20 years"  on medication   GERD (gastroesophageal reflux disease)    Head injury 2020   Hypertension    Hypothyroidism    IBS (irritable bowel syndrome)     Past Surgical History:  Procedure Laterality Date   NO PAST SURGERIES     RADIOLOGY WITH ANESTHESIA N/A 05/16/2020   Procedure: MRI BRAIN WITH AND WITHOUT CONTRAST;  Surgeon: Radiologist, Medication, MD;  Location: [place];  Service: Radiology;  Laterality: N/A;   RADIOLOGY WITH ANESTHESIA N/A 08/29/2020   Procedure: MRI WITH ANESTHESIA CERVICAL SPINE WITH AND WITHOUT CONTRAST, THORACIC SPINE WITH AND WITHOUT CONTRAST;  Surgeon: Radiologist, Medication, MD;  Location: [place];  Service: Radiology;  Laterality: N/A;   RADIOLOGY WITH ANESTHESIA N/A 03/15/2021   Procedure: MRI BRAIN WITH AND WITHOUT, CERIVAL WITH AND WITHOUT;  Surgeon: Radiologist, Medication, MD;  Location: [place];  Service: Radiology;  Laterality: N/A;    There were no vitals filed for this visit.   Subjective Assessment - 04/02/21 0810     Subjective  Pt arrives  to session today w/ primary concerns regarding weakness and decreased coordination on R side. Pt states he received ST earlier this year and was receiving PT/OT at another OP site prior to transfer to this clinic. Pt also stated he fell recently and injured his tailbone, but states that it has slowly been improving w/ no add'l symptoms; pt inquired if OT was able to determine if it was fractured or not and OT recommended pt follow-up w/ PCP or seek add'l care regarding his concerns.    Pertinent History MS suspected in mid 30's, worsened in mid-2021; experienced increased difficulty w/ Rt hand coordination and Rt foot drop. PMH also includes ASD (Asperger's syndrome), biplolar disorder, anxiety, and HTN   Limitations High fall risk    Patient Stated Goals Improve balance/walking and R-sided coordination    Currently in Pain? No/denies             [facility] OT Assessment - 04/02/21 0814       Assessment   Medical Diagnosis MS    Referring Provider (OT) Arlice Colt, MD   Onset Date/Surgical Date --   diagnosed in mid 30's, but worsening symptoms beginning in [place] Left    Next MD Visit 06/20/21   Neurology    Prior Therapy Yes    PT/OT/ST  Precautions   Precautions Fall     Restrictions   Weight Bearing Restrictions No      Balance Screen   Has the patient fallen in the past 6 months Yes    How many times? Multiple   "I've been falling a lot lately"     Home  Environment   Living Arrangements Alone    Type of [place]    Entrance Stairs-Number of Steps [place] One level    Bathroom Building control surveyor   with grab bars   Additional Comments pt's parents live in [place]      Prior Function   Level of Independence Independent    Vocation On disability    Vocation Requirements joined voc rehab   waiting to see if it is covered by [employer], music, gardening      ADL   Eating/Feeding Modified  independent   self-purchased a knife for single-arm cutting of food   Grooming Modified independent   difficulty w/ bilateral tasks   Upper Body Bathing Modified independent    Lower Body Bathing Modified independent    Upper Body Dressing Increased time   difficulty w/ clothing manipulatives   Lower Body Dressing Increased time   difficulty w/ clothing manipulatives; has AE for tying shoes   Toilet Transfer Modified independent    Toileting - Clothing Manipulation Modified independent    [place] Transfer Modified independent   uses grab bars; decreased safety awareness   ADL comments Pt reports he has self-purchased a one-handed cutting board, but does not use it frequently     IADL   Shopping Shops independently for small purchases    Light Housekeeping Performs light daily tasks such as dishwashing, bed making;Does personal laundry completely    Meal Prep Plans, prepares and serves adequate meals independently   Per pt report   Programmer, applications own vehicle   Pt will benefit from continued monitoring of foundational driving skills for safety   Medication Management Is responsible for taking medication in correct dosages at correct time    Physiological scientist financial matters independently (budgets, writes checks, pays rent, bills goes to bank), collects and keeps track of income   Dad assists w/ finances     Mobility   Mobility Status Comments Pt ambulated into session w/out AD at close CGA; 2 LOB w/ ambulation     Vision - History   Baseline Vision Wears glasses only for reading    Additional Comments denies change   Vision Assessment    Comment To be further assessed in functional context     Cognition   Overall Cognitive Status Cognition to be further assessed in functional context PRN   Area of Impairment Safety/judgement;Awareness;Problem solving     Observation/Other Assessments   Observations Unsteadiness on  feet w/ ambulation; decreased coordination and eye-hand coordination w/ R hand > L     Sensation   Light Touch Appears Intact   in UE's     Coordination   Gross Motor Movements are Fluid and Coordinated No   Fine Motor Movements are Fluid and Coordinated No   Coordination and Movement Description Decreased smoothness; ROM and coordination LUE > RUE   9 Hole Peg Test Right;Left    Right 9 Hole Peg Test 1 min, 30 sec    Left 9 Hole Peg Test 25  sec    Coordination Unable to type w/ Rt hand, using only Lt hand at this time; reports difficulty manipulating xbox controller     ROM / Strength   AROM / PROM / Strength AROM;Strength      AROM   Overal AROM Within functional limits for tasks performed;   Overall AROM Comments BUE AROM WFL except R hand, w/ decreased finger extension and isolated movements due to tone/dystonia; RUE weakness and decreased control   AROM Assessment Site Shoulder    Right/Left Shoulder Right;Left    Right Shoulder Flexion 120 Degrees    Left Shoulder Flexion 140 Degrees      Strength   Overall Strength Within functional limits for tasks performed;   Strength Assessment Site Shoulder   Right/Left Shoulder Right;Left   Right Shoulder Flexion 4-/5   Right Shoulder Extension 3+/5   Right Shoulder ABduction 3+/5   Left Shoulder Flexion 4/5   Left Shoulder Extension 4/5   Left Shoulder ABduction 4-/5   Hand Function    Right Hand Grip (lbs) 39 lbs    Right Hand Lateral Pinch 22 lbs    Right Hand 3 Point Pinch 12 lbs    Left Hand Grip (lbs) 89 lbs    Left Hand Lateral Pinch 26 lbs    Left 3 point pinch 22 lbs               OT Education - 04/02/21 1427     Education Details Education provided on role and purpose of OT    Person(s) Educated Patient    Methods Explanation    Comprehension Verbalized understanding             OT Short Term Goals - 04/02/21 1721       OT SHORT TERM GOAL #1   Title Pt will demo independence w/ HEP designed for  BUE stabilization and strengthening    Baseline Currently has HEP designed for coordination    Time 4    Period Weeks    Status New    Target Date 05/03/21      OT SHORT TERM GOAL #2   Title Pt will verbalize understanding with potential AE/DME (button hook, shower seat, etc) to improve safety, ease, and independence with ADLs    Baseline Decreased knowledge of AE and DME options    Time 4    Period Weeks    Status New      OT SHORT TERM GOAL #3   Title Pt will verbalize understanding of fall prevention strategies, including AE prn, to improve safety w/ in-home mobility and decrease risk for continued falls    Baseline Pt reports history of multiple falls    Time 4    Period Weeks    Status New      OT SHORT TERM GOAL #4   Title Pt will verbalize understanding of potential assistive technology for typing, as well as provision of contact info for local A.T. program, if desired    Baseline Unable to type w/ BUEs at this time    Time 4    Period Weeks    Status New             OT Long Term Goals - 04/02/21 1729       OT LONG TERM GOAL #1   Title Pt will improve coordination w/ R hand to improve independence w/ clothing manipulatives as evidenced by improving 9-HPT time by at least 10 sec  Baseline 9-HPT in 90 sec w/ RUE    Time 8    Period Weeks    Status New    Target Date 06/01/21      OT LONG TERM GOAL #2   Title Pt will safely complete bilateral task w/ less than 4 drops    Baseline Reports frequently dropping objects    Time 8    Period Weeks    Status New      OT LONG TERM GOAL #3   Title Pt to demo safety with meal prep activity using compensatory techniques and AE prn    Baseline Mod I w/ meal prep, per pt report; decreased safety awareness    Time 8    Period Weeks    Status New      OT LONG TERM GOAL #4   Title Pt will improve RUE grip strength by at least 10 lbs to assist in opening tight jars/containers    Baseline 39# w/ RUE (89# w/ LUE)     Time 8    Period Weeks    Status New      OT LONG TERM GOAL #5   Title Pt will safely complete IADL task, including functional mobility and standing tolerance components prn, w/ Mod I by discharge    Baseline Unsteadiness on feet; frequent falls    Time 8    Period Weeks    Status New             Plan - 04/02/21 0813     Clinical Impression Statement Pt is a 50 y/o male who presents to OP OT due to MS w/ residual deficits in coordination and R-sided weakness. Pt currently lives alone and works as an Training and development officer. PMHx includes autism spectrum disorder (Asperger's syndrome, per chart review), bipolar disorder, anxiety, and HTN. Pt will benefit from skilled occupational therapy services to address coordination, ROM, balance, GM/FM control, safety awareness, introduction of compensatory strategies/AE prn, visual-perception, and implementation of an HEP to improve participation and safety during ADLs.   OT Occupational Profile and History Detailed Assessment- Review of Records and additional review of physical, cognitive, psychosocial history related to current functional performance    Occupational performance deficits (Please refer to evaluation for details): ADL's;IADL's;Work;Leisure;Social Participation    Body Structure / Function / Physical Skills ADL;Strength;Decreased knowledge of use of DME;Tone;Dexterity;Balance;Body mechanics;Proprioception;UE functional use;IADL;ROM;Coordination;Mobility;FMC;Decreased knowledge of precautions;Gait;GMC;Endurance    Cognitive Skills Attention;Safety Awareness;Problem Solve    Psychosocial Skills Interpersonal Interaction;Environmental  Adaptations    Rehab Potential Fair   Decreased awareness of deficits and reported min carryover of learned strategies   Clinical Decision Making Several treatment options, min-mod task modification necessary    Comorbidities Affecting Occupational Performance: Presence of comorbidities impacting occupational performance     Comorbidities impacting occupational performance description: ASD, bipolar disorder    Modification or Assistance to Complete Evaluation  Min-Moderate modification of tasks or assist with assess necessary to complete eval    OT Frequency 1x / week    OT Duration 8 weeks    OT Treatment/Interventions Self-care/ADL training;DME and/or AE instruction;Splinting;Therapeutic activities;Psychosocial skills training;Therapeutic exercise;Coping strategies training;Functional Mobility Training;Neuromuscular education;Passive range of motion;Visual/perceptual remediation/compensation;Patient/family education;Energy conservation;Manual Therapy;Balance training    Plan Scapula stabilization, closed-chain AROM, and coordination RUE    Recommended Other Services Currently receiving PT at this site    Consulted and Agree with Plan of Care Patient             Patient will benefit from skilled therapeutic intervention in  order to improve the following deficits and impairments:   Body Structure / Function / Physical Skills: ADL, Strength, Decreased knowledge of use of DME, Tone, Dexterity, Balance, Body mechanics, Proprioception, UE functional use, IADL, ROM, Coordination, Mobility, FMC, Decreased knowledge of precautions, Gait, GMC, Endurance Cognitive Skills: Attention, Safety Awareness, Problem Solve Psychosocial Skills: Interpersonal Interaction, Environmental  Adaptations   Visit Diagnosis: Other lack of coordination  Muscle weakness (generalized)  Other abnormalities of gait and mobility  History of falling  Attention and concentration deficit  Frontal lobe and executive function deficit  Visuospatial deficit    Problem List Patient Active Problem List   Diagnosis Date Noted   Slurred speech 02/26/2021   Autism 12/20/2020   Multiple sclerosis ([place]) 10/11/2020   MRI of brain abnormal 10/11/2020   Right sided weakness 10/11/2020   Gait disturbance 10/11/2020   Asperger syndrome  03/19/2013     Kathrine Cords, OTR/L, MSOT 04/03/2021, 3:42 PM  [place]. [place], [place], [address] Phone: [phone]   Fax:  [phone]  Name: KHALEEF STANDAGE MRN: [id number] Date of Birth: 02-Jun-1971

## 2021-04-07 NOTE — H&P (Signed)
50 year old man with multiple sclerosis and Asperger syndrome

## 2021-04-10 ENCOUNTER — Ambulatory Visit: Payer: PPO | Attending: Neurology | Admitting: Occupational Therapy

## 2021-04-10 ENCOUNTER — Encounter: Payer: Self-pay | Admitting: Occupational Therapy

## 2021-04-10 ENCOUNTER — Ambulatory Visit: Payer: PPO | Admitting: Physical Therapy

## 2021-04-10 ENCOUNTER — Other Ambulatory Visit: Payer: Self-pay

## 2021-04-10 ENCOUNTER — Encounter: Payer: Self-pay | Admitting: Physical Therapy

## 2021-04-10 DIAGNOSIS — Z9181 History of falling: Secondary | ICD-10-CM | POA: Insufficient documentation

## 2021-04-10 DIAGNOSIS — G35 Multiple sclerosis: Secondary | ICD-10-CM

## 2021-04-10 DIAGNOSIS — R278 Other lack of coordination: Secondary | ICD-10-CM | POA: Diagnosis not present

## 2021-04-10 DIAGNOSIS — R2681 Unsteadiness on feet: Secondary | ICD-10-CM | POA: Insufficient documentation

## 2021-04-10 DIAGNOSIS — M6281 Muscle weakness (generalized): Secondary | ICD-10-CM | POA: Diagnosis not present

## 2021-04-10 DIAGNOSIS — R41844 Frontal lobe and executive function deficit: Secondary | ICD-10-CM | POA: Insufficient documentation

## 2021-04-10 DIAGNOSIS — R2689 Other abnormalities of gait and mobility: Secondary | ICD-10-CM | POA: Diagnosis not present

## 2021-04-10 DIAGNOSIS — R41840 Attention and concentration deficit: Secondary | ICD-10-CM | POA: Insufficient documentation

## 2021-04-10 DIAGNOSIS — R41842 Visuospatial deficit: Secondary | ICD-10-CM | POA: Diagnosis not present

## 2021-04-10 DIAGNOSIS — G35D Multiple sclerosis, unspecified: Secondary | ICD-10-CM

## 2021-04-10 NOTE — Therapy (Signed)
[place]. [place], [place], [address] Phone: [phone]   Fax:  [phone]  Occupational Therapy Treatment  Patient Details  Name: George [name] MRN: [id number] Date of Birth: Dec 10, 1970 Referring Provider (OT): Arlice Colt, MD   Encounter Date: 04/10/2021   OT End of Session - 04/10/21 1105     Visit Number 2    Number of Visits 9    Date for OT Re-Evaluation 07/01/21    Authorization Type [employer]   $30 copay   OT Start Time 1100    OT Stop Time 1145    OT Time Calculation (min) 45 min    Activity Tolerance Patient tolerated treatment well    Behavior During Therapy [facility] for tasks assessed/performed            Past Medical History:  Diagnosis Date   Anxiety    Asperger's syndrome    Autistic spectrum disorder    Asperger's (per H&P Olivia Clelland, PA)   Bipolar 1 disorder ([place])    "no mood swings in 20 years"  on medication   GERD (gastroesophageal reflux disease)    Head injury 2020   Hypertension    Hypothyroidism    IBS (irritable bowel syndrome)     Past Surgical History:  Procedure Laterality Date   NO PAST SURGERIES     RADIOLOGY WITH ANESTHESIA N/A 05/16/2020   Procedure: MRI BRAIN WITH AND WITHOUT CONTRAST;  Surgeon: Radiologist, Medication, MD;  Location: [facility];  Service: Radiology;  Laterality: N/A;   RADIOLOGY WITH ANESTHESIA N/A 08/29/2020   Procedure: MRI WITH ANESTHESIA CERVICAL SPINE WITH AND WITHOUT CONTRAST, THORACIC SPINE WITH AND WITHOUT CONTRAST;  Surgeon: Radiologist, Medication, MD;  Location: [place];  Service: Radiology;  Laterality: N/A;   RADIOLOGY WITH ANESTHESIA N/A 03/15/2021   Procedure: MRI BRAIN WITH AND WITHOUT, CERIVAL WITH AND WITHOUT;  Surgeon: Radiologist, Medication, MD;  Location: [facility];  Service: Radiology;  Laterality: N/A;    There were no vitals filed for this visit.   Subjective Assessment - 04/10/21 1103     Subjective  Pt reports  he is very interested in working on improving his typing    Pertinent History MS suspected in mid 30's, worsened in mid-2021; experienced increased difficulty w/ Rt hand coordination and Rt foot drop. PMH also includes ASD (Asperger's syndrome), biplolar disorder, anxiety, and HTN    Limitations High fall risk    Patient Stated Goals Improve balance/walking and R-sided coordination    Currently in Pain? Yes    Pain Score 4     Pain Location Coccyx    Pain Orientation Medial    Pain Descriptors / Indicators Sore;Other (Comment)   "it just feels like a bruise"   Pain Type Acute pain    Pain Onset 1 to 4 weeks ago   Pt reports pain improves a little each day   Pain Frequency Intermittent             Treatment/Exercises - 04/10/21    Coordination Activities Pt completed coordination activities w/ RUE, including flipping cards off a deck, turning cards end-over-end in-hand, pushing cards off deck using thumb, and rotating a marker in-hand; OT also reviewed coordination activities previously included in HEP and emphasized importance of carryover to home    Pegboard Activity Copying pattern onto pegboard w/ easy-grip pegs w/ RUE to facilitate [place], hand strengthening, and eye-hand coordination; pt able to complete activity w/ 1 cue for spatial  orientation  Picking up marbles 2 at a time, translating each to fingertips, and placing on easy-grip pegs to facilitate in-hand manipulation, [place], and intrinsic hand strengthening. Completed w/ multiple drops and min verbal cues to isolate translation to fingertips; increased success with decreased speed. To return marbles to container, pt picked up off of pegs individually using 2-point pinch w/out difficulty         OT Education - 04/10/21 1620     Education Details Reviewed currently relevant AE w/ pt verbalizing understanding of use of equipment and safety considerations; further discussion regarding benefit of incorporating AE/DME recommendations  in conjunction w/ therapeutic activities and HEP to work toward goals and pt was receptive. Extensively reviewed fall prevention (handout provided), including fall risk and potential benefit of shower seat w/ further progression of balance deficits/unsteadiness on feet    Person(s) Educated Patient    Methods Explanation;Handout    Comprehension Verbalized understanding             OT Short Term Goals - 04/10/21 1106       OT SHORT TERM GOAL #1   Title Pt will demo independence w/ HEP designed for BUE stabilization and strengthening    Baseline Currently has HEP designed for coordination    Time 4    Period Weeks    Status On-going    Target Date 05/03/21      OT SHORT TERM GOAL #2   Title Pt will verbalize understanding with potential AE/DME (button hook, shower seat, etc) to improve safety, ease, and independence with ADLs    Baseline Decreased knowledge of AE and DME options    Time 4    Period Weeks    Status Achieved      OT SHORT TERM GOAL #3   Title Pt will verbalize understanding of fall prevention strategies, including AE prn, to improve safety w/ in-home mobility and decrease risk for continued falls    Baseline Pt reports history of multiple falls    Time 4    Period Weeks    Status On-going      OT SHORT TERM GOAL #4   Title Pt will verbalize understanding of potential assistive technology for typing, as well as provision of contact info for local A.T. program, if desired    Baseline Unable to type w/ BUEs at this time    Time 4    Period Weeks    Status On-going             OT Long Term Goals - 04/10/21 1117       OT LONG TERM GOAL #1   Title Pt will improve coordination w/ R hand to improve independence w/ clothing manipulatives as evidenced by improving 9-HPT time by at least 10 sec    Baseline 9-HPT in 90 sec w/ RUE    Time 8    Period Weeks    Status On-going      OT LONG TERM GOAL #2   Title Pt will safely complete bilateral task w/ less  than 4 drops    Baseline Reports frequently dropping objects    Time 8    Period Weeks    Status On-going      OT LONG TERM GOAL #3   Title Pt to demo safety with meal prep activity using compensatory techniques and AE prn    Baseline Mod I w/ meal prep, per pt report; decreased safety awareness    Time 8    Period  Weeks    Status On-going      OT LONG TERM GOAL #4   Title Pt will improve RUE grip strength by at least 10 lbs to assist in opening tight jars/containers    Baseline 39# w/ RUE (89# w/ LUE)    Time 8    Period Weeks    Status On-going      OT LONG TERM GOAL #5   Title Pt will safely complete IADL task, including functional mobility and standing tolerance components prn, w/ Mod I by discharge    Baseline Unsteadiness on feet; frequent falls    Time 8    Period Weeks    Status On-going             Plan - 04/10/21 1106     Clinical Impression Statement Considering pt's history of falls and frequent LOB w/ ambulation, OT discussed in-home fall prevention strategies (lighting, furniture layout, tripping hazards). OT also provided education regarding role of occupational therapy and benefit/intention of therapeutic interventions in working toward functional goals; pt was receptive. Coordination activities addressed/administered at pt's prior OP rehab clinic reviewed this session w/ introduction of add'l activities to improve [place] and modifications provided to encourage carryover to home.    OT Occupational Profile and History Detailed Assessment- Review of Records and additional review of physical, cognitive, psychosocial history related to current functional performance    Occupational performance deficits (Please refer to evaluation for details): ADL's;IADL's;Work;Leisure;Social Participation    Body Structure / Function / Physical Skills ADL;Strength;Decreased knowledge of use of DME;Tone;Dexterity;Balance;Body mechanics;Proprioception;UE functional  use;IADL;ROM;Coordination;Mobility;FMC;Decreased knowledge of precautions;Gait;GMC;Endurance    Cognitive Skills Attention;Safety Awareness;Problem Solve    Psychosocial Skills Interpersonal Interaction;Environmental  Adaptations    Rehab Potential Fair   Decreased awareness of deficits and reported min carryover of learned strategies   Clinical Decision Making Several treatment options, min-mod task modification necessary    Comorbidities Affecting Occupational Performance: Presence of comorbidities impacting occupational performance    Comorbidities impacting occupational performance description: ASD, bipolar disorder    Modification or Assistance to Complete Evaluation  Min-Moderate modification of tasks or assist with assess necessary to complete eval    OT Frequency 1x / week    OT Duration 8 weeks    OT Treatment/Interventions Self-care/ADL training;DME and/or AE instruction;Splinting;Therapeutic activities;Psychosocial skills training;Therapeutic exercise;Coping strategies training;Functional Mobility Training;Neuromuscular education;Passive range of motion;Visual/perceptual remediation/compensation;Patient/family education;Energy conservation;Manual Therapy;Balance training    Plan Review fall prevention strategies (STG3); scapula stabilization exercises and bilateral coordination activities    Recommended Other Services Currently receiving PT at this site    Consulted and Agree with Plan of Care Patient             Patient will benefit from skilled therapeutic intervention in order to improve the following deficits and impairments:   Body Structure / Function / Physical Skills: ADL, Strength, Decreased knowledge of use of DME, Tone, Dexterity, Balance, Body mechanics, Proprioception, UE functional use, IADL, ROM, Coordination, Mobility, FMC, Decreased knowledge of precautions, Gait, GMC, Endurance Cognitive Skills: Attention, Safety Awareness, Problem Solve Psychosocial Skills:  Interpersonal Interaction, Environmental  Adaptations   Visit Diagnosis: Other lack of coordination  Muscle weakness (generalized)  Other abnormalities of gait and mobility  History of falling  Attention and concentration deficit  Frontal lobe and executive function deficit  Visuospatial deficit    Problem List Patient Active Problem List   Diagnosis Date Noted   Slurred speech 02/26/2021   Autism 12/20/2020   Multiple sclerosis ([place]) 10/11/2020   MRI  of brain abnormal 10/11/2020   Right sided weakness 10/11/2020   Gait disturbance 10/11/2020   Asperger syndrome 03/19/2013     Kathrine Cords, OTR/L, MSOT  04/10/2021, 4:27 PM  [place]. [place], [place], [address] Phone: [phone]   Fax:  [phone]  Name: George [name] MRN: [id number] Date of Birth: [birth year]

## 2021-04-10 NOTE — Therapy (Signed)
[place]. [place], [place], [address] Phone: [phone]   Fax:  [phone]  Physical Therapy Treatment  Patient Details  Name: George [name] MRN: [id number] Date of Birth: 14-Aug-1970 Referring Provider (PT): Lavone Orn, MD   Encounter Date: 04/10/2021   PT End of Session - 04/10/21 1057     Visit Number 2    Number of Visits 6    Date for PT Re-Evaluation 05/25/21    Authorization Type [employer]    PT Start Time 1015    PT Stop Time 1055    PT Time Calculation (min) 40 min    Activity Tolerance Patient tolerated treatment well    Behavior During Therapy [facility] for tasks assessed/performed             Past Medical History:  Diagnosis Date   Anxiety    Asperger's syndrome    Autistic spectrum disorder    Asperger's (per H&P Olivia Clelland, PA)   Bipolar 1 disorder ([place])    "no mood swings in 20 years"  on medication   GERD (gastroesophageal reflux disease)    Head injury 2020   Hypertension    Hypothyroidism    IBS (irritable bowel syndrome)     Past Surgical History:  Procedure Laterality Date   NO PAST SURGERIES     RADIOLOGY WITH ANESTHESIA N/A 05/16/2020   Procedure: MRI BRAIN WITH AND WITHOUT CONTRAST;  Surgeon: Radiologist, Medication, MD;  Location: [place];  Service: Radiology;  Laterality: N/A;   RADIOLOGY WITH ANESTHESIA N/A 08/29/2020   Procedure: MRI WITH ANESTHESIA CERVICAL SPINE WITH AND WITHOUT CONTRAST, THORACIC SPINE WITH AND WITHOUT CONTRAST;  Surgeon: Radiologist, Medication, MD;  Location: [place];  Service: Radiology;  Laterality: N/A;   RADIOLOGY WITH ANESTHESIA N/A 03/15/2021   Procedure: MRI BRAIN WITH AND WITHOUT, CERIVAL WITH AND WITHOUT;  Surgeon: Radiologist, Medication, MD;  Location: [place];  Service: Radiology;  Laterality: N/A;    There were no vitals filed for this visit.   Subjective Assessment - 04/10/21 1019     Subjective Fell about 2.5 weeks ago  and fell on his back, now still having pain in his tailbone. Feels like its taking a while to heal. Worse with sitting and with STS transitions. Denies questions on HEP.    Pertinent History h/o Asperger syndrome and bipolar disease    Diagnostic tests MRI: A tiny T2 hyperintense lesion is questioned within the right  dorsolateral spinal cord at the C5 level (versus artifact). No other  cervical spinal cord signal abnormality is identified. No abnormal  cord enhancement.  Mild cervical spondylosis as described. No significant spinal canal  or foraminal stenosis.    Patient Stated Goals I would like to walk straight and look like a normal person.    Currently in Pain? Yes    Pain Score 3     Pain Location Coccyx    Pain Descriptors / Indicators Sore    Pain Type Acute pain                               [facility] Adult PT Treatment/Exercise - 04/10/21 0001       Knee/Hip Exercises: Aerobic   Recumbent Bike L2.5 x 6 min      Knee/Hip Exercises: Seated   Marching Strengthening;Both;1 set;20 reps;Weights    Marching Limitations 3#; sitting up tall 2x20   c/o coccyx  pain   Hamstring Curl Strengthening;Right;2 sets;10 reps    Hamstring Limitations green TB; 2x10    Sit to Sand 1 set;10 reps;without UE support   R foot back; PT assisting R LE in avoiding extensor thrust upon standing     Knee/Hip Exercises: Supine   Bridges Strengthening;Both;1 set;10 reps    Bridges with Clamshell Strengthening;Both;1 set;10 reps   red TB; cues to maintain slow controlled speed     Ankle Exercises: Seated   Toe Raise 10 reps   2x10 with red TB; 2nd set focusing on slow contraction lasting 2 sec     Ankle Exercises: Standing   Toe Raise 10 reps   limited R DF ROM                   PT Education - 04/10/21 1056     Education Details update to HEP; discussion on patient's current gym regimen and exercises to address quad weakness.    Person(s) Educated Patient    Methods  Explanation;Demonstration;Tactile cues;Verbal cues;Handout    Comprehension Verbalized understanding;Returned demonstration              PT Short Term Goals - 04/10/21 1201       PT SHORT TERM GOAL #1   Title Pt will be independent with initial HEP.    Time 2    Period Weeks    Status On-going               PT Long Term Goals - 04/10/21 1202       PT LONG TERM GOAL #1   Title Pt will be independent with progressing his strengthening and balance HEP    Time 8    Period Weeks    Status On-going      PT LONG TERM GOAL #2   Title Pt will have improved FGA score to at least 24/30 to demo low fall risk    Time 8    Period Weeks    Status On-going      PT LONG TERM GOAL #3   Title Pt will be able to navigate a flight of stairs with reciprocal pattern with good safety and no loss of balance.    Time 8    Period Weeks    Status On-going      PT LONG TERM GOAL #4   Title Pt will be able to tolerate ambulating >1000' with no LOBs outdoors and indoors for safe community mobility    Time 8    Period Weeks    Status On-going                   Plan - 04/10/21 1057     Clinical Impression Statement Patient without new complaints today. Still noting coccyx pain with prolonged sitting and STS transitions. Worked on core and LE strengthening with consistent cueing to improve muscle control and decrease speed. Patient reported HS cramping with bridges which was improved with cueing to isolate glutes. STS transfers were performed with R-LE bias. Patient initially with R extensor thrust upon standing and limited sitting upon sitting. Improved with cueing and manual assistance. Updated this exercise into HEP and encouraged patient to continue with current gym regimen to continue working on quad strength in order to improve knee stability. Patient reported understanding and without complaints at end of session.    Personal Factors and Comorbidities Comorbidity 1;Comorbidity  2;Age;Fitness;Time since onset of injury/illness/exacerbation    Comorbidities MS, Asperger's  Examination-Activity Limitations Locomotion Level;Squat;Carry;Stand    Examination-Participation Restrictions Community Activity;Yard Work;Volunteer;Shop    Stability/Clinical Decision Making Evolving/Moderate complexity    Rehab Potential Good    PT Frequency 1x / week    PT Duration 8 weeks    PT Treatment/Interventions ADLs/Self Care Home Management;Aquatic Therapy;DME Instruction;Gait training;Stair training;Functional mobility training;Therapeutic activities;Therapeutic exercise;Balance training;Neuromuscular re-education;Patient/family education;Orthotic Fit/Training;Manual techniques;Passive range of motion;Energy conservation;Taping    PT Next Visit Plan Work on strengthening R quad, hamstring, and ankle. Work on balance on compliant surface and narrow BOS head turns or eyes closed.    Consulted and Agree with Plan of Care Patient             Patient will benefit from skilled therapeutic intervention in order to improve the following deficits and impairments:  Abnormal gait, Difficulty walking, Decreased balance, Decreased endurance, Decreased activity tolerance, Decreased mobility, Decreased strength, Impaired UE functional use  Visit Diagnosis: MS (multiple sclerosis) (HCC)  Unsteadiness on feet  Muscle weakness (generalized)  Other abnormalities of gait and mobility  History of falling     Problem List Patient Active Problem List   Diagnosis Date Noted   Slurred speech 02/26/2021   Autism 12/20/2020   Multiple sclerosis ([place]) 10/11/2020   MRI of brain abnormal 10/11/2020   Right sided weakness 10/11/2020   Gait disturbance 10/11/2020   Asperger syndrome 03/19/2013    Janene Harvey, PT, DPT 04/10/21 12:06 PM    [address]. [place], [place], [address] Phone: [phone]   Fax:   [phone]  Name: George [name] MRN: [id number] Date of Birth: October 25, 1970

## 2021-04-16 DIAGNOSIS — E291 Testicular hypofunction: Secondary | ICD-10-CM | POA: Diagnosis not present

## 2021-04-17 ENCOUNTER — Ambulatory Visit: Payer: PPO | Admitting: Occupational Therapy

## 2021-04-17 ENCOUNTER — Ambulatory Visit: Payer: PPO | Admitting: Rehabilitative and Restorative Service Providers"

## 2021-04-17 ENCOUNTER — Encounter: Payer: Self-pay | Admitting: Occupational Therapy

## 2021-04-17 ENCOUNTER — Encounter: Payer: Self-pay | Admitting: Rehabilitative and Restorative Service Providers"

## 2021-04-17 ENCOUNTER — Other Ambulatory Visit: Payer: Self-pay

## 2021-04-17 DIAGNOSIS — R41844 Frontal lobe and executive function deficit: Secondary | ICD-10-CM

## 2021-04-17 DIAGNOSIS — R2689 Other abnormalities of gait and mobility: Secondary | ICD-10-CM

## 2021-04-17 DIAGNOSIS — R2681 Unsteadiness on feet: Secondary | ICD-10-CM

## 2021-04-17 DIAGNOSIS — G35 Multiple sclerosis: Secondary | ICD-10-CM

## 2021-04-17 DIAGNOSIS — R278 Other lack of coordination: Secondary | ICD-10-CM | POA: Diagnosis not present

## 2021-04-17 DIAGNOSIS — M6281 Muscle weakness (generalized): Secondary | ICD-10-CM

## 2021-04-17 DIAGNOSIS — Z9181 History of falling: Secondary | ICD-10-CM

## 2021-04-17 DIAGNOSIS — R41842 Visuospatial deficit: Secondary | ICD-10-CM

## 2021-04-17 DIAGNOSIS — R41840 Attention and concentration deficit: Secondary | ICD-10-CM

## 2021-04-17 DIAGNOSIS — G35D Multiple sclerosis, unspecified: Secondary | ICD-10-CM

## 2021-04-17 NOTE — Therapy (Signed)
[place]. [place], [place], [address] Phone: [phone]   Fax:  [phone]  Occupational Therapy Treatment  Patient Details  Name: George [name] MRN: [id number] Date of Birth: 05/10/1971 Referring Provider (OT): Arlice Colt, MD   Encounter Date: 04/17/2021   OT End of Session - 04/17/21 0806     Visit Number 3    Number of Visits 9    Date for OT Re-Evaluation 07/01/21    Authorization Type [employer]   $30 copay   OT Start Time 0801    OT Stop Time 0845    OT Time Calculation (min) 44 min    Activity Tolerance Patient tolerated treatment well    Behavior During Therapy [facility] for tasks assessed/performed            Past Medical History:  Diagnosis Date   Anxiety    Asperger's syndrome    Autistic spectrum disorder    Asperger's (per H&P Olivia Clelland, PA)   Bipolar 1 disorder ([place])    "no mood swings in 20 years"  on medication   GERD (gastroesophageal reflux disease)    Head injury 2020   Hypertension    Hypothyroidism    IBS (irritable bowel syndrome)     Past Surgical History:  Procedure Laterality Date   NO PAST SURGERIES     RADIOLOGY WITH ANESTHESIA N/A 05/16/2020   Procedure: MRI BRAIN WITH AND WITHOUT CONTRAST;  Surgeon: Radiologist, Medication, MD;  Location: [place];  Service: Radiology;  Laterality: N/A;   RADIOLOGY WITH ANESTHESIA N/A 08/29/2020   Procedure: MRI WITH ANESTHESIA CERVICAL SPINE WITH AND WITHOUT CONTRAST, THORACIC SPINE WITH AND WITHOUT CONTRAST;  Surgeon: Radiologist, Medication, MD;  Location: [place];  Service: Radiology;  Laterality: N/A;   RADIOLOGY WITH ANESTHESIA N/A 03/15/2021   Procedure: MRI BRAIN WITH AND WITHOUT, CERIVAL WITH AND WITHOUT;  Surgeon: Radiologist, Medication, MD;  Location: [place];  Service: Radiology;  Laterality: N/A;    There were no vitals filed for this visit.   Subjective Assessment - 04/17/21 0804     Subjective  Pt reports  he did practice some of the coordination activities at home and that he noticed after doing a few his "hand seemed to open up"    Pertinent History MS suspected in mid 30's, worsened in mid-2021; experienced increased difficulty w/ Rt hand coordination and Rt foot drop. PMH also includes ASD (Asperger's syndrome), biplolar disorder, anxiety, and HTN    Limitations High fall risk    Patient Stated Goals Improve balance/walking and R-sided coordination    Currently in Pain? No/denies             Treatment/Exercises - 04/17/21    Typing Pt completed online typing activity using both hands in 1st trial and LUE only in 2nd trial; pt able to complete activity at 9 WPM and 91% accuracy w/ both hands compared w/ 2nd trial at 28 WPM and 98% accuracy. OT attempted adaptive method of buddy taping R ring and little fingers and isolating R index finger w/ decreased success and pt exhibiting compensatory pattern at shoulder    Shoulder/Scapular Exercises Due to pt's report of completing exercise at home, OT facilitated closed-chain overhead press w/ 2# dowel for somatosensory input and increased equal activation. Pt reported "mild sharp" pain and activity was modified w/ pt completed BUE closed-chain flexion to 90 degrees w/ elbows extended; pt report no add'l pain during modified exercise and was instructed  to avoid overhead activities at home  Scapular retraction w/ arms at chest height and pt in seated position; tactile and verbal cues required to emphasize alignment and decreased R shoulder elevation         OT Education - 04/17/21 0908     Education Details Discussed potential for single-handed keyboard; updated HEP to include scapular exercises    Person(s) Educated Patient    Methods Explanation;Demonstration;Verbal cues;Handout    Comprehension Verbalized understanding;Returned demonstration             OT Short Term Goals - 04/10/21 1106       OT SHORT TERM GOAL #1   Title Pt will  demo independence w/ HEP designed for BUE stabilization and strengthening    Baseline Currently has HEP designed for coordination    Time 4    Period Weeks    Status On-going    Target Date 05/03/21      OT SHORT TERM GOAL #2   Title Pt will verbalize understanding with potential AE/DME (button hook, shower seat, etc) to improve safety, ease, and independence with ADLs    Baseline Decreased knowledge of AE and DME options    Time 4    Period Weeks    Status Achieved      OT SHORT TERM GOAL #3   Title Pt will verbalize understanding of fall prevention strategies, including AE prn, to improve safety w/ in-home mobility and decrease risk for continued falls    Baseline Pt reports history of multiple falls    Time 4    Period Weeks    Status On-going      OT SHORT TERM GOAL #4   Title Pt will verbalize understanding of potential assistive technology for typing, as well as provision of contact info for local A.T. program, if desired    Baseline Unable to type w/ BUEs at this time    Time 4    Period Weeks    Status On-going             OT Long Term Goals - 04/10/21 1117       OT LONG TERM GOAL #1   Title Pt will improve coordination w/ R hand to improve independence w/ clothing manipulatives as evidenced by improving 9-HPT time by at least 10 sec    Baseline 9-HPT in 90 sec w/ RUE    Time 8    Period Weeks    Status On-going      OT LONG TERM GOAL #2   Title Pt will safely complete bilateral task w/ less than 4 drops    Baseline Reports frequently dropping objects    Time 8    Period Weeks    Status On-going      OT LONG TERM GOAL #3   Title Pt to demo safety with meal prep activity using compensatory techniques and AE prn    Baseline Mod I w/ meal prep, per pt report; decreased safety awareness    Time 8    Period Weeks    Status On-going      OT LONG TERM GOAL #4   Title Pt will improve RUE grip strength by at least 10 lbs to assist in opening tight  jars/containers    Baseline 39# w/ RUE (89# w/ LUE)    Time 8    Period Weeks    Status On-going      OT LONG TERM GOAL #5   Title Pt will safely complete  IADL task, including functional mobility and standing tolerance components prn, w/ Mod I by discharge    Baseline Unsteadiness on feet; frequent falls    Time 8    Period Weeks    Status On-going             Plan - 04/17/21 0808     Clinical Impression Statement Due to pt's difficulty w/ finger extension and [place], pt experienced decreased success when typing w/ both hands despite adaptive strategies attempted. Pt also exhibited maladpative compensatory pattern at shoulder when attempting to incorporate R hand into typing activities. Considering this, pt will benefit from a single-handed keyboard and pt expressed signiticant interest in this; OT will attempt to facilitate administration and training of AT, including potential referral to local AT services prn. OT also introduced simple scapular stabilization and strengthening exercise that pt is able to safely complete at home w/out pain, w/ pt retuning demo of exercises w/ good understanding.    OT Occupational Profile and History Detailed Assessment- Review of Records and additional review of physical, cognitive, psychosocial history related to current functional performance    Occupational performance deficits (Please refer to evaluation for details): ADL's;IADL's;Work;Leisure;Social Participation    Body Structure / Function / Physical Skills ADL;Strength;Decreased knowledge of use of DME;Tone;Dexterity;Balance;Body mechanics;Proprioception;UE functional use;IADL;ROM;Coordination;Mobility;FMC;Decreased knowledge of precautions;Gait;GMC;Endurance    Cognitive Skills Attention;Safety Awareness;Problem Solve    Psychosocial Skills Interpersonal Interaction;Environmental  Adaptations    Rehab Potential Fair   Decreased awareness of deficits and reported min carryover of learned strategies    Clinical Decision Making Several treatment options, min-mod task modification necessary    Comorbidities Affecting Occupational Performance: Presence of comorbidities impacting occupational performance    Comorbidities impacting occupational performance description: ASD, bipolar disorder    Modification or Assistance to Complete Evaluation  Min-Moderate modification of tasks or assist with assess necessary to complete eval    OT Frequency 1x / week    OT Duration 8 weeks    OT Treatment/Interventions Self-care/ADL training;DME and/or AE instruction;Splinting;Therapeutic activities;Psychosocial skills training;Therapeutic exercise;Coping strategies training;Functional Mobility Training;Neuromuscular education;Passive range of motion;Visual/perceptual remediation/compensation;Patient/family education;Energy conservation;Manual Therapy;Balance training    Plan Review fall prevention strategies (STG3) and follow-up on adaptive keyboard; continue w/ scapula stabilization exercises that pt is able to safely complete at home (have pt return demo to ensure positioning and safety)    Recommended Other Services Currently receiving PT at this site    Consulted and Agree with Plan of Care Patient            Patient will benefit from skilled therapeutic intervention in order to improve the following deficits and impairments:   Body Structure / Function / Physical Skills: ADL, Strength, Decreased knowledge of use of DME, Tone, Dexterity, Balance, Body mechanics, Proprioception, UE functional use, IADL, ROM, Coordination, Mobility, FMC, Decreased knowledge of precautions, Gait, GMC, Endurance Cognitive Skills: Attention, Safety Awareness, Problem Solve Psychosocial Skills: Interpersonal Interaction, Environmental  Adaptations   Visit Diagnosis: Other lack of coordination  Muscle weakness (generalized)  Attention and concentration deficit  Frontal lobe and executive function deficit  Visuospatial  deficit  Other abnormalities of gait and mobility  History of falling   Problem List Patient Active Problem List   Diagnosis Date Noted   Slurred speech 02/26/2021   Autism 12/20/2020   Multiple sclerosis ([place]) 10/11/2020   MRI of brain abnormal 10/11/2020   Right sided weakness 10/11/2020   Gait disturbance 10/11/2020   Asperger syndrome 03/19/2013    Kathrine Cords, OTR/L 04/17/2021, 9:29  [place]. [place], [place], [address] Phone: [phone]   Fax:  [phone]  Name: George [name] MRN: [id number] Date of Birth: 04/11/1971

## 2021-04-17 NOTE — Therapy (Signed)
[place]. [place], [place], [address] Phone: [phone]   Fax:  [phone]  Physical Therapy Treatment  Patient Details  Name: George [name] MRN: [id number] Date of Birth: 1970/12/23 Referring Provider (PT): Lavone Orn, MD   Encounter Date: 04/17/2021   PT End of Session - 04/17/21 0849     Visit Number 3    Number of Visits 6    Date for PT Re-Evaluation 05/25/21    Authorization Type [employer]    PT Start Time 0845    PT Stop Time 0925    PT Time Calculation (min) 40 min    Activity Tolerance Patient tolerated treatment well    Behavior During Therapy [facility] for tasks assessed/performed             Past Medical History:  Diagnosis Date   Anxiety    Asperger's syndrome    Autistic spectrum disorder    Asperger's (per H&P Olivia Clelland, PA)   Bipolar 1 disorder ([place])    "no mood swings in 20 years"  on medication   GERD (gastroesophageal reflux disease)    Head injury 2020   Hypertension    Hypothyroidism    IBS (irritable bowel syndrome)     Past Surgical History:  Procedure Laterality Date   NO PAST SURGERIES     RADIOLOGY WITH ANESTHESIA N/A 05/16/2020   Procedure: MRI BRAIN WITH AND WITHOUT CONTRAST;  Surgeon: Radiologist, Medication, MD;  Location: [place];  Service: Radiology;  Laterality: N/A;   RADIOLOGY WITH ANESTHESIA N/A 08/29/2020   Procedure: MRI WITH ANESTHESIA CERVICAL SPINE WITH AND WITHOUT CONTRAST, THORACIC SPINE WITH AND WITHOUT CONTRAST;  Surgeon: Radiologist, Medication, MD;  Location: [place];  Service: Radiology;  Laterality: N/A;   RADIOLOGY WITH ANESTHESIA N/A 03/15/2021   Procedure: MRI BRAIN WITH AND WITHOUT, CERIVAL WITH AND WITHOUT;  Surgeon: Radiologist, Medication, MD;  Location: [place];  Service: Radiology;  Laterality: N/A;    There were no vitals filed for this visit.   Subjective Assessment - 04/17/21 0848     Subjective Pt reports that he feels  that his tailbone is feeling better.    Patient Stated Goals I would like to walk straight and look like a normal person.    Currently in Pain? Yes    Pain Score 2     Pain Location Coccyx                               [facility] Adult PT Treatment/Exercise - 04/17/21 0001       Knee/Hip Exercises: Stretches   Gastroc Stretch Both;2 reps;20 seconds      Knee/Hip Exercises: Aerobic   Recumbent Bike L5.0 x6 min      Knee/Hip Exercises: Machines for Strengthening   Cybex Knee Flexion 15# RLE only 2x10    Cybex Leg Press 60 # BIL 10 x 3 sets feet 3 way- cued for full ROM and speed   calf raises 2 sets 12.  both ext diffiuclty controlling left knee hyper ext     Knee/Hip Exercises: Standing   Heel Raises Both;2 sets;10 reps      Knee/Hip Exercises: Seated   Long Arc Quad Strengthening;Right;2 sets;10 reps   5# on machine cued for speed   Hamstring Curl Strengthening;Right;2 sets;10 reps    Hamstring Limitations red tband 2x10    Sit to Sand 1 set;10 reps  with yellow wt ball CP     Ankle Exercises: Machines for Strengthening   Cybex Leg Press --      Ankle Exercises: Seated   Toe Raise 20 reps   2x10 with red TB; 2nd set focusing on slow contraction lasting 2 sec                      PT Short Term Goals - 04/17/21 0950       PT SHORT TERM GOAL #1   Title Pt will be independent with initial HEP.    Status Achieved               PT Long Term Goals - 04/17/21 0951       PT LONG TERM GOAL #1   Title Pt will be independent with progressing his strengthening and balance HEP    Status On-going      PT LONG TERM GOAL #2   Title Pt will have improved FGA score to at least 24/30 to demo low fall risk    Status On-going      PT LONG TERM GOAL #3   Title Pt will be able to navigate a flight of stairs with reciprocal pattern with good safety and no loss of balance.    Status On-going      PT LONG TERM GOAL #4   Title Pt will be able to  tolerate ambulating >1000' with no LOBs outdoors and indoors for safe community mobility    Status On-going                   Plan - 04/17/21 0948     Clinical Impression Statement Mr Wiland continues to progress towards goal related activities.  He is improving with decreased coccyx pain to allow him to perform more functional sit to/from stand.  He continues to require cuing for decreased speed/improved control to focus with improved eccentric muscle control.  He tolerated addition of machines without complaint.  Focus to continue to strengthen quad strength and control for improved stability and decreased risk of falling.    PT Treatment/Interventions ADLs/Self Care Home Management;Aquatic Therapy;DME Instruction;Gait training;Stair training;Functional mobility training;Therapeutic activities;Therapeutic exercise;Balance training;Neuromuscular re-education;Patient/family education;Orthotic Fit/Training;Manual techniques;Passive range of motion;Energy conservation;Taping    PT Next Visit Plan Work on strengthening R quad, hamstring, and ankle. Work on balance on compliant surface and narrow BOS head turns or eyes closed.    Consulted and Agree with Plan of Care Patient             Patient will benefit from skilled therapeutic intervention in order to improve the following deficits and impairments:  Abnormal gait, Difficulty walking, Decreased balance, Decreased endurance, Decreased activity tolerance, Decreased mobility, Decreased strength, Impaired UE functional use  Visit Diagnosis: Other lack of coordination  Muscle weakness (generalized)  Other abnormalities of gait and mobility  History of falling  MS (multiple sclerosis) (HCC)  Unsteadiness on feet     Problem List Patient Active Problem List   Diagnosis Date Noted   Slurred speech 02/26/2021   Autism 12/20/2020   Multiple sclerosis ([place]) 10/11/2020   MRI of brain abnormal 10/11/2020   Right sided weakness  10/11/2020   Gait disturbance 10/11/2020   Asperger syndrome 03/19/2013    Juel Burrow, PT, DPT 04/17/2021, 9:52 AM  [place]. [place], [place], [address] Phone: [phone]   Fax:  [phone]  Name: CHANE LUNN MRN:  [id number] Date of Birth: 26-Feb-1971

## 2021-04-18 ENCOUNTER — Telehealth: Payer: Self-pay | Admitting: Speech Pathology

## 2021-04-18 NOTE — Telephone Encounter (Signed)
Created in error

## 2021-04-24 ENCOUNTER — Other Ambulatory Visit: Payer: Self-pay

## 2021-04-24 ENCOUNTER — Encounter: Payer: Self-pay | Admitting: Occupational Therapy

## 2021-04-24 ENCOUNTER — Ambulatory Visit: Payer: PPO | Admitting: Physical Therapy

## 2021-04-24 ENCOUNTER — Ambulatory Visit: Payer: PPO | Admitting: Occupational Therapy

## 2021-04-24 ENCOUNTER — Encounter: Payer: Self-pay | Admitting: Physical Therapy

## 2021-04-24 DIAGNOSIS — R2689 Other abnormalities of gait and mobility: Secondary | ICD-10-CM

## 2021-04-24 DIAGNOSIS — G35 Multiple sclerosis: Secondary | ICD-10-CM

## 2021-04-24 DIAGNOSIS — R278 Other lack of coordination: Secondary | ICD-10-CM

## 2021-04-24 DIAGNOSIS — Z9181 History of falling: Secondary | ICD-10-CM

## 2021-04-24 DIAGNOSIS — M6281 Muscle weakness (generalized): Secondary | ICD-10-CM

## 2021-04-24 DIAGNOSIS — G35D Multiple sclerosis, unspecified: Secondary | ICD-10-CM

## 2021-04-24 DIAGNOSIS — R2681 Unsteadiness on feet: Secondary | ICD-10-CM

## 2021-04-24 NOTE — Therapy (Signed)
[place]. [place], [place], [address] Phone: [phone]   Fax:  [phone]  Physical Therapy Treatment  Patient Details  Name: George [name] MRN: [id number] Date of Birth: [birth year] Referring Provider (PT): Lavone Orn, MD   Encounter Date: 04/24/2021   PT End of Session - 04/24/21 0926     Visit Number 4    Number of Visits 6    Date for PT Re-Evaluation 05/25/21    Authorization Type [employer]    PT Start Time 0849    PT Stop Time 0925    PT Time Calculation (min) 36 min    Equipment Utilized During Treatment Gait belt    Activity Tolerance Patient tolerated treatment well;Patient limited by fatigue    Behavior During Therapy WFL for tasks assessed/performed             Past Medical History:  Diagnosis Date   Anxiety    Asperger's syndrome    Autistic spectrum disorder    Asperger's (per H&P Olivia Clelland, PA)   Bipolar 1 disorder ([place])    "no mood swings in 20 years"  on medication   GERD (gastroesophageal reflux disease)    Head injury 2020   Hypertension    Hypothyroidism    IBS (irritable bowel syndrome)     Past Surgical History:  Procedure Laterality Date   NO PAST SURGERIES     RADIOLOGY WITH ANESTHESIA N/A 05/16/2020   Procedure: MRI BRAIN WITH AND WITHOUT CONTRAST;  Surgeon: Radiologist, Medication, MD;  Location: [facility];  Service: Radiology;  Laterality: N/A;   RADIOLOGY WITH ANESTHESIA N/A 08/29/2020   Procedure: MRI WITH ANESTHESIA CERVICAL SPINE WITH AND WITHOUT CONTRAST, THORACIC SPINE WITH AND WITHOUT CONTRAST;  Surgeon: Radiologist, Medication, MD;  Location: [facility];  Service: Radiology;  Laterality: N/A;   RADIOLOGY WITH ANESTHESIA N/A 03/15/2021   Procedure: MRI BRAIN WITH AND WITHOUT, CERIVAL WITH AND WITHOUT;  Surgeon: Radiologist, Medication, MD;  Location: [facility];  Service: Radiology;  Laterality: N/A;    There were no vitals filed for this visit.    Subjective Assessment - 04/24/21 0846     Subjective Feeling a little nauseous. "My rump is about 90% better now." Had a near fall when getting out of bed but caught himself on the nightstand.    Pertinent History h/o Asperger syndrome and bipolar disease    Diagnostic tests MRI: A tiny T2 hyperintense lesion is questioned within the right  dorsolateral spinal cord at the C5 level (versus artifact). No other  cervical spinal cord signal abnormality is identified. No abnormal  cord enhancement.  Mild cervical spondylosis as described. No significant spinal canal  or foraminal stenosis.    Patient Stated Goals I would like to walk straight and look like a normal person.    Currently in Pain? Yes    Pain Score 2     Pain Location Coccyx    Pain Descriptors / Indicators Sharp                               [facility] Adult PT Treatment/Exercise - 04/24/21 0850       Neuro Re-ed    Neuro Re-ed Details  romberg, romberg with EC, R/L tandem balance 30" each; walking on toes with min A- considerable imbalance.   most imbalance in tandem with R LOB     Knee/Hip Exercises: Aerobic  Recumbent Bike L6.0 x6 min   last min on L4     Knee/Hip Exercises: Standing   Terminal Knee Extension Strengthening;Right;1 set;10 reps;Theraband    Theraband Level (Terminal Knee Extension) Level 3 (Green)    Terminal Knee Extension Limitations 10x3" holding onto ski poles    Forward Step Up Right;1 set;10 reps;Hand Hold: 2;Step Height: 4"   manual assistance to avoid locking R knee     Knee/Hip Exercises: Seated   Other Seated Knee/Hip Exercises R ankle DF with red TB x15   focusing on eccentric control   Sit to Sand without UE support;1 set   improved knee extension control                    PT Education - 04/24/21 0926     Education Details update to HEP; edu on benefits of AD when patient gets fatigued such as walking stick    Person(s) Educated Patient    Methods  Explanation;Demonstration;Tactile cues;Verbal cues;Handout    Comprehension Verbalized understanding;Returned demonstration              PT Short Term Goals - 04/17/21 0950       PT SHORT TERM GOAL #1   Title Pt will be independent with initial HEP.    Status Achieved               PT Long Term Goals - 04/17/21 0951       PT LONG TERM GOAL #1   Title Pt will be independent with progressing his strengthening and balance HEP    Status On-going      PT LONG TERM GOAL #2   Title Pt will have improved FGA score to at least 24/30 to demo low fall risk    Status On-going      PT LONG TERM GOAL #3   Title Pt will be able to navigate a flight of stairs with reciprocal pattern with good safety and no loss of balance.    Status On-going      PT LONG TERM GOAL #4   Title Pt will be able to tolerate ambulating >1000' with no LOBs outdoors and indoors for safe community mobility    Status On-going                   Plan - 04/24/21 0927     Clinical Impression Statement Patient reporting some nausea this AM d/t IBS. Continues to note improved coccyx pain. Also reports a near fall since last session, but was able to catch himself on the nightstand. Worked on STS and resisted TKE with focus on R knee extension control. Patient was able to demonstrate decreased extensor thrust and improved control. Step ups were performed with manual assistance to avoid locking/encourage unlocking of the R knee. Standing balane activities focused on narrow BOS, with patient demonstrating quick fatigue and imbalance. Ended session early d/t fatigue. Patient without other complaints at end of session.    Comorbidities MS, Asperger's    PT Treatment/Interventions ADLs/Self Care Home Management;Aquatic Therapy;DME Instruction;Gait training;Stair training;Functional mobility training;Therapeutic activities;Therapeutic exercise;Balance training;Neuromuscular re-education;Patient/family  education;Orthotic Fit/Training;Manual techniques;Passive range of motion;Energy conservation;Taping    PT Next Visit Plan Work on strengthening R quad, hamstring, and ankle. Work on balance on compliant surface and narrow BOS head turns or eyes closed.    Consulted and Agree with Plan of Care Patient             Patient will benefit from skilled  therapeutic intervention in order to improve the following deficits and impairments:  Abnormal gait, Difficulty walking, Decreased balance, Decreased endurance, Decreased activity tolerance, Decreased mobility, Decreased strength, Impaired UE functional use  Visit Diagnosis: Muscle weakness (generalized)  Other abnormalities of gait and mobility  MS (multiple sclerosis) ([place])  History of falling  Unsteadiness on feet     Problem List Patient Active Problem List   Diagnosis Date Noted   Slurred speech 02/26/2021   Autism 12/20/2020   Multiple sclerosis ([place]) 10/11/2020   MRI of brain abnormal 10/11/2020   Right sided weakness 10/11/2020   Gait disturbance 10/11/2020   Asperger syndrome 03/19/2013      Janene Harvey, PT, DPT 04/24/21 9:36 AM   [facility]. [place], [place], [address] Phone: [phone]   Fax:  [phone]  Name: George [name] MRN: [id number] Date of Birth: [birth year]

## 2021-04-24 NOTE — Therapy (Signed)
[facility]. [place], [place], [address] Phone: [phone]   Fax:  [phone]  Occupational Therapy Treatment  Patient Details  Name: George [name] MRN: [id number] Date of Birth: 04-02-1971 Referring Provider (OT): Arlice Colt, MD   Encounter Date: 04/24/2021   OT End of Session - 04/24/21 0826     Visit Number 4    Number of Visits 9    Date for OT Re-Evaluation 07/01/21    Authorization Type [employer]   $30 copay   OT Start Time 0800    OT Stop Time 0844    OT Time Calculation (min) 44 min    Activity Tolerance Patient tolerated treatment well    Behavior During Therapy [facility] for tasks assessed/performed            Past Medical History:  Diagnosis Date   Anxiety    Asperger's syndrome    Autistic spectrum disorder    Asperger's (per H&P Olivia Clelland, PA)   Bipolar 1 disorder ([place])    "no mood swings in 20 years"  on medication   GERD (gastroesophageal reflux disease)    Head injury 2020   Hypertension    Hypothyroidism    IBS (irritable bowel syndrome)     Past Surgical History:  Procedure Laterality Date   NO PAST SURGERIES     RADIOLOGY WITH ANESTHESIA N/A 05/16/2020   Procedure: MRI BRAIN WITH AND WITHOUT CONTRAST;  Surgeon: Radiologist, Medication, MD;  Location: [facility];  Service: Radiology;  Laterality: N/A;   RADIOLOGY WITH ANESTHESIA N/A 08/29/2020   Procedure: MRI WITH ANESTHESIA CERVICAL SPINE WITH AND WITHOUT CONTRAST, THORACIC SPINE WITH AND WITHOUT CONTRAST;  Surgeon: Radiologist, Medication, MD;  Location: [facility];  Service: Radiology;  Laterality: N/A;   RADIOLOGY WITH ANESTHESIA N/A 03/15/2021   Procedure: MRI BRAIN WITH AND WITHOUT, CERIVAL WITH AND WITHOUT;  Surgeon: Radiologist, Medication, MD;  Location: [facility];  Service: Radiology;  Laterality: N/A;    There were no vitals filed for this visit.   Subjective Assessment - 04/24/21 0822     Subjective  Pt reports  he had a fall about 4 days ago when he was trying to get out of his bed; states he did not hit his head    Pertinent History MS suspected in mid 30's, worsened in mid-2021; experienced increased difficulty w/ Rt hand coordination and Rt foot drop. PMH also includes ASD (Asperger's syndrome), biplolar disorder, anxiety, and HTN    Limitations High fall risk    Patient Stated Goals Improve balance/walking and R-sided coordination    Currently in Pain? Yes    Pain Score 2     Pain Location Coccyx    Pain Orientation Medial    Pain Descriptors / Indicators Sharp   With movement            Treatment/Exercises - 04/24/21    [facility] Alternating R index and long fingers up finger ladder to facilitate dexterity, coordination, and finger extension w/ RUE control and light strengthening against gravity. OT modified activity to complete walking forward/back along RLE and pushing small ball forward/back to decrease demand on GMC of RUE w/ positive results    AROM Full grasp and release of R hand, emphasizing finger extension by holding position at end range for 2 sec; completed 10x w/ pt demonstrating increased fatigue w/ increased reps  MCP extension attempted w/ all digits and isolated index, long, and ring fingers of R hand;  pt able to achieve fair extension w/ index finger that decreased w/ repetition and poor long/ring finger extension. Exercises completed w/ pt's palm and forearm resting on tabletop         OT Education - 04/24/21 0826     Education Details Reviewed fall prevention strategies in depth (emphasizing pacing, using railings on stairs, lighting, and steadying balance before walking). Discussed strategies to increase safety when carrying groceries up stairs w/ handout for potential AE provided    Person(s) Educated Patient    Methods Explanation;Handout    Comprehension Verbalized understanding             OT Short Term Goals - 04/24/21 0829       OT SHORT TERM GOAL #1    Title Pt will demo independence w/ HEP designed for BUE stabilization and strengthening    Baseline Currently has HEP designed for coordination    Time 4    Period Weeks    Status On-going    Target Date 05/03/21      OT SHORT TERM GOAL #2   Title Pt will verbalize understanding with potential AE/DME (button hook, shower seat, etc) to improve safety, ease, and independence with ADLs    Baseline Decreased knowledge of AE and DME options    Time 4    Period Weeks    Status Achieved      OT SHORT TERM GOAL #3   Title Pt will verbalize understanding of fall prevention strategies, including AE prn, to improve safety w/ in-home mobility and decrease risk for continued falls    Baseline Pt reports history of multiple falls    Time 4    Period Weeks    Status Partially Met   04/24/21     OT SHORT TERM GOAL #4   Title Pt will verbalize understanding of potential assistive technology for typing, as well as provision of contact info for local A.T. program, if desired    Baseline Unable to type w/ BUEs at this time    Time 4    Period Weeks    Status On-going             OT Long Term Goals - 04/10/21 1117       OT LONG TERM GOAL #1   Title Pt will improve coordination w/ R hand to improve independence w/ clothing manipulatives as evidenced by improving 9-HPT time by at least 10 sec    Baseline 9-HPT in 90 sec w/ RUE    Time 8    Period Weeks    Status On-going      OT LONG TERM GOAL #2   Title Pt will safely complete bilateral task w/ less than 4 drops    Baseline Reports frequently dropping objects    Time 8    Period Weeks    Status On-going      OT LONG TERM GOAL #3   Title Pt to demo safety with meal prep activity using compensatory techniques and AE prn    Baseline Mod I w/ meal prep, per pt report; decreased safety awareness    Time 8    Period Weeks    Status On-going      OT LONG TERM GOAL #4   Title Pt will improve RUE grip strength by at least 10 lbs to assist  in opening tight jars/containers    Baseline 39# w/ RUE (89# w/ LUE)    Time 8    Period Weeks  Status On-going      OT LONG TERM GOAL #5   Title Pt will safely complete IADL task, including functional mobility and standing tolerance components prn, w/ Mod I by discharge    Baseline Unsteadiness on feet; frequent falls    Time 8    Period Weeks    Status On-going             Plan - 04/24/21 0925     Clinical Impression Statement Considering pt's report of additional fall over the weekend, OT reviewed fall prevention strategies, emphasizing importance of benefits of compensatory and safety strategies vs. increased risk and potential impacts of falling. Pt is resistant to AD for ambulation, but was able to verbalize understanding of fall prevention strategies and demonstrate understanding of pacing during sit-to-stand transitions throughout session. OT also continued to facilitate [facility] and finger extension, demonstrating AROM and coordination activities pt is able to complete at home. Due to difficulty w/ distal control, OT modififed activity to adjust positioning of R shoulder for improved proximal alignment and stability.    OT Occupational Profile and History Detailed Assessment- Review of Records and additional review of physical, cognitive, psychosocial history related to current functional performance    Occupational performance deficits (Please refer to evaluation for details): ADL's;IADL's;Work;Leisure;Social Participation    Body Structure / Function / Physical Skills ADL;Strength;Decreased knowledge of use of DME;Tone;Dexterity;Balance;Body mechanics;Proprioception;UE functional use;IADL;ROM;Coordination;Mobility;FMC;Decreased knowledge of precautions;Gait;GMC;Endurance    Cognitive Skills Attention;Safety Awareness;Problem Solve    Psychosocial Skills Interpersonal Interaction;Environmental  Adaptations    Rehab Potential Fair   Decreased awareness of deficits and reported min  carryover of learned strategies   Clinical Decision Making Several treatment options, min-mod task modification necessary    Comorbidities Affecting Occupational Performance: Presence of comorbidities impacting occupational performance    Comorbidities impacting occupational performance description: ASD, bipolar disorder    Modification or Assistance to Complete Evaluation  Min-Moderate modification of tasks or assist with assess necessary to complete eval    OT Frequency 1x / week    OT Duration 8 weeks    OT Treatment/Interventions Self-care/ADL training;DME and/or AE instruction;Splinting;Therapeutic activities;Psychosocial skills training;Therapeutic exercise;Coping strategies training;Functional Mobility Training;Neuromuscular education;Passive range of motion;Visual/perceptual remediation/compensation;Patient/family education;Energy conservation;Manual Therapy;Balance training    Plan Review fall prevention strategies (STG3) and follow-up on adaptive keyboard; continue w/ scapula stabilization exercises that pt is able to safely complete at home (have pt return demo to ensure positioning and safety); estim for finger extension?    Recommended Other Services Currently receiving PT at this site    Consulted and Agree with Plan of Care Patient            Patient will benefit from skilled therapeutic intervention in order to improve the following deficits and impairments:   Body Structure / Function / Physical Skills: ADL, Strength, Decreased knowledge of use of DME, Tone, Dexterity, Balance, Body mechanics, Proprioception, UE functional use, IADL, ROM, Coordination, Mobility, FMC, Decreased knowledge of precautions, Gait, GMC, Endurance Cognitive Skills: Attention, Safety Awareness, Problem Solve Psychosocial Skills: Interpersonal Interaction, Environmental  Adaptations   Visit Diagnosis: Other lack of coordination  Muscle weakness (generalized)  Other abnormalities of gait and  mobility  History of falling   Problem List Patient Active Problem List   Diagnosis Date Noted   Slurred speech 02/26/2021   Autism 12/20/2020   Multiple sclerosis ([place]) 10/11/2020   MRI of brain abnormal 10/11/2020   Right sided weakness 10/11/2020   Gait disturbance 10/11/2020   Asperger syndrome 03/19/2013  Kathrine Cords, OTR/L, MSOT  04/24/2021, 10:16 AM  [place]. [place], [place], [address] Phone: [phone]   Fax:  [phone]  Name: George [name] MRN: [id number] Date of Birth: 1971-01-09

## 2021-04-30 DIAGNOSIS — R69 Illness, unspecified: Secondary | ICD-10-CM | POA: Diagnosis not present

## 2021-05-01 ENCOUNTER — Ambulatory Visit: Payer: PPO | Admitting: Physical Therapy

## 2021-05-01 ENCOUNTER — Encounter: Payer: Self-pay | Admitting: Occupational Therapy

## 2021-05-01 ENCOUNTER — Encounter: Payer: Self-pay | Admitting: Physical Therapy

## 2021-05-01 ENCOUNTER — Other Ambulatory Visit: Payer: Self-pay

## 2021-05-01 ENCOUNTER — Ambulatory Visit: Payer: PPO | Admitting: Occupational Therapy

## 2021-05-01 DIAGNOSIS — M6281 Muscle weakness (generalized): Secondary | ICD-10-CM

## 2021-05-01 DIAGNOSIS — R41842 Visuospatial deficit: Secondary | ICD-10-CM

## 2021-05-01 DIAGNOSIS — R2681 Unsteadiness on feet: Secondary | ICD-10-CM

## 2021-05-01 DIAGNOSIS — R41840 Attention and concentration deficit: Secondary | ICD-10-CM

## 2021-05-01 DIAGNOSIS — Z9181 History of falling: Secondary | ICD-10-CM

## 2021-05-01 DIAGNOSIS — R2689 Other abnormalities of gait and mobility: Secondary | ICD-10-CM

## 2021-05-01 DIAGNOSIS — R41844 Frontal lobe and executive function deficit: Secondary | ICD-10-CM

## 2021-05-01 DIAGNOSIS — R278 Other lack of coordination: Secondary | ICD-10-CM | POA: Diagnosis not present

## 2021-05-01 DIAGNOSIS — G35 Multiple sclerosis: Secondary | ICD-10-CM

## 2021-05-01 NOTE — Therapy (Signed)
[place] [place]. [place], [place], [address] Phone: [phone]   Fax:  [phone]  Physical Therapy Treatment  Patient Details  Name: George [name] MRN: [id number] Date of Birth: 10-Jun-1971 Referring Provider (PT): Lavone Orn, MD   Encounter Date: 05/01/2021   PT End of Session - 05/01/21 0930     Visit Number 5    Number of Visits 6    Date for PT Re-Evaluation 05/25/21    Authorization Type [employer]    PT Start Time 0845    PT Stop Time 0928    PT Time Calculation (min) 43 min    Equipment Utilized During Treatment Gait belt    Activity Tolerance Patient tolerated treatment well    Behavior During Therapy WFL for tasks assessed/performed             Past Medical History:  Diagnosis Date   Anxiety    Asperger's syndrome    Autistic spectrum disorder    Asperger's (per H&P Olivia Clelland, PA)   Bipolar 1 disorder ([place])    "no mood swings in 20 years"  on medication   GERD (gastroesophageal reflux disease)    Head injury 2020   Hypertension    Hypothyroidism    IBS (irritable bowel syndrome)     Past Surgical History:  Procedure Laterality Date   NO PAST SURGERIES     RADIOLOGY WITH ANESTHESIA N/A 05/16/2020   Procedure: MRI BRAIN WITH AND WITHOUT CONTRAST;  Surgeon: Radiologist, Medication, MD;  Location: [place];  Service: Radiology;  Laterality: N/A;   RADIOLOGY WITH ANESTHESIA N/A 08/29/2020   Procedure: MRI WITH ANESTHESIA CERVICAL SPINE WITH AND WITHOUT CONTRAST, THORACIC SPINE WITH AND WITHOUT CONTRAST;  Surgeon: Radiologist, Medication, MD;  Location: [facility];  Service: Radiology;  Laterality: N/A;   RADIOLOGY WITH ANESTHESIA N/A 03/15/2021   Procedure: MRI BRAIN WITH AND WITHOUT, CERIVAL WITH AND WITHOUT;  Surgeon: Radiologist, Medication, MD;  Location: [facility];  Service: Radiology;  Laterality: N/A;    There were no vitals filed for this visit.   Subjective Assessment -  05/01/21 0848     Subjective "Having a bad MD day. Everything is tight."    Pertinent History h/o Asperger syndrome and bipolar disease    Diagnostic tests MRI: A tiny T2 hyperintense lesion is questioned within the right  dorsolateral spinal cord at the C5 level (versus artifact). No other  cervical spinal cord signal abnormality is identified. No abnormal  cord enhancement.  Mild cervical spondylosis as described. No significant spinal canal  or foraminal stenosis.    Patient Stated Goals I would like to walk straight and look like a normal person.    Currently in Pain? Yes    Pain Score 1     Pain Location Coccyx    Pain Descriptors / Indicators Sharp    Pain Type Acute pain                               [facility] Adult PT Treatment/Exercise - 05/01/21 0001       Neuro Re-ed    Neuro Re-ed Details  alt toe tap on cone with PT stabilizing cone 3x10 with CGA      Knee/Hip Exercises: Stretches   Passive Hamstring Stretch Right;Left;30 seconds;2 reps    Passive Hamstring Stretch Limitations supine with strap    Gastroc Stretch Both;1 rep;30 seconds  Gastroc Stretch Limitations runner's stretch      Knee/Hip Exercises: Aerobic   Recumbent Bike L5.0 x6 min      Knee/Hip Exercises: Standing   Forward Step Up Right;1 set;Hand Hold: 2;Step Height: 4";15 reps;Step Height: 6"    Forward Step Up Limitations R step up/back with 2 UE support   cues to avoid locking R knee     Knee/Hip Exercises: Supine   Bridges with Ball Squeeze Strengthening;Both;1 set;10 reps   2x10; cues to contract glutes   Bridges with Clamshell Strengthening;Both;1 set;10 reps   2x10; red TB   Other Supine Knee/Hip Exercises LTR on pball 10x   cues to return to neutral with abdominals   Other Supine Knee/Hip Exercises alt bent knee fallout 10x red TB   cues to slow down                    PT Education - 05/01/21 0929     Education Details edu on trekking poles for improved safety  with ambulation    Person(s) Educated Patient    Methods Explanation;Handout    Comprehension Verbalized understanding              PT Short Term Goals - 04/17/21 0950       PT SHORT TERM GOAL #1   Title Pt will be independent with initial HEP.    Status Achieved               PT Long Term Goals - 04/17/21 0951       PT LONG TERM GOAL #1   Title Pt will be independent with progressing his strengthening and balance HEP    Status On-going      PT LONG TERM GOAL #2   Title Pt will have improved FGA score to at least 24/30 to demo low fall risk    Status On-going      PT LONG TERM GOAL #3   Title Pt will be able to navigate a flight of stairs with reciprocal pattern with good safety and no loss of balance.    Status On-going      PT LONG TERM GOAL #4   Title Pt will be able to tolerate ambulating >1000' with no LOBs outdoors and indoors for safe community mobility    Status On-going                   Plan - 05/01/21 0930     Clinical Impression Statement Patient reporting increased tightness this AM. Began session with gentle stretching to address patient's complaint. Patient demonstrated L>R HS tightness. Core strengthening ther-ex revealed core and hip instability but good amplitude of movement. Cueing for slow and control movement required throughout session d/t patient's tendency to perform things quickly. Standing dynamic balance exercises were performed with CGA d/t core and hip/ankle instability. More challenge demonstrated with exercises requiring R LE stability, requiring cues to fully weight shift on this side. Educated patient on benefits of using trekking poles for improved safety with ambulation. Patient apprehensive to use AD but reported that he would consider it. No complaints at end of session.    Comorbidities MS, Asperger's    PT Treatment/Interventions ADLs/Self Care Home Management;Aquatic Therapy;DME Instruction;Gait training;Stair  training;Functional mobility training;Therapeutic activities;Therapeutic exercise;Balance training;Neuromuscular re-education;Patient/family education;Orthotic Fit/Training;Manual techniques;Passive range of motion;Energy conservation;Taping    PT Next Visit Plan Work on strengthening R quad, hamstring, and ankle. Work on balance on compliant surface and narrow BOS head turns or  eyes closed.    Consulted and Agree with Plan of Care Patient             Patient will benefit from skilled therapeutic intervention in order to improve the following deficits and impairments:  Abnormal gait, Difficulty walking, Decreased balance, Decreased endurance, Decreased activity tolerance, Decreased mobility, Decreased strength, Impaired UE functional use  Visit Diagnosis: Muscle weakness (generalized)  Other abnormalities of gait and mobility  MS (multiple sclerosis) ([place])  History of falling  Unsteadiness on feet     Problem List Patient Active Problem List   Diagnosis Date Noted   Slurred speech 02/26/2021   Autism 12/20/2020   Multiple sclerosis ([place]) 10/11/2020   MRI of brain abnormal 10/11/2020   Right sided weakness 10/11/2020   Gait disturbance 10/11/2020   Asperger syndrome 03/19/2013     Janene Harvey, PT, DPT 05/01/21 9:32 AM    [place]. [place], [place], [address] Phone: [phone]   Fax:  [phone]  Name: ESAIAH WANLESS MRN: [id number] Date of Birth: 04/29/1971

## 2021-05-01 NOTE — Therapy (Signed)
[facility]. [place], [place], [address] Phone: [phone]   Fax:  [phone]  Occupational Therapy Treatment  Patient Details  Name: George [name] MRN: [id number] Date of Birth: 04-02-1971 Referring Provider (OT): Arlice Colt, MD   Encounter Date: 05/01/2021   OT End of Session - 05/01/21 0954     Visit Number 5    Number of Visits 9    Date for OT Re-Evaluation 07/01/21    Authorization Type [employer]   $30 copay   OT Start Time 0930    OT Stop Time 1014    OT Time Calculation (min) 44 min    Activity Tolerance Patient tolerated treatment well    Behavior During Therapy [facility] for tasks assessed/performed            Past Medical History:  Diagnosis Date   Anxiety    Asperger's syndrome    Autistic spectrum disorder    Asperger's (per H&P Olivia Clelland, PA)   Bipolar 1 disorder ([place])    "no mood swings in 20 years"  on medication   GERD (gastroesophageal reflux disease)    Head injury 2020   Hypertension    Hypothyroidism    IBS (irritable bowel syndrome)     Past Surgical History:  Procedure Laterality Date   NO PAST SURGERIES     RADIOLOGY WITH ANESTHESIA N/A 05/16/2020   Procedure: MRI BRAIN WITH AND WITHOUT CONTRAST;  Surgeon: Radiologist, Medication, MD;  Location: [facility];  Service: Radiology;  Laterality: N/A;   RADIOLOGY WITH ANESTHESIA N/A 08/29/2020   Procedure: MRI WITH ANESTHESIA CERVICAL SPINE WITH AND WITHOUT CONTRAST, THORACIC SPINE WITH AND WITHOUT CONTRAST;  Surgeon: Radiologist, Medication, MD;  Location: [facility];  Service: Radiology;  Laterality: N/A;   RADIOLOGY WITH ANESTHESIA N/A 03/15/2021   Procedure: MRI BRAIN WITH AND WITHOUT, CERIVAL WITH AND WITHOUT;  Surgeon: Radiologist, Medication, MD;  Location: [facility];  Service: Radiology;  Laterality: N/A;    There were no vitals filed for this visit.   Subjective Assessment - 05/01/21 0930     Subjective  "What are  we working on today?" Pt also reports he has been practicing coordination activities w/ pegs, marbles, and cards consistently at home.    Pertinent History MS suspected in mid 30's, worsened in mid-2021; experienced increased difficulty w/ Rt hand coordination and Rt foot drop. PMH also includes ASD (Asperger's syndrome), biplolar disorder, anxiety, and HTN    Limitations High fall risk    Patient Stated Goals Improve balance/walking and R-sided coordination    Currently in Pain? Yes   Pt reports pain is consistently improving   Pain Score 1     Pain Location Coccyx    Pain Orientation Medial    Pain Descriptors / Indicators Other (Comment)   "Annoying"   Pain Type Acute pain    Pain Onset 1 to 4 weeks ago             Treatment/Exercises - 05/01/21    Weight Bearing Weight shifting to R side w/ wt bearing through R elbow/forearm to retrieve 1" blocks from floor level w/ contralateral LUE; transferred block to RUE to place into a stack on elevated tabletop surface. Completed set in reverse to return blocks to container on floor level. Activity facilitated muscle activation and proprioceptive input; OT provided min verbal cues for pacing and assist w/ stabilizing tower when stacking blocks    Power Grip Exerciser Picking up 1"  blocks using power grip exerciser w/ R hand and placing in a container on L side to facilitate [place], functional grasp/release, visual perception, and hand strengthening. Pt completed 2 sets of 15 at 20# level (yellow coil) w/ mod drops; multiple rest breaks. Due to difficulty, OT modified activity w/ pt returning blocks to container positioned at lower height on R side with increased success         OT Education - 05/01/21 0953     Education Details OT discussed [employer] [place] website, including potential benefit of exploring resources and local support/activity groups; provided handout w/ information and how to access website. Also reiterated safety benefit of  ambulating w/ AD discussed in preceding PT session; OT provided additional handout for example of walking staff.    Person(s) Educated Patient    Methods Explanation;Handout    Comprehension Verbalized understanding             OT Short Term Goals - 04/24/21 0829       OT SHORT TERM GOAL #1   Title Pt will demo independence w/ HEP designed for BUE stabilization and strengthening    Baseline Currently has HEP designed for coordination    Time 4    Period Weeks    Status On-going    Target Date 05/03/21      OT SHORT TERM GOAL #2   Title Pt will verbalize understanding with potential AE/DME (button hook, shower seat, etc) to improve safety, ease, and independence with ADLs    Baseline Decreased knowledge of AE and DME options    Time 4    Period Weeks    Status Achieved      OT SHORT TERM GOAL #3   Title Pt will verbalize understanding of fall prevention strategies, including AE prn, to improve safety w/ in-home mobility and decrease risk for continued falls    Baseline Pt reports history of multiple falls    Time 4    Period Weeks    Status Partially Met   04/24/21     OT SHORT TERM GOAL #4   Title Pt will verbalize understanding of potential assistive technology for typing, as well as provision of contact info for local A.T. program, if desired    Baseline Unable to type w/ BUEs at this time    Time 4    Period Weeks    Status On-going             OT Long Term Goals - 05/01/21 1001       OT LONG TERM GOAL #1   Title Pt will improve coordination w/ R hand to improve independence w/ clothing manipulatives as evidenced by improving 9-HPT time by at least 10 sec    Baseline 9-HPT in 90 sec w/ RUE    Time 8    Period Weeks    Status Achieved   05/01/21 - 80 sec w/ RUE     OT LONG TERM GOAL #2   Title Pt will safely complete bilateral task w/ less than 4 drops    Baseline Reports frequently dropping objects    Time 8    Period Weeks    Status On-going      OT  LONG TERM GOAL #3   Title Pt to demo safety with meal prep activity using compensatory techniques and AE prn    Baseline Mod I w/ meal prep, per pt report; decreased safety awareness    Time 8    Period Weeks  Status On-going      OT LONG TERM GOAL #4   Title Pt will improve RUE grip strength by at least 10 lbs to assist in opening tight jars/containers    Baseline 39# w/ RUE (89# w/ LUE)    Time 8    Period Weeks    Status On-going      OT LONG TERM GOAL #5   Title Pt will safely complete IADL task, including functional mobility and standing tolerance components prn, w/ Mod I by discharge    Baseline Unsteadiness on feet; frequent falls    Time 8    Period Weeks    Status On-going             Plan - 05/01/21 1051     Clinical Impression Statement Pt is doing well and progressing toward goals. OT continued to facilitate FM and GM control and coordination this session, incorporating wt bearing through RUE for scapular and shoulder girdle stabilization, muscle activation, and proprioceptive input. Pt's success w/ visual perceptual coordination activity improved w/ repetition, suggesting potential benefit of wt bearing for increased control and stability. Decreased drops w/ repositioning of block container to lower height and R side, which indicates continued limitations of RUE strength. Pt also continues to benefit from reiteration of safety strategies across disciplines (PT and OT).    OT Occupational Profile and History Detailed Assessment- Review of Records and additional review of physical, cognitive, psychosocial history related to current functional performance    Occupational performance deficits (Please refer to evaluation for details): ADL's;IADL's;Work;Leisure;Social Participation    Body Structure / Function / Physical Skills ADL;Strength;Decreased knowledge of use of DME;Tone;Dexterity;Balance;Body mechanics;Proprioception;UE functional  use;IADL;ROM;Coordination;Mobility;FMC;Decreased knowledge of precautions;Gait;GMC;Endurance    Cognitive Skills Attention;Safety Awareness;Problem Solve    Psychosocial Skills Interpersonal Interaction;Environmental  Adaptations    Rehab Potential Fair   Decreased awareness of deficits and reported min carryover of learned strategies   Clinical Decision Making Several treatment options, min-mod task modification necessary    Comorbidities Affecting Occupational Performance: Presence of comorbidities impacting occupational performance    Comorbidities impacting occupational performance description: ASD, bipolar disorder    Modification or Assistance to Complete Evaluation  Min-Moderate modification of tasks or assist with assess necessary to complete eval    OT Frequency 1x / week    OT Duration 8 weeks    OT Treatment/Interventions Self-care/ADL training;DME and/or AE instruction;Splinting;Therapeutic activities;Psychosocial skills training;Therapeutic exercise;Coping strategies training;Functional Mobility Training;Neuromuscular education;Passive range of motion;Visual/perceptual remediation/compensation;Patient/family education;Energy conservation;Manual Therapy;Balance training    Plan Review fall prevention strategies (STG3) and follow-up on adaptive keyboard; continue w/ scapula stabilization exercises that pt is able to safely complete at home (have pt return demo to ensure positioning and safety); estim for finger extension?    Recommended Other Services Currently receiving PT at this site    Consulted and Agree with Plan of Care Patient            Patient will benefit from skilled therapeutic intervention in order to improve the following deficits and impairments:   Body Structure / Function / Physical Skills: ADL, Strength, Decreased knowledge of use of DME, Tone, Dexterity, Balance, Body mechanics, Proprioception, UE functional use, IADL, ROM, Coordination, Mobility, FMC, Decreased  knowledge of precautions, Gait, GMC, Endurance Cognitive Skills: Attention, Safety Awareness, Problem Solve Psychosocial Skills: Interpersonal Interaction, Environmental  Adaptations   Visit Diagnosis: Muscle weakness (generalized)  Other lack of coordination  Other abnormalities of gait and mobility  Attention and concentration deficit  Visuospatial deficit  Frontal  lobe and executive function deficit   Problem List Patient Active Problem List   Diagnosis Date Noted   Slurred speech 02/26/2021   Autism 12/20/2020   Multiple sclerosis ([place]) 10/11/2020   MRI of brain abnormal 10/11/2020   Right sided weakness 10/11/2020   Gait disturbance 10/11/2020   Asperger syndrome 03/19/2013    Kathrine Cords, OTR/L, MSOT  05/01/2021, 4:29 PM  [facility]. [place], [place], [address] Phone: [phone]   Fax:  [phone]  Name: NOEMI ISHMAEL MRN: [id number] Date of Birth: 08-28-1970

## 2021-05-07 DIAGNOSIS — E291 Testicular hypofunction: Secondary | ICD-10-CM | POA: Diagnosis not present

## 2021-05-07 DIAGNOSIS — Z23 Encounter for immunization: Secondary | ICD-10-CM | POA: Diagnosis not present

## 2021-05-08 ENCOUNTER — Ambulatory Visit: Payer: PPO | Admitting: Physical Therapy

## 2021-05-08 ENCOUNTER — Ambulatory Visit: Payer: PPO | Admitting: Occupational Therapy

## 2021-05-15 ENCOUNTER — Ambulatory Visit: Payer: PPO | Admitting: Occupational Therapy

## 2021-05-15 ENCOUNTER — Ambulatory Visit: Payer: PPO | Admitting: Physical Therapy

## 2021-05-15 ENCOUNTER — Other Ambulatory Visit: Payer: Self-pay

## 2021-05-15 ENCOUNTER — Encounter: Payer: PPO | Admitting: Occupational Therapy

## 2021-05-16 ENCOUNTER — Telehealth: Payer: Self-pay | Admitting: Neurology

## 2021-05-16 DIAGNOSIS — R269 Unspecified abnormalities of gait and mobility: Secondary | ICD-10-CM

## 2021-05-16 DIAGNOSIS — G35 Multiple sclerosis: Secondary | ICD-10-CM

## 2021-05-16 DIAGNOSIS — R4781 Slurred speech: Secondary | ICD-10-CM

## 2021-05-16 DIAGNOSIS — R531 Weakness: Secondary | ICD-10-CM

## 2021-05-16 NOTE — Telephone Encounter (Signed)
Called pt back. [facility] but then got referred elsewhere back in August per his request. He states other location 20 min away. He would like to go back to [facility]. Advised we will place new referral for this. He will f/u with then in a couple days. Nothing further needed.

## 2021-05-16 NOTE — Telephone Encounter (Signed)
Pt called stating that he is wanting a new referral to return to the [facility]. Please advise.

## 2021-05-22 ENCOUNTER — Ambulatory Visit: Payer: PPO | Admitting: Occupational Therapy

## 2021-05-22 ENCOUNTER — Ambulatory Visit: Payer: PPO | Admitting: Physical Therapy

## 2021-05-28 ENCOUNTER — Encounter: Payer: PPO | Admitting: Occupational Therapy

## 2021-05-28 ENCOUNTER — Ambulatory Visit: Payer: PPO | Admitting: Physical Therapy

## 2021-05-28 DIAGNOSIS — E291 Testicular hypofunction: Secondary | ICD-10-CM | POA: Diagnosis not present

## 2021-05-29 ENCOUNTER — Telehealth: Payer: Self-pay | Admitting: Neurology

## 2021-05-29 ENCOUNTER — Encounter: Payer: Self-pay | Admitting: *Deleted

## 2021-05-29 NOTE — Telephone Encounter (Signed)
Called pt. Advised Dr. Felecia Shelling out of the office this week. Advised chronic caffeine intake not the best option for fatigue/MS. We often rx modafinil/armodafinil for this. He has not tried this in the past. He will discuss these options at upcoming appt w/ MD 06/20/21. Hesitiant to start before talking w/ MD. Worried about med affecting liver/kidney. Also worried about it affecting bipolar disorder (he is on depakote).   He is taking in about 200mg  caffeine per day (sun drop soda). Does not drink coffee d/t hx IBS.  He would like letter stating MS dx. He will pick up this week. Aware I will have Dr. Leta Baptist sign since Dr. Felecia Shelling out.

## 2021-05-29 NOTE — Telephone Encounter (Signed)
Pt called, want to know if caffeine is safe to use with MS to keep you energized and is there a way to take caffeine a long period of time with out losing the effect.   Mail in group  (support group) requesting letter t verify have been diagnosed with MS. Would like a call from the nurse let me know when to pick up the letter.

## 2021-06-01 ENCOUNTER — Ambulatory Visit: Payer: PPO | Attending: Neurology

## 2021-06-01 ENCOUNTER — Encounter: Payer: Self-pay | Admitting: Occupational Therapy

## 2021-06-01 ENCOUNTER — Ambulatory Visit: Payer: PPO | Admitting: Occupational Therapy

## 2021-06-01 ENCOUNTER — Other Ambulatory Visit: Payer: Self-pay

## 2021-06-01 DIAGNOSIS — R2689 Other abnormalities of gait and mobility: Secondary | ICD-10-CM

## 2021-06-01 DIAGNOSIS — R4781 Slurred speech: Secondary | ICD-10-CM | POA: Insufficient documentation

## 2021-06-01 DIAGNOSIS — R2681 Unsteadiness on feet: Secondary | ICD-10-CM

## 2021-06-01 DIAGNOSIS — M6281 Muscle weakness (generalized): Secondary | ICD-10-CM

## 2021-06-01 DIAGNOSIS — R278 Other lack of coordination: Secondary | ICD-10-CM

## 2021-06-01 DIAGNOSIS — G35 Multiple sclerosis: Secondary | ICD-10-CM | POA: Insufficient documentation

## 2021-06-01 DIAGNOSIS — R41840 Attention and concentration deficit: Secondary | ICD-10-CM | POA: Diagnosis not present

## 2021-06-01 DIAGNOSIS — R269 Unspecified abnormalities of gait and mobility: Secondary | ICD-10-CM | POA: Insufficient documentation

## 2021-06-01 DIAGNOSIS — R531 Weakness: Secondary | ICD-10-CM | POA: Insufficient documentation

## 2021-06-01 DIAGNOSIS — Z9181 History of falling: Secondary | ICD-10-CM | POA: Diagnosis not present

## 2021-06-01 NOTE — Therapy (Addendum)
[facility] [address] [place], [place], [address] Phone: [phone]   Fax:  [phone]  Occupational Therapy Evaluation  Patient Details  Name: George [name] MRN: [id number] Date of Birth: [birth year] Referring Provider (OT): Arlice Colt, MD   Encounter Date: 06/01/2021   OT End of Session - 06/01/21 0921     Visit Number 1    Number of Visits 9    Date for OT Re-Evaluation 07/27/21    Authorization Type [employer]   $30 copay   Authorization Time Period $30 copay, VL:MN    OT Start Time 0930    OT Stop Time 1015    OT Time Calculation (min) 45 min    Activity Tolerance Patient tolerated treatment well    Behavior During Therapy [facility] for tasks assessed/performed             Past Medical History:  Diagnosis Date   Anxiety    Asperger's syndrome    Autistic spectrum disorder    Asperger's (per H&P Olivia Clelland, PA)   Bipolar 1 disorder ([place])    "no mood swings in 20 years"  on medication   GERD (gastroesophageal reflux disease)    Head injury 2020   Hypertension    Hypothyroidism    IBS (irritable bowel syndrome)     Past Surgical History:  Procedure Laterality Date   NO PAST SURGERIES     RADIOLOGY WITH ANESTHESIA N/A 05/16/2020   Procedure: MRI BRAIN WITH AND WITHOUT CONTRAST;  Surgeon: Radiologist, Medication, MD;  Location: [facility];  Service: Radiology;  Laterality: N/A;   RADIOLOGY WITH ANESTHESIA N/A 08/29/2020   Procedure: MRI WITH ANESTHESIA CERVICAL SPINE WITH AND WITHOUT CONTRAST, THORACIC SPINE WITH AND WITHOUT CONTRAST;  Surgeon: Radiologist, Medication, MD;  Location: [facility];  Service: Radiology;  Laterality: N/A;   RADIOLOGY WITH ANESTHESIA N/A 03/15/2021   Procedure: MRI BRAIN WITH AND WITHOUT, CERIVAL WITH AND WITHOUT;  Surgeon: Radiologist, Medication, MD;  Location: [facility];  Service: Radiology;  Laterality: N/A;    There were no vitals filed for this visit.    Subjective Assessment - 06/01/21 0922     Subjective  Pt is a 50 year old male that presents to Neuro OPOT with MS and deficits with RUE coordination. Pt was seen for evaluation at this same clinic and seen for several session in July/August 2022 and transferred to neuro [facility] clinic and seen for a few sessions before transferring back to this clinic. Pt is back here to be seen for RUE coordination and weakness, unsteadiness on feet. 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    Pertinent History MS suspected in mid 30's, worsened in mid-2021; experienced increased difficulty w/ Rt hand coordination and Rt foot drop. PMH also includes ASD (Asperger's syndrome), biplolar disorder, anxiety, and HTN    Limitations High fall risk    Currently in Pain? No/denies   no pain currently however reports 2/10 pain in shoulder at times and 8/10 pain in knee at times   Pain Score 0-No pain               [facility] OT Assessment - 06/01/21 1497       Assessment   Medical Diagnosis MS    Referring Provider (OT) Arlice Colt, MD    Onset Date/Surgical Date --   diagnosed in mid 30's, but worsening symptoms beginning in [place]  Dominance Left    Next MD Visit 06/20/21   Neurology   Prior Therapy Yes   PT/OT/ST     Precautions   Precautions Fall      Restrictions   Weight Bearing Restrictions No      Balance Screen   Has the patient fallen in the past 6 months Yes    How many times? Multiple   pt reports falling less     Home  Environment   Family/patient expects to be discharged to: Private residence    Living Arrangements Alone   + 1 dog   Type of [place]    Entrance Stairs-Number of Steps Halls One level    Bathroom Building control surveyor   with grab bars   [place] bars - tub/shower   does not currently have chair but starting to begin to accept the idea of a chair in  shower   Additional Comments pt's parents live in [place] Requirements joined voc rehab   says he will be working soon   Nurse, children's, music, gardening, currently writing fictional novel      ADL   Eating/Feeding Modified independent   self-purchased a knife for single-arm cutting of food   Grooming Modified independent   difficulty w/ bilateral tasks   Upper Body Bathing Modified independent    Lower Body Bathing Modified independent    Upper Body Dressing Increased time   difficulty w/ clothing manipulatives   Lower Body Dressing Increased time   difficulty w/ clothing manipulatives; has AE for tying shoes   Toilet Transfer Modified independent    Toileting - Clothing Manipulation Modified independent    [place] Transfer Modified independent   uses grab bars; decreased safety awareness   ADL comments Pt reports he has self-purchased a one-handed cutting board, but does not use it frequently      IADL   Shopping Shops independently for small purchases    Light Housekeeping Performs light daily tasks such as dishwashing, bed making;Does personal laundry completely    Meal Prep Plans, prepares and serves adequate meals independently   per pt report   Programmer, applications own vehicle   Pt will benefit from continued monitoring of foundational driving skills for safety   Medication Management Is responsible for taking medication in correct dosages at correct time    Physiological scientist financial matters independently (budgets, writes checks, pays rent, bills goes to bank), collects and keeps track of income   Dad assists w/ finances     Mobility   Mobility Status Comments Pt ambulated without AD to evaluation.      Written Expression   Dominant Hand Left      Vision - History   Baseline Vision Wears glasses only for  reading    Additional Comments denies change      Vision Assessment   Comment To be further assessed in functional context      Cognition   Overall Cognitive Status Cognition to be further assessed in functional context PRN    Area of Impairment Safety/judgement;Awareness;Problem solving      Observation/Other Assessments   Observations Unsteadiness on feet w/ ambulation; decreased coordination and eye-hand coordination w/ R hand > L  Focus on Therapeutic Outcomes (FOTO)  N/A      Sensation   Light Touch Appears Intact   in UE's   Hot/Cold Appears Intact      Coordination   Gross Motor Movements are Fluid and Coordinated No    Fine Motor Movements are Fluid and Coordinated No    Coordination and Movement Description Decreased smoothness; ROM and coordination LUE > RUE    9 Hole Peg Test Right;Left    Right 9 Hole Peg Test 75 s    Left 9 Hole Peg Test 25 s    Box and Blocks R 31, L 51    Coordination Unable to type w/ Rt hand, using only Lt hand at this time; reports difficulty manipulating xbox controller      Praxis   Praxis Impaired      Edema   Edema none      Tone   Assessment Location Right Upper Extremity      ROM / Strength   AROM / PROM / Strength AROM;Strength      AROM   Overall AROM  Within functional limits for tasks performed    Overall AROM Comments BUE AROM WFL except R hand, w/ decreased finger extension and isolated movements due to tone/dystonia; RUE weakness and decreased control    AROM Assessment Site Shoulder    Right/Left Shoulder Right;Left    Right Shoulder Flexion 130 Degrees    Left Shoulder Flexion 140 Degrees      Strength   Overall Strength Within functional limits for tasks performed    Strength Assessment Site Shoulder    Right/Left Shoulder Right;Left    Right Shoulder Flexion 4-/5    Right Shoulder Extension 3+/5    Right Shoulder ABduction 3+/5    Left Shoulder Flexion 4/5    Left Shoulder Extension 4/5    Left Shoulder  ABduction 4-/5      Hand Function   Right Hand Gross Grasp Impaired   more prominent effects on ulnar side with extension   Right Hand Grip (lbs) 55.9 lb    Right Hand Lateral Pinch 22 lbs    Right Hand 3 Point Pinch 12 lbs    Left Hand Gross Grasp Functional    Left Hand Grip (lbs) 96.7 lb    Left Hand Lateral Pinch 26 lbs    Left 3 point pinch 22 lbs      RUE Tone   RUE Tone Mild   dystonia in R hand                               OT Short Term Goals - 06/01/21 1003       OT SHORT TERM GOAL #1   Title Pt will be independent with intiial HEP for RUE    Baseline Currently has HEP designed for coordination    Time 4    Period Weeks    Status New    Target Date 06/29/21      OT SHORT TERM GOAL #2   Title Pt will verbalize understanding of adapted strategies and equipment PRN for increasing safety and independence with ADLs and IADLs (i.e. cutting devices, fall prevention, shower seat, etc)    Baseline interested in more information re: shower chair    Time 4    Period Weeks    Status New      OT SHORT TERM GOAL #3   Title Pt will  increase grip strength in RUE to 60 lbs or greater for increasing stability and functional use    Baseline R 55.9, L 96.7    Time 4    Period Weeks    Status New      OT SHORT TERM GOAL #4   Title Pt will obtain a lightweight object from mid level shelf with good body mechanics and no compensatory movements and with pain less than or equal to 1/10.    Time 4    Period Weeks    Status New      OT SHORT TERM GOAL #5   Title Pt will increase fine motor coordination in RUE by completing 9 hole peg test in 70 seconds or less.    Baseline R 75s L 25s    Time 4    Period Weeks    Status New               OT Long Term Goals - 06/01/21 1012       OT LONG TERM GOAL #1   Title Pt will be independent with updated HEP.    Time 8    Period Weeks    Status New    Target Date 07/27/21      OT LONG TERM GOAL #2    Title Pt will achieve shoulder flexion of 150* or greater in RUE for increasing reach and decrease pain.    Baseline 130*    Time 8    Period Weeks    Status New      OT LONG TERM GOAL #3   Title Pt will improve grip strength in RUE to 65 lbs or greater for increase in stability and functional use in RUE.    Baseline R 55.9 lbs, L 96.7 lbs    Time 8    Period Weeks    Status New      OT LONG TERM GOAL #4   Title Pt will increase fine motor coordination in RUE and completing 9 hole peg test in 65 seconds or less.    Baseline R 75s L 25s    Time 8    Period Weeks    Status New                   Plan - 06/01/21 0923     Clinical Impression Statement Pt is a 50 y/o male who presents to OP OT due to MS w/ residual deficits in coordination and R-sided weakness. Pt currently lives alone and works as an Training and development officer. PMHx includes autism spectrum disorder (Asperger's syndrome, per chart review), bipolar disorder, anxiety, and HTN. Pt will benefit from skilled occupational therapy services to address coordination, ROM, balance, GM/FM control, safety awareness, introduction of compensatory strategies/AE prn, visual-perception, and implementation of an HEP to improve participation and safety during ADLs.    OT Occupational Profile and History Detailed Assessment- Review of Records and additional review of physical, cognitive, psychosocial history related to current functional performance    Occupational performance deficits (Please refer to evaluation for details): ADL's;IADL's;Work;Leisure;Social Participation    Body Structure / Function / Physical Skills ADL;Strength;Decreased knowledge of use of DME;Tone;Dexterity;Balance;Body mechanics;Proprioception;UE functional use;IADL;ROM;Coordination;Mobility;FMC;Decreased knowledge of precautions;Gait;GMC;Endurance    Cognitive Skills Attention;Safety Awareness;Problem Solve    Psychosocial Skills Interpersonal Interaction;Environmental  Adaptations     Rehab Potential Fair   Decreased awareness of deficits and reported min carryover of learned strategies   Clinical Decision Making Several treatment options, min-mod task modification necessary  Comorbidities Affecting Occupational Performance: Presence of comorbidities impacting occupational performance    Comorbidities impacting occupational performance description: ASD, bipolar disorder    Modification or Assistance to Complete Evaluation  Min-Moderate modification of tasks or assist with assess necessary to complete eval    OT Frequency 1x / week    OT Duration 8 weeks    OT Treatment/Interventions Self-care/ADL training;DME and/or AE instruction;Splinting;Therapeutic activities;Psychosocial skills training;Therapeutic exercise;Coping strategies training;Functional Mobility Training;Neuromuscular education;Passive range of motion;Visual/perceptual remediation/compensation;Patient/family education;Energy conservation;Manual Therapy;Balance training    Plan Scapula stabilization, closed-chain AROM, and coordination RUE    Recommended Other Services Currently receiving PT at this site    Consulted and Agree with Plan of Care Patient             Patient will benefit from skilled therapeutic intervention in order to improve the following deficits and impairments:   Body Structure / Function / Physical Skills: ADL, Strength, Decreased knowledge of use of DME, Tone, Dexterity, Balance, Body mechanics, Proprioception, UE functional use, IADL, ROM, Coordination, Mobility, FMC, Decreased knowledge of precautions, Gait, GMC, Endurance Cognitive Skills: Attention, Safety Awareness, Problem Solve Psychosocial Skills: Interpersonal Interaction, Environmental  Adaptations   Visit Diagnosis: Muscle weakness (generalized) - Plan: Ot plan of care cert/re-cert  Attention and concentration deficit - Plan: Ot plan of care cert/re-cert  Unsteadiness on feet - Plan: Ot plan of care  cert/re-cert  Other lack of coordination - Plan: Ot plan of care cert/re-cert  Other abnormalities of gait and mobility - Plan: Ot plan of care cert/re-cert    Problem List Patient Active Problem List   Diagnosis Date Noted   Slurred speech 02/26/2021   Autism 12/20/2020   Multiple sclerosis ([place]) 10/11/2020   MRI of brain abnormal 10/11/2020   Right sided weakness 10/11/2020   Gait disturbance 10/11/2020   Asperger syndrome 03/19/2013    Zachery Conch, OT/L 06/01/2021, 2:32 PM  [facility] [address] [place], [place], [address] Phone: [phone]   Fax:  [phone]  Name: George [name] MRN: [id number] Date of Birth: Sep 09, 1970

## 2021-06-01 NOTE — Addendum Note (Signed)
Addended by: Cherly Anderson A on: 06/01/2021 01:19 PM   Modules accepted: Orders

## 2021-06-01 NOTE — Therapy (Signed)
[facility]. [place], [place], [address] Phone: [phone]   Fax:  [phone]  Patient Details  Name: George [name] MRN: [id number] Date of Birth: 10-20-1970 Referring Provider: Arlice Colt, MD  Encounter Date: 06/01/2021  The above patient had been seen in Occupational Therapy 5 times, cancelling all remaining visits after his last appt on 05/01/21.  Current functional level related to goals / functional outcomes: Pt goals were not met as they were unable to be assessed due to unexpected d/c.  Remaining deficits: R-sided weakness, fall risk/decreased balance, decreased control and coordination, and decreased knowledge of precautions and safety strategies  Education / Equipment: OT introduced and reviewed potentially beneficial DME/AE, fall prevention strategies, coordination HEP, and condition-specific resources, including local resources.  Plan: Will d/c episode of care at this time  Kathrine Cords, OTR/L, MSOT 06/01/2021, 9:22 AM  [facility]. [place], [place], [address] Phone: [phone]   Fax:  [phone]

## 2021-06-01 NOTE — Therapy (Signed)
[facility] [address] [place], [place], [address] Phone: [phone]   Fax:  [phone]  Physical Therapy Evaluation  Patient Details  Name: George [name] MRN: [id number] Date of Birth: June 27, 1971 Referring Provider (PT): Arlice Colt, MD   Encounter Date: 06/01/2021   PT End of Session - 06/01/21 1024     Visit Number 1    Number of Visits 9    Date for PT Re-Evaluation 07/27/21    Authorization Type [employer]    PT Start Time 1019    PT Stop Time 1106    PT Time Calculation (min) 47 min    Equipment Utilized During Treatment Gait belt    Activity Tolerance Patient tolerated treatment well    Behavior During Therapy Impulsive             Past Medical History:  Diagnosis Date   Anxiety    Asperger's syndrome    Autistic spectrum disorder    Asperger's (per H&P Olivia Clelland, PA)   Bipolar 1 disorder ([place])    "no mood swings in 20 years"  on medication   GERD (gastroesophageal reflux disease)    Head injury 2020   Hypertension    Hypothyroidism    IBS (irritable bowel syndrome)     Past Surgical History:  Procedure Laterality Date   NO PAST SURGERIES     RADIOLOGY WITH ANESTHESIA N/A 05/16/2020   Procedure: MRI BRAIN WITH AND WITHOUT CONTRAST;  Surgeon: Radiologist, Medication, MD;  Location: [facility];  Service: Radiology;  Laterality: N/A;   RADIOLOGY WITH ANESTHESIA N/A 08/29/2020   Procedure: MRI WITH ANESTHESIA CERVICAL SPINE WITH AND WITHOUT CONTRAST, THORACIC SPINE WITH AND WITHOUT CONTRAST;  Surgeon: Radiologist, Medication, MD;  Location: [facility];  Service: Radiology;  Laterality: N/A;   RADIOLOGY WITH ANESTHESIA N/A 03/15/2021   Procedure: MRI BRAIN WITH AND WITHOUT, CERIVAL WITH AND WITHOUT;  Surgeon: Radiologist, Medication, MD;  Location: [facility];  Service: Radiology;  Laterality: N/A;    There were no vitals filed for this visit.    Subjective Assessment - 06/01/21 1024      Subjective Pt was diagnosed with MS in mid 64s. Reports he had low testosterone and noted some issues then. Pt reports a fall last year down steps when hit his head. He was dragging carpet and hand slipped causing him to fall. Pt reports right sided weakness worse since time of fall and Dr. Felecia Shelling feels more MS related. Pt saw PT earlier this year here and then at [facility]. Pt exercises 3x/week at [employer].    Patient Stated Goals Pt would like to be able to walk straight and live a long life.    Currently in Pain? Yes    Pain Score 1     Pain Location Shoulder    Pain Orientation Right    Pain Descriptors / Indicators Aching    Pain Type Chronic pain    Pain Onset More than a month ago    Pain Frequency Intermittent    Multiple Pain Sites Yes    Pain Score 6    Pain Location Knee    Pain Orientation Right    Pain Descriptors / Indicators Sharp    Pain Type Acute pain    Pain Onset 1 to 4 weeks ago    Pain Frequency Intermittent    Aggravating Factors  when knee snaps back  [facility] PT Assessment - 06/01/21 1032       Assessment   Medical Diagnosis Ms    Referring Provider (PT) Arlice Colt, MD    Onset Date/Surgical Date 05/16/21    Hand Dominance Left    Prior Therapy Yes   outpatient PT/OT this year     Precautions   Precautions Fall      Balance Screen   Has the patient fallen in the past 6 months Yes    How many times? Just a few, pt states they are having less falls than before    Has the patient had a decrease in activity level because of a fear of falling?  No    Is the patient reluctant to leave their home because of a fear of falling?  No      Home Environment   Living Environment Private residence    Type of [place] to enter    Entrance Stairs-Number of Steps 2   2 in the front and 5 in the back   Entrance Stairs-Rails Right;Can reach both   right rail in the front and left in the back   [place] bars - tub/shower      Prior Function   Level of [place] On disability    Vocation Requirements joined voc rehab    Leisure painting, music, gardening, currently writing fictional novel      Cognition   Area of Impairment Safety/judgement;Awareness;Problem solving      Sensation   Light Touch Appears Intact      Coordination   Gross Motor Movements are Fluid and Coordinated No   Impaired slight on right with pro/sup   Fine Motor Movements are Fluid and Coordinated No   Impaired on right with opposition   Finger Nose Finger Test Impaired on right    Heel Shin Test normal on both      Strength   Strength Assessment Site Elbow;Shoulder;Knee;Ankle;Hip;Hand    Right Shoulder Flexion 4/5    Left Shoulder Flexion 5/5    Right/Left Elbow Right;Left    Right Elbow Flexion 4/5    Right Elbow Extension 4/5    Left Elbow Flexion 5/5    Left Elbow Extension 5/5    Right/Left hand Right;Left    Right Hand Gross Grasp Functional   slightly left than left   Left Hand Gross Grasp Functional    Right Hip Flexion 4/5    Right Hip Extension 5/5    Right Hip ABduction 4/5    Left Hip Flexion 5/5    Left Hip Extension 5/5    Left Hip ABduction 5/5    Right Knee Flexion 3+/5    Right Knee Extension 4+/5    Left Knee Flexion 5/5    Left Knee Extension 5/5    Right Ankle Dorsiflexion 4+/5    Left Ankle Dorsiflexion 5/5      Bed Mobility   Bed Mobility Rolling Right;Rolling Left;Supine to Sit;Sit to Supine    Rolling Right Independent    Rolling Left Independent    Supine to Sit Independent    Sit to Supine Independent      Transfers   Transfers Sit to Stand;Stand to Sit    Sit to Stand 7: Independent    Stand to Sit 7: Independent      Ambulation/Gait   Ambulation/Gait Yes    Ambulation/Gait  Assistance 5: Supervision    Ambulation/Gait Assistance Details --   Instability noted in the right knee w/ decreased toe  off on the right as well   Ambulation Distance (Feet) 150 Feet    Assistive device None    Gait Pattern Decreased arm swing - right;Decreased arm swing - left;Step-through pattern;Decreased weight shift to right;Decreased stance time - right    Ambulation Surface Level;Indoor    Gait velocity .89 m/s, 11.14 seconds    Stairs Yes    Stairs Assistance 5: Supervision   had to use both rails and right knee demonstrated some instability   Stairs Assistance Details (indicate cue type and reason) some instability noted at right especially with descent, first time patient performed backing down the stairs    Stair Management Technique Two rails;Alternating pattern    Number of Stairs 8    Height of Stairs 6      Functional Gait  Assessment   Gait assessed  Yes    Gait Level Surface Walks 20 ft in less than 5.5 sec, no assistive devices, good speed, no evidence for imbalance, normal gait pattern, deviates no more than 6 in outside of the 12 in walkway width.    Change in Gait Speed Able to change speed, demonstrates mild gait deviations, deviates 6-10 in outside of the 12 in walkway width, or no gait deviations, unable to achieve a major change in velocity, or uses a change in velocity, or uses an assistive device.    Gait with Horizontal Head Turns Performs head turns with moderate changes in gait velocity, slows down, deviates 10-15 in outside 12 in walkway width but recovers, can continue to walk.    Gait with Vertical Head Turns Performs task with slight change in gait velocity (eg, minor disruption to smooth gait path), deviates 6 - 10 in outside 12 in walkway width or uses assistive device    Gait and Pivot Turn Pivot turns safely in greater than 3 sec and stops with no loss of balance, or pivot turns safely within 3 sec and stops with mild imbalance, requires small steps to catch balance.    Step Over Obstacle Is able to step over one shoe box (4.5 in total height) without changing gait speed. No  evidence of imbalance.    Gait with Narrow Base of Support Ambulates 4-7 steps.    Gait with Eyes Closed Walks 20 ft, slow speed, abnormal gait pattern, evidence for imbalance, deviates 10-15 in outside 12 in walkway width. Requires more than 9 sec to ambulate 20 ft.    Ambulating Backwards Walks 20 ft, uses assistive device, slower speed, mild gait deviations, deviates 6-10 in outside 12 in walkway width.    Steps Alternating feet, must use rail.    Total Score 18                        Objective measurements completed on examination: See above findings.                PT Education - 06/01/21 1125     Education Details Pt was educated on POC    Person(s) Educated Patient    Methods Explanation    Comprehension Verbalized understanding              PT Short Term Goals - 06/01/21 1157       PT SHORT TERM GOAL #1   Title Pt will be independent with  HEP to facilitate  RLE strength and balance to progress to gym based program.    Time 4    Period Weeks    Status New    Target Date 06/29/21      PT SHORT TERM GOAL #2   Title Pt will be able to ascend/descend 5 steps independently w/ use of rail and reciprocal pattern demonstraiting improved functional mobility to allow for safe entry/egress from home.    Time 4    Period Weeks    Status New    Target Date 06/29/21               PT Long Term Goals - 06/01/21 1146       PT LONG TERM GOAL #1   Title Pt will improve FGA score to >/= 25/30 demonstrating a low fall risk to improve community/household ambulation.(LTG-07/27/21)    Baseline 06/01/21- 18/30    Time 8    Period Weeks    Status New    Target Date 07/27/21      PT LONG TERM GOAL #2   Title Pt will demonstrate >/= 4/5 strength in RLE demonstrating improved strength to faciltate pt's functional mobility.    Period Weeks    Status New    Target Date 07/27/21      PT LONG TERM GOAL #3   Title Pt will be able to ascend/descend a  flight of stair with the use of one handrail mod I with reciprocal pattern to facilitate improved community ambulation and improved functional strength.    Time 8    Period Weeks    Status New    Target Date 07/27/21      PT LONG TERM GOAL #4   Title Pt will be able to ambulate >1017ft safely Ind on varied surfaces safely to improve community ambulation.    Time 8    Period Weeks    Status New    Target Date 07/27/21                    Plan - 06/01/21 1127     Clinical Impression Statement Pt presents to the clinic MS suspected in his mid 46's, worsened in mid 2021. Pt also with Asperger syndrome and biplolar disease, HTN. In 2012, he had MRI of brain and was found to have multiple T2/FLAIR hyerintensive foci. Over last year, pt began having more difficulty with Rt hand coordination and Rt foot drop. Had fall last year hitting head, no LOC. Pt  ambulates without AD but presents with RLE instability at knee. Pt had to be cued to take things more slowly as he has a tendency to move impulsively and quickly. Pt scored a 18/30 on FGA demontstrating a high fall risk. Pt had decreased strength in RLE most notably at right hamstring 3+/5. This pt would benefit from skilled PT to address the deficits stated and facilitated BLE strength and return to higher quality ambulation.    Personal Factors and Comorbidities Comorbidity 3+;Time since onset of injury/illness/exacerbation    Comorbidities MS, Asperger's, Bipolar Disease, HTN    Examination-Activity Limitations Stand;Stairs;Squat;Reach Overhead;Locomotion Level;Transfers    Examination-Participation Restrictions Community Activity;Yard Work;Cleaning    Stability/Clinical Decision Making Evolving/Moderate complexity    Clinical Decision Making Moderate    Rehab Potential Good    PT Frequency 1x / week   plus eval   PT Duration 8 weeks    PT Treatment/Interventions ADLs/Self Care Home Management;Neuromuscular re-education;Manual  techniques;Balance training;Therapeutic exercise;Therapeutic activities;DME Instruction;Functional mobility training;Stair training;Gait training;Patient/family  education;Orthotic Fit/Training;Passive range of motion    PT Next Visit Plan Next visit we will start with activities to strengthen R hamstring and work on gait/balance activities. Pt goes to gym so see what things may be appropriate for there.    PT Home Exercise Plan Pt will be provided with and educated on HEP next visit    Consulted and Agree with Plan of Care Patient             Patient will benefit from skilled therapeutic intervention in order to improve the following deficits and impairments:  Abnormal gait, Decreased coordination, Decreased mobility, Decreased strength, Decreased balance, Decreased safety awareness  Visit Diagnosis: Muscle weakness (generalized)  Other abnormalities of gait and mobility  History of falling  Unsteadiness on feet  Other lack of coordination     Problem List Patient Active Problem List   Diagnosis Date Noted   Slurred speech 02/26/2021   Autism 12/20/2020   Multiple sclerosis ([place]) 10/11/2020   MRI of brain abnormal 10/11/2020   Right sided weakness 10/11/2020   Gait disturbance 10/11/2020   Asperger syndrome 03/19/2013    Electa Sniff, PT, DPT, NCS 06/01/2021, 1:15 PM  [facility] [address] [place], [place], [address] Phone: [phone]   Fax:  [phone]  Name: George [name] MRN: [id number] Date of Birth: November 17, 1970

## 2021-06-04 ENCOUNTER — Telehealth: Payer: Self-pay | Admitting: Neurology

## 2021-06-04 NOTE — Telephone Encounter (Signed)
Pt called needing to speak to RN. Pt states that he was advised to stay away from heat and cold and he is needing to discuss with RN. Please advise.

## 2021-06-04 NOTE — Telephone Encounter (Signed)
Called the patient back. Advised that cold temperatures are ok. Advised the patient that you want to avoid over heating or getting to hot because it can cause exacerbations in the MS symptoms. Pt verbalized understanding. Pt had no questions at this time but was encouraged to call back if questions arise.

## 2021-06-07 ENCOUNTER — Encounter: Payer: Self-pay | Admitting: Occupational Therapy

## 2021-06-07 ENCOUNTER — Ambulatory Visit: Payer: PPO | Attending: Neurology | Admitting: Occupational Therapy

## 2021-06-07 ENCOUNTER — Other Ambulatory Visit: Payer: Self-pay

## 2021-06-07 DIAGNOSIS — R41840 Attention and concentration deficit: Secondary | ICD-10-CM | POA: Insufficient documentation

## 2021-06-07 DIAGNOSIS — R2681 Unsteadiness on feet: Secondary | ICD-10-CM | POA: Insufficient documentation

## 2021-06-07 DIAGNOSIS — R1312 Dysphagia, oropharyngeal phase: Secondary | ICD-10-CM | POA: Diagnosis not present

## 2021-06-07 DIAGNOSIS — R2689 Other abnormalities of gait and mobility: Secondary | ICD-10-CM | POA: Insufficient documentation

## 2021-06-07 DIAGNOSIS — M6281 Muscle weakness (generalized): Secondary | ICD-10-CM | POA: Insufficient documentation

## 2021-06-07 DIAGNOSIS — R41844 Frontal lobe and executive function deficit: Secondary | ICD-10-CM | POA: Insufficient documentation

## 2021-06-07 DIAGNOSIS — R41842 Visuospatial deficit: Secondary | ICD-10-CM | POA: Diagnosis not present

## 2021-06-07 DIAGNOSIS — R278 Other lack of coordination: Secondary | ICD-10-CM | POA: Insufficient documentation

## 2021-06-07 NOTE — Therapy (Signed)
[facility] [address] [place], [place], [address] Phone: [phone]   Fax:  [phone]  Occupational Therapy Treatment  Patient Details  Name: George [name] MRN: [id number] Date of Birth: [birth year] Referring Provider (OT): Arlice Colt, MD   Encounter Date: 06/07/2021   OT End of Session - 06/07/21 1020     Visit Number 2    Number of Visits 9    Date for OT Re-Evaluation 07/27/21    Authorization Type [employer]   $30 copay   Authorization Time Period $30 copay, VL:MN    OT Start Time 1018    OT Stop Time 1100    OT Time Calculation (min) 42 min    Activity Tolerance Patient tolerated treatment well    Behavior During Therapy [facility] for tasks assessed/performed             Past Medical History:  Diagnosis Date   Anxiety    Asperger's syndrome    Autistic spectrum disorder    Asperger's (per H&P Olivia Clelland, PA)   Bipolar 1 disorder ([place])    "no mood swings in 20 years"  on medication   GERD (gastroesophageal reflux disease)    Head injury 2020   Hypertension    Hypothyroidism    IBS (irritable bowel syndrome)     Past Surgical History:  Procedure Laterality Date   NO PAST SURGERIES     RADIOLOGY WITH ANESTHESIA N/A 05/16/2020   Procedure: MRI BRAIN WITH AND WITHOUT CONTRAST;  Surgeon: Radiologist, Medication, MD;  Location: [facility];  Service: Radiology;  Laterality: N/A;   RADIOLOGY WITH ANESTHESIA N/A 08/29/2020   Procedure: MRI WITH ANESTHESIA CERVICAL SPINE WITH AND WITHOUT CONTRAST, THORACIC SPINE WITH AND WITHOUT CONTRAST;  Surgeon: Radiologist, Medication, MD;  Location: [facility];  Service: Radiology;  Laterality: N/A;   RADIOLOGY WITH ANESTHESIA N/A 03/15/2021   Procedure: MRI BRAIN WITH AND WITHOUT, CERIVAL WITH AND WITHOUT;  Surgeon: Radiologist, Medication, MD;  Location: [facility];  Service: Radiology;  Laterality: N/A;    There were no vitals filed for this visit.    Subjective Assessment - 06/07/21 1019     Subjective  "I was walking in my kitchen and this (RLE) leg gave out on me and I started to fall but caught myself" "I would like to be able to type again if I can"    Pertinent History MS suspected in mid 30's, worsened in mid-2021; experienced increased difficulty w/ Rt hand coordination and Rt foot drop. PMH also includes ASD (Asperger's syndrome), biplolar disorder, anxiety, and HTN    Limitations High fall risk    Currently in Pain? No/denies    Pain Score 0-No pain              Physioball forward reaching x 10 with min verbal and tactile cues for keeping shoulders down  Blocks stacking blocks with min difficulty and drops                  OT Education - 06/07/21 1026     Education Details Access Code: [email or web address] Supine Cane/Foam Roll Exercises, Coordination HEP    Person(s) Educated Patient    Methods Explanation;Handout;Demonstration    Comprehension Verbalized understanding;Returned demonstration;Need further instruction              OT Short Term Goals - 06/01/21 1003       OT SHORT TERM GOAL #1   Title Pt will be independent with intiial HEP  for RUE    Baseline Currently has HEP designed for coordination    Time 4    Period Weeks    Status New    Target Date 06/29/21      OT SHORT TERM GOAL #2   Title Pt will verbalize understanding of adapted strategies and equipment PRN for increasing safety and independence with ADLs and IADLs (i.e. cutting devices, fall prevention, shower seat, etc)    Baseline interested in more information re: shower chair    Time 4    Period Weeks    Status New      OT SHORT TERM GOAL #3   Title Pt will increase grip strength in RUE to 60 lbs or greater for increasing stability and functional use    Baseline R 55.9, L 96.7    Time 4    Period Weeks    Status New      OT SHORT TERM GOAL #4   Title Pt will obtain a lightweight object from mid level shelf with good body  mechanics and no compensatory movements and with pain less than or equal to 1/10.    Time 4    Period Weeks    Status New      OT SHORT TERM GOAL #5   Title Pt will increase fine motor coordination in RUE by completing 9 hole peg test in 70 seconds or less.    Baseline R 75s L 25s    Time 4    Period Weeks    Status New               OT Long Term Goals - 06/01/21 1012       OT LONG TERM GOAL #1   Title Pt will be independent with updated HEP.    Time 8    Period Weeks    Status New    Target Date 07/27/21      OT LONG TERM GOAL #2   Title Pt will achieve shoulder flexion of 150* or greater in RUE for increasing reach and decrease pain.    Baseline 130*    Time 8    Period Weeks    Status New      OT LONG TERM GOAL #3   Title Pt will improve grip strength in RUE to 65 lbs or greater for increase in stability and functional use in RUE.    Baseline R 55.9 lbs, L 96.7 lbs    Time 8    Period Weeks    Status New      OT LONG TERM GOAL #4   Title Pt will increase fine motor coordination in RUE and completing 9 hole peg test in 65 seconds or less.    Baseline R 75s L 25s    Time 8    Period Weeks    Status New                   Plan - 06/07/21 1023     Clinical Impression Statement Pt verbalized understanding and agreement with goals set at evaluation. Pt reports wanting to work on typing.    OT Occupational Profile and History Detailed Assessment- Review of Records and additional review of physical, cognitive, psychosocial history related to current functional performance    Occupational performance deficits (Please refer to evaluation for details): ADL's;IADL's;Work;Leisure;Social Participation    Body Structure / Function / Physical Skills ADL;Strength;Decreased knowledge of use of DME;Tone;Dexterity;Balance;Body mechanics;Proprioception;UE functional use;IADL;ROM;Coordination;Mobility;FMC;Decreased knowledge of precautions;Gait;GMC;Endurance  Cognitive Skills Attention;Safety Awareness;Problem Solve    Psychosocial Skills Interpersonal Interaction;Environmental  Adaptations    Rehab Potential Fair   Decreased awareness of deficits and reported min carryover of learned strategies   Clinical Decision Making Several treatment options, min-mod task modification necessary    Comorbidities Affecting Occupational Performance: Presence of comorbidities impacting occupational performance    Comorbidities impacting occupational performance description: ASD, bipolar disorder    Modification or Assistance to Complete Evaluation  Min-Moderate modification of tasks or assist with assess necessary to complete eval    OT Frequency 1x / week    OT Duration 8 weeks    OT Treatment/Interventions Self-care/ADL training;DME and/or AE instruction;Splinting;Therapeutic activities;Psychosocial skills training;Therapeutic exercise;Coping strategies training;Functional Mobility Training;Neuromuscular education;Passive range of motion;Visual/perceptual remediation/compensation;Patient/family education;Energy conservation;Manual Therapy;Balance training    Plan Scapula stabilization, closed-chain AROM    Recommended Other Services Currently receiving PT at this site    Consulted and Agree with Plan of Care Patient             Patient will benefit from skilled therapeutic intervention in order to improve the following deficits and impairments:   Body Structure / Function / Physical Skills: ADL, Strength, Decreased knowledge of use of DME, Tone, Dexterity, Balance, Body mechanics, Proprioception, UE functional use, IADL, ROM, Coordination, Mobility, FMC, Decreased knowledge of precautions, Gait, GMC, Endurance Cognitive Skills: Attention, Safety Awareness, Problem Solve Psychosocial Skills: Interpersonal Interaction, Environmental  Adaptations   Visit Diagnosis: Muscle weakness (generalized)  Attention and concentration deficit  Unsteadiness on  feet  Other lack of coordination  Visuospatial deficit  Frontal lobe and executive function deficit    Problem List Patient Active Problem List   Diagnosis Date Noted   Slurred speech 02/26/2021   Autism 12/20/2020   Multiple sclerosis ([place]) 10/11/2020   MRI of brain abnormal 10/11/2020   Right sided weakness 10/11/2020   Gait disturbance 10/11/2020   Asperger syndrome 03/19/2013    Zachery Conch, OT/L 06/07/2021, 11:05 AM  [facility] [address] [place], [place], [address] Phone: [phone]   Fax:  [phone]  Name: YONIS CARREON MRN: [id number] Date of Birth: 1970/11/07

## 2021-06-07 NOTE — Patient Instructions (Addendum)
Access Code: [email or web address] URL: [email or web address] Date: 06/07/2021 Prepared by: Waldo Laine  Exercises Supine Shoulder Flexion Extension AAROM with Dowel - 1 x daily - 7 x weekly - 3 sets - 10 reps Supine Shoulder Press with Dowel - 1 x daily - 7 x weekly - 3 sets - 10 reps Supine Shoulder Horizontal Abduction Adduction AAROM with Dowel - 1 x daily - 7 x weekly - 3 sets - 10 reps   Basic Activities:    Use your affected hand to perform the following activities for 20-30 minutes 1-2 times/day.  Stop activity if you experience pain.   - Flip playing cards - Deal top card with thumb - Toss ball - Rotate ball in your hand  - Turn doorknob - Open/close cabinet door with handle - Pick up coins and place in a container - Stack coins (stacks of 5) and manipulate one at a time to fingertips to place in bank/container - Fold towels - Stack blocks - Pick up 1-inch blocks - Put empty clothes hangers on a rack, remove, and repeat

## 2021-06-11 ENCOUNTER — Telehealth: Payer: Self-pay | Admitting: Neurology

## 2021-06-11 DIAGNOSIS — R4781 Slurred speech: Secondary | ICD-10-CM

## 2021-06-11 DIAGNOSIS — G35 Multiple sclerosis: Secondary | ICD-10-CM

## 2021-06-11 NOTE — Telephone Encounter (Signed)
Called the pt to let him know I will place the order for him to ST at neuro rehab. Pt verbalized understanding and was appreciative for the call back.

## 2021-06-11 NOTE — Telephone Encounter (Signed)
Pt is asking if another order can be placed for him to have speech therapy, please call.

## 2021-06-14 ENCOUNTER — Telehealth: Payer: Self-pay | Admitting: Neurology

## 2021-06-14 NOTE — Telephone Encounter (Signed)
Pt is asking for a call with the name of the new medication Dr Felecia Shelling wants him to try.  Pt wants the name so he can ask his psychiatrist if it is ok to take with other medications

## 2021-06-14 NOTE — Telephone Encounter (Signed)
Called the patient back and what he is referring to is the med that was discussed armodafinil/modafinil. He wrote those medications down and will discuss with psychiatry

## 2021-06-15 ENCOUNTER — Ambulatory Visit: Payer: PPO | Admitting: Occupational Therapy

## 2021-06-15 ENCOUNTER — Encounter: Payer: Self-pay | Admitting: Occupational Therapy

## 2021-06-15 ENCOUNTER — Ambulatory Visit: Payer: PPO

## 2021-06-15 ENCOUNTER — Other Ambulatory Visit: Payer: Self-pay

## 2021-06-15 DIAGNOSIS — M6281 Muscle weakness (generalized): Secondary | ICD-10-CM | POA: Diagnosis not present

## 2021-06-15 DIAGNOSIS — R2689 Other abnormalities of gait and mobility: Secondary | ICD-10-CM

## 2021-06-15 DIAGNOSIS — R41842 Visuospatial deficit: Secondary | ICD-10-CM

## 2021-06-15 DIAGNOSIS — R278 Other lack of coordination: Secondary | ICD-10-CM

## 2021-06-15 DIAGNOSIS — R41840 Attention and concentration deficit: Secondary | ICD-10-CM

## 2021-06-15 DIAGNOSIS — R2681 Unsteadiness on feet: Secondary | ICD-10-CM

## 2021-06-15 DIAGNOSIS — R41844 Frontal lobe and executive function deficit: Secondary | ICD-10-CM

## 2021-06-15 NOTE — Therapy (Signed)
[facility] [address] [place], [place], [address] Phone: [phone]   Fax:  [phone]  Occupational Therapy Treatment  Patient Details  Name: George [name] MRN: [id number] Date of Birth: 05/08/1971 Referring Provider (OT): Arlice Colt, MD   Encounter Date: 06/15/2021   OT End of Session - 06/15/21 1022     Visit Number 3    Number of Visits 9    Date for OT Re-Evaluation 07/27/21    Authorization Type [employer]   $30 copay   Authorization Time Period $30 copay, VL:MN    OT Start Time 1019    OT Stop Time 1100    OT Time Calculation (min) 41 min    Activity Tolerance Patient tolerated treatment well    Behavior During Therapy [facility] for tasks assessed/performed             Past Medical History:  Diagnosis Date   Anxiety    Asperger's syndrome    Autistic spectrum disorder    Asperger's (per H&P Olivia Clelland, PA)   Bipolar 1 disorder ([place])    "no mood swings in 20 years"  on medication   GERD (gastroesophageal reflux disease)    Head injury 2020   Hypertension    Hypothyroidism    IBS (irritable bowel syndrome)     Past Surgical History:  Procedure Laterality Date   NO PAST SURGERIES     RADIOLOGY WITH ANESTHESIA N/A 05/16/2020   Procedure: MRI BRAIN WITH AND WITHOUT CONTRAST;  Surgeon: Radiologist, Medication, MD;  Location: [facility];  Service: Radiology;  Laterality: N/A;   RADIOLOGY WITH ANESTHESIA N/A 08/29/2020   Procedure: MRI WITH ANESTHESIA CERVICAL SPINE WITH AND WITHOUT CONTRAST, THORACIC SPINE WITH AND WITHOUT CONTRAST;  Surgeon: Radiologist, Medication, MD;  Location: [facility];  Service: Radiology;  Laterality: N/A;   RADIOLOGY WITH ANESTHESIA N/A 03/15/2021   Procedure: MRI BRAIN WITH AND WITHOUT, CERIVAL WITH AND WITHOUT;  Surgeon: Radiologist, Medication, MD;  Location: [facility];  Service: Radiology;  Laterality: N/A;    There were no vitals filed for this visit.    Subjective Assessment - 06/15/21 1021     Subjective  "I wanna talk about the time I am here. I was here early and you were 5 minutes past my appt time"    Pertinent History MS suspected in mid 30's, worsened in mid-2021; experienced increased difficulty w/ Rt hand coordination and Rt foot drop. PMH also includes ASD (Asperger's syndrome), biplolar disorder, anxiety, and HTN    Limitations High fall risk    Currently in Pain? No/denies    Pain Score 0-No pain               Medium Pegs with RUE and copying pattern. Cues for holding elbow closer to body for increased stabilization. Pt got frustrated and responded well to stern instructions for stopping and taking a break when it gets frustrating as it only gets more difficulty with the frustration. Removed with RUE  Foam Roll on Wall x 5 reps on forearms, back to wall and shoulder flexion x 10   Hand Gripper: with RUE on level 3 with black spring. Pt picked up 1 inch blocks with gripper with moderate drops and mod/max difficulty.                      OT Short Term Goals - 06/15/21 1025       OT SHORT TERM GOAL #1  Title Pt will be independent with intiial HEP for RUE    Baseline Currently has HEP designed for coordination    Time 4    Period Weeks    Status On-going    Target Date 06/29/21      OT SHORT TERM GOAL #2   Title Pt will verbalize understanding of adapted strategies and equipment PRN for increasing safety and independence with ADLs and IADLs (i.e. cutting devices, fall prevention, shower seat, etc)    Baseline interested in more information re: shower chair    Time 4    Period Weeks    Status New      OT SHORT TERM GOAL #3   Title Pt will increase grip strength in RUE to 60 lbs or greater for increasing stability and functional use    Baseline R 55.9, L 96.7    Time 4    Period Weeks    Status On-going      OT SHORT TERM GOAL #4   Title Pt will obtain a lightweight object from mid level  shelf with good body mechanics and no compensatory movements and with pain less than or equal to 1/10.    Time 4    Period Weeks    Status New      OT SHORT TERM GOAL #5   Title Pt will increase fine motor coordination in RUE by completing 9 hole peg test in 70 seconds or less.    Baseline R 75s L 25s    Time 4    Period Weeks    Status On-going               OT Long Term Goals - 06/01/21 1012       OT LONG TERM GOAL #1   Title Pt will be independent with updated HEP.    Time 8    Period Weeks    Status New    Target Date 07/27/21      OT LONG TERM GOAL #2   Title Pt will achieve shoulder flexion of 150* or greater in RUE for increasing reach and decrease pain.    Baseline 130*    Time 8    Period Weeks    Status New      OT LONG TERM GOAL #3   Title Pt will improve grip strength in RUE to 65 lbs or greater for increase in stability and functional use in RUE.    Baseline R 55.9 lbs, L 96.7 lbs    Time 8    Period Weeks    Status New      OT LONG TERM GOAL #4   Title Pt will increase fine motor coordination in RUE and completing 9 hole peg test in 65 seconds or less.    Baseline R 75s L 25s    Time 8    Period Weeks    Status New                    Patient will benefit from skilled therapeutic intervention in order to improve the following deficits and impairments:           Visit Diagnosis: Muscle weakness (generalized)  Unsteadiness on feet  Other lack of coordination  Attention and concentration deficit  Visuospatial deficit  Frontal lobe and executive function deficit  Other abnormalities of gait and mobility    Problem List Patient Active Problem List   Diagnosis Date Noted   Slurred speech 02/26/2021  Autism 12/20/2020   Multiple sclerosis ([place]) 10/11/2020   MRI of brain abnormal 10/11/2020   Right sided weakness 10/11/2020   Gait disturbance 10/11/2020   Asperger syndrome 03/19/2013    Zachery Conch,  OT/L 06/15/2021, 10:49 AM  [facility] [address] [place], [place], [address] Phone: [phone]   Fax:  [phone]  Name: George [name] MRN: [id number] Date of Birth: [birth year]

## 2021-06-15 NOTE — Patient Instructions (Signed)
Access Code: [email or web address] URL: [email or web address] Date: 06/15/2021 Prepared by: Baldomero Lamy  Exercises Heel Toe Raises with Counter Support - 1 x daily - 5 x weekly - 2 sets - 10 reps Standing Hamstring Curl with Chair Support - 1 x daily - 5 x weekly - 2 sets - 10 reps Forward Step Up - 1 x daily - 5 x weekly - 2 sets - 10 reps Romberg Stance on Foam Pad with Head Rotation - 1 x daily - 5 x weekly - 2 sets - 10 reps Romberg Stance with Head Nods on Foam Pad - 1 x daily - 5 x weekly - 3 sets - 10 reps Standing Balance with Eyes Closed on Foam - 1 x daily - 5 x weekly - 1 sets - 3 reps - 30 seconds hold

## 2021-06-15 NOTE — Therapy (Signed)
[facility] [address] [place], [place], [address] Phone: [phone]   Fax:  [phone]  Physical Therapy Treatment  Patient Details  Name: George [name] MRN: [id number] Date of Birth: [birth year] Referring Provider (PT): Arlice Colt, MD   Encounter Date: 06/15/2021   PT End of Session - 06/15/21 1105     Visit Number 2    Number of Visits 9    Date for PT Re-Evaluation 07/27/21    Authorization Type [employer]    PT Start Time 1102    PT Stop Time 1143    PT Time Calculation (min) 41 min    Equipment Utilized During Treatment Gait belt    Activity Tolerance Patient tolerated treatment well    Behavior During Therapy Impulsive             Past Medical History:  Diagnosis Date   Anxiety    Asperger's syndrome    Autistic spectrum disorder    Asperger's (per H&P Olivia Clelland, PA)   Bipolar 1 disorder ([place])    "no mood swings in 20 years"  on medication   GERD (gastroesophageal reflux disease)    Head injury 2020   Hypertension    Hypothyroidism    IBS (irritable bowel syndrome)     Past Surgical History:  Procedure Laterality Date   NO PAST SURGERIES     RADIOLOGY WITH ANESTHESIA N/A 05/16/2020   Procedure: MRI BRAIN WITH AND WITHOUT CONTRAST;  Surgeon: Radiologist, Medication, MD;  Location: [facility];  Service: Radiology;  Laterality: N/A;   RADIOLOGY WITH ANESTHESIA N/A 08/29/2020   Procedure: MRI WITH ANESTHESIA CERVICAL SPINE WITH AND WITHOUT CONTRAST, THORACIC SPINE WITH AND WITHOUT CONTRAST;  Surgeon: Radiologist, Medication, MD;  Location: [facility];  Service: Radiology;  Laterality: N/A;   RADIOLOGY WITH ANESTHESIA N/A 03/15/2021   Procedure: MRI BRAIN WITH AND WITHOUT, CERIVAL WITH AND WITHOUT;  Surgeon: Radiologist, Medication, MD;  Location: [facility];  Service: Radiology;  Laterality: N/A;    There were no vitals filed for this visit.   Subjective Assessment - 06/15/21 1106      Subjective Patient reports he is noticing that the R knee wants to hyperextend on him at times. He reports he tries to compensate by walking with  knees flexed to avoid it extended, especially on the R side. No falls, but reports one stumble due to the leg wanting to buckle.    Pertinent History h/o Asperger syndrome and bipolar disease    Limitations Standing;Walking;House hold activities    Patient Stated Goals Pt would like to be able to walk straight and live a long life.    Currently in Pain? No/denies    Pain Onset More than a month ago                 [facility] Adult PT Treatment/Exercise - 06/15/21 0001       Transfers   Transfers Sit to Stand;Stand to Sit    Sit to Stand 7: Independent    Stand to Sit 7: Independent      Ambulation/Gait   Ambulation/Gait Yes    Ambulation/Gait Assistance 5: Supervision    Ambulation/Gait Assistance Details completed ambulation single trekking pole on L x 400 ft to work on improved sequencing. paitent still demo mild recurvatum on R side with cues for slight knee bend however patient demo increased knee bend.    Ambulation Distance (Feet) 400 Feet    Assistive device --  single walking stick   Gait Pattern Decreased arm swing - right;Decreased arm swing - left;Step-through pattern;Decreased weight shift to right;Decreased stance time - right    Ambulation Surface Level;Indoor      Exercises   Exercises Other Exercises    Other Exercises  completed heel/toe raises x 15 reps, with cues for form and education on proper completion at home. Then completed hamstring curl bilat x 15 reps, increased challenge with RLE noted, cues for control.      Knee/Hip Exercises: Standing   Forward Step Up Both;1 set;15 reps;Hand Hold: 1;Step Height: 6"    Forward Step Up Limitations progressed from bilat UE support to single UE support. cues for form.               Balance Exercises - 06/15/21 0001       Balance Exercises: Standing   Standing  Eyes Opened Narrow base of support (BOS);Head turns;Foam/compliant surface;Limitations    Standing Eyes Opened Limitations horizontal/veritcal head turns x 10 reps each.    Standing Eyes Closed Wide (BOA);Foam/compliant surface;3 reps;30 secs;Limitations    Standing Eyes Closed Limitations 3 x 30 seconds on airex.    SLS with Vectors Foam/compliant surface;Intermittent upper extremity assist;Limitations    SLS with Vectors Limitations standing on airex completed alternating toe taps to cones x 15 reps, progressing from BUE support to single UE support.             Access Code: [email or web address] URL: [email or web address] Date: 06/15/2021 Prepared by: Baldomero Lamy   Exercises Heel Toe Raises with Counter Support - 1 x daily - 5 x weekly - 2 sets - 10 reps Standing Hamstring Curl with Chair Support - 1 x daily - 5 x weekly - 2 sets - 10 reps Forward Step Up - 1 x daily - 5 x weekly - 2 sets - 10 reps Romberg Stance on Foam Pad with Head Rotation - 1 x daily - 5 x weekly - 2 sets - 10 reps Romberg Stance with Head Nods on Foam Pad - 1 x daily - 5 x weekly - 3 sets - 10 reps Standing Balance with Eyes Closed on Foam - 1 x daily - 5 x weekly - 1 sets - 3 reps - 30 seconds hold   PT Education - 06/15/21 1201     Education Details HEP Provided    Person(s) Educated Patient    Methods Explanation;Demonstration;Handout    Comprehension Verbalized understanding;Returned demonstration              PT Short Term Goals - 06/01/21 1157       PT SHORT TERM GOAL #1   Title Pt will be independent with  HEP to facilitate RLE strength and balance to progress to gym based program.    Time 4    Period Weeks    Status New    Target Date 06/29/21      PT SHORT TERM GOAL #2   Title Pt will be able to ascend/descend 5 steps independently w/ use of rail and reciprocal pattern demonstraiting improved functional mobility to allow for safe entry/egress from home.    Time 4    Period  Weeks    Status New    Target Date 06/29/21               PT Long Term Goals - 06/01/21 1146       PT LONG TERM GOAL #1   Title Pt will improve FGA score to >/=  25/30 demonstrating a low fall risk to improve community/household ambulation.(LTG-07/27/21)    Baseline 06/01/21- 18/30    Time 8    Period Weeks    Status New    Target Date 07/27/21      PT LONG TERM GOAL #2   Title Pt will demonstrate >/= 4/5 strength in RLE demonstrating improved strength to faciltate pt's functional mobility.    Period Weeks    Status New    Target Date 07/27/21      PT LONG TERM GOAL #3   Title Pt will be able to ascend/descend a flight of stair with the use of one handrail mod I with reciprocal pattern to facilitate improved community ambulation and improved functional strength.    Time 8    Period Weeks    Status New    Target Date 07/27/21      PT LONG TERM GOAL #4   Title Pt will be able to ambulate >1041ft safely Ind on varied surfaces safely to improve community ambulation.    Time 8    Period Weeks    Status New    Target Date 07/27/21                   Plan - 06/15/21 1201     Clinical Impression Statement Today's skilled PT session included ambulation w/ single walking stick working on sequencing, with patient tolerating well. Mild recurvatum noted on RLE. PT educating to bring personal into next session for ambulation outdoors. Rest of session spent establishing initial HEP focused on RLE strengthening and balance. Will continue to progress toward all LTGs.    Personal Factors and Comorbidities Comorbidity 3+;Time since onset of injury/illness/exacerbation    Comorbidities MS, Asperger's, Bipolar Disease, HTN    Examination-Activity Limitations Stand;Stairs;Squat;Reach Overhead;Locomotion Level;Transfers    Examination-Participation Restrictions Community Activity;Yard Work;Cleaning    Stability/Clinical Decision Making Evolving/Moderate complexity    Rehab  Potential Good    PT Frequency 1x / week   plus eval   PT Duration 8 weeks    PT Treatment/Interventions ADLs/Self Care Home Management;Neuromuscular re-education;Manual techniques;Balance training;Therapeutic exercise;Therapeutic activities;DME Instruction;Functional mobility training;Stair training;Gait training;Patient/family education;Orthotic Fit/Training;Passive range of motion    PT Next Visit Plan how was HEP? trial nustep/scifit for RLE. Pt goes to gym so see what things may be appropriate for there.    PT Home Exercise Plan Pt will be provided with and educated on HEP next visit    Consulted and Agree with Plan of Care Patient             Patient will benefit from skilled therapeutic intervention in order to improve the following deficits and impairments:  Abnormal gait, Decreased coordination, Decreased mobility, Decreased strength, Decreased balance, Decreased safety awareness  Visit Diagnosis: Muscle weakness (generalized)  Unsteadiness on feet  Other abnormalities of gait and mobility     Problem List Patient Active Problem List   Diagnosis Date Noted   Slurred speech 02/26/2021   Autism 12/20/2020   Multiple sclerosis ([place]) 10/11/2020   MRI of brain abnormal 10/11/2020   Right sided weakness 10/11/2020   Gait disturbance 10/11/2020   Asperger syndrome 03/19/2013    Jones Bales, PT, DPT 06/15/2021, 12:04 PM  [facility] [address] [place], [place], [address] Phone: [phone]   Fax:  [phone]  Name: George [name] MRN: [id number] Date of Birth: 1971/02/15

## 2021-06-18 DIAGNOSIS — E291 Testicular hypofunction: Secondary | ICD-10-CM | POA: Diagnosis not present

## 2021-06-20 ENCOUNTER — Encounter: Payer: Self-pay | Admitting: Speech Pathology

## 2021-06-20 ENCOUNTER — Ambulatory Visit: Payer: PPO | Admitting: Speech Pathology

## 2021-06-20 ENCOUNTER — Other Ambulatory Visit: Payer: Self-pay

## 2021-06-20 ENCOUNTER — Telehealth: Payer: Self-pay | Admitting: Speech Pathology

## 2021-06-20 ENCOUNTER — Encounter: Payer: Self-pay | Admitting: Neurology

## 2021-06-20 ENCOUNTER — Ambulatory Visit: Payer: PPO | Admitting: Neurology

## 2021-06-20 VITALS — BP 152/88 | HR 80 | Ht 68.0 in | Wt 156.0 lb

## 2021-06-20 DIAGNOSIS — G35 Multiple sclerosis: Secondary | ICD-10-CM

## 2021-06-20 DIAGNOSIS — R1310 Dysphagia, unspecified: Secondary | ICD-10-CM

## 2021-06-20 DIAGNOSIS — F319 Bipolar disorder, unspecified: Secondary | ICD-10-CM | POA: Diagnosis not present

## 2021-06-20 DIAGNOSIS — F845 Asperger's syndrome: Secondary | ICD-10-CM | POA: Diagnosis not present

## 2021-06-20 DIAGNOSIS — R1312 Dysphagia, oropharyngeal phase: Secondary | ICD-10-CM

## 2021-06-20 DIAGNOSIS — M6281 Muscle weakness (generalized): Secondary | ICD-10-CM | POA: Diagnosis not present

## 2021-06-20 NOTE — Telephone Encounter (Signed)
Pt reported instance of choking where he felt food blocked his airway for ~10 seconds.  SLP rec referral for Modified Barium Swallow.  Thank you, Royston Sinner MS, CCC-SLP, CBIS   **Please have your office or the patient contact the Acute Rehab at [phone] to schedule an appointment.  Once this has been completed, we will be able to schedule an appointment for the patient for further ST services if necessary.  If you have any questions and or need further assistance, please let us know.  Thank you.

## 2021-06-20 NOTE — Progress Notes (Signed)
[facility]  PATIENT: George [name] DOB: 1971/05/15  REFERRING DOCTOR OR PCP: PCP is Lavone Orn.  Patient has seen Dr. Leta Baptist SOURCE: Patient, mother, notes from Dr. Leta Baptist, MRI imaging reports and lab reports.  MRI images personally reviewed  _________________________________   HISTORICAL  CHIEF COMPLAINT:  Chief Complaint  Patient presents with   Follow-up    Pt alone, rm 10. Presents today to discuss starting a new medication and safety of using that with his other meds. Pt is working with PT/OT     HISTORY OF PRESENT ILLNESS:  Mr. George [name] is a 50 y.o. man withrelapsing remitting multiple sclerosis.  Update  02/26/2021 Yesterday, while eating a burger, he choked and couldn't breath for a few minutes.     He has fatigue and has trouble keeping a schedule.   He notes waking up at 2 in the morning and then not falling asleep for a few hours.   He goes to bed at 8 pm to 9 pm   He has bipolar disease and is currently on Depakote.   He has mood swings and is quick to get angry.     He started glatiramer acetate for the MS earlier this year and he tolerates it well systemically but has skin reactions with nodules lasting 2 weeks.  He denies any exacerbations.    He had wanted to change to a differnet DMT after a large painful nodule a couple weeks ago but after mor ethought would like to remian on glatiramer as concerned about Aubagio and interferon liver issues and DMF GI issues (has irrritable bowel).     Neurologically, gait is mildly unbalanced but improved from a couple months ago.  He stumbles but has no falls over the last few weeks.  Sometimes the right foot will drag when he walks.  He denies any significant numbness.  Vision is fine.  Bladder function is usually good though he has some urgency with use of caffeine.  He notes some fatigue and.  He sleeps well most nights.    He notes some fatigue but does better with B12 supplements.   Vit D was  low and he is advised to take 2000 U daily   MS HISTORY: He has a h/o Asperger syndrome and bipolar disease.   In 2012, he had an MRI of the brain and was found to have multiple T2/FLAIR hyperintense foci in the brain.   A lumbar puncture at the time was negative.    Over the last year, he began to have more difficulty with right hand coordination and left foot drop.    Also the right side of his face was drooping some.     He came in to see Dr. Leta Baptist and had repeat imaging studies showing progression of the white matter foci and the appearance of a small focus at C5.    Repeat LP, however, showed negative CSF (no OCB) again.   He started glatiramer 10/2020.    OTHER MEDICAL HISTORY He has bipolar disease (sees Dr. Toy Care).  He also has autism.   He is on Depakote.   He notes being more emotional and easily gets overwhelmed with emotions.   This had happened when he was younger but then got better again.      He has irritable bowel syndrome and takes B12 since he was told absorption may be affected.   B12 level was fine.     Imaging studies reviewed: MRI of the brain  11/21/2010 shows multiple T2/FLAIR hyperintense foci including a focus in the left pons, middle cerebellar peduncle, right thalamus.  Some foci of periventricular and some foci are juxtacortical.  None of the foci enhanced.  MRI of the brain 05/16/2020 showed a similar pattern at the 2012 MRI but there has been mild progression with at least 2 new lesions noted in the white matter compared to the previous MRI.  As seen before there is a large focus involving the left greater than right pons.  It may be slightly larger.  There is also involvement of the middle cerebellar peduncle which is stable on the right but a new focus on the left.  There is one stable focus in the right thalamus.  MRI of the cervical spine 08/29/2020 showed a small focus posteriorly to the right adjacent to C5.  Mild DJD at C5-C6 not causing nerve root  compression.  MRI of the thoracic spine 08/29/2020 showed a normal spinal cord (subtle focus at T10-T11 more likely artifact).  There are cystic lesions within the kidneys  MRI of the brain 03/15/2021 and it was unchanged.   No new foci.  MRI of the cervical spine 03/15/2021 showed a focus to the right at [facility].  Laboratory results. ANA, ANCA, HIV, hep B antibodies, hep C antibody were all negative or normal.  Vitamin D was mildly low.  Vitamin B12 was fine (elevated), creatinine was mildly elevated.  JCV antibody was positive at 1.3.  There were no oligoclonal bands in the CSF.  Protein was mildly elevated at 54.  REVIEW OF SYSTEMS: Constitutional: No fevers, chills, sweats, or change in appetite Eyes: No visual changes, double vision, eye pain Ear, nose and throat: No hearing loss, ear pain, nasal congestion, sore throat Cardiovascular: No chest pain, palpitations Respiratory:  No shortness of breath at rest or with exertion.   No wheezes GastrointestinaI: No nausea, vomiting, diarrhea, abdominal pain, fecal incontinence Genitourinary:  No dysuria, urinary retention or frequency.  No nocturia. Musculoskeletal:  No neck pain, back pain Integumentary: No rash, pruritus, skin lesions Neurological: as above Psychiatric: No depression at this time.  No anxiety Endocrine: No palpitations, diaphoresis, change in appetite, change in weigh or increased thirst Hematologic/Lymphatic:  No anemia, purpura, petechiae. Allergic/Immunologic: No itchy/runny eyes, nasal congestion, recent allergic reactions, rashes  ALLERGIES: Allergies  Allergen Reactions   Other Other (See Comments)    Can only eat at home    HOME MEDICATIONS:  Current Outpatient Medications:    amLODipine-valsartan (EXFORGE) 5-160 MG tablet, Take 1 tablet by mouth in the morning., Disp: , Rfl:    busPIRone (BUSPAR) 10 MG tablet, Take 20 mg by mouth 2 (two) times daily., Disp: , Rfl:    Cholecalciferol (VITAMIN D3) 50 MCG (2000  UT) TABS, Take 2,000 Units by mouth in the morning., Disp: , Rfl:    Cyanocobalamin (VITAMIN B-12) 5000 MCG TBDP, Place 5,000 Units under the tongue 2 (two) times a week., Disp: , Rfl:    divalproex (DEPAKOTE) 250 MG DR tablet, Take 1,250 mg by mouth daily. , Disp: , Rfl:    Glatiramer Acetate (COPAXONE) 40 MG/ML SOSY, Inject 40 mg into the skin 3 (three) times a week. (Patient taking differently: Inject 40 mg into the skin every other day.), Disp: 11.76 mL, Rfl: 12   Multiple Vitamin (MULTIVITAMIN WITH MINERALS) TABS tablet, Take 1 tablet by mouth in the morning., Disp: , Rfl:    SYNTHROID 100 MCG tablet, Take 100 mcg by mouth daily before breakfast. ,  Disp: , Rfl:    testosterone enanthate (DELATESTRYL) 200 MG/ML injection, Inject into the muscle every 21 ( twenty-one) days. For IM use only  Every three weeks, Disp: , Rfl:   PAST MEDICAL HISTORY: Past Medical History:  Diagnosis Date   Anxiety    Asperger's syndrome    Autistic spectrum disorder    Asperger's (per H&P Olivia Clelland, PA)   Bipolar 1 disorder ([place])    "no mood swings in 20 years"  on medication   GERD (gastroesophageal reflux disease)    Head injury 2020   Hypertension    Hypothyroidism    IBS (irritable bowel syndrome)     PAST SURGICAL HISTORY: Past Surgical History:  Procedure Laterality Date   NO PAST SURGERIES     RADIOLOGY WITH ANESTHESIA N/A 05/16/2020   Procedure: MRI BRAIN WITH AND WITHOUT CONTRAST;  Surgeon: Radiologist, Medication, MD;  Location: [facility];  Service: Radiology;  Laterality: N/A;   RADIOLOGY WITH ANESTHESIA N/A 08/29/2020   Procedure: MRI WITH ANESTHESIA CERVICAL SPINE WITH AND WITHOUT CONTRAST, THORACIC SPINE WITH AND WITHOUT CONTRAST;  Surgeon: Radiologist, Medication, MD;  Location: [facility];  Service: Radiology;  Laterality: N/A;   RADIOLOGY WITH ANESTHESIA N/A 03/15/2021   Procedure: MRI BRAIN WITH AND WITHOUT, CERIVAL WITH AND WITHOUT;  Surgeon: Radiologist, Medication, MD;  Location: [facility];   Service: Radiology;  Laterality: N/A;    FAMILY HISTORY: Family History  Problem Relation Age of Onset   Hypertension Father    Depression Sister    Hypertension Paternal Grandfather     SOCIAL HISTORY:  Social History   Socioeconomic History   Marital status: Single    Spouse name: Not on file   Number of children: 0   Years of education: college   Highest education level: Not on file  Occupational History    Comment: artist  Tobacco Use   Smoking status: Never   Smokeless tobacco: Never  Vaping Use   Vaping Use: Never used  Substance and Sexual Activity   Alcohol use: No   Drug use: No   Sexual activity: Not on file  Other Topics Concern   Not on file  Social History Narrative   He lives at home.    He is single and has no children.     He denies use of tobacco, alcohol, illicit drugs and caffeine.    He works as an Training and development officer.    Oncologist: Not on [employer] Insecurity: Not on file  Transportation Needs: Not on file  Physical Activity: Not on file  Stress: Not on file  Social Connections: Not on file  Intimate Partner Violence: Not on file     PHYSICAL EXAM  Vitals:   06/20/21 1253  BP: (!) 152/88  Pulse: 80  Weight: 156 lb (70.8 kg)  Height: 5\' 8"  (1.727 m)    Body mass index is 23.72 kg/m.   General: The patient is well-developed and well-nourished and in no acute distress  HEENT:  Head is [place]/AT.  Sclera are anicteric.   Neck:  The neck is nontender.  Skin: Extremities are without rash or  edema.   Neurologic Exam  Mental status: The patient is alert and oriented x 3 at the time of the examination. The patient has apparent normal recent and remote memory, with an apparently normal attention span and concentration ability.   Speech is normal.  Cranial nerves: Extraocular movements are full.   He  has mild weakness in the right lower face (normal strength in forehead).   There is good facial  sensation to soft touch bilaterally. Facial strength is normal.  Trapezius and sternocleidomastoid strength is normal. No dysarthria is noted.    No obvious hearing deficits are noted.  Motor:  Muscle bulk is normal.   Tone is normal. Strength is  5 / 5 in left side, 5-/5 right arm (mildly reduced RAM)  4+/5 right  leg.   Mild reduced RAM on the riht arm and leg  Sensory: Sensory testing is intact to pinprick, soft touch and vibration sensation in arms and slightly reduced vibraiton in right leg.  Coordination: Cerebellar testing reveals mildly reduced coordination right hand and foot.   Reduced heel-to-shin.  Gait and station: Station is normal.   He has a right foot drop.  The tandem gait is wide.   Romberg is negative.   Reflexes: Deep tendon reflexes are symmetric and normal in arms and increased right leg.        DIAGNOSTIC DATA (LABS, IMAGING, TESTING) - I reviewed patient records, labs, notes, testing and imaging myself where available.  Lab Results  Component Value Date   WBC 7.1 09/11/2020   HGB 17.4 09/11/2020   HCT 52.4 (H) 09/11/2020   MCV 85 09/11/2020   PLT 206 09/11/2020      Component Value Date/Time   NA 138 03/15/2021 0934   NA 142 09/11/2020 1035   K 4.2 03/15/2021 0934   CL 104 03/15/2021 0934   CO2 26 03/15/2021 0934   GLUCOSE 87 03/15/2021 0934   BUN 14 03/15/2021 0934   BUN 16 09/11/2020 1035   CREATININE 1.22 03/15/2021 0934   CREATININE 1.14 03/04/2014 1310   CALCIUM 9.2 03/15/2021 0934   PROT 7.3 09/11/2020 1035   ALBUMIN 4.6 09/11/2020 1035   AST 16 09/11/2020 1035   ALT 20 09/11/2020 1035   ALKPHOS 67 09/11/2020 1035   BILITOT 0.3 09/11/2020 1035   GFRNONAA >60 03/15/2021 0934   GFRAA 72 09/11/2020 1035   No results found for: CHOL, HDL, LDLCALC, LDLDIRECT, TRIG, CHOLHDL Lab Results  Component Value Date   HGBA1C 5.4 09/11/2020   Lab Results  Component Value Date   VITAMINB12 >2000 (H) 09/11/2020   Lab Results  Component Value Date    TSH 1.400 09/11/2020       ASSESSMENT AND PLAN  Multiple sclerosis (HCC)  Asperger syndrome  Bipolar disease, chronic (HCC)    Continue glatiramer.    2.   Stay active and exercie as tolerated. 3.    We discussed taking low dose cyclobenzaprine or low dose doxepin to help sleep maintenance insomnia.    4.    Consider provigil prn.   5.    He is seeing speech therapy later today.  I will order a modified barium swallow study return to see [place] in 4 months or sooner for new or worsening neurologic symptoms.   Dimitria Ketchum A. Felecia Shelling, MD, [facility] 83/41/9622, 2:97 PM Certified in Neurology, Clinical Neurophysiology, Sleep Medicine and Neuroimaging  [facility] [address] [place], [place] [address] [phone]

## 2021-06-20 NOTE — Therapy (Signed)
[facility] [address] [place], [place], [address] Phone: [phone]   Fax:  [phone]  Speech Language Pathology Evaluation  Patient Details  Name: George [name] MRN: [id number] Date of Birth: 1970/12/01 Referring Provider (SLP): Britt Bottom ,MD   Encounter Date: 06/20/2021   End of Session - 06/20/21 1533     Visit Number 1    Number of Visits 5    Date for SLP Re-Evaluation 08/20/21    SLP Start Time 1400    SLP Stop Time  3154    SLP Time Calculation (min) 38 min    Activity Tolerance Patient tolerated treatment well             Past Medical History:  Diagnosis Date   Anxiety    Asperger's syndrome    Autistic spectrum disorder    Asperger's (per H&P Olivia Clelland, PA)   Bipolar 1 disorder ([place])    "no mood swings in 20 years"  on medication   GERD (gastroesophageal reflux disease)    Head injury 2020   Hypertension    Hypothyroidism    IBS (irritable bowel syndrome)     Past Surgical History:  Procedure Laterality Date   NO PAST SURGERIES     RADIOLOGY WITH ANESTHESIA N/A 05/16/2020   Procedure: MRI BRAIN WITH AND WITHOUT CONTRAST;  Surgeon: Radiologist, Medication, MD;  Location: [facility];  Service: Radiology;  Laterality: N/A;   RADIOLOGY WITH ANESTHESIA N/A 08/29/2020   Procedure: MRI WITH ANESTHESIA CERVICAL SPINE WITH AND WITHOUT CONTRAST, THORACIC SPINE WITH AND WITHOUT CONTRAST;  Surgeon: Radiologist, Medication, MD;  Location: [facility];  Service: Radiology;  Laterality: N/A;   RADIOLOGY WITH ANESTHESIA N/A 03/15/2021   Procedure: MRI BRAIN WITH AND WITHOUT, CERIVAL WITH AND WITHOUT;  Surgeon: Radiologist, Medication, MD;  Location: [facility];  Service: Radiology;  Laterality: N/A;    There were no vitals filed for this visit.   Subjective Assessment - 06/20/21 1520     Subjective "I got choked for 10 seconds, but eventually it came up."    Currently in Pain? No/denies                 SLP Evaluation [facility] - 06/20/21 1520       SLP Visit Information   SLP Received On 06/20/21    Referring Provider (SLP) Felecia Shelling, Nanine Means ,MD    Onset Date MS suspected mid 1s, worsened in 2021    Medical Diagnosis Multiple Sclerosis (MS)      Subjective   Patient/Family Stated Goal To not choke      General Information   HPI He is a 50 year old man with Asperger syndrome and bipolar disease.   In 2012, he had an MRI of the brain and was found to have multiple T2/FLAIR hyperintense foci in the brain.   MRO if the  A lumbar puncture at the time was negative. He has a h/o Asperger syndrome and bipolar disease. Over the last year, he began to have more difficulty with right hand coordination and left foot drop.    Also the right side of his face was drooping some.    He came in to see Dr. Leta Baptist and had repeat imaging studies showing progression of the white matter foci and the appearance of a small focus at C5.    Repeat LP, however, showed negative CSF (no OCB) again. He has bipolar disease (sees Dr. Toy Care).  He also has autism.  He is on Depakote.   He notes being more emotional and easily gets overwhelmed with emotions.   This had happened when he was younger but then got better again.     He has irritable bowel syndrome and takes B12 since he was told absorption may be affected.    Mobility Status \\      Balance Screen   Has the patient fallen in the past 6 months Yes    How many times? Multiple    Has the patient had a decrease in activity level because of a fear of falling?  No    Is the patient reluctant to leave their home because of a fear of falling?  No      Prior Functional Status   Cognitive/Linguistic Baseline Baseline deficits    Baseline deficit details attention, pragmatics    Type of Home House     Lives With Family    Available Support Family    Education HS, 2 yrs of college    IT trainer work      Oral Motor/Sensory Function   Overall Oral  Motor/Sensory Function Impaired    Labial ROM Reduced right;Within Functional Limits    Labial Symmetry Abnormal symmetry right;Within Functional Limits    Labial Strength Within Functional Limits    Labial Sensation Reduced Right    Labial Coordination WFL    Lingual ROM Within Functional Limits    Lingual Symmetry Within Functional Limits    Lingual Strength Reduced Right    Lingual Sensation Within Functional Limits    Lingual Coordination WFL    Facial Symmetry Within Functional Limits    Overall Oral Motor/Sensory Function Slightly weak; pt reports difficulty with coordination while eating and swallowing, SLP was unable to see.      Motor Speech   Overall Motor Speech Impaired    Articulation Impaired    Level of Impairment Conversation    Intelligibility Intelligible    Conversation 75-100% accurate    Motor Planning Witnin functional limits    Interfering Components Premorbid status                               SLP Short Term Goals - 06/20/21 1534       SLP SHORT TERM GOAL #1   Title Pt will participate in Modified Barium Swallow (MBS) study to fully assess physiology and anatomy of the swallow and to determine the appropriate diet and/or rehabilitation exercises.    Time 1    Period Months    Status New    Target Date 07/20/21              SLP Long Term Goals - 06/20/21 1536       SLP LONG TERM GOAL #1   Title Patient will maintain adequate hydration/nutrition with optimum safety and efficiency of swallowing function on P.O. intake without overt signs and symptoms of aspiration for the highest appropriate diet level.    Baseline --    Time 2    Period Months    Status New    Target Date 08/20/21      SLP LONG TERM GOAL #2   Title --    Baseline --    Time --    Period --    Status --      SLP LONG TERM GOAL #3   Title --    Time --    Period --  Status --      SLP LONG TERM GOAL #4   Title --    Time --    Period --     Status --      SLP LONG TERM GOAL #5   Title --    Baseline --    Time --    Period --    Status --              Plan - 06/20/21 1534     Clinical Impression Statement Pt is a 50 yo male who presents to OP ST for evaluation with reported overt s/sx of aspiration. Relevant hx includes multiple sclerosis. Pt reports he "could not breathe for 10 sec" and "felt like he was dying" while eating a [employer] hamburger. Pt reports food typically gets stuck in his throat and he has to cough to get it out; however, he felt he could not expectorate food during this instance. Pt endorsed anxiety associated with eating meals and feels that he "should blend everything up in order to eat it." SLP reviewed Heimlich Maneuver when instances of choking occur (when he is alone). SLP recommended pt partake in MBSS in order to r/o any signs and symptoms of aspiration due to his reported difficulty with solids.  Pt exhibited concerns regarding food to be given during MBSS due to strict diet. Pt is on FODMAP diet, limiting types of food for pt intake. SLP encouraged pt to bring his own foods to assist with diet for MBSS (after speaking to scheduling SLP). SLP assessed swallowing function by instructing pt to take 3 continuous sips of water [facility] Protocol). Pt revealed no overt signs or symptoms of aspiration with thin liquids. Pt stated he uses swallowing strategies at home for swallowing liquids (ex: take smaller sips) of which he recalled from previous SLP. Pt declined solid trials due to strict diet and sensitive GI tract. SLP recommended eating softer meats/ pureed foods to reduce liklihood of choking. Pt reports he is going to chop all solids and/or blend them to reduce anxiety. SLP and pt in agreement that pt would benefit from a MBSS  to rule out aspiration. SLP contacted doctor for referral and to schedule MBS with acute care. No further therapy will be completed until MBS is completed. *Pt is pleased with  current level of speech intelligibility and reports he has strategies to use to increase intelligibility as needed. He reports he has slowed his rate of speech and this has been successful in conversations. SLP did not observe any significant dysarthria this date.    Speech Therapy Frequency 1x /week    Duration 4 weeks    Treatment/Interventions Aspiration precaution training;Trials of upgraded texture/liquids;Oral motor exercises;Compensatory strategies;Pharyngeal strengthening exercises;Cueing hierarchy;Patient/family education;Diet toleration management by SLP;SLP instruction and feedback    Potential to Achieve Goals Good    Potential Considerations Co-morbidities;Previous level of function;Ability to learn/carryover information;Cooperation/participation level    Consulted and Agree with Plan of Care Patient             Patient will benefit from skilled therapeutic intervention in order to improve the following deficits and impairments:   Dysphagia, oropharyngeal phase - Plan: SLP plan of care cert/re-cert    Problem List Patient Active Problem List   Diagnosis Date Noted   Bipolar disease, chronic ([place]) 06/20/2021   Slurred speech 02/26/2021   Autism 12/20/2020   Multiple sclerosis ([place]) 10/11/2020   MRI of brain abnormal 10/11/2020   Right sided weakness 10/11/2020  Gait disturbance 10/11/2020   Asperger syndrome 03/19/2013    Verdene Lennert MS, [place], CBIS  06/20/2021, 4:09 PM  [facility] [address] [place], [place], [address] Phone: [phone]   Fax:  [phone]  Name: George [name] MRN: [id number] Date of Birth: May 29, 1971

## 2021-06-21 ENCOUNTER — Other Ambulatory Visit (HOSPITAL_COMMUNITY): Payer: Self-pay

## 2021-06-21 DIAGNOSIS — R1310 Dysphagia, unspecified: Secondary | ICD-10-CM

## 2021-06-21 NOTE — Telephone Encounter (Signed)
Thank you, Dr. Felecia Shelling!

## 2021-06-22 ENCOUNTER — Other Ambulatory Visit: Payer: Self-pay

## 2021-06-22 ENCOUNTER — Encounter: Payer: Self-pay | Admitting: Occupational Therapy

## 2021-06-22 ENCOUNTER — Ambulatory Visit: Payer: PPO | Admitting: Occupational Therapy

## 2021-06-22 ENCOUNTER — Ambulatory Visit: Payer: PPO

## 2021-06-22 DIAGNOSIS — R2681 Unsteadiness on feet: Secondary | ICD-10-CM

## 2021-06-22 DIAGNOSIS — R41840 Attention and concentration deficit: Secondary | ICD-10-CM

## 2021-06-22 DIAGNOSIS — R2689 Other abnormalities of gait and mobility: Secondary | ICD-10-CM

## 2021-06-22 DIAGNOSIS — M6281 Muscle weakness (generalized): Secondary | ICD-10-CM | POA: Diagnosis not present

## 2021-06-22 DIAGNOSIS — R41844 Frontal lobe and executive function deficit: Secondary | ICD-10-CM

## 2021-06-22 DIAGNOSIS — R41842 Visuospatial deficit: Secondary | ICD-10-CM

## 2021-06-22 DIAGNOSIS — R278 Other lack of coordination: Secondary | ICD-10-CM

## 2021-06-22 NOTE — Therapy (Signed)
[facility] [address] [place], [place], [address] Phone: [phone]   Fax:  [phone]  Physical Therapy Treatment  Patient Details  Name: George [name] MRN: [id number] Date of Birth: 04-16-1971 Referring Provider (PT): Arlice Colt, MD   Encounter Date: 06/22/2021   PT End of Session - 06/22/21 1103     Visit Number 3    Number of Visits 9    Date for PT Re-Evaluation 07/27/21    Authorization Type [employer]    PT Start Time 1100    PT Stop Time 1143    PT Time Calculation (min) 43 min    Equipment Utilized During Treatment --    Activity Tolerance Patient tolerated treatment well    Behavior During Therapy Impulsive             Past Medical History:  Diagnosis Date   Anxiety    Asperger's syndrome    Autistic spectrum disorder    Asperger's (per H&P Olivia Clelland, PA)   Bipolar 1 disorder ([place])    "no mood swings in 20 years"  on medication   GERD (gastroesophageal reflux disease)    Head injury 2020   Hypertension    Hypothyroidism    IBS (irritable bowel syndrome)     Past Surgical History:  Procedure Laterality Date   NO PAST SURGERIES     RADIOLOGY WITH ANESTHESIA N/A 05/16/2020   Procedure: MRI BRAIN WITH AND WITHOUT CONTRAST;  Surgeon: Radiologist, Medication, MD;  Location: [facility];  Service: Radiology;  Laterality: N/A;   RADIOLOGY WITH ANESTHESIA N/A 08/29/2020   Procedure: MRI WITH ANESTHESIA CERVICAL SPINE WITH AND WITHOUT CONTRAST, THORACIC SPINE WITH AND WITHOUT CONTRAST;  Surgeon: Radiologist, Medication, MD;  Location: [facility];  Service: Radiology;  Laterality: N/A;   RADIOLOGY WITH ANESTHESIA N/A 03/15/2021   Procedure: MRI BRAIN WITH AND WITHOUT, CERIVAL WITH AND WITHOUT;  Surgeon: Radiologist, Medication, MD;  Location: [facility];  Service: Radiology;  Laterality: N/A;    There were no vitals filed for this visit.   Subjective Assessment - 06/22/21 1102      Subjective Patient reports no new changes/complaints. No falls to report. Reports he has been doing good since last visit.    Pertinent History h/o Asperger syndrome and bipolar disease    Limitations Standing;Walking;House hold activities    Patient Stated Goals Pt would like to be able to walk straight and live a long life.    Currently in Pain? No/denies    Pain Onset More than a month ago               [facility] Adult PT Treatment/Exercise - 06/22/21 0001       Transfers   Transfers Sit to Stand;Stand to Sit    Sit to Stand 7: Independent    Stand to Sit 7: Independent      Ambulation/Gait   Ambulation/Gait Yes    Ambulation/Gait Assistance 5: Supervision    Ambulation/Gait Assistance Details completed ambulation x  300 ft without AD focusing on equal step length and avoidance of recurvatum on RLE with intermittent tactile cues.    Ambulation Distance (Feet) 300 Feet    Gait Pattern Decreased arm swing - right;Decreased arm swing - left;Step-through pattern;Decreased weight shift to right;Decreased stance time - right    Ambulation Surface Level;Indoor      Exercises   Exercises Knee/Hip    Other Exercises  completed mini squats at countertop with light UE support  and red theraband x 10 reps. Cues for technique required.      Knee/Hip Exercises: Machines for Strengthening   Total Gym Leg Press Completed bilat leg press 65# 3 x 10 reps with cues to avoid locking knees. then with RLE only completed 35# 1 x 10 reps, intermittent tactile cues at posterior knee.      Knee/Hip Exercises: Standing   Hip Flexion Stengthening;Both;1 set;10 reps;Limitations;Knee bent    Hip Flexion Limitations completed alternating hip flexion/march at countertop with light UE support x 4 laps down and back.    Hip Abduction Stengthening;Both;Limitations;Knee straight    Abduction Limitations completed side stepping along countertop with red theraband x 4 laps down and back, cues for control and pace.     Hip Extension Stengthening;Both;1 set;Knee straight;Limitations    Extension Limitations completed backwards steps focused on improved hip extension along countertop with red theraband x 4 laps down and back, cues for control/pace and step length.    Forward Step Up Both;1 set;10 reps;Step Height: 6";Hand Hold: 0    Forward Step Up Limitations completed x 10 reps bilat step up without UE support, increased challenge with RLE > LLE. CGA.               PT Short Term Goals - 06/01/21 1157       PT SHORT TERM GOAL #1   Title Pt will be independent with  HEP to facilitate RLE strength and balance to progress to gym based program.    Time 4    Period Weeks    Status New    Target Date 06/29/21      PT SHORT TERM GOAL #2   Title Pt will be able to ascend/descend 5 steps independently w/ use of rail and reciprocal pattern demonstraiting improved functional mobility to allow for safe entry/egress from home.    Time 4    Period Weeks    Status New    Target Date 06/29/21               PT Long Term Goals - 06/01/21 1146       PT LONG TERM GOAL #1   Title Pt will improve FGA score to >/= 25/30 demonstrating a low fall risk to improve community/household ambulation.(LTG-07/27/21)    Baseline 06/01/21- 18/30    Time 8    Period Weeks    Status New    Target Date 07/27/21      PT LONG TERM GOAL #2   Title Pt will demonstrate >/= 4/5 strength in RLE demonstrating improved strength to faciltate pt's functional mobility.    Period Weeks    Status New    Target Date 07/27/21      PT LONG TERM GOAL #3   Title Pt will be able to ascend/descend a flight of stair with the use of one handrail mod I with reciprocal pattern to facilitate improved community ambulation and improved functional strength.    Time 8    Period Weeks    Status New    Target Date 07/27/21      PT LONG TERM GOAL #4   Title Pt will be able to ambulate >1097ft safely Ind on varied surfaces safely to improve  community ambulation.    Time 8    Period Weeks    Status New    Target Date 07/27/21                   Plan - 06/22/21 1244  Clinical Impression Statement Continued focus on BLE strengthening, specific focus on RLE > LLE. Patient require cues for pace/technique with completion, and tactile to promote reduced recurvatum. patient tolerating activities well with intermittent rest breaks. With Leg press, patietn did experience mild cramping in R thigh but resolved upon transitioning off machine. Will continue per POC.    Personal Factors and Comorbidities Comorbidity 3+;Time since onset of injury/illness/exacerbation    Comorbidities MS, Asperger's, Bipolar Disease, HTN    Examination-Activity Limitations Stand;Stairs;Squat;Reach Overhead;Locomotion Level;Transfers    Examination-Participation Restrictions Community Activity;Yard Work;Cleaning    Stability/Clinical Decision Making Evolving/Moderate complexity    Rehab Potential Good    PT Frequency 1x / week   plus eval   PT Duration 8 weeks    PT Treatment/Interventions ADLs/Self Care Home Management;Neuromuscular re-education;Manual techniques;Balance training;Therapeutic exercise;Therapeutic activities;DME Instruction;Functional mobility training;Stair training;Gait training;Patient/family education;Orthotic Fit/Training;Passive range of motion    PT Next Visit Plan Check STGs. Leg Press. Continue to incorporate activites he can complete at [facility]. BLE strengthening. SLS activities. SciFit/NuStep    Consulted and Agree with Plan of Care Patient             Patient will benefit from skilled therapeutic intervention in order to improve the following deficits and impairments:  Abnormal gait, Decreased coordination, Decreased mobility, Decreased strength, Decreased balance, Decreased safety awareness  Visit Diagnosis: Other abnormalities of gait and mobility  Muscle weakness (generalized)  Unsteadiness on  feet     Problem List Patient Active Problem List   Diagnosis Date Noted   Bipolar disease, chronic ([place]) 06/20/2021   Slurred speech 02/26/2021   Autism 12/20/2020   Multiple sclerosis ([place]) 10/11/2020   MRI of brain abnormal 10/11/2020   Right sided weakness 10/11/2020   Gait disturbance 10/11/2020   Asperger syndrome 03/19/2013    Jones Bales, PT, DPT 06/22/2021, 12:49 PM  [facility] [address] [place], [place], [address] Phone: [phone]   Fax:  [phone]  Name: George [name] MRN: [id number] Date of Birth: 10-23-1970

## 2021-06-22 NOTE — Therapy (Signed)
[facility] [address] [place], [place], [address] Phone: [phone]   Fax:  [phone]  Occupational Therapy Treatment  Patient Details  Name: George [name] MRN: [id number] Date of Birth: Aug 21, 1970 Referring Provider (OT): Arlice Colt, MD   Encounter Date: 06/22/2021   OT End of Session - 06/22/21 1017     Visit Number 4    Number of Visits 9    Date for OT Re-Evaluation 07/27/21    Authorization Type [employer]   $30 copay   Authorization Time Period $30 copay, VL:MN    OT Start Time 1015    OT Stop Time 1100    OT Time Calculation (min) 45 min    Activity Tolerance Patient tolerated treatment well    Behavior During Therapy [facility] for tasks assessed/performed             Past Medical History:  Diagnosis Date   Anxiety    Asperger's syndrome    Autistic spectrum disorder    Asperger's (per H&P Olivia Clelland, PA)   Bipolar 1 disorder ([place])    "no mood swings in 20 years"  on medication   GERD (gastroesophageal reflux disease)    Head injury 2020   Hypertension    Hypothyroidism    IBS (irritable bowel syndrome)     Past Surgical History:  Procedure Laterality Date   NO PAST SURGERIES     RADIOLOGY WITH ANESTHESIA N/A 05/16/2020   Procedure: MRI BRAIN WITH AND WITHOUT CONTRAST;  Surgeon: Radiologist, Medication, MD;  Location: [facility];  Service: Radiology;  Laterality: N/A;   RADIOLOGY WITH ANESTHESIA N/A 08/29/2020   Procedure: MRI WITH ANESTHESIA CERVICAL SPINE WITH AND WITHOUT CONTRAST, THORACIC SPINE WITH AND WITHOUT CONTRAST;  Surgeon: Radiologist, Medication, MD;  Location: [facility];  Service: Radiology;  Laterality: N/A;   RADIOLOGY WITH ANESTHESIA N/A 03/15/2021   Procedure: MRI BRAIN WITH AND WITHOUT, CERIVAL WITH AND WITHOUT;  Surgeon: Radiologist, Medication, MD;  Location: [facility];  Service: Radiology;  Laterality: N/A;    There were no vitals filed for this visit.    Subjective Assessment - 06/22/21 1017     Subjective  "pretty good - i'm doing fine"    Pertinent History MS suspected in mid 30's, worsened in mid-2021; experienced increased difficulty w/ Rt hand coordination and Rt foot drop. PMH also includes ASD (Asperger's syndrome), biplolar disorder, anxiety, and HTN    Limitations High fall risk    Currently in Pain? No/denies    Pain Score 0-No pain             Semicircle Pegboard with RUE with min verbal and visual cues for shoulder positioning and movement. PT with forward humeral movement with low reaching. Responsive to cues with better positioning. Pt able to manipulate all sizes with RUE  Sorting Playing Cards with RUE for coordination. Pt continues to req cues for shoulder positioning with decreased compensatory movements.  Typing Master games - bubbles with lowercase letters with increased skill, good time and use of BUE. Typing Test with 6 wpm net speed (10 wpm with 66% accuracy).   Towel Bunch with RUE for isolated finger movements. Mod difficulty.   Arm Bike: for conditioning and reciprocal movements on Level 1 for 6 minutes (forward and backward)             OT Education - 06/22/21 1046     Education Details Issued keyboarding/typing practice websites.    Person(s) Educated Patient  Methods Explanation;Handout    Comprehension Verbalized understanding              OT Short Term Goals - 06/15/21 1025       OT SHORT TERM GOAL #1   Title Pt will be independent with intiial HEP for RUE    Baseline Currently has HEP designed for coordination    Time 4    Period Weeks    Status On-going    Target Date 06/29/21      OT SHORT TERM GOAL #2   Title Pt will verbalize understanding of adapted strategies and equipment PRN for increasing safety and independence with ADLs and IADLs (i.e. cutting devices, fall prevention, shower seat, etc)    Baseline interested in more information re: shower chair    Time 4     Period Weeks    Status New      OT SHORT TERM GOAL #3   Title Pt will increase grip strength in RUE to 60 lbs or greater for increasing stability and functional use    Baseline R 55.9, L 96.7    Time 4    Period Weeks    Status On-going      OT SHORT TERM GOAL #4   Title Pt will obtain a lightweight object from mid level shelf with good body mechanics and no compensatory movements and with pain less than or equal to 1/10.    Time 4    Period Weeks    Status New      OT SHORT TERM GOAL #5   Title Pt will increase fine motor coordination in RUE by completing 9 hole peg test in 70 seconds or less.    Baseline R 75s L 25s    Time 4    Period Weeks    Status On-going               OT Long Term Goals - 06/01/21 1012       OT LONG TERM GOAL #1   Title Pt will be independent with updated HEP.    Time 8    Period Weeks    Status New    Target Date 07/27/21      OT LONG TERM GOAL #2   Title Pt will achieve shoulder flexion of 150* or greater in RUE for increasing reach and decrease pain.    Baseline 130*    Time 8    Period Weeks    Status New      OT LONG TERM GOAL #3   Title Pt will improve grip strength in RUE to 65 lbs or greater for increase in stability and functional use in RUE.    Baseline R 55.9 lbs, L 96.7 lbs    Time 8    Period Weeks    Status New      OT LONG TERM GOAL #4   Title Pt will increase fine motor coordination in RUE and completing 9 hole peg test in 65 seconds or less.    Baseline R 75s L 25s    Time 8    Period Weeks    Status New                   Plan - 06/22/21 1031     Clinical Impression Statement Pt with increased coordination with RUE today. Continues to progress towards goals.    OT Occupational Profile and History Detailed Assessment- Review of Records and additional review of physical, cognitive,  psychosocial history related to current functional performance    Occupational performance deficits (Please refer to  evaluation for details): ADL's;IADL's;Work;Leisure;Social Participation    Body Structure / Function / Physical Skills ADL;Strength;Decreased knowledge of use of DME;Tone;Dexterity;Balance;Body mechanics;Proprioception;UE functional use;IADL;ROM;Coordination;Mobility;FMC;Decreased knowledge of precautions;Gait;GMC;Endurance    Cognitive Skills Attention;Safety Awareness;Problem Solve    Psychosocial Skills Interpersonal Interaction;Environmental  Adaptations    Rehab Potential Fair   Decreased awareness of deficits and reported min carryover of learned strategies   Clinical Decision Making Several treatment options, min-mod task modification necessary    Comorbidities Affecting Occupational Performance: Presence of comorbidities impacting occupational performance    Comorbidities impacting occupational performance description: ASD, bipolar disorder    Modification or Assistance to Complete Evaluation  Min-Moderate modification of tasks or assist with assess necessary to complete eval    OT Frequency 1x / week    OT Duration 8 weeks    OT Treatment/Interventions Self-care/ADL training;DME and/or AE instruction;Splinting;Therapeutic activities;Psychosocial skills training;Therapeutic exercise;Coping strategies training;Functional Mobility Training;Neuromuscular education;Passive range of motion;Visual/perceptual remediation/compensation;Patient/family education;Energy conservation;Manual Therapy;Balance training    Plan continue working on scap stabilization, RUE coordination and shoulder ROM (low reaching), start checking some STGs.    Recommended Other Services Currently receiving PT at this site    Consulted and Agree with Plan of Care Patient             Patient will benefit from skilled therapeutic intervention in order to improve the following deficits and impairments:   Body Structure / Function / Physical Skills: ADL, Strength, Decreased knowledge of use of DME, Tone, Dexterity, Balance,  Body mechanics, Proprioception, UE functional use, IADL, ROM, Coordination, Mobility, FMC, Decreased knowledge of precautions, Gait, GMC, Endurance Cognitive Skills: Attention, Safety Awareness, Problem Solve Psychosocial Skills: Interpersonal Interaction, Environmental  Adaptations   Visit Diagnosis: Frontal lobe and executive function deficit  Other abnormalities of gait and mobility  Muscle weakness (generalized)  Unsteadiness on feet  Other lack of coordination  Attention and concentration deficit  Visuospatial deficit    Problem List Patient Active Problem List   Diagnosis Date Noted   Bipolar disease, chronic ([place]) 06/20/2021   Slurred speech 02/26/2021   Autism 12/20/2020   Multiple sclerosis ([place]) 10/11/2020   MRI of brain abnormal 10/11/2020   Right sided weakness 10/11/2020   Gait disturbance 10/11/2020   Asperger syndrome 03/19/2013    Zachery Conch, OT/L 06/22/2021, 11:05 AM  [facility] [address] [place], [place], [address] Phone: [phone]   Fax:  [phone]  Name: George [name] MRN: [id number] Date of Birth: 1970-11-02

## 2021-06-22 NOTE — Patient Instructions (Signed)
Typing Websites:  Typing.com Typingclub.com Typinggames.zone TypingTest.com   Mouse Accuracy:  Mouseaccuracy.com  

## 2021-06-26 ENCOUNTER — Ambulatory Visit: Payer: PPO

## 2021-06-26 ENCOUNTER — Ambulatory Visit: Payer: PPO | Admitting: Occupational Therapy

## 2021-06-26 ENCOUNTER — Other Ambulatory Visit: Payer: Self-pay

## 2021-06-26 DIAGNOSIS — R2681 Unsteadiness on feet: Secondary | ICD-10-CM

## 2021-06-26 DIAGNOSIS — R41842 Visuospatial deficit: Secondary | ICD-10-CM

## 2021-06-26 DIAGNOSIS — R2689 Other abnormalities of gait and mobility: Secondary | ICD-10-CM

## 2021-06-26 DIAGNOSIS — R278 Other lack of coordination: Secondary | ICD-10-CM

## 2021-06-26 DIAGNOSIS — M6281 Muscle weakness (generalized): Secondary | ICD-10-CM

## 2021-06-26 DIAGNOSIS — R41840 Attention and concentration deficit: Secondary | ICD-10-CM

## 2021-06-26 NOTE — Therapy (Signed)
[facility] [address] [place], [place], [address] Phone: [phone]   Fax:  [phone]  Physical Therapy Treatment  Patient Details  Name: George [name] MRN: [id number] Date of Birth: Sep 05, 1970 Referring Provider (PT): Arlice Colt, MD   Encounter Date: 06/26/2021   PT End of Session - 06/26/21 1228     Visit Number 4    Number of Visits 9    Date for PT Re-Evaluation 07/27/21    Authorization Type [employer]    PT Start Time 1014    PT Stop Time 1057    PT Time Calculation (min) 43 min    Activity Tolerance Patient tolerated treatment well    Behavior During Therapy Impulsive             Past Medical History:  Diagnosis Date   Anxiety    Asperger's syndrome    Autistic spectrum disorder    Asperger's (per H&P Olivia Clelland, PA)   Bipolar 1 disorder ([place])    "no mood swings in 20 years"  on medication   GERD (gastroesophageal reflux disease)    Head injury 2020   Hypertension    Hypothyroidism    IBS (irritable bowel syndrome)     Past Surgical History:  Procedure Laterality Date   NO PAST SURGERIES     RADIOLOGY WITH ANESTHESIA N/A 05/16/2020   Procedure: MRI BRAIN WITH AND WITHOUT CONTRAST;  Surgeon: Radiologist, Medication, MD;  Location: [facility];  Service: Radiology;  Laterality: N/A;   RADIOLOGY WITH ANESTHESIA N/A 08/29/2020   Procedure: MRI WITH ANESTHESIA CERVICAL SPINE WITH AND WITHOUT CONTRAST, THORACIC SPINE WITH AND WITHOUT CONTRAST;  Surgeon: Radiologist, Medication, MD;  Location: [facility];  Service: Radiology;  Laterality: N/A;   RADIOLOGY WITH ANESTHESIA N/A 03/15/2021   Procedure: MRI BRAIN WITH AND WITHOUT, CERIVAL WITH AND WITHOUT;  Surgeon: Radiologist, Medication, MD;  Location: [facility];  Service: Radiology;  Laterality: N/A;    There were no vitals filed for this visit.   Subjective Assessment - 06/26/21 1013     Subjective Patient reports that his leg is not  worse or better. Pt reports that he does not get charlie horses anymore and believes his muscles are getting stronger.    Pertinent History h/o Asperger syndrome and bipolar disease    Limitations Standing;Walking;House hold activities    Patient Stated Goals Pt would like to be able to walk straight and live a long life.    Currently in Pain? No/denies    Pain Onset More than a month ago                               [facility] Adult PT Treatment/Exercise - 06/26/21 1026       Transfers   Transfers Sit to Stand;Stand to Sit    Sit to Stand 7: Independent    Stand to Sit 7: Independent      Ambulation/Gait   Ambulation/Gait Yes    Ambulation/Gait Assistance 5: Supervision    Ambulation Distance (Feet) 345 Feet    Gait Pattern Decreased arm swing - right;Decreased arm swing - left;Step-through pattern;Decreased weight shift to right;Decreased stance time - right    Ambulation Surface Level;Indoor    Stairs Yes    Stairs Assistance 5: Supervision    Stairs Assistance Details (indicate cue type and reason) Some instability noted at right knee espeically with descent    Stair Management  Technique One rail Left;Alternating pattern    Number of Stairs 8    Height of Stairs 6    Gait Comments Pt completed 3 laps of resisted walking around track. PT provided min guard and SPT provided increased resistance each lap. PT and SPT provided verbal cues to focus on maintaining RLE stable during gait. Pt demonstrated difficutly in keeping RLE stable at times especially as RLE would fatigue. Pt completed 2 lap of resited backwards walking for increased hamstring activation, Pt demonstrated instablity in RLE while stepping and increased w/ fatigue. PT provided verbal cues to maintain tight glutes to stabalize RLE while stepping back. PT provided min guard and SPT provided resistance.      Exercises   Other Exercises  Pt performed eccentric step downs off 6 inch step while standing on RLE  to focus on maintining lower extremity stable for 3x8. PT provided min guard and verbal cues for techinque. Pt required slight UE at // and demonstrated some instability in R knee. Pt performed leg press 2x10 of 65lbs and 1x10 of 75lbs with focus on going slow and controlled. PT provided verbal cues for techinque.                     PT Education - 06/26/21 1246     Education Details PT and pt reviewed HEP and found ways to incorporate the exercises in his gym routine.    Person(s) Educated Patient    Methods Explanation;Demonstration    Comprehension Verbalized understanding;Returned demonstration              PT Short Term Goals - 06/26/21 1247       PT SHORT TERM GOAL #1   Title Pt will be independent with  HEP to facilitate RLE strength and balance to progress to gym based program.    Time 4    Period Weeks    Status On-going    Target Date 06/29/21      PT SHORT TERM GOAL #2   Title Pt will be able to ascend/descend 5 steps independently w/ use of rail and reciprocal pattern demonstraiting improved functional mobility to allow for safe entry/egress from home.    Baseline 11/22- pt ascended/descended 8 steps    Time 4    Period Weeks    Status Achieved    Target Date 06/29/21               PT Long Term Goals - 06/01/21 1146       PT LONG TERM GOAL #1   Title Pt will improve FGA score to >/= 25/30 demonstrating a low fall risk to improve community/household ambulation.(LTG-07/27/21)    Baseline 06/01/21- 18/30    Time 8    Period Weeks    Status New    Target Date 07/27/21      PT LONG TERM GOAL #2   Title Pt will demonstrate >/= 4/5 strength in RLE demonstrating improved strength to faciltate pt's functional mobility.    Period Weeks    Status New    Target Date 07/27/21      PT LONG TERM GOAL #3   Title Pt will be able to ascend/descend a flight of stair with the use of one handrail mod I with reciprocal pattern to facilitate improved  community ambulation and improved functional strength.    Time 8    Period Weeks    Status New    Target Date 07/27/21  PT LONG TERM GOAL #4   Title Pt will be able to ambulate >1050ft safely Ind on varied surfaces safely to improve community ambulation.    Time 8    Period Weeks    Status New    Target Date 07/27/21                   Plan - 06/26/21 1249     Clinical Impression Statement Today's session focused on checking pt's STG and continued lower extremity strenghtening. Pt demonstrated progress being able to ambulate 8 steps in a reciprocal pattern w/ handrail assistance. Pt still demonstrates deficits in RLE strength as it buckles while navigating stairs and while ambulation. PT and pt reviewed HEP to find ways to incorporate exercises into pt's existing gym routine. PT will continue to progress pt as tolerated w/ activities to strengthen pt's RLE and facilitate pt achievement of LTG.    Personal Factors and Comorbidities Comorbidity 3+;Time since onset of injury/illness/exacerbation    Comorbidities MS, Asperger's, Bipolar Disease, HTN    Examination-Activity Limitations Stand;Stairs;Squat;Reach Overhead;Locomotion Level;Transfers    Examination-Participation Restrictions Community Activity;Yard Work;Cleaning    Stability/Clinical Decision Making Evolving/Moderate complexity    Rehab Potential Good    PT Frequency 1x / week   plus eval   PT Duration 8 weeks    PT Treatment/Interventions ADLs/Self Care Home Management;Neuromuscular re-education;Manual techniques;Balance training;Therapeutic exercise;Therapeutic activities;DME Instruction;Functional mobility training;Stair training;Gait training;Patient/family education;Orthotic Fit/Training;Passive range of motion    PT Next Visit Plan Continue eccentric step downs, resisted walking, Leg Press. Continue to incorporate activites he can complete at [facility]. BLE strengthening. SLS activities. SciFit/NuStep    Consulted and  Agree with Plan of Care Patient             Patient will benefit from skilled therapeutic intervention in order to improve the following deficits and impairments:  Abnormal gait, Decreased coordination, Decreased mobility, Decreased strength, Decreased balance, Decreased safety awareness  Visit Diagnosis: Other abnormalities of gait and mobility  Muscle weakness (generalized)  Unsteadiness on feet     Problem List Patient Active Problem List   Diagnosis Date Noted   Bipolar disease, chronic ([place]) 06/20/2021   Slurred speech 02/26/2021   Autism 12/20/2020   Multiple sclerosis ([place]) 10/11/2020   MRI of brain abnormal 10/11/2020   Right sided weakness 10/11/2020   Gait disturbance 10/11/2020   Asperger syndrome 03/19/2013    Lottie Mussel, Student-PT 06/26/2021, 12:55 PM  [facility] [address] [place], [place], [address] Phone: [phone]   Fax:  [phone]  Name: George [name] MRN: [id number] Date of Birth: [birth year]

## 2021-06-26 NOTE — Therapy (Signed)
[facility] [address] [place], [place], [address] Phone: [phone]   Fax:  [phone]  Occupational Therapy Treatment  Patient Details  Name: George [name] MRN: [id number] Date of Birth: Feb 20, 1971 Referring Provider (OT): Arlice Colt, MD   Encounter Date: 06/26/2021   OT End of Session - 06/26/21 1007     Visit Number 5    Number of Visits 9    Date for OT Re-Evaluation 07/27/21    Authorization Type [employer]   $30 copay   Authorization Time Period $30 copay, VL:MN    OT Start Time 0930    OT Stop Time 1012    OT Time Calculation (min) 42 min    Activity Tolerance Patient tolerated treatment well    Behavior During Therapy [facility] for tasks assessed/performed             Past Medical History:  Diagnosis Date   Anxiety    Asperger's syndrome    Autistic spectrum disorder    Asperger's (per H&P Olivia Clelland, PA)   Bipolar 1 disorder ([place])    "no mood swings in 20 years"  on medication   GERD (gastroesophageal reflux disease)    Head injury 2020   Hypertension    Hypothyroidism    IBS (irritable bowel syndrome)     Past Surgical History:  Procedure Laterality Date   NO PAST SURGERIES     RADIOLOGY WITH ANESTHESIA N/A 05/16/2020   Procedure: MRI BRAIN WITH AND WITHOUT CONTRAST;  Surgeon: Radiologist, Medication, MD;  Location: [facility];  Service: Radiology;  Laterality: N/A;   RADIOLOGY WITH ANESTHESIA N/A 08/29/2020   Procedure: MRI WITH ANESTHESIA CERVICAL SPINE WITH AND WITHOUT CONTRAST, THORACIC SPINE WITH AND WITHOUT CONTRAST;  Surgeon: Radiologist, Medication, MD;  Location: [facility];  Service: Radiology;  Laterality: N/A;   RADIOLOGY WITH ANESTHESIA N/A 03/15/2021   Procedure: MRI BRAIN WITH AND WITHOUT, CERIVAL WITH AND WITHOUT;  Surgeon: Radiologist, Medication, MD;  Location: [facility];  Service: Radiology;  Laterality: N/A;    There were no vitals filed for this visit.    Subjective Assessment - 06/26/21 0933     Subjective  About the same. I played a video game yesterday for a long time and my hand was like jello    Pertinent History MS suspected in mid 30's, worsened in mid-2021; experienced increased difficulty w/ Rt hand coordination and Rt foot drop. PMH also includes ASD (Asperger's syndrome), biplolar disorder, anxiety, and HTN    Limitations High fall risk    Currently in Pain? No/denies              Reassessed 9 hole peg test however pt w/ decline today taking 93 sec.   Practiced putting medium sized pegs in pegboard Rt hand copying peg design w/ self corrected errors. Pt became frustrated t/o task and instructed to rest prn at intervals.   Prone: scapula retraction followed by prone on elbows w/ trunk extension for scapula stability/strengthening w/ cues needed to slow down as pt is very impulsive.  Standing over mat for modified quadraped for cat/cow stretch and A/P wt shifts  UBE x 6 min, level 3 (3 min forward, 3 min backwards)                       OT Short Term Goals - 06/15/21 1025       OT SHORT TERM GOAL #1   Title Pt will be independent  with intiial HEP for RUE    Baseline Currently has HEP designed for coordination    Time 4    Period Weeks    Status On-going    Target Date 06/29/21      OT SHORT TERM GOAL #2   Title Pt will verbalize understanding of adapted strategies and equipment PRN for increasing safety and independence with ADLs and IADLs (i.e. cutting devices, fall prevention, shower seat, etc)    Baseline interested in more information re: shower chair    Time 4    Period Weeks    Status New      OT SHORT TERM GOAL #3   Title Pt will increase grip strength in RUE to 60 lbs or greater for increasing stability and functional use    Baseline R 55.9, L 96.7    Time 4    Period Weeks    Status On-going      OT SHORT TERM GOAL #4   Title Pt will obtain a lightweight object from mid level shelf  with good body mechanics and no compensatory movements and with pain less than or equal to 1/10.    Time 4    Period Weeks    Status New      OT SHORT TERM GOAL #5   Title Pt will increase fine motor coordination in RUE by completing 9 hole peg test in 70 seconds or less.    Baseline R 75s L 25s    Time 4    Period Weeks    Status On-going               OT Long Term Goals - 06/01/21 1012       OT LONG TERM GOAL #1   Title Pt will be independent with updated HEP.    Time 8    Period Weeks    Status New    Target Date 07/27/21      OT LONG TERM GOAL #2   Title Pt will achieve shoulder flexion of 150* or greater in RUE for increasing reach and decrease pain.    Baseline 130*    Time 8    Period Weeks    Status New      OT LONG TERM GOAL #3   Title Pt will improve grip strength in RUE to 65 lbs or greater for increase in stability and functional use in RUE.    Baseline R 55.9 lbs, L 96.7 lbs    Time 8    Period Weeks    Status New      OT LONG TERM GOAL #4   Title Pt will increase fine motor coordination in RUE and completing 9 hole peg test in 65 seconds or less.    Baseline R 75s L 25s    Time 8    Period Weeks    Status New                   Plan - 06/26/21 1008     Clinical Impression Statement Pt slower w/ Rt hand coordination today and contributes this to playing too long on video game    OT Occupational Profile and History Detailed Assessment- Review of Records and additional review of physical, cognitive, psychosocial history related to current functional performance    Occupational performance deficits (Please refer to evaluation for details): ADL's;IADL's;Work;Leisure;Social Participation    Body Structure / Function / Physical Skills ADL;Strength;Decreased knowledge of use of DME;Tone;Dexterity;Balance;Body mechanics;Proprioception;UE functional use;IADL;ROM;Coordination;Mobility;FMC;Decreased knowledge  of precautions;Gait;GMC;Endurance     Cognitive Skills Attention;Safety Awareness;Problem Solve    Psychosocial Skills Interpersonal Interaction;Environmental  Adaptations    Rehab Potential Fair   Decreased awareness of deficits and reported min carryover of learned strategies   Clinical Decision Making Several treatment options, min-mod task modification necessary    Comorbidities Affecting Occupational Performance: Presence of comorbidities impacting occupational performance    Comorbidities impacting occupational performance description: ASD, bipolar disorder    Modification or Assistance to Complete Evaluation  Min-Moderate modification of tasks or assist with assess necessary to complete eval    OT Frequency 1x / week    OT Duration 8 weeks    OT Treatment/Interventions Self-care/ADL training;DME and/or AE instruction;Splinting;Therapeutic activities;Psychosocial skills training;Therapeutic exercise;Coping strategies training;Functional Mobility Training;Neuromuscular education;Passive range of motion;Visual/perceptual remediation/compensation;Patient/family education;Energy conservation;Manual Therapy;Balance training    Plan continue working on scap stabilization, RUE coordination and shoulder ROM (low reaching), start checking some STGs.    Recommended Other Services Currently receiving PT at this site    Consulted and Agree with Plan of Care Patient             Patient will benefit from skilled therapeutic intervention in order to improve the following deficits and impairments:   Body Structure / Function / Physical Skills: ADL, Strength, Decreased knowledge of use of DME, Tone, Dexterity, Balance, Body mechanics, Proprioception, UE functional use, IADL, ROM, Coordination, Mobility, FMC, Decreased knowledge of precautions, Gait, GMC, Endurance Cognitive Skills: Attention, Safety Awareness, Problem Solve Psychosocial Skills: Interpersonal Interaction, Environmental  Adaptations   Visit Diagnosis: Other lack of  coordination  Muscle weakness (generalized)  Visuospatial deficit  Attention and concentration deficit    Problem List Patient Active Problem List   Diagnosis Date Noted   Bipolar disease, chronic ([place]) 06/20/2021   Slurred speech 02/26/2021   Autism 12/20/2020   Multiple sclerosis ([place]) 10/11/2020   MRI of brain abnormal 10/11/2020   Right sided weakness 10/11/2020   Gait disturbance 10/11/2020   Asperger syndrome 03/19/2013    Carey Bullocks, OTR/L 06/26/2021, 12:17 PM  [facility] [address] [place], [place], [address] Phone: [phone]   Fax:  [phone]  Name: KNIGHT OELKERS MRN: [id number] Date of Birth: Oct 05, 1970

## 2021-06-27 ENCOUNTER — Ambulatory Visit (HOSPITAL_COMMUNITY)
Admission: RE | Admit: 2021-06-27 | Discharge: 2021-06-27 | Disposition: A | Discharge status: Home / Self Care | Payer: PPO | Source: Ambulatory Visit | Attending: Neurology | Admitting: Neurology

## 2021-06-27 DIAGNOSIS — G35 Multiple sclerosis: Secondary | ICD-10-CM | POA: Insufficient documentation

## 2021-06-27 DIAGNOSIS — R1310 Dysphagia, unspecified: Secondary | ICD-10-CM | POA: Insufficient documentation

## 2021-06-27 NOTE — Progress Notes (Signed)
Objective Swallowing Evaluation: Type of Study: MBS-Modified Barium Swallow Study   Patient Details  Name: George [name] MRN: [id number] Date of Birth: 02-12-1971  Today's Date: 06/27/2021 Time: SLP Start Time (ACUTE ONLY): 78 -SLP Stop Time (ACUTE ONLY): 1215  SLP Time Calculation (min) (ACUTE ONLY): 45 min   Past Medical History:  Past Medical History:  Diagnosis Date   Anxiety    Asperger's syndrome    Autistic spectrum disorder    Asperger's (per H&P George Clelland, PA)   Bipolar 1 disorder ([place])    "no mood swings in 20 years"  on medication   GERD (gastroesophageal reflux disease)    Head injury 2020   Hypertension    Hypothyroidism    IBS (irritable bowel syndrome)    Past Surgical History:  Past Surgical History:  Procedure Laterality Date   NO PAST SURGERIES     RADIOLOGY WITH ANESTHESIA N/A 05/16/2020   Procedure: MRI BRAIN WITH AND WITHOUT CONTRAST;  Surgeon: Radiologist, Medication, MD;  Location: [facility];  Service: Radiology;  Laterality: N/A;   RADIOLOGY WITH ANESTHESIA N/A 08/29/2020   Procedure: MRI WITH ANESTHESIA CERVICAL SPINE WITH AND WITHOUT CONTRAST, THORACIC SPINE WITH AND WITHOUT CONTRAST;  Surgeon: Radiologist, Medication, MD;  Location: [facility];  Service: Radiology;  Laterality: N/A;   RADIOLOGY WITH ANESTHESIA N/A 03/15/2021   Procedure: MRI BRAIN WITH AND WITHOUT, CERIVAL WITH AND WITHOUT;  Surgeon: Radiologist, Medication, MD;  Location: [facility];  Service: Radiology;  Laterality: N/A;   HPI: 50 y.o. male referred for OP MBS.  Hx of Asperger syndrome, bipolar disease, relapsing remitting MS, GERD, IBS.  MRI 05/16/20: mild progression with white matter lesions .  Left > right pons; involvement of middle cerebellar peduncle - stable on right, new focus on left. Stable focus right thalamus. Cranial nerves: mild weakness lower right face. Good sensation.  He has seen OP SLP services in the past for dysarthria. Pt had a choking incident in early November  2022, during which his airway was obstructed for 10-20 seconds while he was eating a hamburger alone at home. He reports food occasionally getting stuck in throat. He endorses anxiety associated with eating, and has begun to avoid certain foods and blend items to make smoothies.  Pt is on FODMAP diet - he brought food items with him to radiology suite to be used during testing.  We reviewed ingredients in barium - the second to last and last ingredients listed were these short-chain carbs.  Pt acknowledged understanding of their presence and determined that he wanted to proceed with assessment; we reviewed that the amount of barium would be limited to likely less than three ounces; he agreed.   Subjective: participatory    Recommendations for follow up therapy are one component of a multi-disciplinary discharge planning process, led by the attending physician.  Recommendations may be updated based on patient status, additional functional criteria and insurance authorization.  Assessment / Plan / Recommendation  Clinical Impressions 06/27/2021  Clinical Impression Pt participated in [facility] - he presents with dysphagia that may be attributed to [place] or a functional neurological disorder. Overall biomechanics of the swallow were WFL. There was an occasion when George [name] described anticipating difficulty swallowing, and there was trace/miniscule aspiration of the subsequent bolus (before the swallow).  There was adequate oral control; occasional piecemeal bolusing.  There were no subsequent aspiration events; occasional penetration of thins (PAS 2 - considered WFL). There was overall adequate laryngeal vestibule closure; adequate pharyngeal squeeze; adequate  UES opening. The 13 mm barium pill lodged temporarily at mid-esophagus and a water rinse was necessary to dislodge it.   While MS may be a contributing etiology of pt's dysphagia, there are diagnostic features that support an additional functional  neurological component.  His symptoms are inconsistent; they appear to be disproportionate to relatively normal function on MBS; pt also describes an inciting event after which swallowing became more problematic. He may benefit from trial therapy to address normalization of swallowing patterns and avoidance of texture modifications.  George [name] and I reviewed the video of his swallowing. We discussed the possibility of his choking event leading to the establishment of maladaptive swallowing behaviors. He agrees with some trial therapy.  I reached out via email to his OP SLP to discuss this possible diagnosis.  SLP Visit Diagnosis Dysphagia, unspecified (R13.10)  Attention and concentration deficit following --  Frontal lobe and executive function deficit following --  Impact on safety and function --      Treatment Recommendations 06/27/2021  Treatment Recommendations Defer treatment plan to f/u with SLP     No flowsheet data found.  Diet Recommendations 06/27/2021  SLP Diet Recommendations Regular solids;Thin liquid  Liquid Administration via Cup;Straw  Medication Administration Whole meds with liquid  Compensations --  Postural Changes --      Other Recommendations 06/27/2021  Recommended Consults --  Oral Care Recommendations --  Other Recommendations --  Follow Up Recommendations Outpatient SLP  Assistance recommended at discharge None  Functional Status Assessment --    No flowsheet data found.

## 2021-07-04 ENCOUNTER — Ambulatory Visit: Payer: PPO | Admitting: Occupational Therapy

## 2021-07-04 ENCOUNTER — Ambulatory Visit: Payer: PPO

## 2021-07-04 ENCOUNTER — Encounter: Payer: PPO | Admitting: Speech Pathology

## 2021-07-09 DIAGNOSIS — E291 Testicular hypofunction: Secondary | ICD-10-CM | POA: Diagnosis not present

## 2021-07-11 ENCOUNTER — Encounter: Payer: Self-pay | Admitting: Occupational Therapy

## 2021-07-11 ENCOUNTER — Ambulatory Visit: Payer: PPO | Attending: Neurology

## 2021-07-11 ENCOUNTER — Other Ambulatory Visit: Payer: Self-pay

## 2021-07-11 ENCOUNTER — Ambulatory Visit: Payer: PPO | Admitting: Occupational Therapy

## 2021-07-11 DIAGNOSIS — R2689 Other abnormalities of gait and mobility: Secondary | ICD-10-CM | POA: Insufficient documentation

## 2021-07-11 DIAGNOSIS — R278 Other lack of coordination: Secondary | ICD-10-CM | POA: Insufficient documentation

## 2021-07-11 DIAGNOSIS — R2681 Unsteadiness on feet: Secondary | ICD-10-CM | POA: Diagnosis not present

## 2021-07-11 DIAGNOSIS — R1312 Dysphagia, oropharyngeal phase: Secondary | ICD-10-CM | POA: Diagnosis not present

## 2021-07-11 DIAGNOSIS — R41842 Visuospatial deficit: Secondary | ICD-10-CM

## 2021-07-11 DIAGNOSIS — R41844 Frontal lobe and executive function deficit: Secondary | ICD-10-CM | POA: Insufficient documentation

## 2021-07-11 DIAGNOSIS — R41840 Attention and concentration deficit: Secondary | ICD-10-CM | POA: Insufficient documentation

## 2021-07-11 DIAGNOSIS — M6281 Muscle weakness (generalized): Secondary | ICD-10-CM | POA: Diagnosis not present

## 2021-07-11 NOTE — Patient Instructions (Signed)
1. Grip Strengthening (Resistive Putty)   Squeeze putty using thumb and all fingers. Repeat _20___ times. Do __2__ sessions per day.   2. Roll putty into tube on table and pinch between each finger and thumb x 10 reps each. (can do ring and small finger together)     Copyright  VHI. All rights reserved.   

## 2021-07-11 NOTE — Therapy (Signed)
[facility] [address] [place], [place], [address] Phone: [phone]   Fax:  [phone]  Physical Therapy Treatment  Patient Details  Name: George [name] MRN: [id number] Date of Birth: [birth year] Referring Provider (PT): Arlice Colt, MD   Encounter Date: 07/11/2021   PT End of Session - 07/11/21 1106     Visit Number 5    Number of Visits 9    Date for PT Re-Evaluation 07/27/21    Authorization Type [employer]    PT Start Time 1015    PT Stop Time 1057    PT Time Calculation (min) 42 min    Activity Tolerance Patient tolerated treatment well    Behavior During Therapy Impulsive             Past Medical History:  Diagnosis Date   Anxiety    Asperger's syndrome    Autistic spectrum disorder    Asperger's (per H&P Olivia Clelland, PA)   Bipolar 1 disorder ([place])    "no mood swings in 20 years"  on medication   GERD (gastroesophageal reflux disease)    Head injury 2020   Hypertension    Hypothyroidism    IBS (irritable bowel syndrome)     Past Surgical History:  Procedure Laterality Date   NO PAST SURGERIES     RADIOLOGY WITH ANESTHESIA N/A 05/16/2020   Procedure: MRI BRAIN WITH AND WITHOUT CONTRAST;  Surgeon: Radiologist, Medication, MD;  Location: [facility];  Service: Radiology;  Laterality: N/A;   RADIOLOGY WITH ANESTHESIA N/A 08/29/2020   Procedure: MRI WITH ANESTHESIA CERVICAL SPINE WITH AND WITHOUT CONTRAST, THORACIC SPINE WITH AND WITHOUT CONTRAST;  Surgeon: Radiologist, Medication, MD;  Location: [facility];  Service: Radiology;  Laterality: N/A;   RADIOLOGY WITH ANESTHESIA N/A 03/15/2021   Procedure: MRI BRAIN WITH AND WITHOUT, CERIVAL WITH AND WITHOUT;  Surgeon: Radiologist, Medication, MD;  Location: [facility];  Service: Radiology;  Laterality: N/A;    There were no vitals filed for this visit.   Subjective Assessment - 07/11/21 1017     Subjective Pt reports he has pain in his right  hip but feels like it could be a bruise. Pt reports he has been practicing going up and down steps in a controlled manner.  Pt reports he continues to exercise at the gym/.    Pertinent History h/o Asperger syndrome and bipolar disease    Limitations Standing;Walking;House hold activities    Patient Stated Goals Pt would like to be able to walk straight and live a long life.    Currently in Pain? Yes    Pain Score 2     Pain Location Hip    Pain Orientation Right;Anterior    Pain Descriptors / Indicators Aching    Pain Type Acute pain    Pain Onset More than a month ago                               [facility] Adult PT Treatment/Exercise - 07/11/21 1020       Transfers   Transfers Sit to Stand;Stand to Sit    Sit to Stand 7: Independent    Stand to Sit 7: Independent      Ambulation/Gait   Ambulation/Gait Yes    Ambulation/Gait Assistance 5: Supervision    Ambulation Distance (Feet) 345 Feet    Assistive device None    Gait Pattern Decreased arm swing - right;Decreased arm  swing - left;Step-through pattern;Decreased weight shift to right;Decreased stance time - right   Right knee buckles   Stairs Yes    Stairs Assistance 5: Supervision    Stairs Assistance Details (indicate cue type and reason) some instability noted at right especially w/ descent    Stair Management Technique One rail Left;Alternating pattern    Number of Stairs 4    Height of Stairs 6      Exercises   Other Exercises  Pt performed step ups w/ RLE at stairs 3x10. Pt had increased difficutly when extending leg completely causing his leg to buckle. Pt required UE assist w/ rail. PT provided verbal cues to go slow and controlled and to maintain upright posture. At bottom step of stairs pt performed 3x8 of eccentric step downs w/ RLE. Pt required UE assist w/ rails. PT provided verbal cues to maintain upright posture and to move in slow and controlled manner. Pt demonstrates instability at R knee  during these activities. Pt performed 3x10 of leg press of 75lbs w/ BLE. PT cued pt to control weight. Pt performed 3x10 of standing hamstring curls at counter w/ red theraband for LLE and yellow theraband for RLE. Pt demonstrates increased difficutly when controlling the movement in RLE. PT cued pt to controll the motion and maintain upright posture.                       PT Short Term Goals - 06/26/21 1247       PT SHORT TERM GOAL #1   Title Pt will be independent with  HEP to facilitate RLE strength and balance to progress to gym based program.    Time 4    Period Weeks    Status On-going    Target Date 06/29/21      PT SHORT TERM GOAL #2   Title Pt will be able to ascend/descend 5 steps independently w/ use of rail and reciprocal pattern demonstraiting improved functional mobility to allow for safe entry/egress from home.    Baseline 11/22- pt ascended/descended 8 steps    Time 4    Period Weeks    Status Achieved    Target Date 06/29/21               PT Long Term Goals - 06/01/21 1146       PT LONG TERM GOAL #1   Title Pt will improve FGA score to >/= 25/30 demonstrating a low fall risk to improve community/household ambulation.(LTG-07/27/21)    Baseline 06/01/21- 18/30    Time 8    Period Weeks    Status New    Target Date 07/27/21      PT LONG TERM GOAL #2   Title Pt will demonstrate >/= 4/5 strength in RLE demonstrating improved strength to faciltate pt's functional mobility.    Period Weeks    Status New    Target Date 07/27/21      PT LONG TERM GOAL #3   Title Pt will be able to ascend/descend a flight of stair with the use of one handrail mod I with reciprocal pattern to facilitate improved community ambulation and improved functional strength.    Time 8    Period Weeks    Status New    Target Date 07/27/21      PT LONG TERM GOAL #4   Title Pt will be able to ambulate >1059ft safely Ind on varied surfaces safely to improve community  ambulation.  Time 8    Period Weeks    Status New    Target Date 07/27/21                   Plan - 07/11/21 1108     Clinical Impression Statement Today's session continued to focus on strengthening pt's RLE. Pt still demonstrates w/ deficits in RLE strength and control as he presents w/ instability in walking and w/ strengthening activities. PT will continue to progress pt as tolerated w/ activities towards achieving pt LTG.    Personal Factors and Comorbidities Comorbidity 3+;Time since onset of injury/illness/exacerbation    Comorbidities MS, Asperger's, Bipolar Disease, HTN    Examination-Activity Limitations Stand;Stairs;Squat;Reach Overhead;Locomotion Level;Transfers    Examination-Participation Restrictions Community Activity;Yard Work;Cleaning    Stability/Clinical Decision Making Evolving/Moderate complexity    Rehab Potential Good    PT Frequency 1x / week   plus eval   PT Duration 8 weeks    PT Treatment/Interventions ADLs/Self Care Home Management;Neuromuscular re-education;Manual techniques;Balance training;Therapeutic exercise;Therapeutic activities;DME Instruction;Functional mobility training;Stair training;Gait training;Patient/family education;Orthotic Fit/Training;Passive range of motion    PT Next Visit Plan Continue eccentric step downs, resisted walking, Leg Press. Continue to incorporate activites he can complete at [facility]. BLE strengthening. SLS activities. SciFit/NuStep    Consulted and Agree with Plan of Care Patient             Patient will benefit from skilled therapeutic intervention in order to improve the following deficits and impairments:  Abnormal gait, Decreased coordination, Decreased mobility, Decreased strength, Decreased balance, Decreased safety awareness  Visit Diagnosis: Muscle weakness (generalized)  Unsteadiness on feet     Problem List Patient Active Problem List   Diagnosis Date Noted   Bipolar disease, chronic ([place])  06/20/2021   Slurred speech 02/26/2021   Autism 12/20/2020   Multiple sclerosis ([place]) 10/11/2020   MRI of brain abnormal 10/11/2020   Right sided weakness 10/11/2020   Gait disturbance 10/11/2020   Asperger syndrome 03/19/2013    Lottie Mussel, Student-PT 07/11/2021, 11:10 AM  [facility] [address] [place], [place], [address] Phone: [phone]   Fax:  [phone]  Name: George [name] MRN: [id number] Date of Birth: 01/06/1971

## 2021-07-11 NOTE — Therapy (Signed)
[facility] [address] [place], [place], [address] Phone: [phone]   Fax:  [phone]  Occupational Therapy Treatment  Patient Details  Name: George [name] MRN: [id number] Date of Birth: 1970/11/26 Referring Provider (OT): Arlice Colt, MD   Encounter Date: 07/11/2021   OT End of Session - 07/11/21 1104     Visit Number 6    Number of Visits 9    Date for OT Re-Evaluation 07/27/21    Authorization Type [employer]   $30 copay   Authorization Time Period $30 copay, VL:MN    OT Start Time 1102    OT Stop Time 1145    OT Time Calculation (min) 43 min    Activity Tolerance Patient tolerated treatment well    Behavior During Therapy [facility] for tasks assessed/performed             Past Medical History:  Diagnosis Date   Anxiety    Asperger's syndrome    Autistic spectrum disorder    Asperger's (per H&P Olivia Clelland, PA)   Bipolar 1 disorder ([place])    "no mood swings in 20 years"  on medication   GERD (gastroesophageal reflux disease)    Head injury 2020   Hypertension    Hypothyroidism    IBS (irritable bowel syndrome)     Past Surgical History:  Procedure Laterality Date   NO PAST SURGERIES     RADIOLOGY WITH ANESTHESIA N/A 05/16/2020   Procedure: MRI BRAIN WITH AND WITHOUT CONTRAST;  Surgeon: Radiologist, Medication, MD;  Location: [facility];  Service: Radiology;  Laterality: N/A;   RADIOLOGY WITH ANESTHESIA N/A 08/29/2020   Procedure: MRI WITH ANESTHESIA CERVICAL SPINE WITH AND WITHOUT CONTRAST, THORACIC SPINE WITH AND WITHOUT CONTRAST;  Surgeon: Radiologist, Medication, MD;  Location: [facility];  Service: Radiology;  Laterality: N/A;   RADIOLOGY WITH ANESTHESIA N/A 03/15/2021   Procedure: MRI BRAIN WITH AND WITHOUT, CERIVAL WITH AND WITHOUT;  Surgeon: Radiologist, Medication, MD;  Location: [facility];  Service: Radiology;  Laterality: N/A;    There were no vitals filed for this visit.    Subjective Assessment - 07/11/21 1103     Subjective  "my hand is a little messed up and I brought a keyboard like that one (standard keyboard)"    Pertinent History MS suspected in mid 30's, worsened in mid-2021; experienced increased difficulty w/ Rt hand coordination and Rt foot drop. PMH also includes ASD (Asperger's syndrome), biplolar disorder, anxiety, and HTN    Limitations High fall risk    Currently in Pain? No/denies    Pain Score 0-No pain              Hand Gripper: with RUE on level 2 with black spring. Pt picked up 1 inch blocks with gripper with min drops and min difficulty. Pt req'd freq breaks d/t low frustration tolerance.  In Hand Manipulation RUE with connect four pieces bring from palm to fingertips to place into frame/board. Min difficulty  Arm Bike: for conditioning and reciprocal movements on Level 2 for 8 minutes (forward and backward).                      OT Education - 07/11/21 1118     Education Details Issued red theraputty - see pt instructions    Person(s) Educated Patient    Methods Explanation;Handout    Comprehension Verbalized understanding              OT  Short Term Goals - 07/11/21 1110       OT SHORT TERM GOAL #1   Title Pt will be independent with intiial HEP for RUE    Baseline Currently has HEP designed for coordination    Time 4    Period Weeks    Status On-going    Target Date 06/29/21      OT SHORT TERM GOAL #2   Title Pt will verbalize understanding of adapted strategies and equipment PRN for increasing safety and independence with ADLs and IADLs (i.e. cutting devices, fall prevention, shower seat, etc)    Baseline interested in more information re: shower chair    Time 4    Period Weeks    Status Achieved   has gotten a rocker knife but reports not using it, gave information for shower chair that folds up per pt request     OT SHORT TERM GOAL #3   Title Pt will increase grip strength in RUE to 60  lbs or greater for increasing stability and functional use    Baseline R 55.9, L 96.7    Time 4    Period Weeks    Status On-going   54.8 lbs with RUE 07/11/21     OT SHORT TERM GOAL #4   Title Pt will obtain a lightweight object from mid level shelf with good body mechanics and no compensatory movements and with pain less than or equal to 1/10.    Time 4    Period Weeks    Status New      OT SHORT TERM GOAL #5   Title Pt will increase fine motor coordination in RUE by completing 9 hole peg test in 70 seconds or less.    Baseline R 75s L 25s    Time 4    Period Weeks    Status Achieved   63.16s RUE 07/11/21              OT Long Term Goals - 06/01/21 1012       OT LONG TERM GOAL #1   Title Pt will be independent with updated HEP.    Time 8    Period Weeks    Status New    Target Date 07/27/21      OT LONG TERM GOAL #2   Title Pt will achieve shoulder flexion of 150* or greater in RUE for increasing reach and decrease pain.    Baseline 130*    Time 8    Period Weeks    Status New      OT LONG TERM GOAL #3   Title Pt will improve grip strength in RUE to 65 lbs or greater for increase in stability and functional use in RUE.    Baseline R 55.9 lbs, L 96.7 lbs    Time 8    Period Weeks    Status New      OT LONG TERM GOAL #4   Title Pt will increase fine motor coordination in RUE and completing 9 hole peg test in 65 seconds or less.    Baseline R 75s L 25s    Time 8    Period Weeks    Status New                   Plan - 07/11/21 1139     Clinical Impression Statement Pt met 9 hole peg test goal today. Pt met 2/5 STGs and progressing towards remaining goals.    OT  Occupational Profile and History Detailed Assessment- Review of Records and additional review of physical, cognitive, psychosocial history related to current functional performance    Occupational performance deficits (Please refer to evaluation for details): ADL's;IADL's;Work;Leisure;Social  Participation    Body Structure / Function / Physical Skills ADL;Strength;Decreased knowledge of use of DME;Tone;Dexterity;Balance;Body mechanics;Proprioception;UE functional use;IADL;ROM;Coordination;Mobility;FMC;Decreased knowledge of precautions;Gait;GMC;Endurance    Cognitive Skills Attention;Safety Awareness;Problem Solve    Psychosocial Skills Interpersonal Interaction;Environmental  Adaptations    Rehab Potential Fair   Decreased awareness of deficits and reported min carryover of learned strategies   Clinical Decision Making Several treatment options, min-mod task modification necessary    Comorbidities Affecting Occupational Performance: Presence of comorbidities impacting occupational performance    Comorbidities impacting occupational performance description: ASD, bipolar disorder    Modification or Assistance to Complete Evaluation  Min-Moderate modification of tasks or assist with assess necessary to complete eval    OT Frequency 1x / week    OT Duration 8 weeks    OT Treatment/Interventions Self-care/ADL training;DME and/or AE instruction;Splinting;Therapeutic activities;Psychosocial skills training;Therapeutic exercise;Coping strategies training;Functional Mobility Training;Neuromuscular education;Passive range of motion;Visual/perceptual remediation/compensation;Patient/family education;Energy conservation;Manual Therapy;Balance training    Plan continue working on scap stabilization, RUE coordination and shoulder ROM (low reaching), start checking some STGs. Plan discharge 12/28    Recommended Other Services Currently receiving PT at this site    Consulted and Agree with Plan of Care Patient             Patient will benefit from skilled therapeutic intervention in order to improve the following deficits and impairments:   Body Structure / Function / Physical Skills: ADL, Strength, Decreased knowledge of use of DME, Tone, Dexterity, Balance, Body mechanics, Proprioception, UE  functional use, IADL, ROM, Coordination, Mobility, FMC, Decreased knowledge of precautions, Gait, GMC, Endurance Cognitive Skills: Attention, Safety Awareness, Problem Solve Psychosocial Skills: Interpersonal Interaction, Environmental  Adaptations   Visit Diagnosis: Unsteadiness on feet  Frontal lobe and executive function deficit  Other lack of coordination  Muscle weakness (generalized)  Visuospatial deficit  Attention and concentration deficit  Other abnormalities of gait and mobility    Problem List Patient Active Problem List   Diagnosis Date Noted   Bipolar disease, chronic ([place]) 06/20/2021   Slurred speech 02/26/2021   Autism 12/20/2020   Multiple sclerosis ([place]) 10/11/2020   MRI of brain abnormal 10/11/2020   Right sided weakness 10/11/2020   Gait disturbance 10/11/2020   Asperger syndrome 03/19/2013    Zachery Conch, OT 07/11/2021, 11:44 AM  [facility] [address] [place], [place], [address] Phone: [phone]   Fax:  [phone]  Name: MICHIEL SIVLEY MRN: [id number] Date of Birth: 02/11/1971

## 2021-07-16 ENCOUNTER — Ambulatory Visit: Payer: PPO | Admitting: Speech Pathology

## 2021-07-16 ENCOUNTER — Encounter: Payer: Self-pay | Admitting: Speech Pathology

## 2021-07-16 ENCOUNTER — Other Ambulatory Visit: Payer: Self-pay

## 2021-07-16 DIAGNOSIS — R1312 Dysphagia, oropharyngeal phase: Secondary | ICD-10-CM

## 2021-07-16 DIAGNOSIS — M6281 Muscle weakness (generalized): Secondary | ICD-10-CM | POA: Diagnosis not present

## 2021-07-16 NOTE — Patient Instructions (Signed)
   Really big - 10x each  Alternate OOOO-EEEE   AAA-OOOO  Pucker then swish your pucker  Fill you cheeks with air and swish the air  Press you lips flat and smile then relax  Great job giving yourself swallow challenges with liquid   I don't worry about you getting pna because your speech, voice and cough are very strong  Bring in a food you can eat next session

## 2021-07-16 NOTE — Therapy (Signed)
[facility] [address] [place], [place], [address] Phone: [phone]   Fax:  [phone]  Speech Language Pathology Treatment  Patient Details  Name: George [name] MRN: [id number] Date of Birth: 05/16/1971 Referring Provider (SLP): Britt Bottom ,MD   Encounter Date: 07/16/2021   End of Session - 07/16/21 1539     Visit Number 2    Number of Visits 5    Date for SLP Re-Evaluation 08/20/21    SLP Start Time 1601    SLP Stop Time  1525    SLP Time Calculation (min) 40 min    Activity Tolerance Patient tolerated treatment well             Past Medical History:  Diagnosis Date   Anxiety    Asperger's syndrome    Autistic spectrum disorder    Asperger's (per H&P Olivia Clelland, PA)   Bipolar 1 disorder ([place])    "no mood swings in 20 years"  on medication   GERD (gastroesophageal reflux disease)    Head injury 2020   Hypertension    Hypothyroidism    IBS (irritable bowel syndrome)     Past Surgical History:  Procedure Laterality Date   NO PAST SURGERIES     RADIOLOGY WITH ANESTHESIA N/A 05/16/2020   Procedure: MRI BRAIN WITH AND WITHOUT CONTRAST;  Surgeon: Radiologist, Medication, MD;  Location: [facility];  Service: Radiology;  Laterality: N/A;   RADIOLOGY WITH ANESTHESIA N/A 08/29/2020   Procedure: MRI WITH ANESTHESIA CERVICAL SPINE WITH AND WITHOUT CONTRAST, THORACIC SPINE WITH AND WITHOUT CONTRAST;  Surgeon: Radiologist, Medication, MD;  Location: [facility];  Service: Radiology;  Laterality: N/A;   RADIOLOGY WITH ANESTHESIA N/A 03/15/2021   Procedure: MRI BRAIN WITH AND WITHOUT, CERIVAL WITH AND WITHOUT;  Surgeon: Radiologist, Medication, MD;  Location: [facility];  Service: Radiology;  Laterality: N/A;    There were no vitals filed for this visit.   Subjective Assessment - 07/16/21 1451     Subjective "Apparenly my swallow is not from MS, but from neural pathways"    Currently in Pain? No/denies                    ADULT SLP TREATMENT - 07/16/21 1456       General Information   Behavior/Cognition Alert;Cooperative;Impulsive;Distractible;Requires cueing;Agitated      Treatment Provided   Treatment provided Dysphagia      Dysphagia Treatment   Temperature Spikes Noted No    Respiratory Status Room air    Oral Cavity - Dentition Adequate natural dentition    Patient observed directly with PO's Yes    Type of PO's observed Thin liquids   Pt on special diet, so he will bring in his own food next time   Feeding Able to feed self    Liquids provided via Cup    Type of cueing Verbal    Other treatment/comments Encouraged Kieon to take challege, larger sips of water to train pathways for normal swallow. Neeko verbalizes "if I think about it, I get choked, but if I don't think about it I do fine" He verbalixed concerned about facial droop and requests exercises - gave 2 exercises - see pt instructions.      Pain Assessment   Pain Assessment No/denies pain      Dysphagia Recommendations   Diet recommendations Regular;Thin liquid    Liquids provided via Cup    Medication Administration Whole meds with liquid  Supervision Patient able to self feed    Postural Changes and/or Swallow Maneuvers Seated upright 90 degrees;Upright 30-60 min after meal      Progression Toward Goals   Progression toward goals Progressing toward goals                SLP Short Term Goals - 07/16/21 1538       SLP SHORT TERM GOAL #1   Title Pt will participate in Modified Barium Swallow (MBS) study to fully assess physiology and anatomy of the swallow and to determine the appropriate diet and/or rehabilitation exercises.    Time 1    Period Months    Status Achieved    Target Date 07/20/21              SLP Long Term Goals - 07/16/21 1538       SLP LONG TERM GOAL #1   Title Patient will maintain adequate hydration/nutrition with optimum safety and efficiency of swallowing  function on P.O. intake without overt signs and symptoms of aspiration for the highest appropriate diet level.    Time 4    Period Weeks    Status New      SLP LONG TERM GOAL #2   Title Pt will report 50% (subjectively) improvement in ease and confidence with eating    Time 4    Period Weeks    Status New              Plan - 07/16/21 1535     Clinical Impression Statement Deangleo reports reduced dysphagia when he doesn't think about swallowing. Encouraged larger sips and bites. He did not bring in food for his special diet, so solids not addressed today. Mild droop - pt requested exercises for this, provided 2 exercises. Tolerated larger, somewhat faster liquid sips today without difficulty. Continue skilled ST to maximize ease of swallow.    Speech Therapy Frequency 1x /week    Duration 4 weeks    Treatment/Interventions Aspiration precaution training;Trials of upgraded texture/liquids;Oral motor exercises;Compensatory strategies;Pharyngeal strengthening exercises;Cueing hierarchy;Patient/family education;Diet toleration management by SLP;SLP instruction and feedback    Potential Considerations Co-morbidities;Previous level of function;Ability to learn/carryover information;Cooperation/participation level             Patient will benefit from skilled therapeutic intervention in order to improve the following deficits and impairments:   Dysphagia, oropharyngeal phase    Problem List Patient Active Problem List   Diagnosis Date Noted   Bipolar disease, chronic ([place]) 06/20/2021   Slurred speech 02/26/2021   Autism 12/20/2020   Multiple sclerosis ([place]) 10/11/2020   MRI of brain abnormal 10/11/2020   Right sided weakness 10/11/2020   Gait disturbance 10/11/2020   Asperger syndrome 03/19/2013    Remedy Corporan, Annye Rusk, CCC-SLP 07/16/2021, 3:39 PM  [facility] [address] [place], [place], [address] Phone:  [phone]   Fax:  [phone]   Name: CLOIS TREANOR MRN: [id number] Date of Birth: 07/02/1971

## 2021-07-18 ENCOUNTER — Encounter: Payer: Self-pay | Admitting: Occupational Therapy

## 2021-07-18 ENCOUNTER — Ambulatory Visit: Payer: PPO | Admitting: Occupational Therapy

## 2021-07-18 ENCOUNTER — Other Ambulatory Visit: Payer: Self-pay

## 2021-07-18 ENCOUNTER — Ambulatory Visit: Payer: PPO

## 2021-07-18 DIAGNOSIS — R41842 Visuospatial deficit: Secondary | ICD-10-CM

## 2021-07-18 DIAGNOSIS — R2681 Unsteadiness on feet: Secondary | ICD-10-CM

## 2021-07-18 DIAGNOSIS — M6281 Muscle weakness (generalized): Secondary | ICD-10-CM | POA: Diagnosis not present

## 2021-07-18 DIAGNOSIS — R2689 Other abnormalities of gait and mobility: Secondary | ICD-10-CM

## 2021-07-18 DIAGNOSIS — R41844 Frontal lobe and executive function deficit: Secondary | ICD-10-CM

## 2021-07-18 DIAGNOSIS — R278 Other lack of coordination: Secondary | ICD-10-CM

## 2021-07-18 DIAGNOSIS — R41840 Attention and concentration deficit: Secondary | ICD-10-CM

## 2021-07-18 NOTE — Therapy (Signed)
[facility] [address] [place], [place], [address] Phone: [phone]   Fax:  [phone]  Physical Therapy Treatment  Patient Details  Name: George [name] MRN: [id number] Date of Birth: [birth year] Referring Provider (PT): Arlice Colt, MD   Encounter Date: 07/18/2021   PT End of Session - 07/18/21 1314     Visit Number 6    Number of Visits 9    Date for PT Re-Evaluation 07/27/21    Authorization Type [employer]    PT Start Time 1015    PT Stop Time 1057    PT Time Calculation (min) 42 min    Activity Tolerance Patient tolerated treatment well    Behavior During Therapy Impulsive             Past Medical History:  Diagnosis Date   Anxiety    Asperger's syndrome    Autistic spectrum disorder    Asperger's (per H&P Olivia Clelland, PA)   Bipolar 1 disorder ([place])    "no mood swings in 20 years"  on medication   GERD (gastroesophageal reflux disease)    Head injury 2020   Hypertension    Hypothyroidism    IBS (irritable bowel syndrome)     Past Surgical History:  Procedure Laterality Date   NO PAST SURGERIES     RADIOLOGY WITH ANESTHESIA N/A 05/16/2020   Procedure: MRI BRAIN WITH AND WITHOUT CONTRAST;  Surgeon: Radiologist, Medication, MD;  Location: [facility];  Service: Radiology;  Laterality: N/A;   RADIOLOGY WITH ANESTHESIA N/A 08/29/2020   Procedure: MRI WITH ANESTHESIA CERVICAL SPINE WITH AND WITHOUT CONTRAST, THORACIC SPINE WITH AND WITHOUT CONTRAST;  Surgeon: Radiologist, Medication, MD;  Location: [facility];  Service: Radiology;  Laterality: N/A;   RADIOLOGY WITH ANESTHESIA N/A 03/15/2021   Procedure: MRI BRAIN WITH AND WITHOUT, CERIVAL WITH AND WITHOUT;  Surgeon: Radiologist, Medication, MD;  Location: [facility];  Service: Radiology;  Laterality: N/A;    There were no vitals filed for this visit.   Subjective Assessment - 07/18/21 1017     Subjective Pt reports his hip is feeling better  today. Pt reports still going to the gym but not doing his hand exercises and balance exercises as often as he states there are so many.    Pertinent History h/o Asperger syndrome and bipolar disease    Limitations Standing;Walking;House hold activities    Patient Stated Goals Pt would like to be able to walk straight and live a long life.    Currently in Pain? No/denies    Pain Onset More than a month ago                               [facility] Adult PT Treatment/Exercise - 07/18/21 1020       Transfers   Transfers Sit to Stand;Stand to Sit    Sit to Stand 7: Independent    Stand to Sit 7: Independent      Ambulation/Gait   Ambulation/Gait No    Ambulation/Gait Assistance 5: Supervision    Ambulation Distance (Feet) 460 Feet    Assistive device None    Gait Pattern Decreased arm swing - right;Decreased arm swing - left;Step-through pattern;Decreased weight shift to right;Decreased stance time - right   Buckling improved w/ cues to focus on RLE heel strike.   Ambulation Surface Level;Indoor    Gait Comments Pt completed 4  laps of resisted walking forward,  backward, and laterally both sides each. PT verbally cued pt to take bigger slower steps to control the resistance. Pt had increased difficulty controlling RLE w/ stepping but improved when cued by PT to focus on having heel strike. PT provided min guard.      Exercises   Exercises Other Exercises    Other Exercises  Pt performed standing hamstring curls at counter w/ YTB around ankles 1x8 and then 2x5 due to pt reporting nausea. PT took blood pressure and it was 131/85 and provided pt water. Pt reported feeling better after extended rest breaks and after drinking water. Pt required UE assist for balance and PT min guard.      Knee/Hip Exercises: Machines for Strengthening   Total Gym Leg Press Single RLE leg press 45lbs 3x10. PT cued pt to control the weight throughout ROM.                       PT  Short Term Goals - 06/26/21 1247       PT SHORT TERM GOAL #1   Title Pt will be independent with  HEP to facilitate RLE strength and balance to progress to gym based program.    Time 4    Period Weeks    Status On-going    Target Date 06/29/21      PT SHORT TERM GOAL #2   Title Pt will be able to ascend/descend 5 steps independently w/ use of rail and reciprocal pattern demonstraiting improved functional mobility to allow for safe entry/egress from home.    Baseline 11/22- pt ascended/descended 8 steps    Time 4    Period Weeks    Status Achieved    Target Date 06/29/21               PT Long Term Goals - 06/01/21 1146       PT LONG TERM GOAL #1   Title Pt will improve FGA score to >/= 25/30 demonstrating a low fall risk to improve community/household ambulation.(LTG-07/27/21)    Baseline 06/01/21- 18/30    Time 8    Period Weeks    Status New    Target Date 07/27/21      PT LONG TERM GOAL #2   Title Pt will demonstrate >/= 4/5 strength in RLE demonstrating improved strength to faciltate pt's functional mobility.    Period Weeks    Status New    Target Date 07/27/21      PT LONG TERM GOAL #3   Title Pt will be able to ascend/descend a flight of stair with the use of one handrail mod I with reciprocal pattern to facilitate improved community ambulation and improved functional strength.    Time 8    Period Weeks    Status New    Target Date 07/27/21      PT LONG TERM GOAL #4   Title Pt will be able to ambulate >1050ft safely Ind on varied surfaces safely to improve community ambulation.    Time 8    Period Weeks    Status New    Target Date 07/27/21                   Plan - 07/18/21 1314     Clinical Impression Statement Today's session focused on continued RLE strengthening. Pt presented to the clinic with nausea possibly due to self-reports of drinking too much caffeine before appointment. Pt was able to tolerate today's session w/  increased rest  and wter breaks. Pt stated he felt better at the end of session. Pt still presents w/ deficits in RLE strength and control w/ gait. PT will continue to progress POC as tolerated.    Personal Factors and Comorbidities Comorbidity 3+;Time since onset of injury/illness/exacerbation    Comorbidities MS, Asperger's, Bipolar Disease, HTN    Examination-Activity Limitations Stand;Stairs;Squat;Reach Overhead;Locomotion Level;Transfers    Examination-Participation Restrictions Community Activity;Yard Work;Cleaning    Stability/Clinical Decision Making Evolving/Moderate complexity    Rehab Potential Good    PT Frequency 1x / week   plus eval   PT Duration 8 weeks    PT Treatment/Interventions ADLs/Self Care Home Management;Neuromuscular re-education;Manual techniques;Balance training;Therapeutic exercise;Therapeutic activities;DME Instruction;Functional mobility training;Stair training;Gait training;Patient/family education;Orthotic Fit/Training;Passive range of motion    PT Next Visit Plan LTG due next week, Continue eccentric step downs, resisted walking, Leg Press. Continue to incorporate activites he can complete at [facility]. BLE strengthening. SLS activities. SciFit/NuStep    Consulted and Agree with Plan of Care Patient             Patient will benefit from skilled therapeutic intervention in order to improve the following deficits and impairments:  Abnormal gait, Decreased coordination, Decreased mobility, Decreased strength, Decreased balance, Decreased safety awareness  Visit Diagnosis: Unsteadiness on feet  Muscle weakness (generalized)     Problem List Patient Active Problem List   Diagnosis Date Noted   Bipolar disease, chronic ([place]) 06/20/2021   Slurred speech 02/26/2021   Autism 12/20/2020   Multiple sclerosis ([place]) 10/11/2020   MRI of brain abnormal 10/11/2020   Right sided weakness 10/11/2020   Gait disturbance 10/11/2020   Asperger syndrome 03/19/2013    Lottie Mussel,  Student-PT 07/18/2021, 1:18 PM  [facility] [address] [place], [place], [address] Phone: [phone]   Fax:  [phone]  Name: George [name] MRN: [id number] Date of Birth: 1970/12/26

## 2021-07-18 NOTE — Therapy (Signed)
[facility] [address] [place], [place], [address] Phone: [phone]   Fax:  [phone]  Occupational Therapy Treatment  Patient Details  Name: George [name] MRN: [id number] Date of Birth: 1970-08-31 Referring Provider (OT): Arlice Colt, MD   Encounter Date: 07/18/2021   OT End of Session - 07/18/21 1059     Visit Number 7    Number of Visits 9   pt with missed appt d/t flu   Date for OT Re-Evaluation 07/27/21    Authorization Type [employer]   $30 copay   Authorization Time Period $30 copay, VL:MN    OT Start Time 1059    OT Stop Time 1111   pt left session early   OT Time Calculation (min) 12 min    Activity Tolerance Patient tolerated treatment well    Behavior During Therapy [facility] for tasks assessed/performed             Past Medical History:  Diagnosis Date   Anxiety    Asperger's syndrome    Autistic spectrum disorder    Asperger's (per H&P Olivia Clelland, PA)   Bipolar 1 disorder ([place])    "no mood swings in 20 years"  on medication   GERD (gastroesophageal reflux disease)    Head injury 2020   Hypertension    Hypothyroidism    IBS (irritable bowel syndrome)     Past Surgical History:  Procedure Laterality Date   NO PAST SURGERIES     RADIOLOGY WITH ANESTHESIA N/A 05/16/2020   Procedure: MRI BRAIN WITH AND WITHOUT CONTRAST;  Surgeon: Radiologist, Medication, MD;  Location: [facility];  Service: Radiology;  Laterality: N/A;   RADIOLOGY WITH ANESTHESIA N/A 08/29/2020   Procedure: MRI WITH ANESTHESIA CERVICAL SPINE WITH AND WITHOUT CONTRAST, THORACIC SPINE WITH AND WITHOUT CONTRAST;  Surgeon: Radiologist, Medication, MD;  Location: [facility];  Service: Radiology;  Laterality: N/A;   RADIOLOGY WITH ANESTHESIA N/A 03/15/2021   Procedure: MRI BRAIN WITH AND WITHOUT, CERIVAL WITH AND WITHOUT;  Surgeon: Radiologist, Medication, MD;  Location: [facility];  Service: Radiology;  Laterality: N/A;     There were no vitals filed for this visit.   Subjective Assessment - 07/18/21 1058     Subjective  "good - no changes" Pt denies any pain and changes.    Pertinent History MS suspected in mid 30's, worsened in mid-2021; experienced increased difficulty w/ Rt hand coordination and Rt foot drop. PMH also includes ASD (Asperger's syndrome), biplolar disorder, anxiety, and HTN    Limitations High fall risk    Currently in Pain? No/denies    Pain Score 0-No pain             Discussed plan of care and certification period.   Pt was ok with cancelling last session outside of cert period however patient became very upset and frustrated d/t not understanding the parameters set by insurance and certification periods. OT said we would check goals and see how he was doing and patient left appt and asked to cancel all remaining appts.   After discussing with front desk staff, patient was able to calm down, leave remaining appts in cert period and return for next appt on 12/21. Will check goals next session and determine need for further appts.   Pt needed the time to calm down therefore we did not resume session today. Concluded session and patient will return 12/21.   Of note, patient has switched between clinics within the [facility] system  several times for OT and PT.                       OT Short Term Goals - 07/11/21 1110       OT SHORT TERM GOAL #1   Title Pt will be independent with intiial HEP for RUE    Baseline Currently has HEP designed for coordination    Time 4    Period Weeks    Status On-going    Target Date 06/29/21      OT SHORT TERM GOAL #2   Title Pt will verbalize understanding of adapted strategies and equipment PRN for increasing safety and independence with ADLs and IADLs (i.e. cutting devices, fall prevention, shower seat, etc)    Baseline interested in more information re: shower chair    Time 4    Period Weeks    Status Achieved   has gotten a  rocker knife but reports not using it, gave information for shower chair that folds up per pt request     OT SHORT TERM GOAL #3   Title Pt will increase grip strength in RUE to 60 lbs or greater for increasing stability and functional use    Baseline R 55.9, L 96.7    Time 4    Period Weeks    Status On-going   54.8 lbs with RUE 07/11/21     OT SHORT TERM GOAL #4   Title Pt will obtain a lightweight object from mid level shelf with good body mechanics and no compensatory movements and with pain less than or equal to 1/10.    Time 4    Period Weeks    Status New      OT SHORT TERM GOAL #5   Title Pt will increase fine motor coordination in RUE by completing 9 hole peg test in 70 seconds or less.    Baseline R 75s L 25s    Time 4    Period Weeks    Status Achieved   63.16s RUE 07/11/21              OT Long Term Goals - 06/01/21 1012       OT LONG TERM GOAL #1   Title Pt will be independent with updated HEP.    Time 8    Period Weeks    Status New    Target Date 07/27/21      OT LONG TERM GOAL #2   Title Pt will achieve shoulder flexion of 150* or greater in RUE for increasing reach and decrease pain.    Baseline 130*    Time 8    Period Weeks    Status New      OT LONG TERM GOAL #3   Title Pt will improve grip strength in RUE to 65 lbs or greater for increase in stability and functional use in RUE.    Baseline R 55.9 lbs, L 96.7 lbs    Time 8    Period Weeks    Status New      OT LONG TERM GOAL #4   Title Pt will increase fine motor coordination in RUE and completing 9 hole peg test in 65 seconds or less.    Baseline R 75s L 25s    Time 8    Period Weeks    Status New                   Plan - 07/18/21  19     Clinical Impression Statement Pt upset today d/t decreaesd mental flexibility with understanding certification period and dates of appointments. Will assess goals and see if more sessions are needed vs discharge at next visit.    OT  Occupational Profile and History Detailed Assessment- Review of Records and additional review of physical, cognitive, psychosocial history related to current functional performance    Occupational performance deficits (Please refer to evaluation for details): ADL's;IADL's;Work;Leisure;Social Participation    Body Structure / Function / Physical Skills ADL;Strength;Decreased knowledge of use of DME;Tone;Dexterity;Balance;Body mechanics;Proprioception;UE functional use;IADL;ROM;Coordination;Mobility;FMC;Decreased knowledge of precautions;Gait;GMC;Endurance    Cognitive Skills Attention;Safety Awareness;Problem Solve    Psychosocial Skills Interpersonal Interaction;Environmental  Adaptations    Rehab Potential Fair   Decreased awareness of deficits and reported min carryover of learned strategies   Clinical Decision Making Several treatment options, min-mod task modification necessary    Comorbidities Affecting Occupational Performance: Presence of comorbidities impacting occupational performance    Comorbidities impacting occupational performance description: ASD, bipolar disorder    Modification or Assistance to Complete Evaluation  Min-Moderate modification of tasks or assist with assess necessary to complete eval    OT Frequency 1x / week    OT Duration 8 weeks    OT Treatment/Interventions Self-care/ADL training;DME and/or AE instruction;Splinting;Therapeutic activities;Psychosocial skills training;Therapeutic exercise;Coping strategies training;Functional Mobility Training;Neuromuscular education;Passive range of motion;Visual/perceptual remediation/compensation;Patient/family education;Energy conservation;Manual Therapy;Balance training    Plan check goals and plan discharge 12/21    Recommended Other Services Currently receiving PT at this site    Consulted and Agree with Plan of Care Patient             Patient will benefit from skilled therapeutic intervention in order to improve the  following deficits and impairments:   Body Structure / Function / Physical Skills: ADL, Strength, Decreased knowledge of use of DME, Tone, Dexterity, Balance, Body mechanics, Proprioception, UE functional use, IADL, ROM, Coordination, Mobility, FMC, Decreased knowledge of precautions, Gait, GMC, Endurance Cognitive Skills: Attention, Safety Awareness, Problem Solve Psychosocial Skills: Interpersonal Interaction, Environmental  Adaptations   Visit Diagnosis: Other abnormalities of gait and mobility  Unsteadiness on feet  Frontal lobe and executive function deficit  Other lack of coordination  Muscle weakness (generalized)  Attention and concentration deficit  Visuospatial deficit    Problem List Patient Active Problem List   Diagnosis Date Noted   Bipolar disease, chronic ([place]) 06/20/2021   Slurred speech 02/26/2021   Autism 12/20/2020   Multiple sclerosis ([place]) 10/11/2020   MRI of brain abnormal 10/11/2020   Right sided weakness 10/11/2020   Gait disturbance 10/11/2020   Asperger syndrome 03/19/2013    Zachery Conch, OT 07/18/2021, 12:09 PM  [facility] [address] [place], [place], [address] Phone: [phone]   Fax:  [phone]  Name: RIEL HIRSCHMAN MRN: [id number] Date of Birth: Apr 23, 1971

## 2021-07-25 ENCOUNTER — Other Ambulatory Visit: Payer: Self-pay

## 2021-07-25 ENCOUNTER — Ambulatory Visit: Payer: PPO

## 2021-07-25 ENCOUNTER — Ambulatory Visit: Payer: PPO | Admitting: Occupational Therapy

## 2021-07-25 DIAGNOSIS — R41840 Attention and concentration deficit: Secondary | ICD-10-CM

## 2021-07-25 DIAGNOSIS — R41842 Visuospatial deficit: Secondary | ICD-10-CM

## 2021-07-25 DIAGNOSIS — M6281 Muscle weakness (generalized): Secondary | ICD-10-CM

## 2021-07-25 DIAGNOSIS — R2689 Other abnormalities of gait and mobility: Secondary | ICD-10-CM

## 2021-07-25 DIAGNOSIS — R278 Other lack of coordination: Secondary | ICD-10-CM

## 2021-07-25 NOTE — Therapy (Signed)
[facility] [address] [place], [place], [address] Phone: [phone]   Fax:  [phone]  Occupational Therapy Treatment  Patient Details  Name: George [name] MRN: [id number] Date of Birth: 1970/12/11 Referring Provider (OT): Arlice Colt, MD   Encounter Date: 07/25/2021   OT End of Session - 07/25/21 1037     Visit Number 8    Number of Visits 9   pt with missed appt d/t flu   Date for OT Re-Evaluation 07/27/21    Authorization Type [employer]   $30 copay   Authorization Time Period $30 copay, VL:MN    OT Start Time 1016    OT Stop Time 1055    OT Time Calculation (min) 39 min    Activity Tolerance Patient tolerated treatment well    Behavior During Therapy [facility] for tasks assessed/performed             Past Medical History:  Diagnosis Date   Anxiety    Asperger's syndrome    Autistic spectrum disorder    Asperger's (per H&P Olivia Clelland, PA)   Bipolar 1 disorder ([place])    "no mood swings in 20 years"  on medication   GERD (gastroesophageal reflux disease)    Head injury 2020   Hypertension    Hypothyroidism    IBS (irritable bowel syndrome)     Past Surgical History:  Procedure Laterality Date   NO PAST SURGERIES     RADIOLOGY WITH ANESTHESIA N/A 05/16/2020   Procedure: MRI BRAIN WITH AND WITHOUT CONTRAST;  Surgeon: Radiologist, Medication, MD;  Location: [place];  Service: Radiology;  Laterality: N/A;   RADIOLOGY WITH ANESTHESIA N/A 08/29/2020   Procedure: MRI WITH ANESTHESIA CERVICAL SPINE WITH AND WITHOUT CONTRAST, THORACIC SPINE WITH AND WITHOUT CONTRAST;  Surgeon: Radiologist, Medication, MD;  Location: [place];  Service: Radiology;  Laterality: N/A;   RADIOLOGY WITH ANESTHESIA N/A 03/15/2021   Procedure: MRI BRAIN WITH AND WITHOUT, CERIVAL WITH AND WITHOUT;  Surgeon: Radiologist, Medication, MD;  Location: [place];  Service: Radiology;  Laterality: N/A;    There were no vitals  filed for this visit.   Subjective Assessment - 07/25/21 1018     Subjective  I've been doing pretty well    Pertinent History MS suspected in mid 30's, worsened in mid-2021; experienced increased difficulty w/ Rt hand coordination and Rt foot drop. PMH also includes ASD (Asperger's syndrome), biplolar disorder, anxiety, and HTN    Limitations High fall risk    Currently in Pain? No/denies            Assessed goals and progress to date - see below for progress towards goals.  9 hole peg test = 65 sec Grip = 49 lbs, 51 lbs (54 las week) Rt shoulder flex b/t 150-160*  Pt instructed to have palms touching for shoulder flex supine (vs. Holding thumbs)  Discussed task modifications for making bed  Pt placing medium sized pegs in pegboard Rt hand, then removing w/ min difficulty                        OT Short Term Goals - 07/25/21 1038       OT SHORT TERM GOAL #1   Title Pt will be independent with intiial HEP for RUE    Baseline Currently has HEP designed for coordination    Time 4    Period Weeks    Status Achieved    Target  Date 06/29/21      OT SHORT TERM GOAL #2   Title Pt will verbalize understanding of adapted strategies and equipment PRN for increasing safety and independence with ADLs and IADLs (i.e. cutting devices, fall prevention, shower seat, etc)    Baseline interested in more information re: shower chair    Time 4    Period Weeks    Status Achieved   has gotten a rocker knife but reports not using it, gave information for shower chair that folds up per pt request     OT SHORT TERM GOAL #3   Title Pt will increase grip strength in RUE to 60 lbs or greater for increasing stability and functional use    Baseline R 55.9, L 96.7    Time 4    Period Weeks    Status Not Met   54.8 lbs with RUE 07/11/21, 49 lbs 07/25/21     OT SHORT TERM GOAL #4   Title Pt will obtain a lightweight object from mid level shelf with good body mechanics and no  compensatory movements and with pain less than or equal to 1/10.    Time 4    Period Weeks    Status Achieved      OT SHORT TERM GOAL #5   Title Pt will increase fine motor coordination in RUE by completing 9 hole peg test in 70 seconds or less.    Baseline R 75s L 25s    Time 4    Period Weeks    Status Achieved   63.16s RUE 07/11/21              OT Long Term Goals - 07/25/21 1039       OT LONG TERM GOAL #1   Title Pt will be independent with updated HEP.    Time 8    Period Weeks    Status Achieved    Target Date 07/27/21      OT LONG TERM GOAL #2   Title Pt will achieve shoulder flexion of 150* or greater in RUE for increasing reach and decrease pain.    Baseline 130*    Time 8    Period Weeks    Status Achieved   150-160*     OT LONG TERM GOAL #3   Title Pt will improve grip strength in RUE to 65 lbs or greater for increase in stability and functional use in RUE.    Baseline R 55.9 lbs, L 96.7 lbs    Time 8    Period Weeks    Status Not Met   between 49 - 54 lbs     OT LONG TERM GOAL #4   Title Pt will increase fine motor coordination in RUE and completing 9 hole peg test in 65 seconds or less.    Baseline R 75s L 25s    Time 8    Period Weeks    Status Achieved   65 sec     OT LONG TERM GOAL #5   Title Pt will safely complete IADL task, including functional mobility and standing tolerance components prn, w/ Mod I by discharge    Status Partially Met   washing dishes, loading/unloading dishwasher                  Plan - 07/25/21 1047     Clinical Impression Statement Pt met 4/5 LTG's w/ inconsistent grip strength. Pt fluctuates in status partly based on frustration level and partly d/t  disease process itself    OT Occupational Profile and History Detailed Assessment- Review of Records and additional review of physical, cognitive, psychosocial history related to current functional performance    Occupational performance deficits (Please refer to  evaluation for details): ADL's;IADL's;Work;Leisure;Social Participation    Body Structure / Function / Physical Skills ADL;Strength;Decreased knowledge of use of DME;Tone;Dexterity;Balance;Body mechanics;Proprioception;UE functional use;IADL;ROM;Coordination;Mobility;FMC;Decreased knowledge of precautions;Gait;GMC;Endurance    Cognitive Skills Attention;Safety Awareness;Problem Solve    Psychosocial Skills Interpersonal Interaction;Environmental  Adaptations    Rehab Potential Fair   Decreased awareness of deficits and reported min carryover of learned strategies   Clinical Decision Making Several treatment options, min-mod task modification necessary    Comorbidities Affecting Occupational Performance: Presence of comorbidities impacting occupational performance    Comorbidities impacting occupational performance description: ASD, bipolar disorder    Modification or Assistance to Complete Evaluation  Min-Moderate modification of tasks or assist with assess necessary to complete eval    OT Frequency 1x / week    OT Duration 8 weeks    OT Treatment/Interventions Self-care/ADL training;DME and/or AE instruction;Splinting;Therapeutic activities;Psychosocial skills training;Therapeutic exercise;Coping strategies training;Functional Mobility Training;Neuromuscular education;Passive range of motion;Visual/perceptual remediation/compensation;Patient/family education;Energy conservation;Manual Therapy;Balance training    Plan D/C O.T.    Recommended Other Services Currently receiving PT at this site    Consulted and Agree with Plan of Care Patient             Patient will benefit from skilled therapeutic intervention in order to improve the following deficits and impairments:   Body Structure / Function / Physical Skills: ADL, Strength, Decreased knowledge of use of DME, Tone, Dexterity, Balance, Body mechanics, Proprioception, UE functional use, IADL, ROM, Coordination, Mobility, FMC, Decreased  knowledge of precautions, Gait, GMC, Endurance Cognitive Skills: Attention, Safety Awareness, Problem Solve Psychosocial Skills: Interpersonal Interaction, Environmental  Adaptations   Visit Diagnosis: Other lack of coordination  Muscle weakness (generalized)  Attention and concentration deficit  Visuospatial deficit    Problem List Patient Active Problem List   Diagnosis Date Noted   Bipolar disease, chronic ([place]) 06/20/2021   Slurred speech 02/26/2021   Autism 12/20/2020   Multiple sclerosis ([place]) 10/11/2020   MRI of brain abnormal 10/11/2020   Right sided weakness 10/11/2020   Gait disturbance 10/11/2020   Asperger syndrome 03/19/2013    OCCUPATIONAL THERAPY DISCHARGE SUMMARY  Visits from Start of Care: 8  Current functional level related to goals / functional outcomes: See above   Remaining deficits: RUE coordination and control RUE strength Balance and functional mobility Occasional Rt shoulder pain    Education / Equipment: HEP's, task modifications, A/E recommendations   Patient agrees to discharge. Patient goals were partially met. Patient is being discharged due to maximized rehab potential. .     Carey Bullocks, OTR/L 07/25/2021, 11:09 AM  [facility] [address] [place], [place], [address] Phone: [phone]   Fax:  [phone]  Name: George [name] MRN: [id number] Date of Birth: [birth year]
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
## 2021-07-27 ENCOUNTER — Other Ambulatory Visit: Payer: Self-pay

## 2021-07-27 ENCOUNTER — Ambulatory Visit: Payer: PPO

## 2021-07-27 DIAGNOSIS — R1312 Dysphagia, oropharyngeal phase: Secondary | ICD-10-CM

## 2021-07-27 DIAGNOSIS — M6281 Muscle weakness (generalized): Secondary | ICD-10-CM | POA: Diagnosis not present

## 2021-07-27 NOTE — Therapy (Signed)
[facility] [address] [place], [place], [address] Phone: [phone]   Fax:  [phone]  Speech Language Pathology Treatment/Discharge Summary  Patient Details  Name: George [name] MRN: [id number] Date of Birth: 07-Oct-1970 Referring Provider (SLP): Britt Bottom ,MD   Encounter Date: 07/27/2021   End of Session - 07/27/21 1215     Visit Number 3    Number of Visits 5    Date for SLP Re-Evaluation 08/20/21    SLP Start Time 1215    SLP Stop Time  1245    SLP Time Calculation (min) 30 min    Activity Tolerance Patient tolerated treatment well             Past Medical History:  Diagnosis Date   Anxiety    Asperger's syndrome    Autistic spectrum disorder    Asperger's (per H&P Olivia Clelland, PA)   Bipolar 1 disorder ([place])    "no mood swings in 20 years"  on medication   GERD (gastroesophageal reflux disease)    Head injury 2020   Hypertension    Hypothyroidism    IBS (irritable bowel syndrome)     Past Surgical History:  Procedure Laterality Date   NO PAST SURGERIES     RADIOLOGY WITH ANESTHESIA N/A 05/16/2020   Procedure: MRI BRAIN WITH AND WITHOUT CONTRAST;  Surgeon: Radiologist, Medication, MD;  Location: [facility];  Service: Radiology;  Laterality: N/A;   RADIOLOGY WITH ANESTHESIA N/A 08/29/2020   Procedure: MRI WITH ANESTHESIA CERVICAL SPINE WITH AND WITHOUT CONTRAST, THORACIC SPINE WITH AND WITHOUT CONTRAST;  Surgeon: Radiologist, Medication, MD;  Location: [facility];  Service: Radiology;  Laterality: N/A;   RADIOLOGY WITH ANESTHESIA N/A 03/15/2021   Procedure: MRI BRAIN WITH AND WITHOUT, CERIVAL WITH AND WITHOUT;  Surgeon: Radiologist, Medication, MD;  Location: [facility];  Service: Radiology;  Laterality: N/A;    There were no vitals filed for this visit.   Subjective Assessment - 07/27/21 1217     Subjective "I've been trying to eat normally"    Currently in Pain? No/denies               SPEECH THERAPY DISCHARGE SUMMARY  Visits from Start of Care: 3  Current functional level related to goals / functional outcomes: George [name] now feels more confident and comfortable swallowing following MBSS and short course of ST intervention. Pt aware fear and anxiety may play contributing factor, in which pt reportedly swallows better if he doesn't focus on it. Pt has implemented compensations to cut food into smaller pieces, chew longer, and take smaller bites. Pt is agreeable to ST discharge today as pt is pleased with current progress.   Remaining deficits: MS   Education / Equipment: MBSS results, recommendations, oral motor exercises   Patient agrees to discharge. Patient goals were met. Patient is being discharged due to pt is pleased with current progress.        ADULT SLP TREATMENT - 07/27/21 1215       General Information   Behavior/Cognition Alert;Cooperative;Impulsive;Distractible;Requires cueing;Agitated      Treatment Provided   Treatment provided Dysphagia      Dysphagia Treatment   Temperature Spikes Noted No    Respiratory Status Room air    Oral Cavity - Dentition Adequate natural dentition    Patient observed directly with PO's Yes    Type of PO's observed Regular;Thin liquids    Feeding Able to feed self    Liquids provided via  Cup    Type of cueing Verbal    Amount of cueing Modified independent    Other treatment/comments Pt arrived agitated related to outside incidence and required redirection to reduce anxious behaviors. George [name] provided preferred snack of crackers and nuts to trial. Select bites consumed per patient preference. No immediate overt s/sx of aspiraiton exhibited. Some delayed coughing exhibited, which pt contributed to talking after snack. Further PO trials later in the session revealed no overt s/sx of aspiration. Initial anxiety may have been contributing factor as well. Pt denies any coughing or choking episodes since previously  reported at eval. Pt feels more confident and comfortable with his swallowing following MBSS. Pt indicated he has been cooking harder foods to make them softer, masticating more, and taking smaller bites. Pt would like to discharge from [facility] today as pt is pleased with current progress.      Assessment / Recommendations / Plan   Plan Discharge SLP treatment due to (comment)   pt agitated with coming due to disruption in painting; pleased with current progress     Dysphagia Recommendations   Diet recommendations Regular;Thin liquid    Liquids provided via Cup    Medication Administration Whole meds with liquid    Supervision Patient able to self feed    Compensations Slow rate    Postural Changes and/or Swallow Maneuvers Seated upright 90 degrees;Upright 30-60 min after meal      Progression Toward Goals   Progression toward goals Goals met, education completed, patient discharged from [facility] Education - 07/27/21 1236     Education Details swallow precautions and recommendations    Person(s) Educated Patient    Methods Explanation;Demonstration    Comprehension Verbalized understanding;Returned demonstration              SLP Short Term Goals - 07/16/21 1538       SLP SHORT TERM GOAL #1   Title Pt will participate in Modified Barium Swallow (MBS) study to fully assess physiology and anatomy of the swallow and to determine the appropriate diet and/or rehabilitation exercises.    Time 1    Period Months    Status Achieved    Target Date 07/20/21              SLP Long Term Goals - 07/27/21 1235       SLP LONG TERM GOAL #1   Title Patient will maintain adequate hydration/nutrition with optimum safety and efficiency of swallowing function on P.O. intake without overt signs and symptoms of aspiration for the highest appropriate diet level.    Time 4    Period Weeks    Status Achieved      SLP LONG TERM GOAL #2   Title Pt will report 50% (subjectively)  improvement in ease and confidence with eating    Time 4    Period Weeks    Status Achieved              Plan - 07/27/21 1224     Clinical Impression Statement George [name] reports reduced dysphagia when he doesn't think about swallowing. Encouraged larger sips and bites. He brought in special diet, so solids and liquids were addressed today. Rare delayed coughing noted following talking after eating. Coughing subsided and no other overt s/sx of aspiration exhibited. Pt continues to cut meat into smaller pieces and masticate food more. Mild droop reported in which pt will continue exercises for  this, provided 2 exercises. Pt is pleased with current progress and requested ST discharge today due to pt would like to focus on extracircular activities and is more confident with swallowing ability at this time.    Speech Therapy Frequency 1x /week    Duration 4 weeks    Treatment/Interventions Aspiration precaution training;Trials of upgraded texture/liquids;Oral motor exercises;Compensatory strategies;Pharyngeal strengthening exercises;Cueing hierarchy;Patient/family education;Diet toleration management by SLP;SLP instruction and feedback    Potential Considerations Co-morbidities;Previous level of function;Ability to learn/carryover information;Cooperation/participation level             Patient will benefit from skilled therapeutic intervention in order to improve the following deficits and impairments:   Dysphagia, oropharyngeal phase    Problem List Patient Active Problem List   Diagnosis Date Noted   Bipolar disease, chronic ([place]) 06/20/2021   Slurred speech 02/26/2021   Autism 12/20/2020   Multiple sclerosis ([place]) 10/11/2020   MRI of brain abnormal 10/11/2020   Right sided weakness 10/11/2020   Gait disturbance 10/11/2020   Asperger syndrome 03/19/2013    Alinda Deem, CCC-SLP 07/27/2021, 12:50 PM  [facility] [address] [place], [place], [address] Phone: [phone]   Fax:  [phone]   Name: George [name] MRN: [id number] Date of Birth: September 24, 1970

## 2021-07-31 DIAGNOSIS — E291 Testicular hypofunction: Secondary | ICD-10-CM | POA: Diagnosis not present

## 2021-08-01 ENCOUNTER — Ambulatory Visit: Payer: PPO

## 2021-08-01 ENCOUNTER — Encounter: Payer: PPO | Admitting: Occupational Therapy

## 2021-08-08 ENCOUNTER — Encounter: Payer: PPO | Admitting: Speech Pathology

## 2021-08-09 DIAGNOSIS — D179 Benign lipomatous neoplasm, unspecified: Secondary | ICD-10-CM | POA: Diagnosis not present

## 2021-08-15 ENCOUNTER — Encounter: Payer: PPO | Admitting: Speech Pathology

## 2021-08-21 DIAGNOSIS — E291 Testicular hypofunction: Secondary | ICD-10-CM | POA: Diagnosis not present

## 2021-08-22 ENCOUNTER — Encounter: Payer: PPO | Admitting: Speech Pathology

## 2021-08-23 DIAGNOSIS — G35 Multiple sclerosis: Secondary | ICD-10-CM | POA: Diagnosis not present

## 2021-08-23 DIAGNOSIS — Z7960 Long term (current) use of unspecified immunomodulators and immunosuppressants: Secondary | ICD-10-CM | POA: Diagnosis not present

## 2021-08-23 DIAGNOSIS — F909 Attention-deficit hyperactivity disorder, unspecified type: Secondary | ICD-10-CM | POA: Diagnosis not present

## 2021-08-23 DIAGNOSIS — F840 Autistic disorder: Secondary | ICD-10-CM | POA: Diagnosis not present

## 2021-08-23 DIAGNOSIS — F319 Bipolar disorder, unspecified: Secondary | ICD-10-CM | POA: Diagnosis not present

## 2021-08-29 ENCOUNTER — Encounter: Payer: PPO | Admitting: Speech Pathology

## 2021-09-03 ENCOUNTER — Telehealth: Payer: Self-pay | Admitting: Neurology

## 2021-09-03 NOTE — Telephone Encounter (Signed)
Called and spoke w/ pt. Pt not having any new MS sx. However, he has been having some atrophy of muscles. He just ended physical therapy 2-3 weeks ago. He is exercising 6 days per week. 15-20 min per day. Leg exercises, some arm exercises. Tries to focus more on right leg. When he first started PT, he would get out of bed and fall to the side. PT monitored this and it improved over time. Last night, this almost happened again. Intermittently, right leg will fall out from under him causing near fall. Aware he can have worse days than others w/ MS sx. Depends on physical activity completed that day as well. Fatigue can vary. MRIs from 03/15/21 showed no new lesions. Next f/u 12/19/21.  He also wanted Dr. Garth Bigness input on the following:  Told not to take any heavy stimulants d/t bipolar disorder. Dr. Felecia Shelling has recommended armodafinil/modafinil. However,  He is nervous about trying since these are scheduled drugs. Wants to know dangers of taking these medications. Psychiatrist told him it was ok. Spoke w/ another MS doctor who recommended another med: amantadine. Wanting to know if Dr. Felecia Shelling thinks this would be a good option for future? He does not feel he needs it now but wants to know if this is an option moving forward.    Aware I will send to MD for review and we will call back.

## 2021-09-03 NOTE — Telephone Encounter (Signed)
Pt called in wanting to know if he should have an MRI done as he states he has not had any more new signs of MS. Would like a call back from the nurse at [phone].

## 2021-09-03 NOTE — Telephone Encounter (Signed)
Called pt back. Relayed Dr. Garth Bigness message. He verbalized understanding.  He has decided he wants to stop caffeine before starting modafinil. He is taking in about 200-300mg  per day. He will slowly taper off and will update [place] if he would like to start modafinil in the future. Nothing further needed.

## 2021-09-03 NOTE — Telephone Encounter (Signed)
He was also concerned about continued atrophy even w/ PT/exercise. Anything you would recommend?

## 2021-09-05 ENCOUNTER — Encounter: Payer: PPO | Admitting: Speech Pathology

## 2021-09-07 DIAGNOSIS — I10 Essential (primary) hypertension: Secondary | ICD-10-CM | POA: Diagnosis not present

## 2021-09-07 DIAGNOSIS — E291 Testicular hypofunction: Secondary | ICD-10-CM | POA: Diagnosis not present

## 2021-09-07 DIAGNOSIS — E039 Hypothyroidism, unspecified: Secondary | ICD-10-CM | POA: Diagnosis not present

## 2021-09-07 DIAGNOSIS — Z125 Encounter for screening for malignant neoplasm of prostate: Secondary | ICD-10-CM | POA: Diagnosis not present

## 2021-09-12 ENCOUNTER — Ambulatory Visit: Payer: PPO | Admitting: Physician Assistant

## 2021-09-12 ENCOUNTER — Encounter: Payer: Self-pay | Admitting: Physician Assistant

## 2021-09-12 ENCOUNTER — Other Ambulatory Visit: Payer: Self-pay

## 2021-09-12 ENCOUNTER — Encounter: Payer: PPO | Admitting: Speech Pathology

## 2021-09-12 DIAGNOSIS — E039 Hypothyroidism, unspecified: Secondary | ICD-10-CM | POA: Diagnosis not present

## 2021-09-12 DIAGNOSIS — L918 Other hypertrophic disorders of the skin: Secondary | ICD-10-CM

## 2021-09-12 DIAGNOSIS — F845 Asperger's syndrome: Secondary | ICD-10-CM | POA: Diagnosis not present

## 2021-09-12 DIAGNOSIS — D234 Other benign neoplasm of skin of scalp and neck: Secondary | ICD-10-CM | POA: Diagnosis not present

## 2021-09-12 DIAGNOSIS — F319 Bipolar disorder, unspecified: Secondary | ICD-10-CM | POA: Diagnosis not present

## 2021-09-12 DIAGNOSIS — I10 Essential (primary) hypertension: Secondary | ICD-10-CM | POA: Diagnosis not present

## 2021-09-12 DIAGNOSIS — Z125 Encounter for screening for malignant neoplasm of prostate: Secondary | ICD-10-CM | POA: Diagnosis not present

## 2021-09-12 DIAGNOSIS — Z0000 Encounter for general adult medical examination without abnormal findings: Secondary | ICD-10-CM | POA: Diagnosis not present

## 2021-09-12 DIAGNOSIS — Z23 Encounter for immunization: Secondary | ICD-10-CM | POA: Diagnosis not present

## 2021-09-12 DIAGNOSIS — D485 Neoplasm of uncertain behavior of skin: Secondary | ICD-10-CM

## 2021-09-12 DIAGNOSIS — K589 Irritable bowel syndrome without diarrhea: Secondary | ICD-10-CM | POA: Diagnosis not present

## 2021-09-12 DIAGNOSIS — G35 Multiple sclerosis: Secondary | ICD-10-CM | POA: Diagnosis not present

## 2021-09-12 DIAGNOSIS — E291 Testicular hypofunction: Secondary | ICD-10-CM | POA: Diagnosis not present

## 2021-09-12 NOTE — Patient Instructions (Signed)

## 2021-09-17 ENCOUNTER — Other Ambulatory Visit: Payer: Self-pay | Admitting: Neurology

## 2021-09-17 DIAGNOSIS — H04123 Dry eye syndrome of bilateral lacrimal glands: Secondary | ICD-10-CM | POA: Diagnosis not present

## 2021-09-17 DIAGNOSIS — H5213 Myopia, bilateral: Secondary | ICD-10-CM | POA: Diagnosis not present

## 2021-09-26 DIAGNOSIS — E291 Testicular hypofunction: Secondary | ICD-10-CM | POA: Diagnosis not present

## 2021-10-02 ENCOUNTER — Ambulatory Visit: Payer: PPO | Admitting: Dermatology

## 2021-10-03 DIAGNOSIS — E291 Testicular hypofunction: Secondary | ICD-10-CM | POA: Diagnosis not present

## 2021-10-07 ENCOUNTER — Encounter: Payer: Self-pay | Admitting: Physician Assistant

## 2021-10-07 NOTE — Progress Notes (Signed)
° °  New Patient   Subjective  George [name] is a 51 y.o. male who presents for the following: Annual Exam (Left neck tag v/s mole raised and smooth per patient ).   The following portions of the chart were reviewed this encounter and updated as appropriate:  Tobacco   Allergies   Meds   Problems   Med Hx   Surg Hx   Fam Hx       Objective  Well appearing patient in no apparent distress; mood and affect are within normal limits.  All skin waist up examined.  Neck - Anterior Polypoid papule        Assessment & Plan  Neoplasm of uncertain behavior of skin Neck - Anterior  Skin / nail biopsy Type of biopsy: tangential   Informed consent: discussed and consent obtained   Timeout: patient name, date of birth, surgical site, and procedure verified   Procedure prep:  Patient was prepped and draped in usual sterile fashion (Non sterile) Prep type:  Chlorhexidine Anesthesia: the lesion was anesthetized in a standard fashion   Anesthetic:  1% lidocaine w/ epinephrine 1-100,000 local infiltration Instrument used: flexible razor blade   Outcome: patient tolerated procedure well   Post-procedure details: wound care instructions given    Specimen 1 - Surgical pathology Differential Diagnosis: bcc vs scc, wart-txpbx  Check Margins: No   No atypical nevi noted at the time of the visit.  I, Julyanna Scholle, PA-C, have reviewed all documentation's for this visit.  The documentation on 10/07/21 for the exam, diagnosis, procedures and orders are all accurate and complete.

## 2021-10-10 DIAGNOSIS — E291 Testicular hypofunction: Secondary | ICD-10-CM | POA: Diagnosis not present

## 2021-10-24 DIAGNOSIS — E291 Testicular hypofunction: Secondary | ICD-10-CM | POA: Diagnosis not present

## 2021-11-07 DIAGNOSIS — E291 Testicular hypofunction: Secondary | ICD-10-CM | POA: Diagnosis not present

## 2021-11-21 DIAGNOSIS — E291 Testicular hypofunction: Secondary | ICD-10-CM | POA: Diagnosis not present

## 2021-11-21 DIAGNOSIS — Z23 Encounter for immunization: Secondary | ICD-10-CM | POA: Diagnosis not present

## 2021-12-05 DIAGNOSIS — E291 Testicular hypofunction: Secondary | ICD-10-CM | POA: Diagnosis not present

## 2021-12-07 DIAGNOSIS — R350 Frequency of micturition: Secondary | ICD-10-CM | POA: Diagnosis not present

## 2021-12-10 DIAGNOSIS — R7989 Other specified abnormal findings of blood chemistry: Secondary | ICD-10-CM | POA: Diagnosis not present

## 2021-12-10 DIAGNOSIS — E291 Testicular hypofunction: Secondary | ICD-10-CM | POA: Diagnosis not present

## 2021-12-10 DIAGNOSIS — R748 Abnormal levels of other serum enzymes: Secondary | ICD-10-CM | POA: Diagnosis not present

## 2021-12-19 ENCOUNTER — Ambulatory Visit: Payer: PPO | Admitting: Neurology

## 2021-12-19 ENCOUNTER — Encounter: Payer: Self-pay | Admitting: Neurology

## 2021-12-19 VITALS — BP 141/82 | HR 83 | Wt 154.5 lb

## 2021-12-19 DIAGNOSIS — R531 Weakness: Secondary | ICD-10-CM

## 2021-12-19 DIAGNOSIS — R269 Unspecified abnormalities of gait and mobility: Secondary | ICD-10-CM | POA: Diagnosis not present

## 2021-12-19 DIAGNOSIS — F319 Bipolar disorder, unspecified: Secondary | ICD-10-CM | POA: Diagnosis not present

## 2021-12-19 DIAGNOSIS — R1310 Dysphagia, unspecified: Secondary | ICD-10-CM | POA: Diagnosis not present

## 2021-12-19 DIAGNOSIS — G35 Multiple sclerosis: Secondary | ICD-10-CM

## 2021-12-19 DIAGNOSIS — E291 Testicular hypofunction: Secondary | ICD-10-CM | POA: Diagnosis not present

## 2021-12-19 DIAGNOSIS — F845 Asperger's syndrome: Secondary | ICD-10-CM | POA: Diagnosis not present

## 2021-12-19 NOTE — Progress Notes (Signed)
? ?[facility] ? ?PATIENT: George [name] ?DOB: 1971-07-18 ? ?REFERRING DOCTOR OR PCP: PCP is Lavone Orn.  Patient has seen Dr. Leta Baptist ?SOURCE: Patient, mother, notes from Dr. Leta Baptist, MRI imaging reports and lab reports.  MRI images personally reviewed ? ?_________________________________ ? ? ?HISTORICAL ? ?CHIEF COMPLAINT:  ?Chief Complaint  ?Patient presents with  ? Follow-up  ?  RM 1, alone. Last seen 06/20/21. MS DMT: glatiramer.   ? ? ?HISTORY OF PRESENT ILLNESS:  ?George [name] is a 51 y.o. man with relapsing remitting multiple sclerosis. ? ?Update  12/19/2021: ?He is on glatiramer and tolerates it well.    No exacerbation  Last MRI 03/15/2021 showed no new lesions.   ? ?Neurologically, gait is mildly unbalanced but improved from a couple months ago.  He stumbles but has no falls over the last few weeks.  Sometimes the right foot will drag when he walks.  He denies any significant numbness.  Vision is fine.  Bladder function is usually good though he has some urgency with use of caffeine.    ? ?He sees speech therapy for swallow function.   The states they feel swallow issues are more psychologic than due to MS.   ? ?He is active and tries to exercise daily.    He has some fatigue but no more than last year.     He is sleeping well most nights.   ? ?He has bipolar disease and is currently on Depakote.   He has mood swings and is quick to get angry.    ?  ?He takes Vit D and B12 supplements.   ? ?MS HISTORY: ?He has a h/o Asperger syndrome and bipolar disease.   In 2012, he had an MRI of the brain and was found to have multiple T2/FLAIR hyperintense foci in the brain.   A lumbar puncture at the time was negative.    Over the last year, he began to have more difficulty with right hand coordination and left foot drop.    Also the right side of his face was drooping some.     He came in to see Dr. Leta Baptist and had repeat imaging studies showing progression of the white matter foci and the  appearance of a small focus at C5.    Repeat LP, however, showed negative CSF (no OCB) again.   He started glatiramer 10/2020.   ? ?OTHER MEDICAL HISTORY ?He has bipolar disease (sees Dr. Toy Care).  He also has autism.   He is on Depakote.   He notes being more emotional and easily gets overwhelmed with emotions.   This had happened when he was younger but then got better again.     ? ?He has irritable bowel syndrome and takes B12 since he was told absorption may be affected.   B12 level was fine.   ? ? ?Imaging studies reviewed: ?MRI of the brain 11/21/2010 shows multiple T2/FLAIR hyperintense foci including a focus in the left pons, middle cerebellar peduncle, right thalamus.  Some foci of periventricular and some foci are juxtacortical.  None of the foci enhanced. ? ?MRI of the brain 05/16/2020 showed a similar pattern at the 2012 MRI but there has been mild progression with at least 2 new lesions noted in the white matter compared to the previous MRI.  As seen before there is a large focus involving the left greater than right pons.  It may be slightly larger.  There is also involvement of the middle cerebellar  peduncle which is stable on the right but a new focus on the left.  There is one stable focus in the right thalamus. ? ?MRI of the cervical spine 08/29/2020 showed a small focus posteriorly to the right adjacent to C5.  Mild DJD at C5-C6 not causing nerve root compression. ? ?MRI of the thoracic spine 08/29/2020 showed a normal spinal cord (subtle focus at T10-T11 more likely artifact).  There are cystic lesions within the kidneys ? ?MRI of the brain 03/15/2021 and it was unchanged.   No new foci. ? ?MRI of the cervical spine 03/15/2021 showed a focus to the right at [facility] ? ?Laboratory results. ?ANA, ANCA, HIV, hep B antibodies, hep C antibody were all negative or normal.  Vitamin D was mildly low.  Vitamin B12 was fine (elevated), creatinine was mildly elevated.  JCV antibody was positive at 1.3.  There were no  oligoclonal bands in the CSF.  Protein was mildly elevated at 54. ? ?REVIEW OF SYSTEMS: ?Constitutional: No fevers, chills, sweats, or change in appetite ?Eyes: No visual changes, double vision, eye pain ?Ear, nose and throat: No hearing loss, ear pain, nasal congestion, sore throat ?Cardiovascular: No chest pain, palpitations ?Respiratory:  No shortness of breath at rest or with exertion.   No wheezes ?GastrointestinaI: No nausea, vomiting, diarrhea, abdominal pain, fecal incontinence ?Genitourinary:  No dysuria, urinary retention or frequency.  No nocturia. ?Musculoskeletal:  No neck pain, back pain ?Integumentary: No rash, pruritus, skin lesions ?Neurological: as above ?Psychiatric: No depression at this time.  No anxiety ?Endocrine: No palpitations, diaphoresis, change in appetite, change in weigh or increased thirst ?Hematologic/Lymphatic:  No anemia, purpura, petechiae. ?Allergic/Immunologic: No itchy/runny eyes, nasal congestion, recent allergic reactions, rashes ? ?ALLERGIES: ?Allergies  ?Allergen Reactions  ? Other Other (See Comments)  ?  Can only eat at home  ? ? ?HOME MEDICATIONS: ? ?Current Outpatient Medications:  ?  amLODipine-valsartan (EXFORGE) 5-160 MG tablet, Take 1 tablet by mouth in the morning., Disp: , Rfl:  ?  busPIRone (BUSPAR) 10 MG tablet, Take 20 mg by mouth 2 (two) times daily., Disp: , Rfl:  ?  Cholecalciferol (VITAMIN D3) 50 MCG (2000 UT) TABS, Take 2,000 Units by mouth in the morning., Disp: , Rfl:  ?  COPAXONE 40 MG/ML SOSY, Inject '40mg'$  subcutaneously three (3) times per week, Disp: 11.76 mL, Rfl: 3 ?  Cyanocobalamin (VITAMIN B-12) 5000 MCG TBDP, Place 5,000 Units under the tongue 2 (two) times a week., Disp: , Rfl:  ?  divalproex (DEPAKOTE) 250 MG DR tablet, Take 1,250 mg by mouth daily. , Disp: , Rfl:  ?  Multiple Vitamin (MULTIVITAMIN WITH MINERALS) TABS tablet, Take 1 tablet by mouth in the morning., Disp: , Rfl:  ?  SYNTHROID 100 MCG tablet, Take 100 mcg by mouth daily before  breakfast. , Disp: , Rfl:  ?  testosterone enanthate (DELATESTRYL) 200 MG/ML injection, Inject 150 mg into the muscle every 14 (fourteen) days. For IM use only  Every three weeks, Disp: , Rfl:  ? ?PAST MEDICAL HISTORY: ?Past Medical History:  ?Diagnosis Date  ? Anxiety   ? Asperger's syndrome   ? Autistic spectrum disorder   ? Asperger's (per H&P [facility], [facility])  ? Bipolar 1 disorder ([place])   ? "no mood swings in 20 years"  on medication  ? GERD (gastroesophageal reflux disease)   ? Head injury 2020  ? Hypertension   ? Hypothyroidism   ? IBS (irritable bowel syndrome)   ? ? ?PAST SURGICAL  HISTORY: ?Past Surgical History:  ?Procedure Laterality Date  ? NO PAST SURGERIES    ? RADIOLOGY WITH ANESTHESIA N/A 05/16/2020  ? Procedure: MRI BRAIN WITH AND WITHOUT CONTRAST;  Surgeon: Radiologist, Medication, MD;  Location: [facility];  Service: Radiology;  Laterality: N/A;  ? RADIOLOGY WITH ANESTHESIA N/A 08/29/2020  ? Procedure: MRI WITH ANESTHESIA CERVICAL SPINE WITH AND WITHOUT CONTRAST, THORACIC SPINE WITH AND WITHOUT CONTRAST;  Surgeon: Radiologist, Medication, MD;  Location: [facility];  Service: Radiology;  Laterality: N/A;  ? RADIOLOGY WITH ANESTHESIA N/A 03/15/2021  ? Procedure: MRI BRAIN WITH AND WITHOUT, CERIVAL WITH AND WITHOUT;  Surgeon: Radiologist, Medication, MD;  Location: [facility];  Service: Radiology;  Laterality: N/A;  ? ? ?FAMILY HISTORY: ?Family History  ?Problem Relation Age of Onset  ? Hypertension Father   ? Depression Sister   ? Hypertension Paternal Grandfather   ? ? ?SOCIAL HISTORY: ? ?Social History  ? ?Socioeconomic History  ? Marital status: Single  ?  Spouse name: Not on file  ? Number of children: 0  ? Years of education: college  ? Highest education level: Not on file  ?Occupational History  ?  Comment: artist  ?Tobacco Use  ? Smoking status: Never  ? Smokeless tobacco: Never  ?Vaping Use  ? Vaping Use: Never used  ?Substance and Sexual Activity  ? Alcohol use: No  ? Drug use: No  ? Sexual activity: Not  on file  ?Other Topics Concern  ? Not on file  ?Social History Narrative  ? He lives at home.   ? He is single and has no children.    ? He denies use of tobacco, alcohol, illicit drugs and caffeine.

## 2021-12-26 DIAGNOSIS — E291 Testicular hypofunction: Secondary | ICD-10-CM | POA: Diagnosis not present

## 2022-01-02 DIAGNOSIS — E291 Testicular hypofunction: Secondary | ICD-10-CM | POA: Diagnosis not present

## 2022-01-16 DIAGNOSIS — E291 Testicular hypofunction: Secondary | ICD-10-CM | POA: Diagnosis not present

## 2022-01-17 ENCOUNTER — Other Ambulatory Visit: Payer: Self-pay | Admitting: Neurology

## 2022-01-30 DIAGNOSIS — E291 Testicular hypofunction: Secondary | ICD-10-CM | POA: Diagnosis not present

## 2022-02-12 DIAGNOSIS — Z1211 Encounter for screening for malignant neoplasm of colon: Secondary | ICD-10-CM | POA: Diagnosis not present

## 2022-02-12 DIAGNOSIS — D125 Benign neoplasm of sigmoid colon: Secondary | ICD-10-CM | POA: Diagnosis not present

## 2022-02-12 DIAGNOSIS — D123 Benign neoplasm of transverse colon: Secondary | ICD-10-CM | POA: Diagnosis not present

## 2022-02-14 ENCOUNTER — Telehealth: Payer: Self-pay | Admitting: Neurology

## 2022-02-14 DIAGNOSIS — D125 Benign neoplasm of sigmoid colon: Secondary | ICD-10-CM | POA: Diagnosis not present

## 2022-02-14 DIAGNOSIS — E291 Testicular hypofunction: Secondary | ICD-10-CM | POA: Diagnosis not present

## 2022-02-14 DIAGNOSIS — D123 Benign neoplasm of transverse colon: Secondary | ICD-10-CM | POA: Diagnosis not present

## 2022-02-14 NOTE — Telephone Encounter (Signed)
LVM for pt explaining the difference between the two. Advised him to call back if he has any further questions.

## 2022-02-14 NOTE — Telephone Encounter (Signed)
Pt asking what is the different between MS and Guillain Barre Syndrome. Would like a call from the nurse.

## 2022-02-19 NOTE — Telephone Encounter (Signed)
Called and spoke w/ pt about the differences between MS/Gillian Barre syndrome. Pt verbalized understanding and appreciation.

## 2022-02-19 NOTE — Telephone Encounter (Signed)
Pt called stating that he needed to update his phone number. I have updated his phone number in chart but Pt also states that when the nurse called him with the answer to his question he was not able to get the message due to the change of phone number. Please call pt back with answer to his previous question.

## 2022-02-28 DIAGNOSIS — E291 Testicular hypofunction: Secondary | ICD-10-CM | POA: Diagnosis not present

## 2022-03-14 DIAGNOSIS — E291 Testicular hypofunction: Secondary | ICD-10-CM | POA: Diagnosis not present

## 2022-03-20 DIAGNOSIS — F319 Bipolar disorder, unspecified: Secondary | ICD-10-CM | POA: Diagnosis not present

## 2022-03-28 DIAGNOSIS — E291 Testicular hypofunction: Secondary | ICD-10-CM | POA: Diagnosis not present

## 2022-04-03 DIAGNOSIS — F902 Attention-deficit hyperactivity disorder, combined type: Secondary | ICD-10-CM | POA: Diagnosis not present

## 2022-04-03 DIAGNOSIS — F840 Autistic disorder: Secondary | ICD-10-CM | POA: Diagnosis not present

## 2022-04-03 DIAGNOSIS — F319 Bipolar disorder, unspecified: Secondary | ICD-10-CM | POA: Diagnosis not present

## 2022-04-10 DIAGNOSIS — F319 Bipolar disorder, unspecified: Secondary | ICD-10-CM | POA: Diagnosis not present

## 2022-04-10 DIAGNOSIS — F902 Attention-deficit hyperactivity disorder, combined type: Secondary | ICD-10-CM | POA: Diagnosis not present

## 2022-04-10 DIAGNOSIS — F840 Autistic disorder: Secondary | ICD-10-CM | POA: Diagnosis not present

## 2022-04-11 DIAGNOSIS — E291 Testicular hypofunction: Secondary | ICD-10-CM | POA: Diagnosis not present

## 2022-04-11 DIAGNOSIS — E039 Hypothyroidism, unspecified: Secondary | ICD-10-CM | POA: Diagnosis not present

## 2022-04-17 DIAGNOSIS — F840 Autistic disorder: Secondary | ICD-10-CM | POA: Diagnosis not present

## 2022-04-17 DIAGNOSIS — F902 Attention-deficit hyperactivity disorder, combined type: Secondary | ICD-10-CM | POA: Diagnosis not present

## 2022-04-17 DIAGNOSIS — F319 Bipolar disorder, unspecified: Secondary | ICD-10-CM | POA: Diagnosis not present

## 2022-04-18 DIAGNOSIS — Z77098 Contact with and (suspected) exposure to other hazardous, chiefly nonmedicinal, chemicals: Secondary | ICD-10-CM | POA: Diagnosis not present

## 2022-04-24 DIAGNOSIS — F840 Autistic disorder: Secondary | ICD-10-CM | POA: Diagnosis not present

## 2022-04-24 DIAGNOSIS — F902 Attention-deficit hyperactivity disorder, combined type: Secondary | ICD-10-CM | POA: Diagnosis not present

## 2022-04-24 DIAGNOSIS — F319 Bipolar disorder, unspecified: Secondary | ICD-10-CM | POA: Diagnosis not present

## 2022-04-25 DIAGNOSIS — E291 Testicular hypofunction: Secondary | ICD-10-CM | POA: Diagnosis not present

## 2022-05-01 DIAGNOSIS — F840 Autistic disorder: Secondary | ICD-10-CM | POA: Diagnosis not present

## 2022-05-01 DIAGNOSIS — F319 Bipolar disorder, unspecified: Secondary | ICD-10-CM | POA: Diagnosis not present

## 2022-05-01 DIAGNOSIS — F902 Attention-deficit hyperactivity disorder, combined type: Secondary | ICD-10-CM | POA: Diagnosis not present

## 2022-05-02 DIAGNOSIS — M79672 Pain in left foot: Secondary | ICD-10-CM | POA: Diagnosis not present

## 2022-05-08 DIAGNOSIS — F902 Attention-deficit hyperactivity disorder, combined type: Secondary | ICD-10-CM | POA: Diagnosis not present

## 2022-05-08 DIAGNOSIS — F840 Autistic disorder: Secondary | ICD-10-CM | POA: Diagnosis not present

## 2022-05-08 DIAGNOSIS — F319 Bipolar disorder, unspecified: Secondary | ICD-10-CM | POA: Diagnosis not present

## 2022-05-09 DIAGNOSIS — E291 Testicular hypofunction: Secondary | ICD-10-CM | POA: Diagnosis not present

## 2022-05-15 DIAGNOSIS — F319 Bipolar disorder, unspecified: Secondary | ICD-10-CM | POA: Diagnosis not present

## 2022-05-15 DIAGNOSIS — F902 Attention-deficit hyperactivity disorder, combined type: Secondary | ICD-10-CM | POA: Diagnosis not present

## 2022-05-15 DIAGNOSIS — F840 Autistic disorder: Secondary | ICD-10-CM | POA: Diagnosis not present

## 2022-05-22 DIAGNOSIS — F319 Bipolar disorder, unspecified: Secondary | ICD-10-CM | POA: Diagnosis not present

## 2022-05-22 DIAGNOSIS — F902 Attention-deficit hyperactivity disorder, combined type: Secondary | ICD-10-CM | POA: Diagnosis not present

## 2022-05-22 DIAGNOSIS — F840 Autistic disorder: Secondary | ICD-10-CM | POA: Diagnosis not present

## 2022-05-23 DIAGNOSIS — E291 Testicular hypofunction: Secondary | ICD-10-CM | POA: Diagnosis not present

## 2022-05-29 DIAGNOSIS — F902 Attention-deficit hyperactivity disorder, combined type: Secondary | ICD-10-CM | POA: Diagnosis not present

## 2022-05-29 DIAGNOSIS — F319 Bipolar disorder, unspecified: Secondary | ICD-10-CM | POA: Diagnosis not present

## 2022-05-29 DIAGNOSIS — F840 Autistic disorder: Secondary | ICD-10-CM | POA: Diagnosis not present

## 2022-06-05 DIAGNOSIS — F840 Autistic disorder: Secondary | ICD-10-CM | POA: Diagnosis not present

## 2022-06-05 DIAGNOSIS — F902 Attention-deficit hyperactivity disorder, combined type: Secondary | ICD-10-CM | POA: Diagnosis not present

## 2022-06-05 DIAGNOSIS — F319 Bipolar disorder, unspecified: Secondary | ICD-10-CM | POA: Diagnosis not present

## 2022-06-06 DIAGNOSIS — E291 Testicular hypofunction: Secondary | ICD-10-CM | POA: Diagnosis not present

## 2022-06-06 NOTE — Progress Notes (Signed)
[facility]  PATIENT: George [name] DOB: 03/10/1971  REFERRING DOCTOR OR PCP: PCP is Lavone Orn.  Patient has seen Dr. Leta Baptist SOURCE: Patient, mother, notes from Dr. Leta Baptist, MRI imaging reports and lab reports.  MRI images personally reviewed  _________________________________   HISTORICAL  CHIEF COMPLAINT:  Chief Complaint  Patient presents with   Follow-up Multiple Sclerosis    Pt reports feeling fine. He states he has had some swelling issues. He states his legs sometimes give out on him when walking downstairs. He states having his good and bad days. Room 2 alone    HISTORY OF PRESENT ILLNESS:  Mr. George [name] is a 51 y.o. man with relapsing remitting multiple sclerosis.  Update 06/10/2022: Previously seen by Dr. Felecia Shelling 12/19/2021. He is on glatiramer and tolerates it well.    No exacerbation  Last MRI 03/15/2021 showed no new lesions.    Neurologically, feels his gait has possibly declined very slightly since last visit. He did have a fall about a mouth ago, his right leg gave out on him causing injury to left leg, reports having xray completed which was negative for fracture, believes he pulled a muscle.  He continues to have more weakness on right arm and leg, believes this has been stable since prior visit, does work out daily to help maintain current level. He questions use of TENS unit to help with occasional RLE cramping which is not new. He denies any significant numbness.  Vision is fine.  Bladder function is usually good though he has some urgency with use of caffeine.     He sees speech therapy for swallow function.   The states they feel swallow issues are more psychologic than due to MS.    He is active and tries to exercise daily.  He has some fatigue but no more than last year. He is sleeping well most nights.    He has bipolar disease and is currently on Depakote.   He has mood swings and is quick to get angry.      He takes Vit D and B12  supplements.    MS HISTORY: He has a h/o Asperger syndrome and bipolar disease.   In 2012, he had an MRI of the brain and was found to have multiple T2/FLAIR hyperintense foci in the brain.   A lumbar puncture at the time was negative.    Over the last year, he began to have more difficulty with right hand coordination and left foot drop.    Also the right side of his face was drooping some.     He came in to see Dr. Leta Baptist and had repeat imaging studies showing progression of the white matter foci and the appearance of a small focus at C5.    Repeat LP, however, showed negative CSF (no OCB) again.   He started glatiramer 10/2020.    OTHER MEDICAL HISTORY He has bipolar disease (sees Dr. Toy Care).  He also has autism.   He is on Depakote.   He notes being more emotional and easily gets overwhelmed with emotions.   This had happened when he was younger but then got better again.      He has irritable bowel syndrome and takes B12 since he was told absorption may be affected.   B12 level was fine.     Imaging studies reviewed: MRI of the brain 11/21/2010 shows multiple T2/FLAIR hyperintense foci including a focus in the left pons, middle cerebellar peduncle, right thalamus.  Some foci of periventricular and some foci are juxtacortical.  None of the foci enhanced.  MRI of the brain 05/16/2020 showed a similar pattern at the 2012 MRI but there has been mild progression with at least 2 new lesions noted in the white matter compared to the previous MRI.  As seen before there is a large focus involving the left greater than right pons.  It may be slightly larger.  There is also involvement of the middle cerebellar peduncle which is stable on the right but a new focus on the left.  There is one stable focus in the right thalamus.  MRI of the cervical spine 08/29/2020 showed a small focus posteriorly to the right adjacent to C5.  Mild DJD at C5-C6 not causing nerve root compression.  MRI of the thoracic spine  08/29/2020 showed a normal spinal cord (subtle focus at T10-T11 more likely artifact).  There are cystic lesions within the kidneys  MRI of the brain 03/15/2021 and it was unchanged.   No new foci.  MRI of the cervical spine 03/15/2021 showed a focus to the right at [facility].  Laboratory results. ANA, ANCA, HIV, hep B antibodies, hep C antibody were all negative or normal.  Vitamin D was mildly low.  Vitamin B12 was fine (elevated), creatinine was mildly elevated.  JCV antibody was positive at 1.3.  There were no oligoclonal bands in the CSF.  Protein was mildly elevated at 54.  REVIEW OF SYSTEMS: Constitutional: No fevers, chills, sweats, or change in appetite Eyes: No visual changes, double vision, eye pain Ear, nose and throat: No hearing loss, ear pain, nasal congestion, sore throat Cardiovascular: No chest pain, palpitations Respiratory:  No shortness of breath at rest or with exertion.   No wheezes GastrointestinaI: No nausea, vomiting, diarrhea, abdominal pain, fecal incontinence Genitourinary:  No dysuria, urinary retention or frequency.  No nocturia. Musculoskeletal:  No neck pain, back pain Integumentary: No rash, pruritus, skin lesions Neurological: as above Psychiatric: No depression at this time.  No anxiety Endocrine: No palpitations, diaphoresis, change in appetite, change in weigh or increased thirst Hematologic/Lymphatic:  No anemia, purpura, petechiae. Allergic/Immunologic: No itchy/runny eyes, nasal congestion, recent allergic reactions, rashes  ALLERGIES: Allergies  Allergen Reactions   Other Other (See Comments)    Can only eat at home    HOME MEDICATIONS:  Current Outpatient Medications:    amLODipine-valsartan (EXFORGE) 5-160 MG tablet, Take 1 tablet by mouth in the morning., Disp: , Rfl:    busPIRone (BUSPAR) 10 MG tablet, Take 20 mg by mouth 2 (two) times daily., Disp: , Rfl:    Cholecalciferol (VITAMIN D3) 50 MCG (2000 UT) TABS, Take 2,000 Units by mouth in the  morning., Disp: , Rfl:    COPAXONE 40 MG/ML SOSY, Inject '40mg'$  subcutaneously three (3) times per week, Disp: 11.76 mL, Rfl: 11   Cyanocobalamin (VITAMIN B-12) 5000 MCG TBDP, Place 5,000 Units under the tongue 2 (two) times a week., Disp: , Rfl:    divalproex (DEPAKOTE) 250 MG DR tablet, Take 1,250 mg by mouth daily. , Disp: , Rfl:    Multiple Vitamin (MULTIVITAMIN WITH MINERALS) TABS tablet, Take 1 tablet by mouth in the morning., Disp: , Rfl:    SYNTHROID 100 MCG tablet, Take 100 mcg by mouth daily before breakfast. , Disp: , Rfl:    testosterone enanthate (DELATESTRYL) 200 MG/ML injection, Inject 150 mg into the muscle every 14 (fourteen) days. For IM use only  Every three weeks, Disp: , Rfl:  PAST MEDICAL HISTORY: Past Medical History:  Diagnosis Date   Anxiety    Asperger's syndrome    Autistic spectrum disorder    Asperger's (per H&P Olivia Clelland, PA)   Bipolar 1 disorder ([place])    "no mood swings in 20 years"  on medication   GERD (gastroesophageal reflux disease)    Head injury 2020   Hypertension    Hypothyroidism    IBS (irritable bowel syndrome)     PAST SURGICAL HISTORY: Past Surgical History:  Procedure Laterality Date   NO PAST SURGERIES     RADIOLOGY WITH ANESTHESIA N/A 05/16/2020   Procedure: MRI BRAIN WITH AND WITHOUT CONTRAST;  Surgeon: Radiologist, Medication, MD;  Location: [place];  Service: Radiology;  Laterality: N/A;   RADIOLOGY WITH ANESTHESIA N/A 08/29/2020   Procedure: MRI WITH ANESTHESIA CERVICAL SPINE WITH AND WITHOUT CONTRAST, THORACIC SPINE WITH AND WITHOUT CONTRAST;  Surgeon: Radiologist, Medication, MD;  Location: [place];  Service: Radiology;  Laterality: N/A;   RADIOLOGY WITH ANESTHESIA N/A 03/15/2021   Procedure: MRI BRAIN WITH AND WITHOUT, CERIVAL WITH AND WITHOUT;  Surgeon: Radiologist, Medication, MD;  Location: [place];  Service: Radiology;  Laterality: N/A;    FAMILY HISTORY: Family History  Problem Relation Age of Onset   Hypertension Father     Depression Sister    Hypertension Paternal Grandfather     SOCIAL HISTORY:  Social History   Socioeconomic History   Marital status: Single    Spouse name: Not on file   Number of children: 0   Years of education: college   Highest education level: Not on file  Occupational History    Comment: artist  Tobacco Use   Smoking status: Never   Smokeless tobacco: Never  Vaping Use   Vaping Use: Never used  Substance and Sexual Activity   Alcohol use: No   Drug use: No   Sexual activity: Not on file  Other Topics Concern   Not on file  Social History Narrative   He lives at home.    He is single and has no children.     He denies use of tobacco, alcohol, illicit drugs and caffeine.    He works as an Training and development officer.    Oncologist: Not on [employer] Insecurity: Not on file  Transportation Needs: Not on file  Physical Activity: Not on file  Stress: Not on file  Social Connections: Not on file  Intimate Partner Violence: Not on file     PHYSICAL EXAM  Vitals:   06/10/22 1348  BP: (!) 132/91  Pulse: 81  Weight: 161 lb (73 kg)  Height: '5\' 7"'$  (1.702 m)     Body mass index is 25.22 kg/m.   General: The patient is well-developed and well-nourished and in no acute distress  HEENT:  Head is [place]/AT.  Sclera are anicteric.   Neck:  The neck is nontender.  Skin: Extremities are without rash or  edema.   Neurologic Exam  Mental status: The patient is alert and oriented x 3 at the time of the examination. The patient has apparent normal recent and remote memory, with an apparently normal attention span and concentration ability.   Speech is normal.  Cranial nerves: Extraocular movements are full.   He has mild weakness in the right lower face (normal strength in forehead).   Facial strength and sensation are normal.     Trapezius and sternocleidomastoid strength is normal. No dysarthria is  noted.    No obvious hearing deficits  are noted.  Motor:  Muscle bulk is normal.   Tone is normal. Strength is  5 / 5 in left side, 5-/5 right arm (mildly reduced RAM)  4+/5 right  leg.   Mild reduced RAM on the right arm and leg  Sensory: Sensory testing is intact to pinprick, soft touch and vibration sensation in arms and slightly reduced vibraiton in right leg.  Coordination: Cerebellar testing reveals mildly reduced coordination right hand and foot.   Reduced heel-to-shin.  Gait and station: Station is normal.   He has a right foot drop.  The tandem gait is wide.   Romberg is negative.   Reflexes: Deep tendon reflexes are symmetric and normal in arms and increased right leg.        DIAGNOSTIC DATA (LABS, IMAGING, TESTING) - I reviewed patient records, labs, notes, testing and imaging myself where available.  Lab Results  Component Value Date   WBC 7.1 09/11/2020   HGB 17.4 09/11/2020   HCT 52.4 (H) 09/11/2020   MCV 85 09/11/2020   PLT 206 09/11/2020      Component Value Date/Time   NA 138 03/15/2021 0934   NA 142 09/11/2020 1035   K 4.2 03/15/2021 0934   CL 104 03/15/2021 0934   CO2 26 03/15/2021 0934   GLUCOSE 87 03/15/2021 0934   BUN 14 03/15/2021 0934   BUN 16 09/11/2020 1035   CREATININE 1.22 03/15/2021 0934   CREATININE 1.14 03/04/2014 1310   CALCIUM 9.2 03/15/2021 0934   PROT 7.3 09/11/2020 1035   ALBUMIN 4.6 09/11/2020 1035   AST 16 09/11/2020 1035   ALT 20 09/11/2020 1035   ALKPHOS 67 09/11/2020 1035   BILITOT 0.3 09/11/2020 1035   GFRNONAA >60 03/15/2021 0934   GFRAA 72 09/11/2020 1035   No results found for: "CHOL", "HDL", "LDLCALC", "LDLDIRECT", "TRIG", "CHOLHDL" Lab Results  Component Value Date   HGBA1C 5.4 09/11/2020   Lab Results  Component Value Date   VITAMINB12 >2000 (H) 09/11/2020   Lab Results  Component Value Date   TSH 1.400 09/11/2020       ASSESSMENT AND PLAN  Multiple sclerosis ([place]) - Plan: MR CERVICAL SPINE W WO CONTRAST, MR BRAIN W WO CONTRAST  Gait  disturbance  Right sided weakness   Continue glatiramer.   Repeat MRI brain and cervical spine to assess for any subclinical progression.  He questions need of lumbar and thoracic imaging, will run this by Dr. Felecia Shelling prior to ordering 2.   Stay active and exercie as tolerated. 3.   Continue vit D supplements 4.   Return to see [place] in 6 months or sooner for new or worsening neurologic symptoms.    I spent 26 minutes of face-to-face and non-face-to-face time with patient.  This included previsit chart review, lab review, study review, order entry, electronic health record documentation, patient education and discussion regarding above diagnoses and treatment plan and answered all the questions to patient's satisfaction   Frann Rider, [facility]  [facility] [address] [place], [place] [address]  Phone [phone] Fax [phone] Note: This document was prepared with digital dictation and possible smart phrase technology. Any transcriptional errors that result from this process are unintentional.

## 2022-06-10 ENCOUNTER — Encounter: Payer: Self-pay | Admitting: Adult Health

## 2022-06-10 ENCOUNTER — Ambulatory Visit: Payer: PPO | Admitting: Adult Health

## 2022-06-10 VITALS — BP 132/91 | HR 81 | Ht 67.0 in | Wt 161.0 lb

## 2022-06-10 DIAGNOSIS — R269 Unspecified abnormalities of gait and mobility: Secondary | ICD-10-CM

## 2022-06-10 DIAGNOSIS — R531 Weakness: Secondary | ICD-10-CM | POA: Diagnosis not present

## 2022-06-10 DIAGNOSIS — G35 Multiple sclerosis: Secondary | ICD-10-CM

## 2022-06-10 NOTE — Patient Instructions (Addendum)
Your Plan:  Continue Copaxone injection  You will be called to schedule repeat imaging to look for any progression of MS    Follow up in 6 months or call earlier if needed     Thank you for coming to see [place] at [facility]. I hope we have been able to provide you high quality care today.  You may receive a patient satisfaction survey over the next few weeks. We would appreciate your feedback and comments so that we may continue to improve ourselves and the health of our patients.

## 2022-06-11 ENCOUNTER — Telehealth: Payer: Self-pay | Admitting: Adult Health

## 2022-06-11 NOTE — Telephone Encounter (Signed)
[employer] NPR sent to [facility] [phone] 

## 2022-06-12 ENCOUNTER — Telehealth: Payer: Self-pay | Admitting: Neurology

## 2022-06-12 DIAGNOSIS — F319 Bipolar disorder, unspecified: Secondary | ICD-10-CM | POA: Diagnosis not present

## 2022-06-12 DIAGNOSIS — F902 Attention-deficit hyperactivity disorder, combined type: Secondary | ICD-10-CM | POA: Diagnosis not present

## 2022-06-12 DIAGNOSIS — F840 Autistic disorder: Secondary | ICD-10-CM | POA: Diagnosis not present

## 2022-06-12 NOTE — Telephone Encounter (Signed)
Open MRI was not on the orders, I will fax them to [facility]. [phone]

## 2022-06-12 NOTE — Telephone Encounter (Signed)
Pt is calling. Stated he requested an open MRI but his order was sent to [facility] and they don't have one. Pt is requesting open MRI and a call-back.

## 2022-06-12 NOTE — Telephone Encounter (Signed)
Called the patient and advised that Janett Billow spoke with Dr Felecia Shelling and he didn't feel that the patient needed the thoracic or lumbar imaging. Advised that should move forward with the MRI brain and cervical. Pt questioned why he didn't think that was necessary given history of where it has presented in the past.I advised that as long as there are no symptoms that are correlating there is something r/t to that area he typically only orders MRI of brain yearly to monitor. He is ordering the cervical spine this time too.  Pt advised that he was going to use a TENS unit to see if that would help with the muscles. He verbalized understanding of my call and had no other questions.

## 2022-06-12 NOTE — Telephone Encounter (Signed)
-----   Message from Frann Rider, NP sent at 06/12/2022  9:17 AM EST ----- Please advise patient that Dr. Felecia Shelling does not believe thoracic or lumbar imaging warranted, only recommends MRI brain and cervical spine. Orders placed at recent visit for these testings. Thank you.

## 2022-06-19 DIAGNOSIS — F319 Bipolar disorder, unspecified: Secondary | ICD-10-CM | POA: Diagnosis not present

## 2022-06-19 DIAGNOSIS — F840 Autistic disorder: Secondary | ICD-10-CM | POA: Diagnosis not present

## 2022-06-19 DIAGNOSIS — F902 Attention-deficit hyperactivity disorder, combined type: Secondary | ICD-10-CM | POA: Diagnosis not present

## 2022-06-20 DIAGNOSIS — E291 Testicular hypofunction: Secondary | ICD-10-CM | POA: Diagnosis not present

## 2022-06-26 ENCOUNTER — Ambulatory Visit: Payer: PPO | Admitting: Adult Health

## 2022-06-26 DIAGNOSIS — F840 Autistic disorder: Secondary | ICD-10-CM | POA: Diagnosis not present

## 2022-06-26 DIAGNOSIS — F902 Attention-deficit hyperactivity disorder, combined type: Secondary | ICD-10-CM | POA: Diagnosis not present

## 2022-06-26 DIAGNOSIS — F319 Bipolar disorder, unspecified: Secondary | ICD-10-CM | POA: Diagnosis not present

## 2022-07-01 DIAGNOSIS — G35 Multiple sclerosis: Secondary | ICD-10-CM | POA: Diagnosis not present

## 2022-07-03 DIAGNOSIS — F840 Autistic disorder: Secondary | ICD-10-CM | POA: Diagnosis not present

## 2022-07-03 DIAGNOSIS — F319 Bipolar disorder, unspecified: Secondary | ICD-10-CM | POA: Diagnosis not present

## 2022-07-03 DIAGNOSIS — F902 Attention-deficit hyperactivity disorder, combined type: Secondary | ICD-10-CM | POA: Diagnosis not present

## 2022-07-04 DIAGNOSIS — E291 Testicular hypofunction: Secondary | ICD-10-CM | POA: Diagnosis not present

## 2022-07-04 NOTE — Telephone Encounter (Signed)
MRI head and Cervical spine from [facility] placed on NP desk for review.

## 2022-07-04 NOTE — Telephone Encounter (Signed)
Could you review MRI brain and cervical? Brain MRI reports multiple white matter lesions but no prior studies available for comparison to assess for stability/progression of disease.  Can be found in care everywhere. Was completed at [facility]. Thank you.

## 2022-07-08 NOTE — Telephone Encounter (Signed)
Please advise patient re: MRI brain and cervical results  After imaging reviewed by Dr. Felecia Shelling, no new lesions seen on either imaging Continue current treatment plan. Thank you.

## 2022-07-08 NOTE — Telephone Encounter (Signed)
Called the patient back and advised the pt that  Dr Felecia Shelling reviewed the imaging of the brain and cervical spine and did not note any indications of new lesions. Pt verbalized understanding. Pt had no questions at this time but was encouraged to call back if questions arise.

## 2022-07-10 DIAGNOSIS — F902 Attention-deficit hyperactivity disorder, combined type: Secondary | ICD-10-CM | POA: Diagnosis not present

## 2022-07-10 DIAGNOSIS — F319 Bipolar disorder, unspecified: Secondary | ICD-10-CM | POA: Diagnosis not present

## 2022-07-10 DIAGNOSIS — F840 Autistic disorder: Secondary | ICD-10-CM | POA: Diagnosis not present

## 2022-07-17 DIAGNOSIS — F902 Attention-deficit hyperactivity disorder, combined type: Secondary | ICD-10-CM | POA: Diagnosis not present

## 2022-07-17 DIAGNOSIS — F840 Autistic disorder: Secondary | ICD-10-CM | POA: Diagnosis not present

## 2022-07-17 DIAGNOSIS — F319 Bipolar disorder, unspecified: Secondary | ICD-10-CM | POA: Diagnosis not present

## 2022-07-18 DIAGNOSIS — E291 Testicular hypofunction: Secondary | ICD-10-CM | POA: Diagnosis not present

## 2022-07-24 DIAGNOSIS — F319 Bipolar disorder, unspecified: Secondary | ICD-10-CM | POA: Diagnosis not present

## 2022-07-24 DIAGNOSIS — F840 Autistic disorder: Secondary | ICD-10-CM | POA: Diagnosis not present

## 2022-07-24 DIAGNOSIS — F902 Attention-deficit hyperactivity disorder, combined type: Secondary | ICD-10-CM | POA: Diagnosis not present

## 2022-07-31 DIAGNOSIS — F840 Autistic disorder: Secondary | ICD-10-CM | POA: Diagnosis not present

## 2022-07-31 DIAGNOSIS — F319 Bipolar disorder, unspecified: Secondary | ICD-10-CM | POA: Diagnosis not present

## 2022-07-31 DIAGNOSIS — F902 Attention-deficit hyperactivity disorder, combined type: Secondary | ICD-10-CM | POA: Diagnosis not present

## 2022-08-01 DIAGNOSIS — E291 Testicular hypofunction: Secondary | ICD-10-CM | POA: Diagnosis not present

## 2022-08-07 DIAGNOSIS — F319 Bipolar disorder, unspecified: Secondary | ICD-10-CM | POA: Diagnosis not present

## 2022-08-07 DIAGNOSIS — F840 Autistic disorder: Secondary | ICD-10-CM | POA: Diagnosis not present

## 2022-08-07 DIAGNOSIS — F902 Attention-deficit hyperactivity disorder, combined type: Secondary | ICD-10-CM | POA: Diagnosis not present

## 2022-08-14 DIAGNOSIS — F902 Attention-deficit hyperactivity disorder, combined type: Secondary | ICD-10-CM | POA: Diagnosis not present

## 2022-08-14 DIAGNOSIS — F319 Bipolar disorder, unspecified: Secondary | ICD-10-CM | POA: Diagnosis not present

## 2022-08-14 DIAGNOSIS — F840 Autistic disorder: Secondary | ICD-10-CM | POA: Diagnosis not present

## 2022-08-15 DIAGNOSIS — E291 Testicular hypofunction: Secondary | ICD-10-CM | POA: Diagnosis not present

## 2022-08-21 DIAGNOSIS — F319 Bipolar disorder, unspecified: Secondary | ICD-10-CM | POA: Diagnosis not present

## 2022-08-21 DIAGNOSIS — F840 Autistic disorder: Secondary | ICD-10-CM | POA: Diagnosis not present

## 2022-08-21 DIAGNOSIS — F902 Attention-deficit hyperactivity disorder, combined type: Secondary | ICD-10-CM | POA: Diagnosis not present

## 2022-08-28 DIAGNOSIS — F840 Autistic disorder: Secondary | ICD-10-CM | POA: Diagnosis not present

## 2022-08-28 DIAGNOSIS — F319 Bipolar disorder, unspecified: Secondary | ICD-10-CM | POA: Diagnosis not present

## 2022-08-28 DIAGNOSIS — F902 Attention-deficit hyperactivity disorder, combined type: Secondary | ICD-10-CM | POA: Diagnosis not present

## 2022-08-29 DIAGNOSIS — E291 Testicular hypofunction: Secondary | ICD-10-CM | POA: Diagnosis not present

## 2022-08-30 DIAGNOSIS — M79601 Pain in right arm: Secondary | ICD-10-CM | POA: Diagnosis not present

## 2022-09-04 ENCOUNTER — Other Ambulatory Visit (HOSPITAL_COMMUNITY): Payer: Self-pay

## 2022-09-04 DIAGNOSIS — F840 Autistic disorder: Secondary | ICD-10-CM | POA: Diagnosis not present

## 2022-09-04 DIAGNOSIS — F902 Attention-deficit hyperactivity disorder, combined type: Secondary | ICD-10-CM | POA: Diagnosis not present

## 2022-09-04 DIAGNOSIS — F319 Bipolar disorder, unspecified: Secondary | ICD-10-CM | POA: Diagnosis not present

## 2022-09-09 DIAGNOSIS — I10 Essential (primary) hypertension: Secondary | ICD-10-CM | POA: Diagnosis not present

## 2022-09-09 DIAGNOSIS — F845 Asperger's syndrome: Secondary | ICD-10-CM | POA: Diagnosis not present

## 2022-09-09 DIAGNOSIS — G35 Multiple sclerosis: Secondary | ICD-10-CM | POA: Diagnosis not present

## 2022-09-09 DIAGNOSIS — F319 Bipolar disorder, unspecified: Secondary | ICD-10-CM | POA: Diagnosis not present

## 2022-09-11 DIAGNOSIS — F840 Autistic disorder: Secondary | ICD-10-CM | POA: Diagnosis not present

## 2022-09-11 DIAGNOSIS — F902 Attention-deficit hyperactivity disorder, combined type: Secondary | ICD-10-CM | POA: Diagnosis not present

## 2022-09-11 DIAGNOSIS — F319 Bipolar disorder, unspecified: Secondary | ICD-10-CM | POA: Diagnosis not present

## 2022-09-12 DIAGNOSIS — E291 Testicular hypofunction: Secondary | ICD-10-CM | POA: Diagnosis not present

## 2022-09-18 DIAGNOSIS — F902 Attention-deficit hyperactivity disorder, combined type: Secondary | ICD-10-CM | POA: Diagnosis not present

## 2022-09-18 DIAGNOSIS — G35 Multiple sclerosis: Secondary | ICD-10-CM | POA: Diagnosis not present

## 2022-09-18 DIAGNOSIS — F840 Autistic disorder: Secondary | ICD-10-CM | POA: Diagnosis not present

## 2022-09-18 DIAGNOSIS — R2231 Localized swelling, mass and lump, right upper limb: Secondary | ICD-10-CM | POA: Diagnosis not present

## 2022-09-18 DIAGNOSIS — I10 Essential (primary) hypertension: Secondary | ICD-10-CM | POA: Diagnosis not present

## 2022-09-18 DIAGNOSIS — F319 Bipolar disorder, unspecified: Secondary | ICD-10-CM | POA: Diagnosis not present

## 2022-09-25 DIAGNOSIS — F319 Bipolar disorder, unspecified: Secondary | ICD-10-CM | POA: Diagnosis not present

## 2022-09-25 DIAGNOSIS — F840 Autistic disorder: Secondary | ICD-10-CM | POA: Diagnosis not present

## 2022-09-25 DIAGNOSIS — F902 Attention-deficit hyperactivity disorder, combined type: Secondary | ICD-10-CM | POA: Diagnosis not present

## 2022-09-26 DIAGNOSIS — E291 Testicular hypofunction: Secondary | ICD-10-CM | POA: Diagnosis not present

## 2022-10-02 DIAGNOSIS — F319 Bipolar disorder, unspecified: Secondary | ICD-10-CM | POA: Diagnosis not present

## 2022-10-02 DIAGNOSIS — F840 Autistic disorder: Secondary | ICD-10-CM | POA: Diagnosis not present

## 2022-10-02 DIAGNOSIS — F902 Attention-deficit hyperactivity disorder, combined type: Secondary | ICD-10-CM | POA: Diagnosis not present

## 2022-10-03 IMAGING — MR MR CERVICAL SPINE WO/W CM
4 of 8 series · 18 of 48 positions shown · IV contrast (gadavist)
Comparison: Cervical spine MRI 11/21/2010. Brain MRI 05/16/2020,
concurrently performed thoracic spine MRI 08/29/2020.

CLINICAL DATA: Weakness. MRI of brain abnormal. Demyelinating
disease; abnormal MRI brain; rule out spinal cord lesions.

EXAM:
MRI CERVICAL SPINE WITHOUT AND WITH CONTRAST
TECHNIQUE: Multiplanar and multiecho pulse sequences of the cervical spine, to
include the craniocervical junction and cervicothoracic junction,
were obtained without and with intravenous contrast.
CONTRAST:  7mL GADAVIST GADOBUTROL 1 MMOL/ML IV SOLN

[Series 2: T2 · sagittal · 3.0mm · 0.43mm/px · 3 of 17 slices shown (1 of 2)]
[im 1/17]
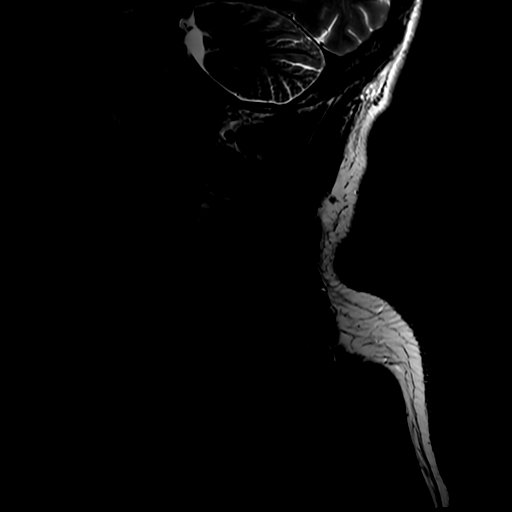
[im 9/17]
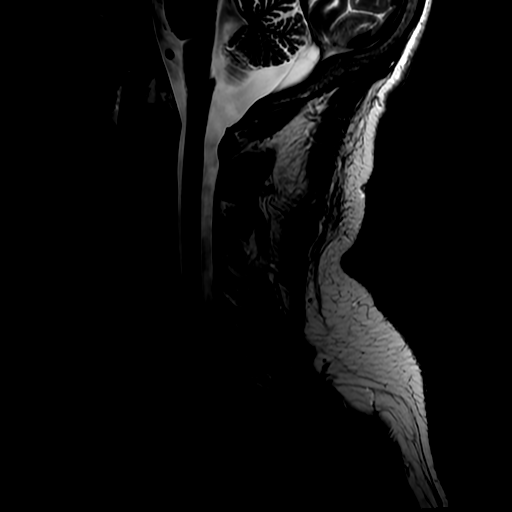
[im 17/17]
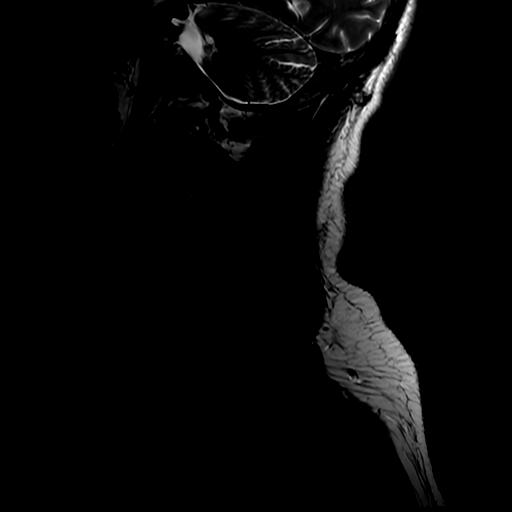

[Series 6: T2 · axial · 3.0mm · 0.35mm/px · z∈[-120,-15]mm · 6 of 34 slices shown (2 of 2)]
[im 1/34]
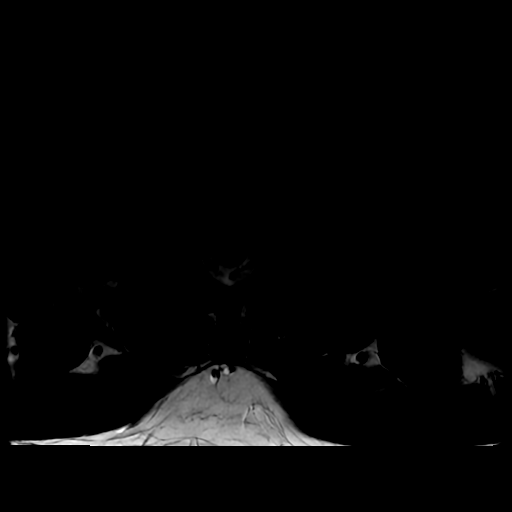
[im 7/34]
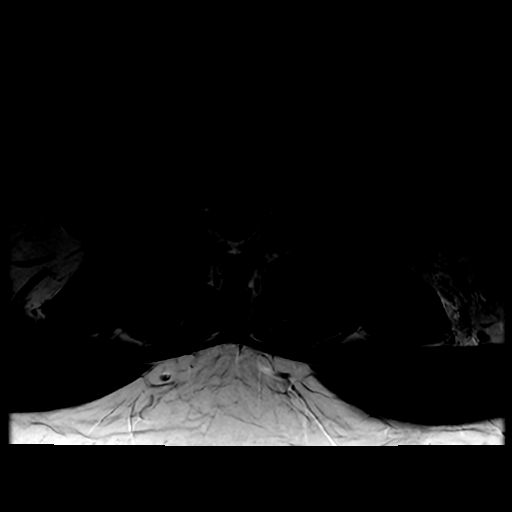
[im 14/34]
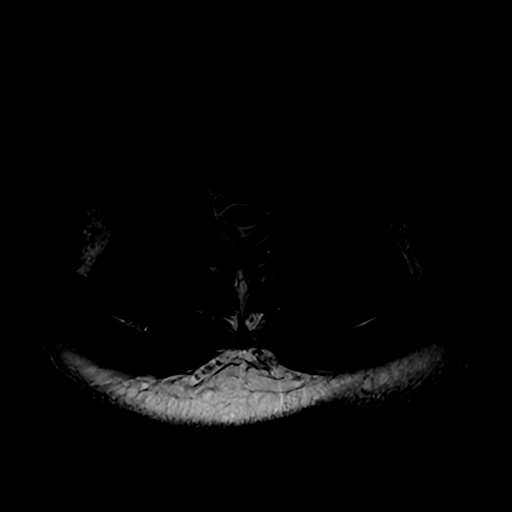
[im 20/34]
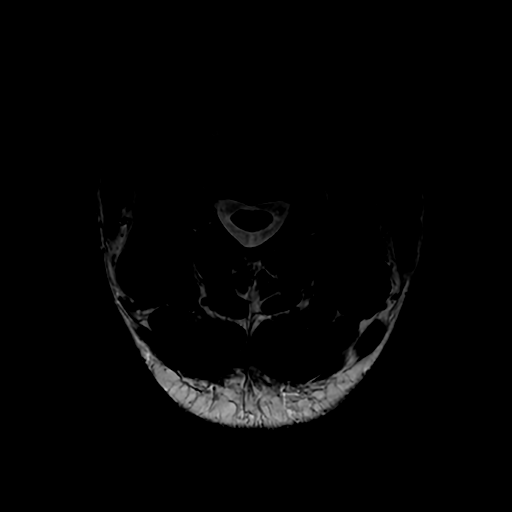
[im 27/34]
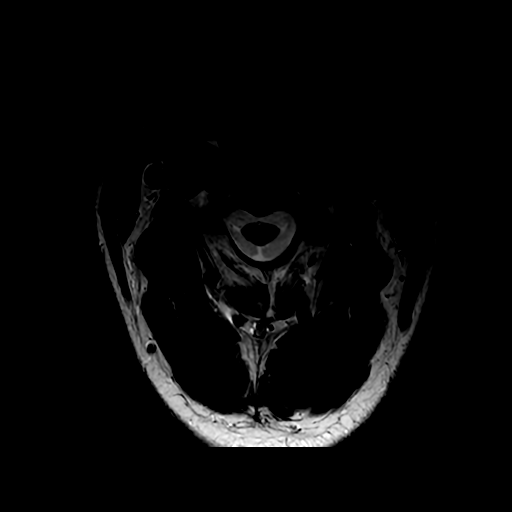
[im 34/34]
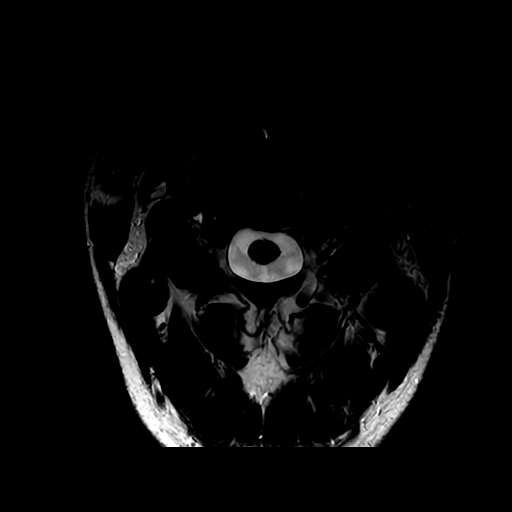

[Series 7: T1 · axial · non-contrast · 3.0mm · 0.35mm/px · z∈[-120,-15]mm · 6 of 34 slices shown]
[im 1/34]
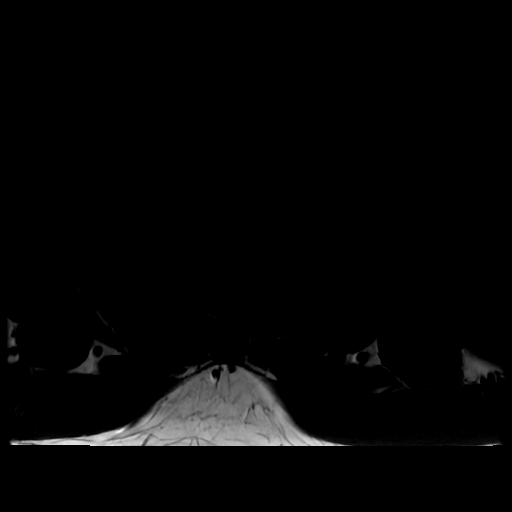
[im 7/34]
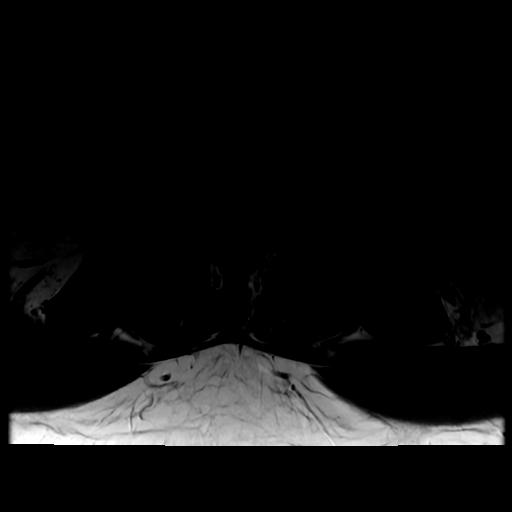
[im 14/34]
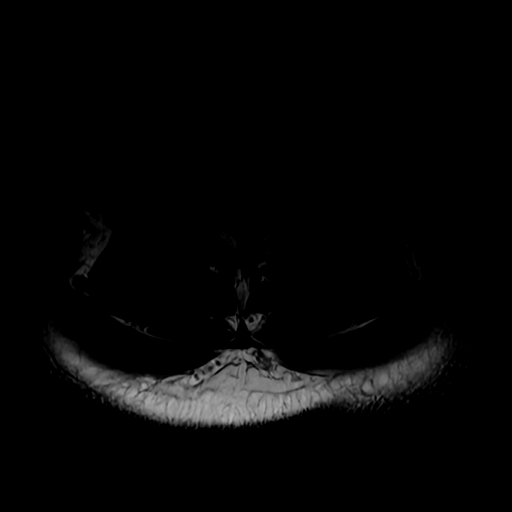
[im 20/34]
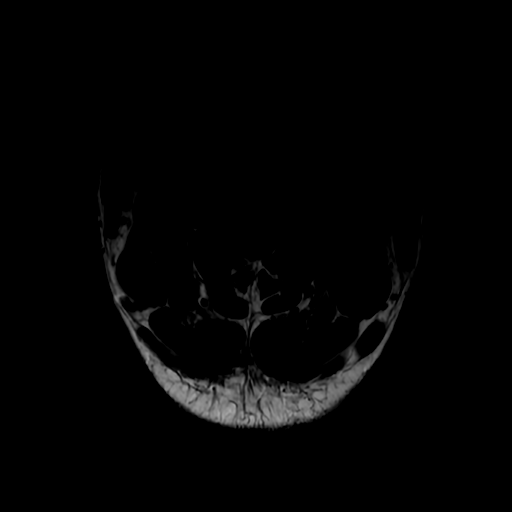
[im 27/34]
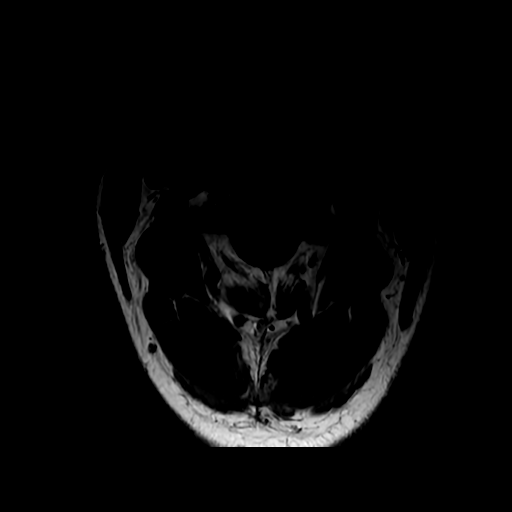
[im 34/34]
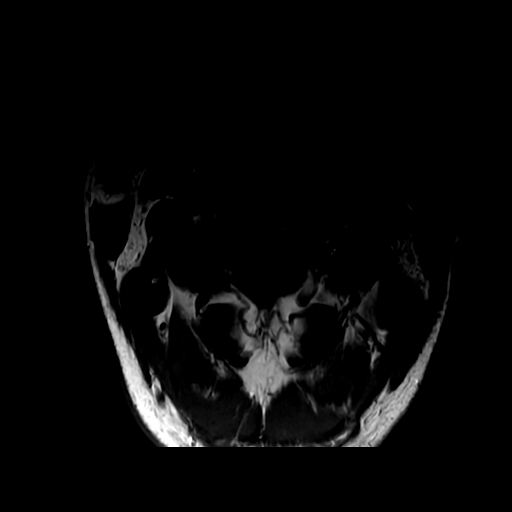

[Series 19: T1 fat-sat post-contrast · sagittal · 3.0mm · 0.43mm/px · 3 of 17 slices shown]
[im 1/17]
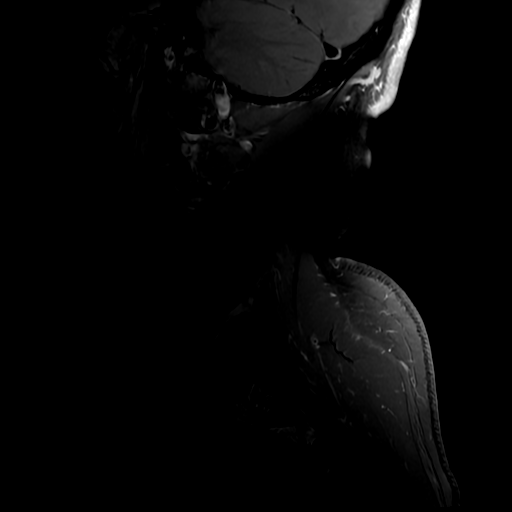
[im 9/17]
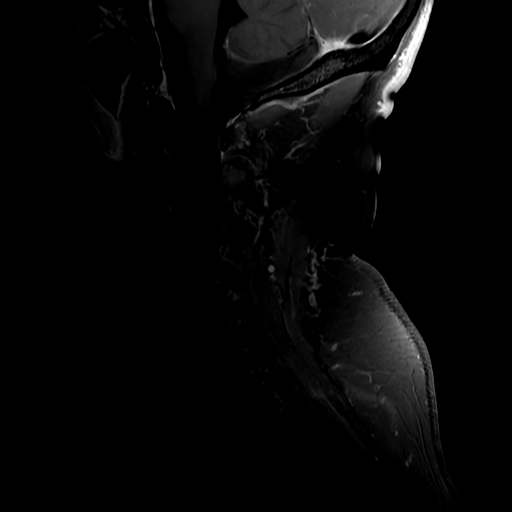
[im 17/17]
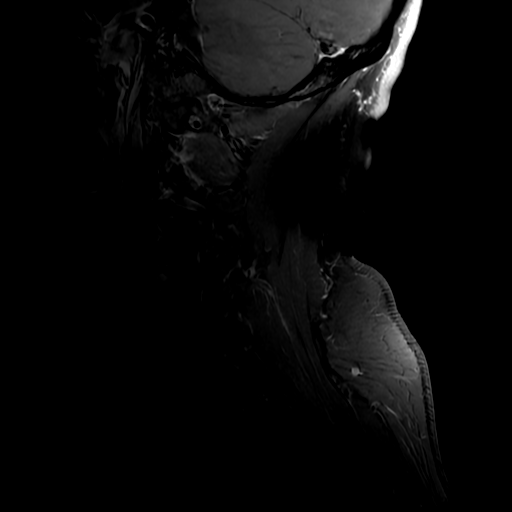

[18 of 48 positions shown; findings below may reference images not displayed]

FINDINGS: Alignment: Trace C5-C6 grade 1 retrolisthesis.

Vertebrae: Vertebral body height is maintained. No significant
marrow edema or focal suspicious osseous lesion. Small multilevel
vertebral body hemangiomas.

Cord: A tiny T2 hyperintense lesion (versus artifact) is questioned
within the right dorsolateral spinal cord at the C5 level (series 6,
image 17). No cervical spinal cord signal abnormality is identified
elsewhere. No abnormal cord enhancement.

Posterior Fossa, vertebral arteries, paraspinal tissues: Known
posterior fossa lesions were better appreciated on the prior brain
MRI of 05/16/2020. Flow voids preserved within the imaged cervical
vertebral arteries. Paraspinal soft tissues within normal limits.

Disc levels:

No more than mild disc degeneration at any level.

C5-C6: Trace retrolisthesis. Minimal disc bulge and endplate
spurring, progressed. Minimal ligamentum flavum buckling, also
progressed. No significant spinal canal or foraminal stenosis.

No significant disc herniation, spinal canal stenosis or neural
foraminal narrowing at the remaining cervical levels.
IMPRESSION: A tiny T2 hyperintense lesion is questioned within the right
dorsolateral spinal cord at the C5 level (versus artifact). No other
cervical spinal cord signal abnormality is identified. No abnormal
cord enhancement.

Mild cervical spondylosis as described. No significant spinal canal
or foraminal stenosis.

## 2022-10-03 IMAGING — MR MR THORACIC SPINE WO/W CM
5 of 9 series · 19 of 48 positions shown · IV contrast (20 MH)
Comparison: No pertinent prior exams available for comparison.

CLINICAL DATA: Weakness. MRI of brain abnormal. Demyelinating
disease; abnormal MRI; rule out spinal cord lesions.

EXAM:
MRI THORACIC WITHOUT AND WITH CONTRAST
TECHNIQUE: Multiplanar and multiecho pulse sequences of the thoracic spine were
obtained without and with intravenous contrast.
CONTRAST:  7mL GADAVIST GADOBUTROL 1 MMOL/ML IV SOLN

[Series 9: T1 · sagittal · 3.0mm · 0.90mm/px · 4 of 12 slices shown (1 of 3)]
[im 1/12]
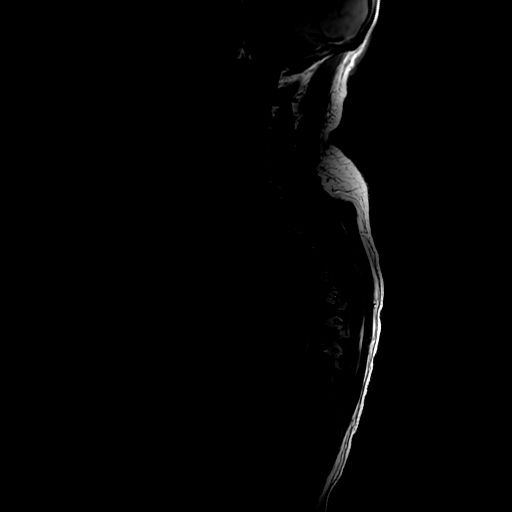
[im 4/12]
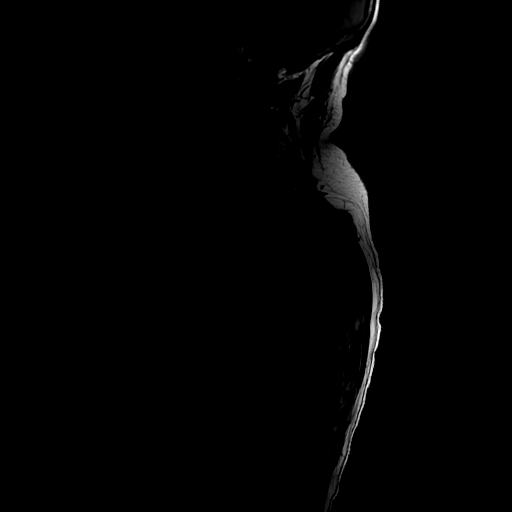
[im 8/12]
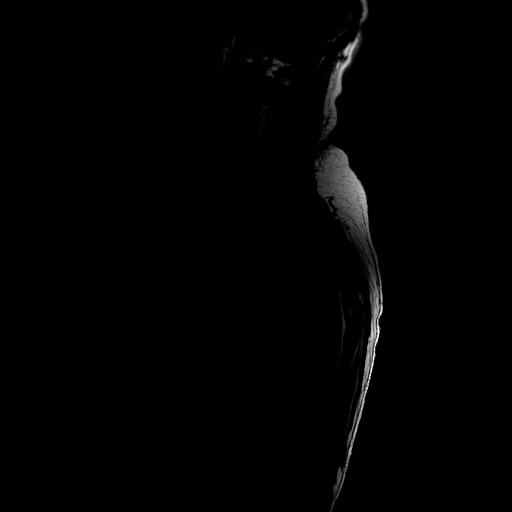
[im 12/12]
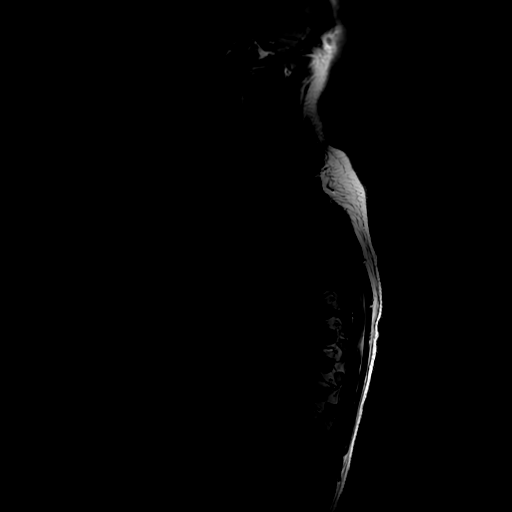

[Series 10: T2 · sagittal · 3.0mm · 0.66mm/px · 3 of 15 slices shown (1 of 2)]
[im 1/15]
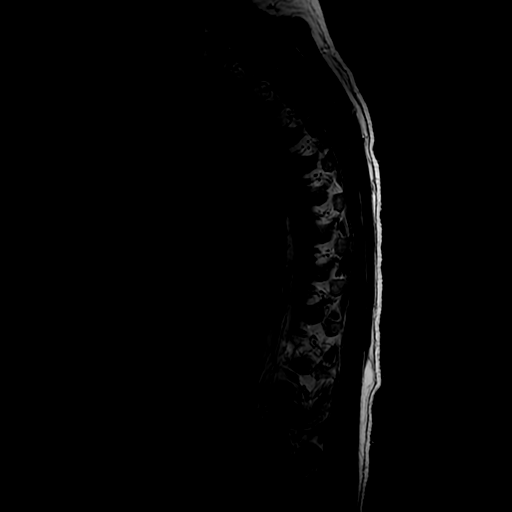
[im 8/15]
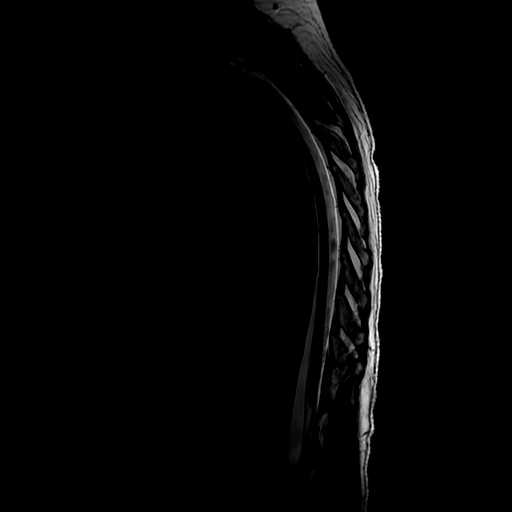
[im 15/15]
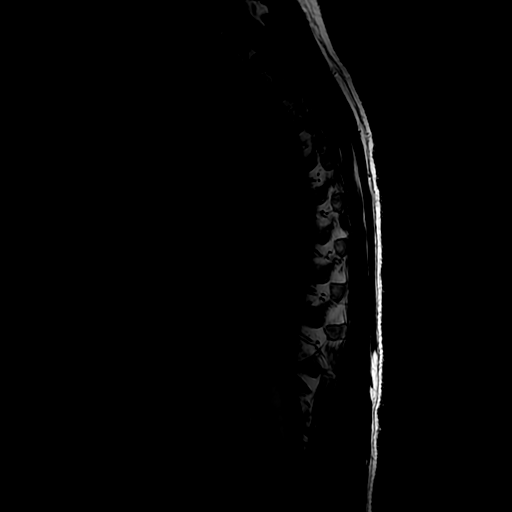

[Series 13: T1 · sagittal · 3.0mm · 0.66mm/px · 3 of 15 slices shown (2 of 3)]
[im 1/15]
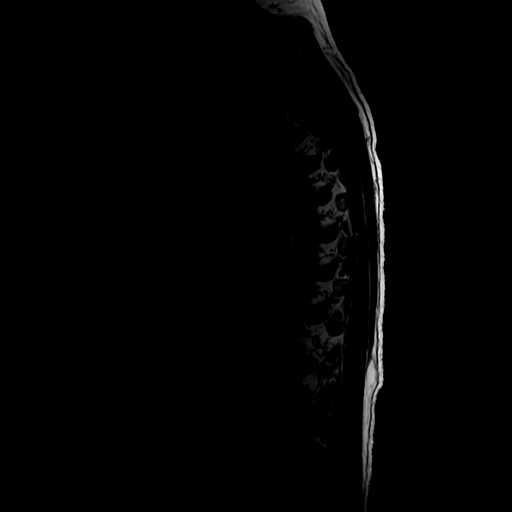
[im 8/15]
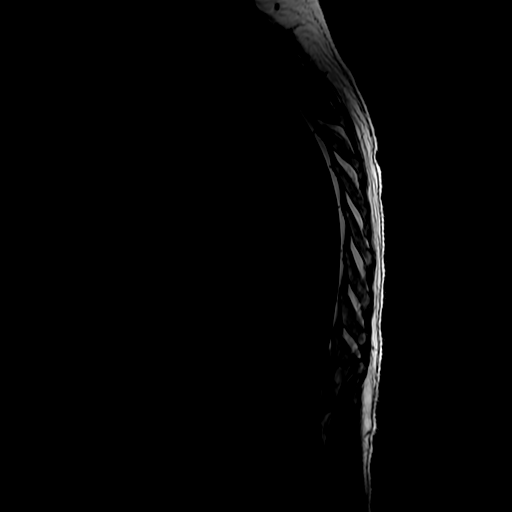
[im 15/15]
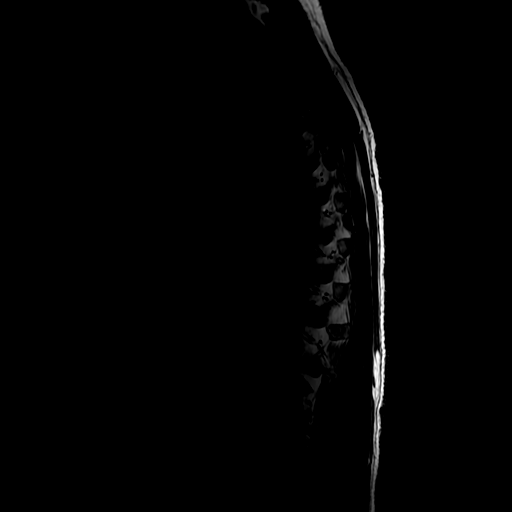

[Series 14: T2 · axial · 4.0mm · 0.39mm/px · z∈[-301,+11]mm · 8 of 36 slices shown (2 of 2)]
[im 1/36]
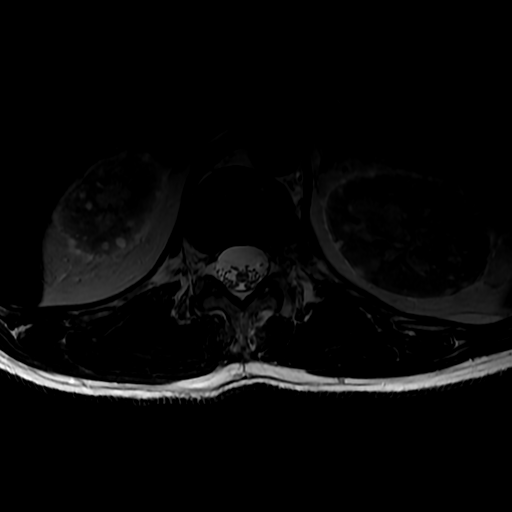
[im 6/36]
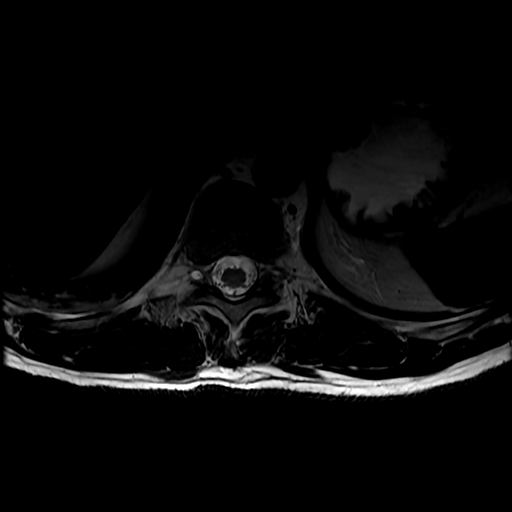
[im 11/36]
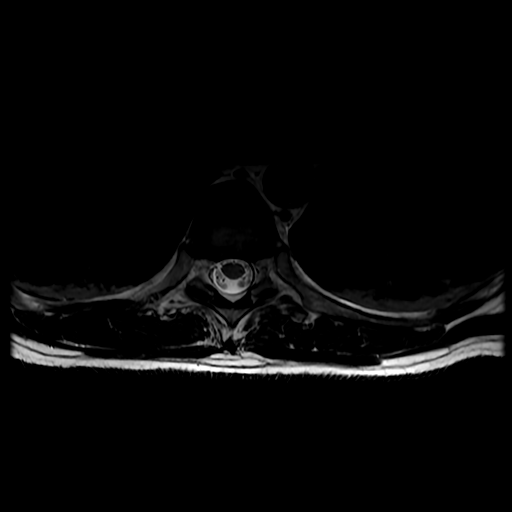
[im 16/36]
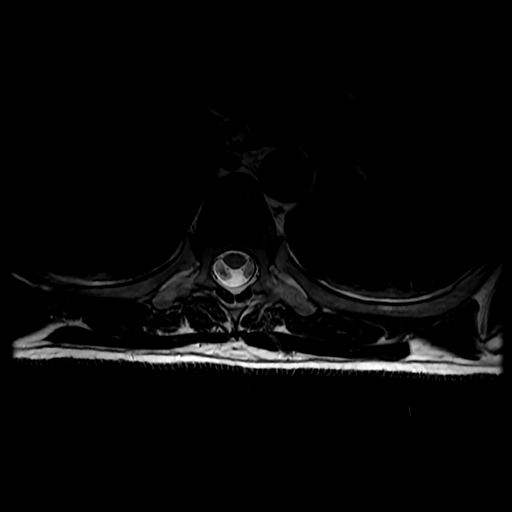
[im 21/36]
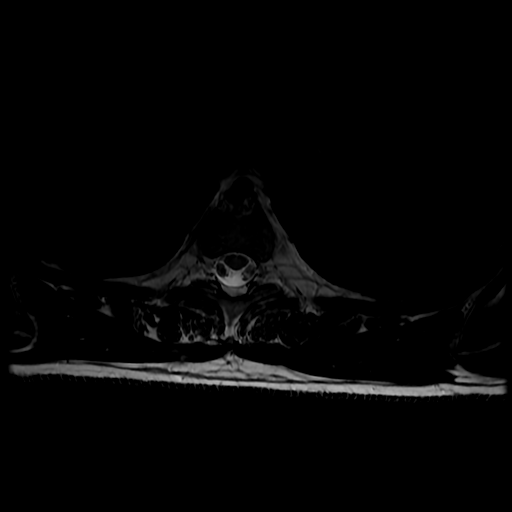
[im 26/36]
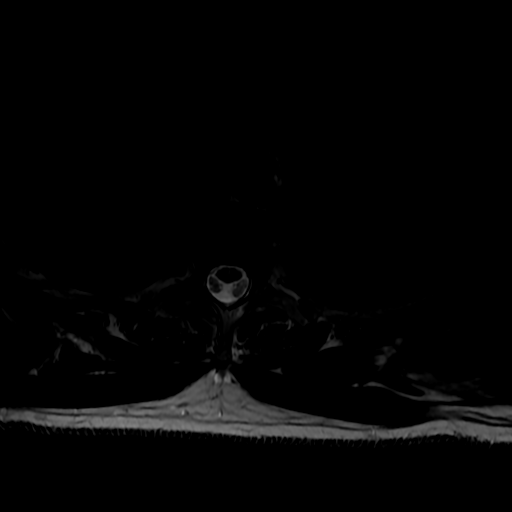
[im 31/36]
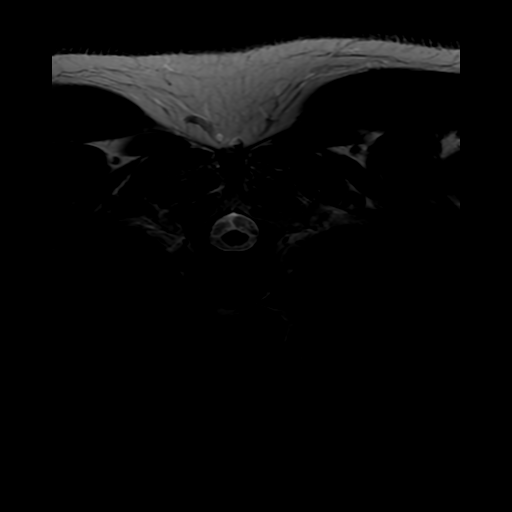
[im 36/36]
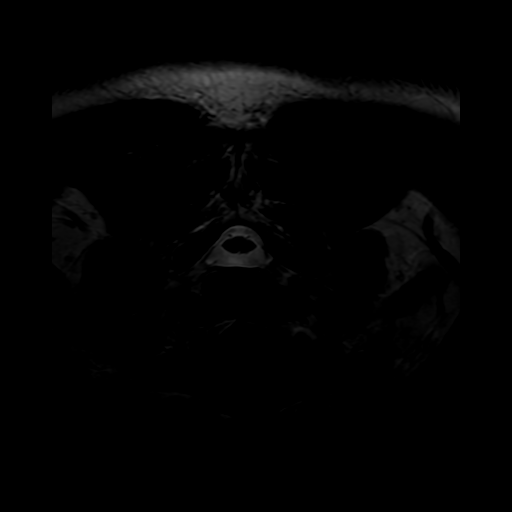

[Series 16: T1 · axial · non-contrast · 4.0mm · 0.39mm/px · 1 of 36 slices shown (3 of 3)]
[im 1/36]
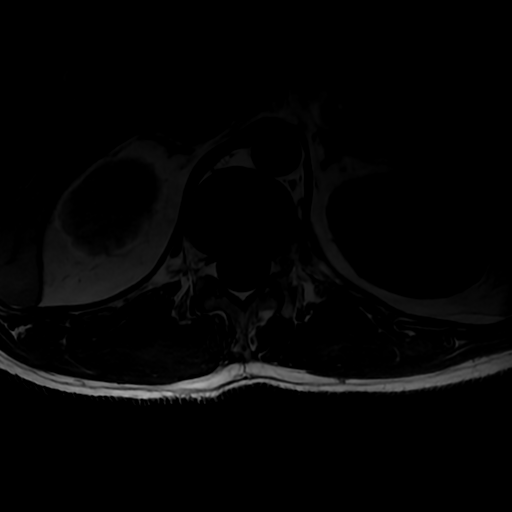

[19 of 48 positions shown; findings below may reference images not displayed]

FINDINGS: Alignment: Slightly exaggerated thoracic kyphosis. No significant
spondylolisthesis.

Vertebrae: Vertebral body height is maintained. Multilevel vertebral
body hemangiomas. No significant marrow edema or focal suspicious
osseous lesion.

Cord: A punctate T2 hyperintense focus is questioned within the left
dorsolateral spinal cord at the T10-T11 level (versus artifact)
(series 14, image 33). No signal abnormality is identified elsewhere
within the thoracic spinal cord. No abnormal cord enhancement is
demonstrated.

Paraspinal and other soft tissues: No abnormality is identified
within included portions of the thorax. There are numerous T2
hyperintense lesions within the visualized kidneys bilaterally which
appear to reflect cysts.

Disc levels:

No more than mild disc degeneration at any level. No significant
disc herniation, spinal canal stenosis or neural foraminal narrowing
at any level.
IMPRESSION: A tiny T2 hyperintense lesion is questioned within the left
dorsolateral cord at the T10-T11 level (versus artifact).

No signal abnormality is identified elsewhere within the thoracic
cord. No abnormal spinal cord enhancement.

Mild thoracic spondylosis. No significant spinal canal or foraminal
stenosis.

Numerous small cystic lesions within the imaged kidneys. Clinical
correlation is recommended. Additionally, consider renal ultrasound
for further evaluation.

## 2022-10-09 DIAGNOSIS — F319 Bipolar disorder, unspecified: Secondary | ICD-10-CM | POA: Diagnosis not present

## 2022-10-10 DIAGNOSIS — E291 Testicular hypofunction: Secondary | ICD-10-CM | POA: Diagnosis not present

## 2022-10-23 DIAGNOSIS — F319 Bipolar disorder, unspecified: Secondary | ICD-10-CM | POA: Diagnosis not present

## 2022-10-25 DIAGNOSIS — E291 Testicular hypofunction: Secondary | ICD-10-CM | POA: Diagnosis not present

## 2022-10-30 DIAGNOSIS — F319 Bipolar disorder, unspecified: Secondary | ICD-10-CM | POA: Diagnosis not present

## 2022-11-06 DIAGNOSIS — F319 Bipolar disorder, unspecified: Secondary | ICD-10-CM | POA: Diagnosis not present

## 2022-11-08 DIAGNOSIS — E291 Testicular hypofunction: Secondary | ICD-10-CM | POA: Diagnosis not present

## 2022-11-12 ENCOUNTER — Telehealth: Payer: Self-pay | Admitting: Adult Health

## 2022-11-12 ENCOUNTER — Telehealth: Payer: Self-pay | Admitting: Neurology

## 2022-11-12 NOTE — Telephone Encounter (Signed)
error 

## 2022-11-12 NOTE — Telephone Encounter (Signed)
Pt called stating that he heard about  COPAXONE 40 MG/ML SOSY coming out with a shot that is to be done once a week and he would like to know when is that coming out and if he is able do get that prescription. Please advise.

## 2022-11-13 DIAGNOSIS — F319 Bipolar disorder, unspecified: Secondary | ICD-10-CM | POA: Diagnosis not present

## 2022-11-13 NOTE — Telephone Encounter (Signed)
Phone room, please call pt and relay Dr. Bonnita Hollow message. Thank you

## 2022-11-13 NOTE — Telephone Encounter (Signed)
Called pt Informed his of message Dr. Epimenio Foot send. Pt said Okay, thank you for calling me back.

## 2022-11-18 ENCOUNTER — Telehealth: Payer: Self-pay | Admitting: Adult Health

## 2022-11-18 DIAGNOSIS — R531 Weakness: Secondary | ICD-10-CM

## 2022-11-18 DIAGNOSIS — R269 Unspecified abnormalities of gait and mobility: Secondary | ICD-10-CM

## 2022-11-18 DIAGNOSIS — G35 Multiple sclerosis: Secondary | ICD-10-CM

## 2022-11-18 NOTE — Telephone Encounter (Signed)
Pt would like for Dr Epimenio Foot to place an order for additional PT

## 2022-11-18 NOTE — Addendum Note (Signed)
Addended by: Judi Cong on: 11/18/2022 12:55 PM   Modules accepted: Orders

## 2022-11-18 NOTE — Telephone Encounter (Signed)
Order placed for referral to PT

## 2022-11-20 DIAGNOSIS — F319 Bipolar disorder, unspecified: Secondary | ICD-10-CM | POA: Diagnosis not present

## 2022-11-22 DIAGNOSIS — E291 Testicular hypofunction: Secondary | ICD-10-CM | POA: Diagnosis not present

## 2022-11-26 ENCOUNTER — Ambulatory Visit: Payer: PPO | Attending: Neurology | Admitting: Physical Therapy

## 2022-11-26 ENCOUNTER — Encounter: Payer: Self-pay | Admitting: Physical Therapy

## 2022-11-26 ENCOUNTER — Other Ambulatory Visit: Payer: Self-pay

## 2022-11-26 DIAGNOSIS — R531 Weakness: Secondary | ICD-10-CM | POA: Diagnosis not present

## 2022-11-26 DIAGNOSIS — G35 Multiple sclerosis: Secondary | ICD-10-CM | POA: Diagnosis not present

## 2022-11-26 DIAGNOSIS — R269 Unspecified abnormalities of gait and mobility: Secondary | ICD-10-CM | POA: Diagnosis not present

## 2022-11-26 DIAGNOSIS — R2681 Unsteadiness on feet: Secondary | ICD-10-CM | POA: Insufficient documentation

## 2022-11-26 DIAGNOSIS — Z9181 History of falling: Secondary | ICD-10-CM | POA: Diagnosis not present

## 2022-11-26 DIAGNOSIS — M6281 Muscle weakness (generalized): Secondary | ICD-10-CM | POA: Insufficient documentation

## 2022-11-26 DIAGNOSIS — R278 Other lack of coordination: Secondary | ICD-10-CM | POA: Diagnosis not present

## 2022-11-26 NOTE — Therapy (Signed)
OUTPATIENT PHYSICAL THERAPY NEURO EVALUATION   Patient Name: George [name] MRN: [id number] DOB:1971/02/12, 52 y.o., male Today's Date: 11/26/2022   PCP: Kirby Funk, MD REFERRING PROVIDER: Asa Lente, MD  END OF SESSION:  11/26/22 1118  PT Visits / Re-Eval  Visit Number 1  Number of Visits 9 (8 + eval)  Date for PT Re-Evaluation 01/31/23 (pushed out due to scheduling preferences of patient)  Authorization  Authorization Type [employer]  PT Time Calculation  PT Start Time 1103  PT Stop Time 1146  PT Time Calculation (min) 43 min  PT - End of Session  Equipment Utilized During Treatment Gait belt  Behavior During Therapy Impulsive   Past Medical History:  Diagnosis Date   Anxiety    Asperger's syndrome    Autistic spectrum disorder    Asperger's (per H&P Olivia Clelland, PA)   Bipolar 1 disorder    "no mood swings in 20 years"  on medication   GERD (gastroesophageal reflux disease)    Head injury 2020   Hypertension    Hypothyroidism    IBS (irritable bowel syndrome)    Past Surgical History:  Procedure Laterality Date   NO PAST SURGERIES     RADIOLOGY WITH ANESTHESIA N/A 05/16/2020   Procedure: MRI BRAIN WITH AND WITHOUT CONTRAST;  Surgeon: Radiologist, Medication, MD;  Location: [facility] OR;  Service: Radiology;  Laterality: N/A;   RADIOLOGY WITH ANESTHESIA N/A 08/29/2020   Procedure: MRI WITH ANESTHESIA CERVICAL SPINE WITH AND WITHOUT CONTRAST, THORACIC SPINE WITH AND WITHOUT CONTRAST;  Surgeon: Radiologist, Medication, MD;  Location: [facility] OR;  Service: Radiology;  Laterality: N/A;   RADIOLOGY WITH ANESTHESIA N/A 03/15/2021   Procedure: MRI BRAIN WITH AND WITHOUT, CERIVAL WITH AND WITHOUT;  Surgeon: Radiologist, Medication, MD;  Location: [facility] OR;  Service: Radiology;  Laterality: N/A;   Patient Active Problem List   Diagnosis Date Noted   Bipolar disease, chronic 06/20/2021   Slurred speech 02/26/2021   Autism 12/20/2020   Multiple sclerosis  10/11/2020   MRI of brain abnormal 10/11/2020   Right sided weakness 10/11/2020   Gait disturbance 10/11/2020   Asperger syndrome 03/19/2013    ONSET DATE: 11/18/2022 (referral date)  REFERRING DIAG: G35 (ICD-10-CM) - Multiple sclerosis R26.9 (ICD-10-CM) - Gait disturbance R53.1 (ICD-10-CM) - Right sided weakness R53.1 (ICD-10-CM) - Weakness  THERAPY DIAG:  Other lack of coordination  Muscle weakness (generalized)  Unsteadiness on feet  History of falling  Rationale for Evaluation and Treatment: Rehabilitation  SUBJECTIVE:  SUBJECTIVE STATEMENT: "I have foot drop and I would like exercises to manage it.  I don't want bracing if I can help it."  He reports he is exercising everyday. Pt accompanied by: self - he drives himself  PERTINENT HISTORY: Autism, MS, Bipolar 1, HTN, head injury 2020  FROM MD NOTE:  "MS HISTORY: He has a h/o Asperger syndrome and bipolar disease.   In 2012, he had an MRI of the brain and was found to have multiple T2/FLAIR hyperintense foci in the brain.   A lumbar puncture at the time was negative.    Over the last year, he began to have more difficulty with right hand coordination and left foot drop.    Also the right side of his face was drooping some.     He came in to see Dr. Marjory Lies and had repeat imaging studies showing progression of the white matter foci and the appearance of a small focus at C5.    Repeat LP, however, showed negative CSF (no OCB) again.   He started glatiramer 10/2020."  PAIN:  Are you having pain? No  PRECAUTIONS: Fall  WEIGHT BEARING RESTRICTIONS: No  FALLS: Has patient fallen in last 6 months? No  LIVING ENVIRONMENT: Lives with: lives alone Lives in: House/apartment Stairs: Yes: External: 2 steps; none Has following equipment at home:  Grab bars and Ramped entry-he uses ramp w/o issue  PLOF: Independent  PATIENT GOALS: "to strengthen my ankles and for my throat" - PT to obtain ST referral.  OBJECTIVE:   DIAGNOSTIC FINDINGS: Imaging studies reviewed: MRI of the brain 11/21/2010 shows multiple T2/FLAIR hyperintense foci including a focus in the left pons, middle cerebellar peduncle, right thalamus.  Some foci of periventricular and some foci are juxtacortical.  None of the foci enhanced.   MRI of the brain 05/16/2020 showed a similar pattern at the 2012 MRI but there has been mild progression with at least 2 new lesions noted in the white matter compared to the previous MRI.  As seen before there is a large focus involving the left greater than right pons.  It may be slightly larger.  There is also involvement of the middle cerebellar peduncle which is stable on the right but a new focus on the left.  There is one stable focus in the right thalamus.   MRI of the cervical spine 08/29/2020 showed a small focus posteriorly to the right adjacent to C5.  Mild DJD at C5-C6 not causing nerve root compression.   MRI of the thoracic spine 08/29/2020 showed a normal spinal cord (subtle focus at T10-T11 more likely artifact).  There are cystic lesions within the kidneys   MRI of the brain 03/15/2021 and it was unchanged.   No new foci. IMPRESSION: Multiple supratentorial and infratentorial T2 hyperintense lesions consistent with the clinical diagnosis of multiple sclerosis. No new or enhancing lesion identified.   MRI of the cervical spine 03/15/2021 showed a focus to the right at [facility].  COGNITION: Overall cognitive status: Within functional limits for tasks assessed   SENSATION: Light touch: WFL  COORDINATION: RLE more dysmetric than LLE noted on LE RAMS and heel-to-shin assessments.  EDEMA:  None noted in BLE  MUSCLE TONE: None noted in BLE during functional tasks.  POSTURE: No Significant postural limitations  LOWER  EXTREMITY ROM:     Active  Right Eval Left Eval  Hip flexion [facility]  Hip extension   Hip abduction   Hip adduction   Hip internal rotation  Hip external rotation   Knee flexion   Knee extension   Ankle dorsiflexion   Ankle plantarflexion   Ankle inversion    Ankle eversion     (Blank rows = not tested)  LOWER EXTREMITY MMT:    MMT Right Eval Left Eval  Hip flexion 4/5 4+/5  Hip extension    Hip abduction    Hip adduction    Hip internal rotation    Hip external rotation    Knee flexion    Knee extension 4+/5 5/5  Ankle dorsiflexion 3+/5 4+/5  Ankle plantarflexion    Ankle inversion    Ankle eversion    (Blank rows = not tested)  BED MOBILITY:  Sit to supine Complete Independence Supine to sit Complete Independence  TRANSFERS: Assistive device utilized: None  Sit to stand: Complete Independence Stand to sit: Complete Independence Chair to chair: Complete Independence  GAIT: Gait pattern: decreased arm swing- Right, decreased arm swing- Left, decreased stride length, decreased hip/knee flexion- Right, and decreased ankle dorsiflexion- Right Distance walked: various clinic distances Assistive device utilized: None Level of assistance: SBA Comments: Intermittent right knee hyperextension and steppage gait to compensate for foot drop.  FUNCTIONAL TESTS:  5 times sit to stand: 10.75 seconds w/ intermittent LUE support Functional gait assessment: 19/30  [facility] PT Assessment - 11/26/22 1141       Functional Gait  Assessment   Gait assessed  Yes    Gait Level Surface Walks 20 ft in less than 7 sec but greater than 5.5 sec, uses assistive device, slower speed, mild gait deviations, or deviates 6-10 in outside of the 12 in walkway width.    Change in Gait Speed Able to change speed, demonstrates mild gait deviations, deviates 6-10 in outside of the 12 in walkway width, or no gait deviations, unable to achieve a major change in velocity, or uses a change in  velocity, or uses an assistive device.    Gait with Horizontal Head Turns Performs head turns smoothly with slight change in gait velocity (eg, minor disruption to smooth gait path), deviates 6-10 in outside 12 in walkway width, or uses an assistive device.    Gait with Vertical Head Turns Performs task with slight change in gait velocity (eg, minor disruption to smooth gait path), deviates 6 - 10 in outside 12 in walkway width or uses assistive device    Gait and Pivot Turn Pivot turns safely within 3 sec and stops quickly with no loss of balance.    Step Over Obstacle Is able to step over one shoe box (4.5 in total height) without changing gait speed. No evidence of imbalance.    Gait with Narrow Base of Support Ambulates 4-7 steps.   4 steps without LOB   Gait with Eyes Closed Walks 20 ft, slow speed, abnormal gait pattern, evidence for imbalance, deviates 10-15 in outside 12 in walkway width. Requires more than 9 sec to ambulate 20 ft.    Ambulating Backwards Walks 20 ft, uses assistive device, slower speed, mild gait deviations, deviates 6-10 in outside 12 in walkway width.   R foot drag intermittently   Steps Alternating feet, must use rail.    Total Score 19    FGA comment: 19/30 = high fall risk            PATIENT SURVEYS:  None relevant to CC.  TODAY'S TREATMENT:  DATE: N/A    PATIENT EDUCATION: Education details: PT POC, assessments used, and goals to be set.  Obtaining ST referral for swallowing. Person educated: Patient Education method: Explanation Education comprehension: verbalized understanding  HOME EXERCISE PROGRAM: To be established.  GOALS: Goals reviewed with patient? Yes  SHORT TERM GOALS: Target date: 12/27/2022  Pt will be independent with initial strength and balance HEP to address right LE weakness and foot drop and maintain  progress from PT. Baseline:  To be established. Goal status: INITIAL  2.  Pt will improve FGA score to >/=23/30 in order to demonstrate improved balance and decreased fall risk. Baseline: 19/30 Goal status: INITIAL  3.  Pt will trial right foot-up brace to assess benefit to safe walking mechanics and managing foot-drop with purchase as desired. Baseline: To be assessed. Goal status: INITIAL  LONG TERM GOALS: Target date: 01/24/2023  Pt will report adherence to walking program x3 days or more per week in order to maintain BLE strength and to continue practicing safe walking mechanics. Baseline: To be established. Goal status: INITIAL  2.  Pt will improve FGA score to >/=27/30 in order to demonstrate improved balance and decreased fall risk. Baseline: 19/30 Goal status: INITIAL  3.  AFO to be trialed with order placed as appropriate if pt is agreeable and foot-up brace is not a better option. Baseline: To be assessed. Goal status: INITIAL  ASSESSMENT:  CLINICAL IMPRESSION: Patient is a 52 y.o. male who was seen today for physical therapy evaluation and treatment for right LE weakness, gait abnormality, and right foot drop.  Pt has a significant PMH of Autism, MS, Bipolar 1, HTN, and a head injury in 2020.  Identified impairments include RLE dysmetria, consistent right foot slap, and dynamic balance impaired by foot drop.  Evaluation via the following assessment tools: FGA indicates moderate to high fall risk.  He would benefit from skilled PT to address impairments as noted, improve self-management, and progress towards long term goals.  OBJECTIVE IMPAIRMENTS: Abnormal gait, decreased balance, decreased coordination, and decreased strength.   ACTIVITY LIMITATIONS: stairs and locomotion level  PARTICIPATION LIMITATIONS: driving and community activity  PERSONAL FACTORS: Behavior pattern, Past/current experiences, Time since onset of injury/illness/exacerbation, and 1-2 comorbidities:  Autism-sensory intolerances limiting bracing options, MS  are also affecting patient's functional outcome.   REHAB POTENTIAL: Excellent  CLINICAL DECISION MAKING: Stable/uncomplicated  EVALUATION COMPLEXITY: Low  PLAN:  PT FREQUENCY: 1x/week  PT DURATION: 8 weeks  PLANNED INTERVENTIONS: Therapeutic exercises, Therapeutic activity, Neuromuscular re-education, Balance training, Gait training, Patient/Family education, Self Care, Joint mobilization, Stair training, Vestibular training, Orthotic/Fit training, DME instructions, and Re-evaluation  PLAN FOR NEXT SESSION: Initiate HEP for RLE NMR/strength/foot drop.  Trial foot-up-pt does not want AFO (re-discuss if beneficial).  Establish walking program.  Stair training? (Pt uses ramp at home, but unsure if permanent install)   Sadie Haber, PT, DPT 11/26/2022, 11:47 AM

## 2022-11-27 ENCOUNTER — Telehealth: Payer: Self-pay | Admitting: Physical Therapy

## 2022-11-27 ENCOUNTER — Other Ambulatory Visit: Payer: Self-pay | Admitting: Neurology

## 2022-11-27 DIAGNOSIS — Z1211 Encounter for screening for malignant neoplasm of colon: Secondary | ICD-10-CM | POA: Diagnosis not present

## 2022-11-27 DIAGNOSIS — F319 Bipolar disorder, unspecified: Secondary | ICD-10-CM | POA: Diagnosis not present

## 2022-11-27 DIAGNOSIS — E291 Testicular hypofunction: Secondary | ICD-10-CM | POA: Diagnosis not present

## 2022-11-27 DIAGNOSIS — R1310 Dysphagia, unspecified: Secondary | ICD-10-CM

## 2022-11-27 DIAGNOSIS — Z0000 Encounter for general adult medical examination without abnormal findings: Secondary | ICD-10-CM | POA: Diagnosis not present

## 2022-11-27 DIAGNOSIS — Z125 Encounter for screening for malignant neoplasm of prostate: Secondary | ICD-10-CM | POA: Diagnosis not present

## 2022-11-27 DIAGNOSIS — E785 Hyperlipidemia, unspecified: Secondary | ICD-10-CM | POA: Diagnosis not present

## 2022-11-27 DIAGNOSIS — G35 Multiple sclerosis: Secondary | ICD-10-CM | POA: Diagnosis not present

## 2022-11-27 DIAGNOSIS — Z79899 Other long term (current) drug therapy: Secondary | ICD-10-CM | POA: Diagnosis not present

## 2022-11-27 DIAGNOSIS — E039 Hypothyroidism, unspecified: Secondary | ICD-10-CM | POA: Diagnosis not present

## 2022-11-27 DIAGNOSIS — F845 Asperger's syndrome: Secondary | ICD-10-CM | POA: Diagnosis not present

## 2022-11-27 DIAGNOSIS — I10 Essential (primary) hypertension: Secondary | ICD-10-CM | POA: Diagnosis not present

## 2022-11-27 NOTE — Telephone Encounter (Signed)
Dr. Epimenio Foot, S[place] Zahradnik was evaluated by physical therapy on 11/26/2022.  The patient would benefit from Speech Therapy evaluation for swallowing/frequent coughing w/ ingestion/fear of aspiration.   If you agree, please place an order in [facility] workque in [facility] or fax the order to [phone]. Thank you, Camille Bal, PT, DPT  [facility] [address] [place], [place]  [address] Phone:  [phone] Fax:  [phone]

## 2022-12-02 ENCOUNTER — Ambulatory Visit: Payer: PPO | Admitting: Physical Therapy

## 2022-12-02 ENCOUNTER — Encounter: Payer: Self-pay | Admitting: Physical Therapy

## 2022-12-02 DIAGNOSIS — R278 Other lack of coordination: Secondary | ICD-10-CM | POA: Diagnosis not present

## 2022-12-02 DIAGNOSIS — M6281 Muscle weakness (generalized): Secondary | ICD-10-CM

## 2022-12-02 DIAGNOSIS — Z9181 History of falling: Secondary | ICD-10-CM

## 2022-12-02 DIAGNOSIS — R2681 Unsteadiness on feet: Secondary | ICD-10-CM

## 2022-12-02 NOTE — Patient Instructions (Signed)
Access Code: [email or web address] URL: [email or web address] Date: 12/02/2022 Prepared by: Camille Bal  Exercises - Seated Ankle Alphabet  - 1 x daily - 7 x weekly - 3 sets - 10 reps - Seated Heel Toe Raises  - 1 x daily - 5 x weekly - 3 sets - 4-5 reps - 3 seconds each hold - Ankle Dorsiflexion with Resistance  - 1 x daily - 5 x weekly - 1-2 sets - 10 reps - Staggered Sit-to-Stand  - 1 x daily - 7 x weekly - 2 sets - 10 reps

## 2022-12-02 NOTE — Therapy (Signed)
OUTPATIENT PHYSICAL THERAPY NEURO TREATMENT   Patient Name: George [name] MRN: [id number] DOB:Oct 10, 1970, 52 y.o., male Today's Date: 12/02/2022   PCP: Kirby Funk, MD REFERRING PROVIDER: Asa Lente, MD  END OF SESSION:  PT End of Session - 12/02/22 1113     Visit Number 2    Number of Visits 9   8 + eval   Date for PT Re-Evaluation 01/31/23   pushed out due to scheduling preferences of patient   Authorization Type [employer]    PT Start Time 1106    PT Stop Time 1148    PT Time Calculation (min) 42 min    Equipment Utilized During Treatment Gait belt    Behavior During Therapy Impulsive            Past Medical History:  Diagnosis Date   Anxiety    Asperger's syndrome    Autistic spectrum disorder    Asperger's (per H&P Olivia Clelland, PA)   Bipolar 1 disorder (HCC)    "no mood swings in 20 years"  on medication   GERD (gastroesophageal reflux disease)    Head injury 2020   Hypertension    Hypothyroidism    IBS (irritable bowel syndrome)    Past Surgical History:  Procedure Laterality Date   NO PAST SURGERIES     RADIOLOGY WITH ANESTHESIA N/A 05/16/2020   Procedure: MRI BRAIN WITH AND WITHOUT CONTRAST;  Surgeon: Radiologist, Medication, MD;  Location: [facility] OR;  Service: Radiology;  Laterality: N/A;   RADIOLOGY WITH ANESTHESIA N/A 08/29/2020   Procedure: MRI WITH ANESTHESIA CERVICAL SPINE WITH AND WITHOUT CONTRAST, THORACIC SPINE WITH AND WITHOUT CONTRAST;  Surgeon: Radiologist, Medication, MD;  Location: [facility];  Service: Radiology;  Laterality: N/A;   RADIOLOGY WITH ANESTHESIA N/A 03/15/2021   Procedure: MRI BRAIN WITH AND WITHOUT, CERIVAL WITH AND WITHOUT;  Surgeon: Radiologist, Medication, MD;  Location: [facility];  Service: Radiology;  Laterality: N/A;   Patient Active Problem List   Diagnosis Date Noted   Bipolar disease, chronic (HCC) 06/20/2021   Slurred speech 02/26/2021   Autism 12/20/2020   Multiple sclerosis (HCC) 10/11/2020   MRI of  brain abnormal 10/11/2020   Right sided weakness 10/11/2020   Gait disturbance 10/11/2020   Asperger syndrome 03/19/2013    ONSET DATE: 11/18/2022 (referral date)  REFERRING DIAG: G35 (ICD-10-CM) - Multiple sclerosis R26.9 (ICD-10-CM) - Gait disturbance R53.1 (ICD-10-CM) - Right sided weakness R53.1 (ICD-10-CM) - Weakness  THERAPY DIAG:  Other lack of coordination  Muscle weakness (generalized)  Unsteadiness on feet  History of falling  Rationale for Evaluation and Treatment: Rehabilitation  SUBJECTIVE:  SUBJECTIVE STATEMENT: Pt recounts exercise routine and concerns about right knee hyperextension.  He expresses concern about possible progression of his MS.  He had an episode of hyperextension in his right knee when walking to the grocery store that scared him as this doesn't usually happen. Pt accompanied by: self - he drives himself  PERTINENT HISTORY: Autism, MS, Bipolar 1, HTN, head injury 2020  FROM MD NOTE:  "MS HISTORY: He has a h/o Asperger syndrome and bipolar disease.  In 2012, he had an MRI of the brain and was found to have multiple T2/FLAIR hyperintense foci in the brain.  A lumbar puncture at the time was negative.  Over the last year, he began to have more difficulty with right hand coordination and left foot drop.  Also the right side of his face was drooping some.  He came in to see Dr. Marjory Lies and had repeat imaging studies showing progression of the white matter foci and the appearance of a small focus at C5.  Repeat LP, however, showed negative CSF (no OCB) again.  He started glatiramer 10/2020."  PAIN:  Are you having pain? No - some low back pain lingering intermittently from when he was volunteering.  PRECAUTIONS: Fall  WEIGHT BEARING RESTRICTIONS: No  FALLS: Has  patient fallen in last 6 months? No  LIVING ENVIRONMENT: Lives with: lives alone Lives in: House/apartment Stairs: Yes: External: 2 steps; none Has following equipment at home: Grab bars and Ramped entry-he uses ramp w/o issue  PLOF: Independent  PATIENT GOALS: "to strengthen my ankles and for my throat" - PT to obtain ST referral.  OBJECTIVE:   DIAGNOSTIC FINDINGS: Imaging studies reviewed: MRI of the brain 11/21/2010 shows multiple T2/FLAIR hyperintense foci including a focus in the left pons, middle cerebellar peduncle, right thalamus.  Some foci of periventricular and some foci are juxtacortical.  None of the foci enhanced.   MRI of the brain 05/16/2020 showed a similar pattern at the 2012 MRI but there has been mild progression with at least 2 new lesions noted in the white matter compared to the previous MRI.  As seen before there is a large focus involving the left greater than right pons.  It may be slightly larger.  There is also involvement of the middle cerebellar peduncle which is stable on the right but a new focus on the left.  There is one stable focus in the right thalamus.   MRI of the cervical spine 08/29/2020 showed a small focus posteriorly to the right adjacent to C5.  Mild DJD at C5-C6 not causing nerve root compression.   MRI of the thoracic spine 08/29/2020 showed a normal spinal cord (subtle focus at T10-T11 more likely artifact).  There are cystic lesions within the kidneys   MRI of the brain 03/15/2021 and it was unchanged.   No new foci. IMPRESSION: Multiple supratentorial and infratentorial T2 hyperintense lesions consistent with the clinical diagnosis of multiple sclerosis. No new or enhancing lesion identified.   MRI of the cervical spine 03/15/2021 showed a focus to the right at [facility].  COGNITION: Overall cognitive status: Within functional limits for tasks assessed   SENSATION: Light touch: WFL  COORDINATION: RLE more dysmetric than LLE noted on LE  RAMS and heel-to-shin assessments.  EDEMA:  None noted in BLE  MUSCLE TONE: None noted in BLE during functional tasks.  POSTURE: No Significant postural limitations  LOWER EXTREMITY ROM:     Active  Right Eval Left Eval  Hip flexion [facility]  Hip extension   Hip abduction   Hip adduction   Hip internal rotation   Hip external rotation   Knee flexion   Knee extension   Ankle dorsiflexion   Ankle plantarflexion   Ankle inversion    Ankle eversion     (Blank rows = not tested)  LOWER EXTREMITY MMT:    MMT Right Eval Left Eval  Hip flexion 4/5 4+/5  Hip extension    Hip abduction    Hip adduction    Hip internal rotation    Hip external rotation    Knee flexion    Knee extension 4+/5 5/5  Ankle dorsiflexion 3+/5 4+/5  Ankle plantarflexion    Ankle inversion    Ankle eversion    (Blank rows = not tested)  BED MOBILITY:  Sit to supine Complete Independence Supine to sit Complete Independence  TRANSFERS: Assistive device utilized: None  Sit to stand: Complete Independence Stand to sit: Complete Independence Chair to chair: Complete Independence  GAIT: Gait pattern: decreased arm swing- Right, decreased arm swing- Left, decreased stride length, decreased hip/knee flexion- Right, and decreased ankle dorsiflexion- Right Distance walked: various clinic distances Assistive device utilized: None Level of assistance: SBA Comments: Intermittent right knee hyperextension and steppage gait to compensate for foot drop.  FUNCTIONAL TESTS:  5 times sit to stand: 10.75 seconds w/ intermittent LUE support Functional gait assessment: 19/30   PATIENT SURVEYS:  None relevant to CC.  TODAY'S TREATMENT:                                                                                                                              DATE: 12/02/2022  -SciFit x6 minutes using BLE/BUE on level 5.0 for dynamic warmup and LE strengthening -Bilateral leg press at 100 lb 3x8 w/ 1  minute rest between focusing on RLE knee control -Seated lowercase ankle alphabet Initial HEP: Access Code: [email or web address] URL: [email or web address] Date: 12/02/2022 Prepared by: Camille Bal  Exercises - Seated Ankle Alphabet  - 1 x daily - 7 x weekly - 3 sets - 10 reps - Seated Heel Toe Raises  - 1 x daily - 5 x weekly - 3 sets - 4-5 reps - 3 seconds each hold - Ankle Dorsiflexion with Resistance  - 1 x daily - 5 x weekly - 1-2 sets - 10 reps - Staggered Sit-to-Stand  - 1 x daily - 7 x weekly - 2 sets - 10 reps  PATIENT EDUCATION: Education details: Small sips when drinking.  Initial HEP. Person educated: Patient Education method: Explanation Education comprehension: verbalized understanding  HOME EXERCISE PROGRAM: Access Code: [email or web address] URL: [email or web address] Date: 12/02/2022 Prepared by: Camille Bal  Exercises - Seated Ankle Alphabet  - 1 x daily - 7 x weekly - 3 sets - 10 reps - Seated Heel Toe Raises  - 1 x daily - 5 x weekly - 3 sets - 4-5 reps - 3 seconds each hold - Ankle Dorsiflexion with Resistance  -  1 x daily - 5 x weekly - 1-2 sets - 10 reps - Staggered Sit-to-Stand  - 1 x daily - 7 x weekly - 2 sets - 10 reps  GOALS: Goals reviewed with patient? Yes  SHORT TERM GOALS: Target date: 12/27/2022  Pt will be independent with initial strength and balance HEP to address right LE weakness and foot drop and maintain progress from PT. Baseline:  To be established. Goal status: INITIAL  2.  Pt will improve FGA score to >/=23/30 in order to demonstrate improved balance and decreased fall risk. Baseline: 19/30 Goal status: INITIAL  3.  Pt will trial right foot-up brace to assess benefit to safe walking mechanics and managing foot-drop with purchase as desired. Baseline: To be assessed. Goal status: INITIAL  LONG TERM GOALS: Target date: 01/24/2023  Pt will report adherence to walking program x3 days or more per week in order to  maintain BLE strength and to continue practicing safe walking mechanics. Baseline: To be established. Goal status: INITIAL  2.  Pt will improve FGA score to >/=27/30 in order to demonstrate improved balance and decreased fall risk. Baseline: 19/30 Goal status: INITIAL  3.  AFO to be trialed with order placed as appropriate if pt is agreeable and foot-up brace is not a better option. Baseline: To be assessed. Goal status: INITIAL  ASSESSMENT:  CLINICAL IMPRESSION: Session spent initiating HEP to address RLE weakness and foot drop.  Initial part of session focused on addressing quad strength and motor recruitment to protect against RLE hyperextension.  Pt is still not interested in AFO options which PT feels may be more beneficial than foot-up to control both knee and ankle deficits.  Will revisit as able in coming sessions.  OBJECTIVE IMPAIRMENTS: Abnormal gait, decreased balance, decreased coordination, and decreased strength.   ACTIVITY LIMITATIONS: stairs and locomotion level  PARTICIPATION LIMITATIONS: driving and community activity  PERSONAL FACTORS: Behavior pattern, Past/current experiences, Time since onset of injury/illness/exacerbation, and 1-2 comorbidities: Autism-sensory intolerances limiting bracing options, MS  are also affecting patient's functional outcome.   REHAB POTENTIAL: Excellent  CLINICAL DECISION MAKING: Stable/uncomplicated  EVALUATION COMPLEXITY: Low  PLAN:  PT FREQUENCY: 1x/week  PT DURATION: 8 weeks  PLANNED INTERVENTIONS: Therapeutic exercises, Therapeutic activity, Neuromuscular re-education, Balance training, Gait training, Patient/Family education, Self Care, Joint mobilization, Stair training, Vestibular training, Orthotic/Fit training, DME instructions, and Re-evaluation  PLAN FOR NEXT SESSION: Modifiy HEP prn for RLE NMR/strength/foot drop.  Trial foot-up-pt does not want AFO (re-discuss if beneficial).  Establish walking program.  Stair  training? (Pt uses ramp at home, but unsure if permanent install)   Sadie Haber, PT, DPT 12/02/2022, 11:48 AM

## 2022-12-05 DIAGNOSIS — E291 Testicular hypofunction: Secondary | ICD-10-CM | POA: Diagnosis not present

## 2022-12-09 ENCOUNTER — Ambulatory Visit: Payer: PPO | Attending: Neurology | Admitting: Physical Therapy

## 2022-12-09 ENCOUNTER — Ambulatory Visit: Payer: PPO | Admitting: Speech Pathology

## 2022-12-09 ENCOUNTER — Encounter: Payer: Self-pay | Admitting: Speech Pathology

## 2022-12-09 ENCOUNTER — Encounter: Payer: Self-pay | Admitting: Physical Therapy

## 2022-12-09 DIAGNOSIS — Z9181 History of falling: Secondary | ICD-10-CM | POA: Diagnosis not present

## 2022-12-09 DIAGNOSIS — R278 Other lack of coordination: Secondary | ICD-10-CM | POA: Diagnosis not present

## 2022-12-09 DIAGNOSIS — R2681 Unsteadiness on feet: Secondary | ICD-10-CM | POA: Diagnosis not present

## 2022-12-09 DIAGNOSIS — M6281 Muscle weakness (generalized): Secondary | ICD-10-CM | POA: Diagnosis not present

## 2022-12-09 DIAGNOSIS — R1312 Dysphagia, oropharyngeal phase: Secondary | ICD-10-CM | POA: Diagnosis not present

## 2022-12-09 NOTE — Patient Instructions (Signed)
   Love the strategy of taking a sip, keeping head neurtral, and taking the cup away from the mouth in between sips - this is working!!  Trying not to worry about swallowing but keeping your throat relaxed and open can help - when   You can place your meds on the back of your tongue or place the smaller pills on a spoon in some pudding, ice cream or applesauce which can help you maneuver the pill back with the pudding  If the smaller pills get too hard, you can crush them or ask for a liquid form - ask your MD before you crush any meds  You are at such a low risk for any aspiration - definitely no need to worry about that, also they can treat pneumonia with antibiotics so it's not such a big deal   [employer] adaptive clothing, [employer] has adaptive clothing  There are also adaptive items to help you dress - this is where OT can help even if they can't get your hand better

## 2022-12-09 NOTE — Therapy (Deleted)
op

## 2022-12-09 NOTE — Therapy (Signed)
OUTPATIENT SPEECH LANGUAGE PATHOLOGY SWALLOW EVALUATION   Patient Name: George [name] MRN: [id number] DOB:Jul 02, 1971, 52 y.o., male Today's Date: 12/09/2022  PCP: Kirby Funk MD REFERRING PROVIDER: Asa Lente, MD  END OF SESSION:  End of Session - 12/09/22 1112     Visit Number 1    Number of Visits 1    SLP Start Time 1100    SLP Stop Time  1145    SLP Time Calculation (min) 45 min    Activity Tolerance Patient tolerated treatment well             Past Medical History:  Diagnosis Date   Anxiety    Asperger's syndrome    Autistic spectrum disorder    Asperger's (per H&P Olivia Clelland, PA)   Bipolar 1 disorder (HCC)    "no mood swings in 20 years"  on medication   GERD (gastroesophageal reflux disease)    Head injury 2020   Hypertension    Hypothyroidism    IBS (irritable bowel syndrome)    Past Surgical History:  Procedure Laterality Date   NO PAST SURGERIES     RADIOLOGY WITH ANESTHESIA N/A 05/16/2020   Procedure: MRI BRAIN WITH AND WITHOUT CONTRAST;  Surgeon: Radiologist, Medication, MD;  Location: [facility] OR;  Service: Radiology;  Laterality: N/A;   RADIOLOGY WITH ANESTHESIA N/A 08/29/2020   Procedure: MRI WITH ANESTHESIA CERVICAL SPINE WITH AND WITHOUT CONTRAST, THORACIC SPINE WITH AND WITHOUT CONTRAST;  Surgeon: Radiologist, Medication, MD;  Location: [facility];  Service: Radiology;  Laterality: N/A;   RADIOLOGY WITH ANESTHESIA N/A 03/15/2021   Procedure: MRI BRAIN WITH AND WITHOUT, CERIVAL WITH AND WITHOUT;  Surgeon: Radiologist, Medication, MD;  Location: [facility] OR;  Service: Radiology;  Laterality: N/A;   Patient Active Problem List   Diagnosis Date Noted   Bipolar disease, chronic (HCC) 06/20/2021   Slurred speech 02/26/2021   Autism 12/20/2020   Multiple sclerosis (HCC) 10/11/2020   MRI of brain abnormal 10/11/2020   Right sided weakness 10/11/2020   Gait disturbance 10/11/2020   Asperger syndrome 03/19/2013    ONSET DATE: 11/27/22  (referral  date)  REFERRING DIAG:  Diagnosis  R13.10 (ICD-10-CM) - Dysphagia, unspecified type    THERAPY DIAG:  Dysphagia, oropharyngeal phase  Rationale for Evaluation and Treatment: Rehabilitation  SUBJECTIVE:   SUBJECTIVE STATEMENT: "I figured it out" re: swallow Pt accompanied by: self  PERTINENT HISTORY: George [name] is well know to [place] from prior courses of ST for dysarthria and dysphagia. Last seen April through May 2022.   PAIN:  Are you having pain? No  FALLS: Has patient fallen in last 6 months?  See PT evaluation for details  LIVING ENVIRONMENT: Lives with: lives alone Lives in: House/apartment  PLOF:  Level of assistance: Independent with ADLs, Independent with IADLs Employment: On disability  PATIENT GOALS: To not get pneumonia  OBJECTIVE:   DIAGNOSTIC FINDINGS:   RECOMMENDATIONS FROM OBJECTIVE SWALLOW STUDY (MBSS/FEES):  MBSS 06/27/21 Objective swallow impairments: Essentially WNL swallow with functional component due to anxiety around swallowing Objective recommended compensations: Regular/thin, slow rate, small sips  COGNITION: Overall cognitive status: Within functional limits for tasks assessed Areas of impairment:    ORAL MOTOR EXAMINATION: Overall status: WFL Comments:   CLINICAL SWALLOW ASSESSMENT:   Current diet: regular and thin liquids Dentition: adequate natural dentition Patient directly observed with POs: Yes: thin liquids  Feeding: able to feed self Liquids provided by: cup Oral phase signs and symptoms:  WNL Pharyngeal phase signs and symptoms:  WNL   TODAY'S TREATMENT:                                                                                                                                         DATE: Reviewed prior swallow precautions of keeping head neutral and taking small sips. George [name] demonstrated this 12/12 sips without overt s/s of aspiration or dysphagia. George [name] benefits from taking the cup away from his mouth in between  sips as a cue to not extend neck and gulp. George [name] that George [name] is at such a low risk of getting aspiration pna due to overall health, mobility and WNL cognition, that George [name] needn't worry about this at this time. Rapid sips keeping cup up at his mouth were also WNL,however George [name] reported George [name] started to "worry about swallowing" and "tensed his throat" so George [name] felt like George [name] was going to choke. This is c/w prior courses of ST. We discussed strategies to manage small pills - see clinical Impression.  PATIENT EDUCATION: Education details: See today's treatment and patient instructions Person George: Patient Education method: Explanation, Demonstration, Verbal cues, and Handouts Education comprehension: verbalized understanding and returned demonstration   ASSESSMENT:  CLINICAL IMPRESSION: Patient is a 52 y.o. male who was seen today for dysphagia - known to [place] from prior courses of ST in 2022. Today, George [name] feels like George [name] is swallowing better than when George [name] requested this referral. George [name] c/o occasional difficulty "getting choked" on liquids only. George [name] denies difficulty consuming solids. George [name] reports that when George [name] extends his neck and gulps rapidly that George [name] gets choked. George [name] also ID's anxiety and worry re: getting choked which results in him tensing his neck and throat while swallowing which then results in coughing. George [name] has developed a successful strategy of taking the cup away from his mouth in between sips and keeping his head and neck neutral. George [name] is swallowing larger pills fine, however George [name] does have difficulty with A-P transfer of small pills such as synthroid. We generated strategies of placing the pill on the back of his tongue or placing smaller pills in a spoon of applesauce or pudding to allow for easier transfer to posterior tongue. George [name] is also aware George [name] can ask his physician about crushing some meds or getting a liquid form.  At this time, George [name] is managing liquids well, and swallow is c/w prior courses of ST. I  do not recommend skilled ST at this time, Pilar is in agreement.  OBJECTIVE IMPAIRMENTS: include dysphagia. These impairments are limiting patient from  N/A . Factors affecting potential to achieve goals and functional outcome are co-morbidities. Patient will benefit from skilled SLP services to address above impairments and improve overall function.  REHAB POTENTIAL: Good   PLAN: No f/u with ST at this time    Dara Hoyer, CCC-SLP 12/09/2022, 11:59 AM

## 2022-12-09 NOTE — Therapy (Signed)
OUTPATIENT PHYSICAL THERAPY NEURO TREATMENT   Patient Name: George [name] MRN: [id number] DOB:August 16, 1970, 52 y.o., male Today's Date: 12/09/2022   PCP: Kirby Funk, MD REFERRING PROVIDER: Asa Lente, MD  END OF SESSION:  PT End of Session - 12/09/22 1024     Visit Number 3    Number of Visits 9   8 + eval   Date for PT Re-Evaluation 01/31/23   pushed out due to scheduling preferences of patient   Authorization Type [employer]    PT Start Time 1020    PT Stop Time 1100    PT Time Calculation (min) 40 min    Equipment Utilized During Treatment Gait belt    Activity Tolerance Patient tolerated treatment well    Behavior During Therapy Impulsive            Past Medical History:  Diagnosis Date   Anxiety    Asperger's syndrome    Autistic spectrum disorder    Asperger's (per H&P Olivia Clelland, PA)   Bipolar 1 disorder (HCC)    "no mood swings in 20 years"  on medication   GERD (gastroesophageal reflux disease)    Head injury 2020   Hypertension    Hypothyroidism    IBS (irritable bowel syndrome)    Past Surgical History:  Procedure Laterality Date   NO PAST SURGERIES     RADIOLOGY WITH ANESTHESIA N/A 05/16/2020   Procedure: MRI BRAIN WITH AND WITHOUT CONTRAST;  Surgeon: Radiologist, Medication, MD;  Location: [facility] OR;  Service: Radiology;  Laterality: N/A;   RADIOLOGY WITH ANESTHESIA N/A 08/29/2020   Procedure: MRI WITH ANESTHESIA CERVICAL SPINE WITH AND WITHOUT CONTRAST, THORACIC SPINE WITH AND WITHOUT CONTRAST;  Surgeon: Radiologist, Medication, MD;  Location: [facility];  Service: Radiology;  Laterality: N/A;   RADIOLOGY WITH ANESTHESIA N/A 03/15/2021   Procedure: MRI BRAIN WITH AND WITHOUT, CERIVAL WITH AND WITHOUT;  Surgeon: Radiologist, Medication, MD;  Location: [facility] OR;  Service: Radiology;  Laterality: N/A;   Patient Active Problem List   Diagnosis Date Noted   Bipolar disease, chronic (HCC) 06/20/2021   Slurred speech 02/26/2021   Autism  12/20/2020   Multiple sclerosis (HCC) 10/11/2020   MRI of brain abnormal 10/11/2020   Right sided weakness 10/11/2020   Gait disturbance 10/11/2020   Asperger syndrome 03/19/2013    ONSET DATE: 11/18/2022 (referral date)  REFERRING DIAG: G35 (ICD-10-CM) - Multiple sclerosis R26.9 (ICD-10-CM) - Gait disturbance R53.1 (ICD-10-CM) - Right sided weakness R53.1 (ICD-10-CM) - Weakness  THERAPY DIAG:  Other lack of coordination  Muscle weakness (generalized)  Unsteadiness on feet  History of falling  Rationale for Evaluation and Treatment: Rehabilitation  SUBJECTIVE:  SUBJECTIVE STATEMENT: Pt remains concerned about right lower flank pain described as muscular strain.  He denies falls or pain at current. Pt accompanied by: self - he drives himself  PERTINENT HISTORY: Autism, MS, Bipolar 1, HTN, head injury 2020  FROM MD NOTE:  "MS HISTORY: He has a h/o Asperger syndrome and bipolar disease.  In 2012, he had an MRI of the brain and was found to have multiple T2/FLAIR hyperintense foci in the brain.  A lumbar puncture at the time was negative.  Over the last year, he began to have more difficulty with right hand coordination and left foot drop.  Also the right side of his face was drooping some.  He came in to see Dr. Marjory Lies and had repeat imaging studies showing progression of the white matter foci and the appearance of a small focus at C5.  Repeat LP, however, showed negative CSF (no OCB) again.  He started glatiramer 10/2020."  PAIN:  Are you having pain? No - some low back pain lingering intermittently from when he was volunteering.  PRECAUTIONS: Fall  WEIGHT BEARING RESTRICTIONS: No  FALLS: Has patient fallen in last 6 months? No  LIVING ENVIRONMENT: Lives with: lives alone Lives in:  House/apartment Stairs: Yes: External: 2 steps; none Has following equipment at home: Grab bars and Ramped entry-he uses ramp w/o issue  PLOF: Independent  PATIENT GOALS: "to strengthen my ankles and for my throat" - PT to obtain ST referral.  OBJECTIVE:   DIAGNOSTIC FINDINGS: Imaging studies reviewed: MRI of the brain 11/21/2010 shows multiple T2/FLAIR hyperintense foci including a focus in the left pons, middle cerebellar peduncle, right thalamus.  Some foci of periventricular and some foci are juxtacortical.  None of the foci enhanced.   MRI of the brain 05/16/2020 showed a similar pattern at the 2012 MRI but there has been mild progression with at least 2 new lesions noted in the white matter compared to the previous MRI.  As seen before there is a large focus involving the left greater than right pons.  It may be slightly larger.  There is also involvement of the middle cerebellar peduncle which is stable on the right but a new focus on the left.  There is one stable focus in the right thalamus.   MRI of the cervical spine 08/29/2020 showed a small focus posteriorly to the right adjacent to C5.  Mild DJD at C5-C6 not causing nerve root compression.   MRI of the thoracic spine 08/29/2020 showed a normal spinal cord (subtle focus at T10-T11 more likely artifact).  There are cystic lesions within the kidneys   MRI of the brain 03/15/2021 and it was unchanged.   No new foci. IMPRESSION: Multiple supratentorial and infratentorial T2 hyperintense lesions consistent with the clinical diagnosis of multiple sclerosis. No new or enhancing lesion identified.   MRI of the cervical spine 03/15/2021 showed a focus to the right at [facility].  COGNITION: Overall cognitive status: Within functional limits for tasks assessed   SENSATION: Light touch: WFL  COORDINATION: RLE more dysmetric than LLE noted on LE RAMS and heel-to-shin assessments.  EDEMA:  None noted in BLE  MUSCLE TONE: None noted in BLE  during functional tasks.  POSTURE: No Significant postural limitations  LOWER EXTREMITY ROM:     Active  Right Eval Left Eval  Hip flexion Grossly [facility]  Hip extension   Hip abduction   Hip adduction   Hip internal rotation   Hip external rotation   Knee flexion  Knee extension   Ankle dorsiflexion   Ankle plantarflexion   Ankle inversion    Ankle eversion     (Blank rows = not tested)  LOWER EXTREMITY MMT:    MMT Right Eval Left Eval  Hip flexion 4/5 4+/5  Hip extension    Hip abduction    Hip adduction    Hip internal rotation    Hip external rotation    Knee flexion    Knee extension 4+/5 5/5  Ankle dorsiflexion 3+/5 4+/5  Ankle plantarflexion    Ankle inversion    Ankle eversion    (Blank rows = not tested)  BED MOBILITY:  Sit to supine Complete Independence Supine to sit Complete Independence  TRANSFERS: Assistive device utilized: None  Sit to stand: Complete Independence Stand to sit: Complete Independence Chair to chair: Complete Independence  GAIT: Gait pattern: decreased arm swing- Right, decreased arm swing- Left, decreased stride length, decreased hip/knee flexion- Right, and decreased ankle dorsiflexion- Right Distance walked: various clinic distances Assistive device utilized: None Level of assistance: SBA Comments: Intermittent right knee hyperextension and steppage gait to compensate for foot drop.  FUNCTIONAL TESTS:  5 times sit to stand: 10.75 seconds w/ intermittent LUE support Functional gait assessment: 19/30   PATIENT SURVEYS:  None relevant to CC.  TODAY'S TREATMENT:                                                                                                                              DATE: 12/09/2022  -Discussed possible contributions to flank discomfort-pt thinks could be from lifting overhead weights w/ some trunk compensation, based on demo PT agrees.  Discussed correct form to prevent injury as well as backing down on  the resistance.  Encouraged pt to try getting back into gym routine slowly, continue light aerobic tasks and include stretching in routine. -SciFit x8 minutes using BLE/BUE on level 3.0-5.0 for dynamic cardiovascular warmup and LE strengthening -Bilateral leg press 3x12 at 100 lbs, edu on form and eccentric control -Heel taps LLE only for right eccentric quad control 2x10 w/ 1 minute rest b/w sets.  Pt has notable right quad fatigue w/ buckle during ambulation to mat w/o fall.  Monitored pt during rest w/ good recovery noted on ambulation to main lobby.  PATIENT EDUCATION: Education details:  Continue HEP and return to gym slowly as able to maintain activity.  Recommended not doing strenuous activity today for muscular recovery from PT. Person educated: Patient Education method: Explanation Education comprehension: verbalized understanding  HOME EXERCISE PROGRAM: Access Code: [email or web address] URL: [email or web address] Date: 12/02/2022 Prepared by: Camille Bal  Exercises - Seated Ankle Alphabet  - 1 x daily - 7 x weekly - 3 sets - 10 reps - Seated Heel Toe Raises  - 1 x daily - 5 x weekly - 3 sets - 4-5 reps - 3 seconds each hold - Ankle Dorsiflexion with Resistance  - 1 x daily - 5 x weekly -  1-2 sets - 10 reps - Staggered Sit-to-Stand  - 1 x daily - 7 x weekly - 2 sets - 10 reps  GOALS: Goals reviewed with patient? Yes  SHORT TERM GOALS: Target date: 12/27/2022  Pt will be independent with initial strength and balance HEP to address right LE weakness and foot drop and maintain progress from PT. Baseline:  To be established. Goal status: INITIAL  2.  Pt will improve FGA score to >/=23/30 in order to demonstrate improved balance and decreased fall risk. Baseline: 19/30 Goal status: INITIAL  3.  Pt will trial right foot-up brace to assess benefit to safe walking mechanics and managing foot-drop with purchase as desired. Baseline: To be assessed. Goal status:  INITIAL  LONG TERM GOALS: Target date: 01/24/2023  Pt will report adherence to walking program x3 days or more per week in order to maintain BLE strength and to continue practicing safe walking mechanics. Baseline: To be established. Goal status: INITIAL  2.  Pt will improve FGA score to >/=27/30 in order to demonstrate improved balance and decreased fall risk. Baseline: 19/30 Goal status: INITIAL  3.  AFO to be trialed with order placed as appropriate if pt is agreeable and foot-up brace is not a better option. Baseline: To be assessed. Goal status: INITIAL  ASSESSMENT:  CLINICAL IMPRESSION: Emphasis of skilled session focused on therapeutic exercise to address right quad weakness and motor recruitment.  Pt has extreme fatigue of quad at end of session with good recovery following monitored rest.  Will continue per POC.  OBJECTIVE IMPAIRMENTS: Abnormal gait, decreased balance, decreased coordination, and decreased strength.   ACTIVITY LIMITATIONS: stairs and locomotion level  PARTICIPATION LIMITATIONS: driving and community activity  PERSONAL FACTORS: Behavior pattern, Past/current experiences, Time since onset of injury/illness/exacerbation, and 1-2 comorbidities: Autism-sensory intolerances limiting bracing options, MS  are also affecting patient's functional outcome.   REHAB POTENTIAL: Excellent  CLINICAL DECISION MAKING: Stable/uncomplicated  EVALUATION COMPLEXITY: Low  PLAN:  PT FREQUENCY: 1x/week  PT DURATION: 8 weeks  PLANNED INTERVENTIONS: Therapeutic exercises, Therapeutic activity, Neuromuscular re-education, Balance training, Gait training, Patient/Family education, Self Care, Joint mobilization, Stair training, Vestibular training, Orthotic/Fit training, DME instructions, and Re-evaluation  PLAN FOR NEXT SESSION: Modifiy HEP prn for RLE NMR/strength/foot drop.  Trial foot-up-pt does not want AFO (re-discuss if beneficial).  Establish walking program.  Stair  training? (Pt uses ramp at home, but unsure if permanent install)   Sadie Haber, PT, DPT 12/09/2022, 10:59 AM

## 2022-12-11 DIAGNOSIS — F319 Bipolar disorder, unspecified: Secondary | ICD-10-CM | POA: Diagnosis not present

## 2022-12-16 ENCOUNTER — Ambulatory Visit: Payer: PPO | Admitting: Physical Therapy

## 2022-12-16 ENCOUNTER — Encounter: Payer: Self-pay | Admitting: Physical Therapy

## 2022-12-16 ENCOUNTER — Other Ambulatory Visit: Payer: Self-pay | Admitting: Neurology

## 2022-12-16 ENCOUNTER — Telehealth: Payer: Self-pay | Admitting: Physical Therapy

## 2022-12-16 DIAGNOSIS — R278 Other lack of coordination: Secondary | ICD-10-CM

## 2022-12-16 DIAGNOSIS — R531 Weakness: Secondary | ICD-10-CM

## 2022-12-16 DIAGNOSIS — M6281 Muscle weakness (generalized): Secondary | ICD-10-CM

## 2022-12-16 DIAGNOSIS — R2681 Unsteadiness on feet: Secondary | ICD-10-CM

## 2022-12-16 DIAGNOSIS — Z9181 History of falling: Secondary | ICD-10-CM

## 2022-12-16 DIAGNOSIS — G35 Multiple sclerosis: Secondary | ICD-10-CM

## 2022-12-16 NOTE — Therapy (Signed)
OUTPATIENT PHYSICAL THERAPY NEURO TREATMENT   Patient Name: George [name] MRN: [id number] DOB:12-07-1970, 52 y.o., male Today's Date: 12/16/2022   PCP: Kirby Funk, MD REFERRING PROVIDER: Asa Lente, MD  END OF SESSION:  PT End of Session - 12/16/22 1103     Visit Number 4    Number of Visits 9   8 + eval   Date for PT Re-Evaluation 01/31/23   pushed out due to scheduling preferences of patient   Authorization Type [employer]    PT Start Time 1100    PT Stop Time 1144    PT Time Calculation (min) 44 min    Equipment Utilized During Treatment Gait belt    Activity Tolerance Patient tolerated treatment well    Behavior During Therapy Impulsive            Past Medical History:  Diagnosis Date   Anxiety    Asperger's syndrome    Autistic spectrum disorder    Asperger's (per H&P Olivia Clelland, PA)   Bipolar 1 disorder (HCC)    "no mood swings in 20 years"  on medication   GERD (gastroesophageal reflux disease)    Head injury 2020   Hypertension    Hypothyroidism    IBS (irritable bowel syndrome)    Past Surgical History:  Procedure Laterality Date   NO PAST SURGERIES     RADIOLOGY WITH ANESTHESIA N/A 05/16/2020   Procedure: MRI BRAIN WITH AND WITHOUT CONTRAST;  Surgeon: Radiologist, Medication, MD;  Location: [facility] OR;  Service: Radiology;  Laterality: N/A;   RADIOLOGY WITH ANESTHESIA N/A 08/29/2020   Procedure: MRI WITH ANESTHESIA CERVICAL SPINE WITH AND WITHOUT CONTRAST, THORACIC SPINE WITH AND WITHOUT CONTRAST;  Surgeon: Radiologist, Medication, MD;  Location: [facility] OR;  Service: Radiology;  Laterality: N/A;   RADIOLOGY WITH ANESTHESIA N/A 03/15/2021   Procedure: MRI BRAIN WITH AND WITHOUT, CERIVAL WITH AND WITHOUT;  Surgeon: Radiologist, Medication, MD;  Location: [facility] OR;  Service: Radiology;  Laterality: N/A;   Patient Active Problem List   Diagnosis Date Noted   Bipolar disease, chronic (HCC) 06/20/2021   Slurred speech 02/26/2021   Autism  12/20/2020   Multiple sclerosis (HCC) 10/11/2020   MRI of brain abnormal 10/11/2020   Right sided weakness 10/11/2020   Gait disturbance 10/11/2020   Asperger syndrome 03/19/2013    ONSET DATE: 11/18/2022 (referral date)  REFERRING DIAG: G35 (ICD-10-CM) - Multiple sclerosis R26.9 (ICD-10-CM) - Gait disturbance R53.1 (ICD-10-CM) - Right sided weakness R53.1 (ICD-10-CM) - Weakness  THERAPY DIAG:  Other lack of coordination  Muscle weakness (generalized)  Unsteadiness on feet  History of falling  Rationale for Evaluation and Treatment: Rehabilitation  SUBJECTIVE:  SUBJECTIVE STATEMENT: He reports some near falls due to drop foot and catching toes.  He reports he has not been working out as frequently due to feeling depressed.  He states he is seeing his therapist once a week and is part of an Autism support group and does not feel he needs other resources at this moment.  He has painted a little to help himself feel better.  His flank and back are feeling better. Pt accompanied by: self - he drives himself  PERTINENT HISTORY: Autism, MS, Bipolar 1, HTN, head injury 2020  FROM MD NOTE:  "MS HISTORY: He has a h/o Asperger syndrome and bipolar disease.  In 2012, he had an MRI of the brain and was found to have multiple T2/FLAIR hyperintense foci in the brain.  A lumbar puncture at the time was negative.  Over the last year, he began to have more difficulty with right hand coordination and left foot drop.  Also the right side of his face was drooping some.  He came in to see Dr. Marjory Lies and had repeat imaging studies showing progression of the white matter foci and the appearance of a small focus at C5.  Repeat LP, however, showed negative CSF (no OCB) again.  He started glatiramer 10/2020."  PAIN:  Are  you having pain? No  PRECAUTIONS: Fall  WEIGHT BEARING RESTRICTIONS: No  FALLS: Has patient fallen in last 6 months? No  LIVING ENVIRONMENT: Lives with: lives alone Lives in: House/apartment Stairs: Yes: External: 2 steps; none Has following equipment at home: Grab bars and Ramped entry-he uses ramp w/o issue  PLOF: Independent  PATIENT GOALS: "to strengthen my ankles and for my throat" - PT to obtain ST referral.  OBJECTIVE:   DIAGNOSTIC FINDINGS: Imaging studies reviewed: MRI of the brain 11/21/2010 shows multiple T2/FLAIR hyperintense foci including a focus in the left pons, middle cerebellar peduncle, right thalamus.  Some foci of periventricular and some foci are juxtacortical.  None of the foci enhanced.   MRI of the brain 05/16/2020 showed a similar pattern at the 2012 MRI but there has been mild progression with at least 2 new lesions noted in the white matter compared to the previous MRI.  As seen before there is a large focus involving the left greater than right pons.  It may be slightly larger.  There is also involvement of the middle cerebellar peduncle which is stable on the right but a new focus on the left.  There is one stable focus in the right thalamus.   MRI of the cervical spine 08/29/2020 showed a small focus posteriorly to the right adjacent to C5.  Mild DJD at C5-C6 not causing nerve root compression.   MRI of the thoracic spine 08/29/2020 showed a normal spinal cord (subtle focus at T10-T11 more likely artifact).  There are cystic lesions within the kidneys   MRI of the brain 03/15/2021 and it was unchanged.   No new foci. IMPRESSION: Multiple supratentorial and infratentorial T2 hyperintense lesions consistent with the clinical diagnosis of multiple sclerosis. No new or enhancing lesion identified.   MRI of the cervical spine 03/15/2021 showed a focus to the right at [facility].  COGNITION: Overall cognitive status: Within functional limits for tasks  assessed   SENSATION: Light touch: WFL  COORDINATION: RLE more dysmetric than LLE noted on LE RAMS and heel-to-shin assessments.  EDEMA:  None noted in BLE  MUSCLE TONE: None noted in BLE during functional tasks.  POSTURE: No Significant postural limitations  LOWER EXTREMITY ROM:     Active  Right Eval Left Eval  Hip flexion Grossly WFL  Hip extension   Hip abduction   Hip adduction   Hip internal rotation   Hip external rotation   Knee flexion   Knee extension   Ankle dorsiflexion   Ankle plantarflexion   Ankle inversion    Ankle eversion     (Blank rows = not tested)  LOWER EXTREMITY MMT:    MMT Right Eval Left Eval  Hip flexion 4/5 4+/5  Hip extension    Hip abduction    Hip adduction    Hip internal rotation    Hip external rotation    Knee flexion    Knee extension 4+/5 5/5  Ankle dorsiflexion 3+/5 4+/5  Ankle plantarflexion    Ankle inversion    Ankle eversion    (Blank rows = not tested)  BED MOBILITY:  Sit to supine Complete Independence Supine to sit Complete Independence  TRANSFERS: Assistive device utilized: None  Sit to stand: Complete Independence Stand to sit: Complete Independence Chair to chair: Complete Independence  GAIT: Gait pattern: decreased arm swing- Right, decreased arm swing- Left, decreased stride length, decreased hip/knee flexion- Right, and decreased ankle dorsiflexion- Right Distance walked: various clinic distances Assistive device utilized: None Level of assistance: SBA Comments: Intermittent right knee hyperextension and steppage gait to compensate for foot drop.  FUNCTIONAL TESTS:  5 times sit to stand: 10.75 seconds w/ intermittent LUE support Functional gait assessment: 19/30   PATIENT SURVEYS:  None relevant to CC.  TODAY'S TREATMENT:                                                                                                                              DATE: 12/16/2022  -SciFit warmup on level  4.0 x 8 minutes using BLE/BUE for dynamic strengthening and aerobic endurance.  RPE 4-5/10. -Squats w/ mat touch 2x10 -Lunges w/ unilateral UE support x10 each LE -Bilateral leg press at 100lb 3x10, pt using improved form today w/ cues to reduce speed of concentric activation  STAIRS:  Level of Assistance: SBA and CGA  Stair Negotiation Technique: Alternating Pattern  Forwards with No Rails Single Rail on Left Bilateral Rails  Number of Stairs: 3x4   Height of Stairs: 6"  Comments: Recommended use of rail as able, pt able to perform stairs reciprocally in all conditions w/ increased R knee bend on descent without rails.  Edu on using step to w/ weaker LE (right LE) leading on descent to modify the difficulty and increase safety of this task if rail is absent.  PATIENT EDUCATION: Education details:  Referral for OT.  Stair safety.  Continue HEP w/ additions and return to gym slowly as able to maintain activity.  Discussed ongoing benefit of bracing options and limitations to foot-up (no lace options for shoes) vs AFO for safety with long and unlevel distances as pt has 2 episodes of toe  catches w/ independent recovery this session. Person educated: Patient Education method: Explanation Education comprehension: verbalized understanding  HOME EXERCISE PROGRAM: Access Code: [email or web address] URL: [email or web address] Date: 12/02/2022 Prepared by: Camille Bal  Exercises - Seated Ankle Alphabet  - 1 x daily - 7 x weekly - 3 sets - 10 reps - Seated Heel Toe Raises  - 1 x daily - 5 x weekly - 3 sets - 4-5 reps - 3 seconds each hold - Ankle Dorsiflexion with Resistance  - 1 x daily - 5 x weekly - 1-2 sets - 10 reps - Staggered Sit-to-Stand  - 1 x daily - 7 x weekly - 2 sets - 10 reps - Squat with Chair Touch  - 1 x daily - 7 x weekly - 2 sets - 10 reps - Lunge with Counter Support  - 1 x daily - 7 x weekly - 2 sets - 10 reps  GOALS: Goals reviewed with patient? Yes  SHORT  TERM GOALS: Target date: 12/27/2022  Pt will be independent with initial strength and balance HEP to address right LE weakness and foot drop and maintain progress from PT. Baseline:  To be established. Goal status: INITIAL  2.  Pt will improve FGA score to >/=23/30 in order to demonstrate improved balance and decreased fall risk. Baseline: 19/30 Goal status: INITIAL  3.  Pt will trial right foot-up brace to assess benefit to safe walking mechanics and managing foot-drop with purchase as desired. Baseline: To be assessed. Goal status: INITIAL  LONG TERM GOALS: Target date: 01/24/2023  Pt will report adherence to walking program x3 days or more per week in order to maintain BLE strength and to continue practicing safe walking mechanics. Baseline: To be established. Goal status: INITIAL  2.  Pt will improve FGA score to >/=27/30 in order to demonstrate improved balance and decreased fall risk. Baseline: 19/30 Goal status: INITIAL  3.  AFO to be trialed with order placed as appropriate if pt is agreeable and foot-up brace is not a better option. Baseline: To be assessed. Goal status: INITIAL  ASSESSMENT:  CLINICAL IMPRESSION: Focus of skilled session remains on addressing right LE NMR to promote improved control and safety during dynamic tasks.  PT continues to work on addressing rapport with pt to encourage bracing options.  Foot-up may not be viable option due to foot-wear without laces with patient having no other options of shoes.  AFO may not be tolerated due to sensation preferences, but pt reports more openness to option this visit.  He remains pleasant and motivated towards goals and PT will continue working to address deficits to maintain pt independence.  OBJECTIVE IMPAIRMENTS: Abnormal gait, decreased balance, decreased coordination, and decreased strength.   ACTIVITY LIMITATIONS: stairs and locomotion level  PARTICIPATION LIMITATIONS: driving and community  activity  PERSONAL FACTORS: Behavior pattern, Past/current experiences, Time since onset of injury/illness/exacerbation, and 1-2 comorbidities: Autism-sensory intolerances limiting bracing options, MS  are also affecting patient's functional outcome.   REHAB POTENTIAL: Excellent  CLINICAL DECISION MAKING: Stable/uncomplicated  EVALUATION COMPLEXITY: Low  PLAN:  PT FREQUENCY: 1x/week  PT DURATION: 8 weeks  PLANNED INTERVENTIONS: Therapeutic exercises, Therapeutic activity, Neuromuscular re-education, Balance training, Gait training, Patient/Family education, Self Care, Joint mobilization, Stair training, Vestibular training, Orthotic/Fit training, DME instructions, and Re-evaluation  PLAN FOR NEXT SESSION: ASSESS STGs!  Modifiy HEP prn for RLE NMR/strength/foot drop.  Pt open to trialing AFO for grass and long distances.  Establish walking program.   Sadie Haber, PT, DPT 12/16/2022, 11:44  AM     

## 2022-12-16 NOTE — Telephone Encounter (Signed)
Dr. Epimenio Foot, Mykael Depena was evaluated by physical therapy on 11/26/2022.  The patient would benefit from OT evaluation for right hand fine motor deficits.   If you agree, please place an order in [facility] in [facility] or fax the order to [phone]. Thank you, Camille Bal, PT, DPT  [facility] [address] [place], [place]  [address] Phone:  [phone] Fax:  [phone]

## 2022-12-16 NOTE — Patient Instructions (Signed)
Access Code: [email or web address] URL: [email or web address] Date: 12/16/2022 Prepared by: Camille Bal  Exercises - Seated Ankle Alphabet  - 1 x daily - 7 x weekly - 3 sets - 10 reps - Seated Heel Toe Raises  - 1 x daily - 5 x weekly - 3 sets - 4-5 reps - 3 seconds each hold - Ankle Dorsiflexion with Resistance  - 1 x daily - 5 x weekly - 1-2 sets - 10 reps - Staggered Sit-to-Stand  - 1 x daily - 7 x weekly - 2 sets - 10 reps - Squat with Chair Touch  - 1 x daily - 7 x weekly - 2 sets - 10 reps - Lunge with Counter Support  - 1 x daily - 7 x weekly - 2 sets - 10 reps

## 2022-12-17 ENCOUNTER — Ambulatory Visit: Payer: PPO | Admitting: Neurology

## 2022-12-17 ENCOUNTER — Encounter: Payer: Self-pay | Admitting: Neurology

## 2022-12-17 VITALS — BP 132/93 | HR 79 | Ht 67.0 in | Wt 161.2 lb

## 2022-12-17 DIAGNOSIS — R531 Weakness: Secondary | ICD-10-CM | POA: Diagnosis not present

## 2022-12-17 DIAGNOSIS — R269 Unspecified abnormalities of gait and mobility: Secondary | ICD-10-CM | POA: Diagnosis not present

## 2022-12-17 DIAGNOSIS — G35 Multiple sclerosis: Secondary | ICD-10-CM | POA: Diagnosis not present

## 2022-12-17 DIAGNOSIS — R1310 Dysphagia, unspecified: Secondary | ICD-10-CM | POA: Diagnosis not present

## 2022-12-17 DIAGNOSIS — F840 Autistic disorder: Secondary | ICD-10-CM

## 2022-12-17 DIAGNOSIS — F319 Bipolar disorder, unspecified: Secondary | ICD-10-CM | POA: Diagnosis not present

## 2022-12-17 NOTE — Progress Notes (Signed)
[facility]  PATIENT: George [name] DOB: 03-09-1971  REFERRING DOCTOR OR PCP: PCP is Kirby Funk.  Patient has seen Dr. Marjory Lies SOURCE: Patient, mother, notes from Dr. Marjory Lies, MRI imaging reports and lab reports.  MRI images personally reviewed  _________________________________   HISTORICAL  CHIEF COMPLAINT:  Chief Complaint  Patient presents with   Follow-up    Pt in room 10. Here for MS follow up. Pt reports doing well, no recent falls.    HISTORY OF PRESENT ILLNESS:  Mr. Falke is a 52 y.o. man with relapsing remitting multiple sclerosis.  Update  12/17/2022 He is on glatiramer and tolerates it well.    No exacerbation  Last MRI 03/15/2021 showed no new lesions.    Since last visit, he has done some physical and speech therapy.  Swallow evaluation was ordered due to choking on food and drink.  This had improved.  No severe problem noted on the swallow study.  He will be seeing occupational therapy as he notes right hand dysfunction  His gait is mildly unbalanced .   He stumbles but has no falls over the last few weeks.  He has trouble on stairs.  Sometimes the right foot will drag when he walks.  He dhas mild right hand numbness and clumsiness.   Vision is fine.  Bladder function is usually good though he has some urgency with use of caffeine.     He sees speech therapy for swallow function.   The states they feel swallow issues are more psychologic than due to MS.    He is active and tries to exercise daily.    He has some fatigue but no more than last year.     He is sleeping well most nights.    He has bipolar disease and is currently on Depakote.   He has mood swings and is quick to get angry.      He takes Vit D and B12 supplements.    MS HISTORY: He has a h/o Asperger syndrome and bipolar disease.   In 2012, he had an MRI of the brain and was found to have multiple T2/FLAIR hyperintense foci in the brain.   A lumbar puncture at the time was  negative.    Over the last year, he began to have more difficulty with right hand coordination and left foot drop.    Also the right side of his face was drooping some.     He came in to see Dr. Marjory Lies and had repeat imaging studies showing progression of the white matter foci and the appearance of a small focus at C5.    Repeat LP, however, showed negative CSF (no OCB) again.   He started glatiramer 10/2020.    OTHER MEDICAL HISTORY He has bipolar disease (sees Dr. Evelene Croon).  He also has autism.   He is on Depakote.   He notes being more emotional and easily gets overwhelmed with emotions.   This had happened when he was younger but then got better again.      He has irritable bowel syndrome and takes B12 since he was told absorption may be affected.   B12 level was fine.     Imaging studies reviewed: MRI of the brain 11/21/2010 shows multiple T2/FLAIR hyperintense foci including a focus in the left pons, middle cerebellar peduncle, right thalamus.  Some foci of periventricular and some foci are juxtacortical.  None of the foci enhanced.  MRI of the brain 05/16/2020  showed a similar pattern at the 2012 MRI but there has been mild progression with at least 2 new lesions noted in the white matter compared to the previous MRI.  As seen before there is a large focus involving the left greater than right pons.  It may be slightly larger.  There is also involvement of the middle cerebellar peduncle which is stable on the right but a new focus on the left.  There is one stable focus in the right thalamus.  MRI of the cervical spine 08/29/2020 showed a small focus posteriorly to the right adjacent to C5.  Mild DJD at C5-C6 not causing nerve root compression.  MRI of the thoracic spine 08/29/2020 showed a normal spinal cord (subtle focus at T10-T11 more likely artifact).  There are cystic lesions within the kidneys  MRI of the brain 03/15/2021 and it was unchanged.   No new foci.  MRI of the cervical spine  03/15/2021 showed a focus to the right at [facility].  Laboratory results. ANA, ANCA, HIV, hep B antibodies, hep C antibody were all negative or normal.  Vitamin D was mildly low.  Vitamin B12 was fine (elevated), creatinine was mildly elevated.  JCV antibody was positive at 1.3.  There were no oligoclonal bands in the CSF.  Protein was mildly elevated at 54.  REVIEW OF SYSTEMS: Constitutional: No fevers, chills, sweats, or change in appetite Eyes: No visual changes, double vision, eye pain Ear, nose and throat: No hearing loss, ear pain, nasal congestion, sore throat Cardiovascular: No chest pain, palpitations Respiratory:  No shortness of breath at rest or with exertion.   No wheezes GastrointestinaI: No nausea, vomiting, diarrhea, abdominal pain, fecal incontinence Genitourinary:  No dysuria, urinary retention or frequency.  No nocturia. Musculoskeletal:  No neck pain, back pain Integumentary: No rash, pruritus, skin lesions Neurological: as above Psychiatric: No depression at this time.  No anxiety Endocrine: No palpitations, diaphoresis, change in appetite, change in weigh or increased thirst Hematologic/Lymphatic:  No anemia, purpura, petechiae. Allergic/Immunologic: No itchy/runny eyes, nasal congestion, recent allergic reactions, rashes  ALLERGIES: Allergies  Allergen Reactions   Other Other (See Comments)    Can only eat at home    HOME MEDICATIONS:  Current Outpatient Medications:    amLODipine-valsartan (EXFORGE) 5-160 MG tablet, Take 1 tablet by mouth in the morning., Disp: , Rfl:    busPIRone (BUSPAR) 10 MG tablet, Take 20 mg by mouth 2 (two) times daily., Disp: , Rfl:    Cholecalciferol (VITAMIN D3) 50 MCG (2000 UT) TABS, Take 2,000 Units by mouth in the morning., Disp: , Rfl:    COPAXONE 40 MG/ML SOSY, Inject 40mg  subcutaneously three (3) times per week, Disp: 11.76 mL, Rfl: 11   Cyanocobalamin (VITAMIN B-12) 5000 MCG TBDP, Place 5,000 Units under the tongue 2 (two) times a  week., Disp: , Rfl:    divalproex (DEPAKOTE) 250 MG DR tablet, Take 1,250 mg by mouth daily. , Disp: , Rfl:    Multiple Vitamin (MULTIVITAMIN WITH MINERALS) TABS tablet, Take 1 tablet by mouth in the morning., Disp: , Rfl:    SYNTHROID 100 MCG tablet, Take 100 mcg by mouth daily before breakfast. , Disp: , Rfl:    testosterone enanthate (DELATESTRYL) 200 MG/ML injection, Inject 150 mg into the muscle every 14 (fourteen) days. For IM use only  Every three weeks, Disp: , Rfl:   PAST MEDICAL HISTORY: Past Medical History:  Diagnosis Date   Anxiety    Asperger's syndrome    Autistic  spectrum disorder    Asperger's (per H&P Olivia Clelland, PA)   Bipolar 1 disorder (HCC)    "no mood swings in 20 years"  on medication   GERD (gastroesophageal reflux disease)    Head injury 2020   Hypertension    Hypothyroidism    IBS (irritable bowel syndrome)     PAST SURGICAL HISTORY: Past Surgical History:  Procedure Laterality Date   NO PAST SURGERIES     RADIOLOGY WITH ANESTHESIA N/A 05/16/2020   Procedure: MRI BRAIN WITH AND WITHOUT CONTRAST;  Surgeon: Radiologist, Medication, MD;  Location: [facility] OR;  Service: Radiology;  Laterality: N/A;   RADIOLOGY WITH ANESTHESIA N/A 08/29/2020   Procedure: MRI WITH ANESTHESIA CERVICAL SPINE WITH AND WITHOUT CONTRAST, THORACIC SPINE WITH AND WITHOUT CONTRAST;  Surgeon: Radiologist, Medication, MD;  Location: [facility] OR;  Service: Radiology;  Laterality: N/A;   RADIOLOGY WITH ANESTHESIA N/A 03/15/2021   Procedure: MRI BRAIN WITH AND WITHOUT, CERIVAL WITH AND WITHOUT;  Surgeon: Radiologist, Medication, MD;  Location: [facility] OR;  Service: Radiology;  Laterality: N/A;    FAMILY HISTORY: Family History  Problem Relation Age of Onset   Hypertension Father    Depression Sister    Hypertension Paternal Grandfather     SOCIAL HISTORY:  Social History   Socioeconomic History   Marital status: Single    Spouse name: Not on file   Number of children: 0   Years of  education: college   Highest education level: Not on file  Occupational History    Comment: artist  Tobacco Use   Smoking status: Never   Smokeless tobacco: Never  Vaping Use   Vaping Use: Never used  Substance and Sexual Activity   Alcohol use: No   Drug use: No   Sexual activity: Not on file  Other Topics Concern   Not on file  Social History Narrative   He lives at home.    He is single and has no children.     He denies use of tobacco, alcohol, illicit drugs and caffeine.    He works as an Tree surgeon.    Investment banker, operational: Not on [employer] Insecurity: Not on file  Transportation Needs: Not on file  Physical Activity: Not on file  Stress: Not on file  Social Connections: Not on file  Intimate Partner Violence: Not on file     PHYSICAL EXAM  Vitals:   12/17/22 1503  BP: (!) 132/93  Pulse: 79  Weight: 161 lb 3.2 oz (73.1 kg)  Height: 5\' 7"  (1.702 m)    Body mass index is 25.25 kg/m.   General: The patient is well-developed and well-nourished and in no acute distress  HEENT:  Head is [place]/AT.  Sclera are anicteric.   Neck:  The neck is nontender.  Skin: Extremities are without rash or  edema.   Neurologic Exam  Mental status: The patient is alert and oriented x 3 at the time of the examination. The patient has apparent normal recent and remote memory, with an apparently normal attention span and concentration ability.   Speech is normal.  Cranial nerves: Extraocular movements are full.   There is mild weakness in the right lower face  Facial sensation is normal.     Trapezius and sternocleidomastoid strength is normal. No dysarthria is noted.    No obvious hearing deficits are noted.  Motor:  Muscle bulk is normal.   Tone is normal. Strength is  5 /  5 in left side, 5-/5 right arm (mildly reduced RAM)  4+/5 right  leg.   Mild reduced RAM on the riht arm and leg  Sensory: Sensory testing is intact to pinprick, soft touch  and vibration sensation in arms and slightly reduced vibraiton in right leg.  Coordination: Cerebellar testing reveals mildly reduced coordination right hand and foot.   Reduced heel-to-shin.  Gait and station: Station is normal.   He has a right foot drop.  The tandem gait is wide.   Romberg is negative.   Reflexes: Deep tendon reflexes are symmetric and normal in arms and increased right leg.        DIAGNOSTIC DATA (LABS, IMAGING, TESTING) - I reviewed patient records, labs, notes, testing and imaging myself where available.  Lab Results  Component Value Date   WBC 7.1 09/11/2020   HGB 17.4 09/11/2020   HCT 52.4 (H) 09/11/2020   MCV 85 09/11/2020   PLT 206 09/11/2020      Component Value Date/Time   NA 138 03/15/2021 0934   NA 142 09/11/2020 1035   K 4.2 03/15/2021 0934   CL 104 03/15/2021 0934   CO2 26 03/15/2021 0934   GLUCOSE 87 03/15/2021 0934   BUN 14 03/15/2021 0934   BUN 16 09/11/2020 1035   CREATININE 1.22 03/15/2021 0934   CREATININE 1.14 03/04/2014 1310   CALCIUM 9.2 03/15/2021 0934   PROT 7.3 09/11/2020 1035   ALBUMIN 4.6 09/11/2020 1035   AST 16 09/11/2020 1035   ALT 20 09/11/2020 1035   ALKPHOS 67 09/11/2020 1035   BILITOT 0.3 09/11/2020 1035   GFRNONAA >60 03/15/2021 0934   GFRAA 72 09/11/2020 1035   No results found for: "CHOL", "HDL", "LDLCALC", "LDLDIRECT", "TRIG", "CHOLHDL" Lab Results  Component Value Date   HGBA1C 5.4 09/11/2020   Lab Results  Component Value Date   VITAMINB12 >2000 (H) 09/11/2020   Lab Results  Component Value Date   TSH 1.400 09/11/2020       ASSESSMENT AND PLAN  Multiple sclerosis (HCC)  Right sided weakness  Dysphagia, unspecified type  Autism  Gait disturbance  Bipolar disease, chronic (HCC)   Continue glatiramer.    He was most concerned about drug safety when a DMT was chosen   His mother would like him to have a second opinion (she gave him a name of a neurologist who actually subspecializes in  neuromuscular not MS so he would like to hold off).  I let him know I am fine with him getting a second opinion.   Last MRI showed no new lesions.   We discussed a monthly glatiramer may be available soon if MRIs continue to show good efficacy from this MOA 2.   Stay active and exercie as tolerated. 3.   Continue vit D supplements 4.   Return to see [place] in 6 months or sooner for new or worsening neurologic symptoms.   Check another MRI around time of next visit  (prefers open)   Machael Raine A. Epimenio Foot, MD, [facility] 12/17/2022, 3:31 PM Certified in Neurology, Clinical Neurophysiology, Sleep Medicine and Neuroimaging  [facility] [address] [place], [place] [address] [phone]

## 2022-12-18 ENCOUNTER — Other Ambulatory Visit: Payer: Self-pay | Admitting: Neurology

## 2022-12-18 ENCOUNTER — Other Ambulatory Visit: Payer: Self-pay

## 2022-12-18 DIAGNOSIS — F319 Bipolar disorder, unspecified: Secondary | ICD-10-CM | POA: Diagnosis not present

## 2022-12-19 DIAGNOSIS — E291 Testicular hypofunction: Secondary | ICD-10-CM | POA: Diagnosis not present

## 2022-12-23 ENCOUNTER — Ambulatory Visit: Payer: PPO | Admitting: Physical Therapy

## 2022-12-23 ENCOUNTER — Encounter: Payer: Self-pay | Admitting: Physical Therapy

## 2022-12-23 DIAGNOSIS — R2681 Unsteadiness on feet: Secondary | ICD-10-CM

## 2022-12-23 DIAGNOSIS — M6281 Muscle weakness (generalized): Secondary | ICD-10-CM

## 2022-12-23 DIAGNOSIS — R278 Other lack of coordination: Secondary | ICD-10-CM | POA: Diagnosis not present

## 2022-12-23 DIAGNOSIS — Z9181 History of falling: Secondary | ICD-10-CM

## 2022-12-23 NOTE — Therapy (Unsigned)
OUTPATIENT PHYSICAL THERAPY NEURO TREATMENT   Patient Name: George [name] MRN: [id number] DOB:[birth year], 52 y.o., male Today's Date: 12/23/2022   PCP: Kirby Funk, MD REFERRING PROVIDER: Asa Lente, MD  END OF SESSION:  PT End of Session - 12/23/22 1107     Visit Number 5    Number of Visits 9   8 + eval   Date for PT Re-Evaluation 01/31/23   pushed out due to scheduling preferences of patient   Authorization Type [employer]    PT Start Time 1102    PT Stop Time 1146    PT Time Calculation (min) 44 min    Equipment Utilized During Treatment Gait belt    Activity Tolerance Patient tolerated treatment well;Patient limited by fatigue    Behavior During Therapy WFL for tasks assessed/performed            Past Medical History:  Diagnosis Date   Anxiety    Asperger's syndrome    Autistic spectrum disorder    Asperger's (per H&P Olivia Clelland, PA)   Bipolar 1 disorder (HCC)    "no mood swings in 20 years"  on medication   GERD (gastroesophageal reflux disease)    Head injury 2020   Hypertension    Hypothyroidism    IBS (irritable bowel syndrome)    Past Surgical History:  Procedure Laterality Date   NO PAST SURGERIES     RADIOLOGY WITH ANESTHESIA N/A 05/16/2020   Procedure: MRI BRAIN WITH AND WITHOUT CONTRAST;  Surgeon: Radiologist, Medication, MD;  Location: [facility] OR;  Service: Radiology;  Laterality: N/A;   RADIOLOGY WITH ANESTHESIA N/A 08/29/2020   Procedure: MRI WITH ANESTHESIA CERVICAL SPINE WITH AND WITHOUT CONTRAST, THORACIC SPINE WITH AND WITHOUT CONTRAST;  Surgeon: Radiologist, Medication, MD;  Location: [facility] OR;  Service: Radiology;  Laterality: N/A;   RADIOLOGY WITH ANESTHESIA N/A 03/15/2021   Procedure: MRI BRAIN WITH AND WITHOUT, CERIVAL WITH AND WITHOUT;  Surgeon: Radiologist, Medication, MD;  Location: [facility] OR;  Service: Radiology;  Laterality: N/A;   Patient Active Problem List   Diagnosis Date Noted   Bipolar disease, chronic (HCC)  06/20/2021   Slurred speech 02/26/2021   Autism 12/20/2020   Multiple sclerosis (HCC) 10/11/2020   MRI of brain abnormal 10/11/2020   Right sided weakness 10/11/2020   Gait disturbance 10/11/2020   Asperger syndrome 03/19/2013    ONSET DATE: 11/18/2022 (referral date)  REFERRING DIAG: G35 (ICD-10-CM) - Multiple sclerosis R26.9 (ICD-10-CM) - Gait disturbance R53.1 (ICD-10-CM) - Right sided weakness R53.1 (ICD-10-CM) - Weakness  THERAPY DIAG:  Other lack of coordination  Muscle weakness (generalized)  Unsteadiness on feet  History of falling  Rationale for Evaluation and Treatment: Rehabilitation  SUBJECTIVE:  SUBJECTIVE STATEMENT: He denies falls or near falls.  He has not been exercising and states he is tired today as he did not sleep most of last night, but is unsure why. Pt accompanied by: self - he drives himself  PERTINENT HISTORY: Autism, MS, Bipolar 1, HTN, head injury 2020  FROM MD NOTE:  "MS HISTORY: He has a h/o Asperger syndrome and bipolar disease.  In 2012, he had an MRI of the brain and was found to have multiple T2/FLAIR hyperintense foci in the brain.  A lumbar puncture at the time was negative.  Over the last year, he began to have more difficulty with right hand coordination and left foot drop.  Also the right side of his face was drooping some.  He came in to see Dr. Marjory Lies and had repeat imaging studies showing progression of the white matter foci and the appearance of a small focus at C5.  Repeat LP, however, showed negative CSF (no OCB) again.  He started glatiramer 10/2020."  PAIN:  Are you having pain? No  PRECAUTIONS: Fall  WEIGHT BEARING RESTRICTIONS: No  FALLS: Has patient fallen in last 6 months? No  LIVING ENVIRONMENT: Lives with: lives alone Lives in:  House/apartment Stairs: Yes: External: 2 steps; none Has following equipment at home: Grab bars and Ramped entry-he uses ramp w/o issue  PLOF: Independent  PATIENT GOALS: "to strengthen my ankles and for my throat" - PT to obtain ST referral.  OBJECTIVE:   DIAGNOSTIC FINDINGS: Imaging studies reviewed: MRI of the brain 11/21/2010 shows multiple T2/FLAIR hyperintense foci including a focus in the left pons, middle cerebellar peduncle, right thalamus.  Some foci of periventricular and some foci are juxtacortical.  None of the foci enhanced.   MRI of the brain 05/16/2020 showed a similar pattern at the 2012 MRI but there has been mild progression with at least 2 new lesions noted in the white matter compared to the previous MRI.  As seen before there is a large focus involving the left greater than right pons.  It may be slightly larger.  There is also involvement of the middle cerebellar peduncle which is stable on the right but a new focus on the left.  There is one stable focus in the right thalamus.   MRI of the cervical spine 08/29/2020 showed a small focus posteriorly to the right adjacent to C5.  Mild DJD at C5-C6 not causing nerve root compression.   MRI of the thoracic spine 08/29/2020 showed a normal spinal cord (subtle focus at T10-T11 more likely artifact).  There are cystic lesions within the kidneys   MRI of the brain 03/15/2021 and it was unchanged.   No new foci. IMPRESSION: Multiple supratentorial and infratentorial T2 hyperintense lesions consistent with the clinical diagnosis of multiple sclerosis. No new or enhancing lesion identified.   MRI of the cervical spine 03/15/2021 showed a focus to the right at [facility].  COGNITION: Overall cognitive status: Within functional limits for tasks assessed   SENSATION: Light touch: WFL  COORDINATION: RLE more dysmetric than LLE noted on LE RAMS and heel-to-shin assessments.  EDEMA:  None noted in BLE  MUSCLE TONE: None noted in BLE  during functional tasks.  POSTURE: No Significant postural limitations  LOWER EXTREMITY ROM:     Active  Right Eval Left Eval  Hip flexion [facility]  Hip extension   Hip abduction   Hip adduction   Hip internal rotation   Hip external rotation   Knee flexion  Knee extension   Ankle dorsiflexion   Ankle plantarflexion   Ankle inversion    Ankle eversion     (Blank rows = not tested)  LOWER EXTREMITY MMT:    MMT Right Eval Left Eval  Hip flexion 4/5 4+/5  Hip extension    Hip abduction    Hip adduction    Hip internal rotation    Hip external rotation    Knee flexion    Knee extension 4+/5 5/5  Ankle dorsiflexion 3+/5 4+/5  Ankle plantarflexion    Ankle inversion    Ankle eversion    (Blank rows = not tested)  BED MOBILITY:  Sit to supine Complete Independence Supine to sit Complete Independence  TRANSFERS: Assistive device utilized: None  Sit to stand: Complete Independence Stand to sit: Complete Independence Chair to chair: Complete Independence  GAIT: Gait pattern: decreased arm swing- Right, decreased arm swing- Left, decreased stride length, decreased hip/knee flexion- Right, and decreased ankle dorsiflexion- Right Distance walked: various clinic distances Assistive device utilized: None Level of assistance: SBA Comments: Intermittent right knee hyperextension and steppage gait to compensate for foot drop.  FUNCTIONAL TESTS:  5 times sit to stand: 10.75 seconds w/ intermittent LUE support Functional gait assessment: 19/30  [facility] PT Assessment - 12/23/22 1140       Functional Gait  Assessment   Gait assessed  Yes    Gait Level Surface Walks 20 ft in less than 7 sec but greater than 5.5 sec, uses assistive device, slower speed, mild gait deviations, or deviates 6-10 in outside of the 12 in walkway width.    Gait with Horizontal Head Turns Performs head turns with moderate changes in gait velocity, slows down, deviates 10-15 in outside 12 in  walkway width but recovers, can continue to walk.    Gait with Vertical Head Turns Performs task with slight change in gait velocity (eg, minor disruption to smooth gait path), deviates 6 - 10 in outside 12 in walkway width or uses assistive device    Gait and Pivot Turn Pivot turns safely within 3 sec and stops quickly with no loss of balance.    Gait with Eyes Closed Walks 20 ft, slow speed, abnormal gait pattern, evidence for imbalance, deviates 10-15 in outside 12 in walkway width. Requires more than 9 sec to ambulate 20 ft.    Ambulating Backwards Walks 20 ft, uses assistive device, slower speed, mild gait deviations, deviates 6-10 in outside 12 in walkway width.             PATIENT SURVEYS:  None relevant to CC.  TODAY'S TREATMENT:                                                                                                                              DATE: 12/23/2022  -Right ankle alphabet in capital letters -Seated heel and toe raises x20 w/ 2-3 second hold to engage right DF -Extensive conversation about sleep schedule,  exercise routine, and continuing to see his counselor for strategies to deal with his depression and managing his feelings of loneliness.  Discussed free community classes in [place] to encourage pt to resume activity in social setting as he desires.  Discussed cooling vest option (free through [employer]), provided printed resource. -Seated figure 4 DF w/ resistance using red theraband 2x10 -Staggered STS w/ RLE in rear x10, pt has initial LOB w/ independent foot adjustment for improved performance; discussed options for UE support like cabinet if needed, pt to buy regular chair vs current options -Standing mini lunges w/ countertop support x5 each LE, discussed light progression using width of stride vs depth of lunge  Provided HEP reprint per request.  -FGA:  [facility] PT Assessment - 12/23/22 1140       Functional Gait  Assessment   Gait assessed  Yes     Gait Level Surface Walks 20 ft in less than 7 sec but greater than 5.5 sec, uses assistive device, slower speed, mild gait deviations, or deviates 6-10 in outside of the 12 in walkway width.    Gait with Horizontal Head Turns Performs head turns with moderate changes in gait velocity, slows down, deviates 10-15 in outside 12 in walkway width but recovers, can continue to walk.    Gait with Vertical Head Turns Performs task with slight change in gait velocity (eg, minor disruption to smooth gait path), deviates 6 - 10 in outside 12 in walkway width or uses assistive device    Gait and Pivot Turn Pivot turns safely within 3 sec and stops quickly with no loss of balance.    Gait with Eyes Closed Walks 20 ft, slow speed, abnormal gait pattern, evidence for imbalance, deviates 10-15 in outside 12 in walkway width. Requires more than 9 sec to ambulate 20 ft.    Ambulating Backwards Walks 20 ft, uses assistive device, slower speed, mild gait deviations, deviates 6-10 in outside 12 in walkway width.            PATIENT EDUCATION: Education details:  Scheduling OT eval at end of session.  Continue HEP w/ additions and return to gym slowly as able to maintain activity.  Speak to counselor about ongoing depression symptoms and need for more social interaction-provided resources to help pt get involved in community activity as heat tolerance allows (cooling vest options).  Did discuss progression of FGA being limited by ongoing foot drop and benefit of bracing-pt still resistant to options and PT to respect pt preference. Person educated: Patient Education method: Explanation Education comprehension: verbalized understanding  HOME EXERCISE PROGRAM: Access Code: [email or web address] URL: [email or web address] Date: 12/02/2022 Prepared by: Camille Bal  Exercises - Seated Ankle Alphabet  - 1 x daily - 7 x weekly - 3 sets - 10 reps - Seated Heel Toe Raises  - 1 x daily - 5 x weekly - 3 sets - 4-5  reps - 3 seconds each hold - Ankle Dorsiflexion with Resistance  - 1 x daily - 5 x weekly - 1-2 sets - 10 reps - Staggered Sit-to-Stand  - 1 x daily - 7 x weekly - 2 sets - 10 reps - Squat with Chair Touch  - 1 x daily - 7 x weekly - 2 sets - 10 reps - Lunge with Counter Support  - 1 x daily - 7 x weekly - 2 sets - 10 reps  GOALS: Goals reviewed with patient? Yes  SHORT TERM GOALS: Target date: 12/27/2022  Pt will be independent  with initial strength and balance HEP to address right LE weakness and foot drop and maintain progress from PT. Baseline:  Pt intermittently compliant, he is independent (5/20) Goal status: MET  2.  Pt will improve FGA score to >/=23/30 in order to demonstrate improved balance and decreased fall risk. Baseline: 19/30 Goal status: INITIAL  3.  Pt will trial right foot-up brace to assess benefit to safe walking mechanics and managing foot-drop with purchase as desired. Baseline: Pt not open to option at this time. (5/20) Goal status: REVISED-D/C'd  LONG TERM GOALS: Target date: 01/24/2023  Pt will report adherence to walking program x3 days or more per week in order to maintain BLE strength and to continue practicing safe walking mechanics. Baseline: To be established. Goal status: INITIAL  2.  Pt will improve FGA score to >/=27/30 in order to demonstrate improved balance and decreased fall risk. Baseline: 19/30 Goal status: INITIAL  3.  AFO to be trialed with order placed as appropriate if pt is agreeable and foot-up brace is not a better option. Baseline: To be assessed. Goal status: INITIAL  ASSESSMENT:  CLINICAL IMPRESSION: Assessed STGs this session with patient trying to return to HEP.  He has been dealing with some personal factors impacting his motivation to exercise, but has resources he is using to manage these.  Began re-assessing the FGA without completion this session.  Pt remains open to trialing an AFO next session for unlevel and long  distance safety only, but cannot tolerate ankle component of foot-up brace.    OBJECTIVE IMPAIRMENTS: Abnormal gait, decreased balance, decreased coordination, and decreased strength.   ACTIVITY LIMITATIONS: stairs and locomotion level  PARTICIPATION LIMITATIONS: driving and community activity  PERSONAL FACTORS: Behavior pattern, Past/current experiences, Time since onset of injury/illness/exacerbation, and 1-2 comorbidities: Autism-sensory intolerances limiting bracing options, MS  are also affecting patient's functional outcome.   REHAB POTENTIAL: Excellent  CLINICAL DECISION MAKING: Stable/uncomplicated  EVALUATION COMPLEXITY: Low  PLAN:  PT FREQUENCY: 1x/week  PT DURATION: 8 weeks  PLANNED INTERVENTIONS: Therapeutic exercises, Therapeutic activity, Neuromuscular re-education, Balance training, Gait training, Patient/Family education, Self Care, Joint mobilization, Stair training, Vestibular training, Orthotic/Fit training, DME instructions, and Re-evaluation  PLAN FOR NEXT SESSION:  Modifiy HEP prn for RLE NMR/strength/foot drop.  Trial AFO for grass and long distances.  Establish walking program.  Finish FGA.   Sadie Haber, PT, DPT 12/23/2022, 11:49 AM

## 2022-12-25 DIAGNOSIS — F319 Bipolar disorder, unspecified: Secondary | ICD-10-CM | POA: Diagnosis not present

## 2022-12-31 DIAGNOSIS — F319 Bipolar disorder, unspecified: Secondary | ICD-10-CM | POA: Diagnosis not present

## 2023-01-01 ENCOUNTER — Encounter (HOSPITAL_COMMUNITY): Payer: Self-pay | Admitting: Behavioral Health

## 2023-01-01 ENCOUNTER — Ambulatory Visit (HOSPITAL_COMMUNITY)
Admission: EM | Admit: 2023-01-01 | Discharge: 2023-01-01 | Disposition: A | Discharge status: Home / Self Care | Payer: PPO | Attending: Behavioral Health | Admitting: Behavioral Health

## 2023-01-01 ENCOUNTER — Encounter: Payer: Self-pay | Admitting: Physical Therapy

## 2023-01-01 ENCOUNTER — Ambulatory Visit: Payer: PPO | Admitting: Physical Therapy

## 2023-01-01 DIAGNOSIS — F319 Bipolar disorder, unspecified: Secondary | ICD-10-CM | POA: Insufficient documentation

## 2023-01-01 DIAGNOSIS — R2681 Unsteadiness on feet: Secondary | ICD-10-CM

## 2023-01-01 DIAGNOSIS — F32A Depression, unspecified: Secondary | ICD-10-CM | POA: Diagnosis not present

## 2023-01-01 DIAGNOSIS — R278 Other lack of coordination: Secondary | ICD-10-CM

## 2023-01-01 DIAGNOSIS — F845 Asperger's syndrome: Secondary | ICD-10-CM | POA: Diagnosis not present

## 2023-01-01 DIAGNOSIS — M6281 Muscle weakness (generalized): Secondary | ICD-10-CM

## 2023-01-01 DIAGNOSIS — Z9181 History of falling: Secondary | ICD-10-CM

## 2023-01-01 NOTE — Progress Notes (Signed)
   01/01/23 1220  [facility] Triage Screening (Walk-ins at [facility] only)  How Did You Hear About [place]? Self  What Is the Reason for Your Visit/Call Today? Patient presents from [facility] PT appt with his PT due to concerns he is experiencing worsening depression.  Patient reports hx of Bipolar and he states his medications "are working fine and I don't need them changed."  He also shares he sees a therapist weekly, with next appt on Wednesday.  He states the PT did not focus on PT needs and "just talked to me" during the appt today.  Patient made several statements about "just needing to talk to someone."  He didn't think he could wait until Wednesday  Patient has MS, Autism Spectrum D/O and is on disability.  He lives alone and has never been married, no kids.  He states his parents are his only support and they are "now in their 60's.  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  Patient endorsed SI, however denies hx of attempts.  He mentions a somewhat unattainable plan he "has in the back of my mind."  He states the [place]  He laughs as he denies current intent, stating, "Well my check engine light is on so...Marland KitchenMarland Kitchen"  Patient is clearly seeking support in this moment.  He reports feeling improved now that "I was able to speak with you."  He affirms his safety and decided to leave before receiving AVS.  How Long Has This Been Causing You Problems? > than 6 months  Have You Recently Had Any Thoughts About Hurting Yourself? Yes  How long ago did you have thoughts about hurting yourself? passive SI, no current intent  Are You Planning to Commit Suicide/Harm Yourself At This time? No  Have you Recently Had Thoughts About Hurting Someone Karolee Ohs? No  Are You Planning To Harm Someone At This Time? No  Are you currently experiencing any auditory, visual or other hallucinations? No  Have  You Used Any Alcohol or Drugs in the Past 24 Hours? No  Do you have any current medical co-morbidities that require immediate attention? No  Clinician description of patient physical appearance/behavior: Patient is calm, cooperative AAOx4  What Do You Feel Would Help You the Most Today? Treatment for Depression or other mood problem  If access to [facility] was not available, would you have sought care in the Emergency Department? No  Determination of Need Routine (7 days)  Options For Referral Outpatient Therapy;Medication Management

## 2023-01-01 NOTE — ED Notes (Signed)
Patient left the building and premises prior to vitals being taken or being assessed by a provider.

## 2023-01-01 NOTE — ED Provider Notes (Signed)
[facility]al [facility] Medical Screening Exam  Patient Name: George [name] MRN: [id number] Date of Evaluation: 01/01/23 Chief Complaint:  "It's helpful just to talk to someone" Diagnosis:  Final diagnoses:  Depression, unspecified depression type   History of Present Illness: George [name] is a 52 y.o. male patient with a past psychiatric history of bipolar 1 disorder, anxiety, depression, Asperger's syndrome, and autistic spectrum disorder who presented to [facility] voluntarily and accompanied by his physical therapist with complaints of worsening depression. Physical therapist did not remain present during assessment.  Patient assessed face-to-face by this provider, consulted with Dr. Viviano Simas, and chart reviewed on 01/01/23. On evaluation, George [name] is seated in assessment area in no acute distress. Patient is alert and oriented x4, calm, cooperative, and pleasant. Speech is clear and coherent, normal rate and volume. Eye contact is good. Mood is depressed with congruent affect. Thought process is coherent with logical thought content. Patient reports chronic passive suicidal ideations "for many years." Patient states when he was a teenager, he "had a dream where I fell off the [facility] in [place]" Patient laughs and states he has kept this dream "in the back of my mind" as a suicide plan because it is "so far-fetched it would never happen." Patient reports he has multiple sclerosis, went to physical therapy today, and couldn't engage in his physical therapy session because of his depression. Patient states his physical therapist "just talked to me instead" and recommended patient come to [facility] to be evaluated. Patient states "it's helpful just to talk to someone, when I talk to someone I feel better." Patient denies that he has any plans or intentions of harming himself and easily contracts verbally for safety with this Clinical research associate. Patient denies a history of suicide attempts or  self-harm. Patient denies homicidal ideations. Patient reports a past psychiatric hospitalization when he was approximately 52 y/o at [facility]. Patient denies auditory and visual hallucinations. Patient denies symptoms of paranoia. Patient is able to converse coherently with goal-directed thoughts and no distractibility or preoccupation. Objectively, there is no evidence of psychosis/mania, delusional thinking, or indication that patient is responding to internal or external stimuli.  Patient reports good sleep (8 hours/night) and good appetite. Patient lives alone in [place] and denies access to weapons. Patient states he sometimes feels lonely being at home by himself. Patient denies use of alcohol or illicit substances. 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 Patient states he has dealt with depression since he was a teenager related to his diagnoses of multiple sclerosis, Autism, and bipolar 1 disorder. Patient states his primary support system is his mother and father who are getting older. Patient states he would call his parents if his suicidal ideations ever worsened/became active. Patient shares that he also has an 51 y/o friend. Patient sees Dr. Evelene Croon for medication management and reports he takes Depakote and Buspar and "my medications are working fine, I don't need them changed, I haven't had any issues with that recently." Patient states he sees a therapist in [place] "off [address]" weekly, has an appointment on Wednesday, but felt he couldn't wait until Wednesday and wanted to speak to someone today. Patient reports his mood has improved now that "I was able to speak with you."   Patient offered support and encouragement. Discussed patient continuing to see Dr. Evelene Croon for medication management and maintaining his weekly therapy appointments. Discussed with patient following  up with additional  outpatient psychiatric resources provided in AVS for therapy/medication management. Patient is in agreement with plan of care.  At this time, George [name] is educated and verbalizes understanding of mental health resources and other crisis services in the community. He is instructed to call 911 and present to the nearest emergency room should he experience any suicidal/homicidal ideation, auditory/visual/hallucinations, or detrimental worsening of his mental health condition.    Patient affirmed his safety and decided to leave the building prior to his vital signs being taken or receiving AVS/resources.  Flowsheet Row Admission (Discharged) from 03/15/2021 in [place] [facility]IOPER[facility]IVE ARE[facility] Admission (Discharged) from 08/29/2020 in [place] PERIOPERATIVE AREA  C-SSRS RISK CATEGORY No Risk High Risk       Psychiatric Specialty Exam  Presentation  General Appearance:Appropriate for Environment; Casual; Well Groomed  Eye Contact:Good  Speech:Clear and Coherent; Normal Rate  Speech Volume:Normal  Handedness:Right   Mood and Affect  Mood: Depressed  Affect: Congruent   Thought Process  Thought Processes: Coherent; Goal Directed  Descriptions of Associations:Intact  Orientation:Full (Time, Place and Person)  Thought Content:Logical    Hallucinations:None  Ideas of Reference:None  Suicidal Thoughts:Yes, Passive (Patient reports chronic SI "for many years") Without Intent; Without Means to Carry Out; Without Access to Means; Without Plan  Homicidal Thoughts:No   Sensorium  Memory: Immediate Good; Recent Good; Remote Good  Judgment: Good  Insight: Good   Executive Functions  Concentration: Good  Attention Span: Good  Recall: Good  Fund of Knowledge: Good  Language: Good   Psychomotor Activity  Psychomotor Activity: Normal   Assets  Assets: Communication Skills; Desire for Improvement; Financial Resources/Insurance; Housing; Leisure Time;  Resilience; Social Support; Transportation   Sleep  Sleep: Good  Number of hours:  8   Physical Exam: Physical Exam Nursing note reviewed. Vitals reviewed: Patient left the building prior to vitals being taken. Constitutional:      General: He is not in acute distress.    Appearance: Normal appearance. He is not ill-appearing.  HENT:     Head: Normocephalic and atraumatic.     Nose: Nose normal.  Eyes:     General:        Right eye: No discharge.        Left eye: No discharge.     Conjunctiva/sclera: Conjunctivae normal.  Pulmonary:     Effort: Pulmonary effort is normal. No respiratory distress.  Musculoskeletal:        General: Normal range of motion.     Cervical back: Normal range of motion.  Skin:    General: Skin is warm and dry.  Neurological:     General: No focal deficit present.     Mental Status: He is alert and oriented to person, place, and time. Mental status is at baseline.  Psychiatric:        Attention and Perception: Attention and perception normal.        Mood and Affect: Affect normal. Mood is depressed.        Speech: Speech normal.        Behavior: Behavior normal. Behavior is cooperative.        Thought Content: Thought content normal. Thought content is not paranoid or delusional. Thought content does not include homicidal or suicidal ideation. Thought content does not include homicidal or suicidal plan.        Cognition and Memory: Cognition and memory normal.        Judgment: Judgment normal.  Comments: Patient reports chronic SI "for many years"    Review of Systems  Constitutional: Negative.   HENT: Negative.    Eyes: Negative.   Respiratory: Negative.    Cardiovascular: Negative.   Gastrointestinal: Negative.   Genitourinary: Negative.   Musculoskeletal: Negative.   Skin: Negative.   Neurological: Negative.   Endo/Heme/Allergies: Negative.   Psychiatric/Behavioral:  Positive for depression. Negative for hallucinations, memory  loss, substance abuse and suicidal ideas. The patient is not nervous/anxious and does not have insomnia.        Patient reports chronic SI "for many years"   There were no vitals taken for this visit. There is no height or weight on file to calculate BMI. Patient left the building prior to vitals being taken.  Musculoskeletal: Strength & Muscle Tone: within normal limits Gait & Station: normal Patient leans: N/A   [facility] MSE Discharge Disposition for Follow up and Recommendations: Based on my evaluation the patient does not appear to have an emergency medical condition and can be discharged with resources and follow up care in outpatient services for Medication Management and Individual Therapy   Sunday Corn, NP 01/01/2023, 2:07 PM

## 2023-01-01 NOTE — Discharge Instructions (Addendum)
It is imperative that you follow through with treatment recommendations within 5-7 days from the day of discharge to mitigate further risk to your safety and overall mental well-being.  A list of outpatient therapy and psychiatric providers for medication management has been provided below to get you started in finding the right provider for you.            [place]  [facility] [address] [place], [place], [address] [phone] phone (Medicare, Private insurance except Tricare, Blue Local, and Blue Home)  [facility] [address] [place], [place], [address] [phone] phone (Aetna, AmeriHealth [place], BCBS, Cigna, Evernorth, Friday Health Plans, Gateway Health, BCBS Healthy Blue, Humana, Magellan Health, Medcost, Medicare, Medicaid, Optum, Tricare, UHC, UHC Community Plan, Wellcare)  [facility] [address] [place], [place], [address] [phone] phone [phone] phone [phone] fax  [facility] [address] [place], [place], [address] [phone] phone (Call to confirm insurance coverage) Consultation & Support Services     o Drop-In Hours: 1:00 PM to 5:00 PM     o Days: Monday - Thursday  Crisis Services (24/7)   [facility] [address] [place], [place], [address] [phone] phone (Healthy Blue [place], UHC, [place] Medicaid, Vaya and Partners, Humana)      [facility] [address] [place], [place], [address] [phone] phone [email or web address]  (to complete the intake form and upload ID and insurance cards)  [facility] [address] [place], [place], [address] [phone] phone (Aetna, Alliance, BCBS, [place] Complete Health, Cigna, Medicare, Optum, UHC, Wellcare, and certain Medicaid plans)  [facility] [address] [place], [place], [address] [phone] phone [phone] fax (Medicaid, Medicare, Self-pay, call about other insurance coverage)  [facility] (age 13+) [address] [place], [place], [address] [phone] hone [phone] fax (Cigna, MedCost, BCBS, Aetna, Magellan, First Health, Healthgram, Humana, Tricare, Multiplan, certain Medicare providers, UHC, UMR)  [facility] [address] [place], [place], [address] [phone] phone (Medicare, Medicaid, Aetna, Cigna, call about other insurance coverage)  [facility] [address] [place], [place], [address] [phone] phone [phone] fax (Call [phone] to see what insurance is accepted) (Gerald Plovsky, MD specializes in geropsych)  [facility]  (medication management only) [address] [place], [place], [address] [phone] phone [phone] fax (Medicare, Medicaid, BCBS, Tricare, Humana, MedCost, Cigna, UHC, UMR)  [facility] (medication management only) [address] [place], [place], [address] [phone] phone [phone] fax (Cigna, Medicare, BCBS, Aetna, Tricare East)  [facility] [address] [place], [place], [address] [phone] phone [phone] fax (Aetna, BCBS, Cigna, Magellan, MedCost, UHC, [place] Medicaid/[place] Health Choice)  [facility] [address] [place], [place], [address] [phone] phone [phone] fax (Medicare, Medicaid, BCBS)  [facility] [address] [place], [place] [address] [phone] phone (Aetna, BCBS, Cigna, CBHA, MedCost, Medicare, Magellan, UHC) Does genetic testing for medications; does transcranial magnetic stimulation along with basic services)  [facility] [address] [place], [place],  [address] [phone] phone (Call about insurance coverage)  [facility] [address] , , [address] [phone] phone [phone] fax (Call about insurance coverage)  [facility] [address] , , [address] [phone] phone [phone]   fax (Call about insurance coverage)  [facility] [address] [place], [place], [address] [phone] phone (Medicaid, Tricare, BCBS, Aetna, Humana)  [facility] [address] [place], [place], [address] [phone] phone (Medicaid, Medicare, call about other insurance coverage)  [facility] [address] [place], [place], [address] [phone] phone [phone] fax (Medicaid, Medicare, Tricare, call about other insurance coverage)  [facility] [address] [place], [place], [address] [phone] phone (Aetna, BCBS, Cigna, MedCost, Tricare, Medicaid types - Alliance, AmeriHealth, Partners, Vaya, [place] Health Choice, Healthy Blue, [place], Wellcare, and Complete)  [facility] [address] [place], [place], [address] [phone] phone Completely online treatment platform Contact: Jonay Argier - Behavioral Health Outreach Specialist [phone] phone [phone] fax (Aetna, BCBS, Cigna, Friday Health Plan, Humana, Magellan, MedCost, Medicaid, Medicare, UBH Optum)                                               

## 2023-01-01 NOTE — Therapy (Signed)
OUTPATIENT PHYSICAL THERAPY NEURO TREATMENT/ARRIVED-NO CHARGE   Patient Name: George [name] MRN: [id number] DOB:March 28, 1971, 52 y.o., male Today's Date: 01/01/2023   PCP: Kirby Funk, MD REFERRING PROVIDER: Asa Lente, MD  END OF SESSION:  PT End of Session - 01/01/23 1109     Visit Number 6    Number of Visits 9   8 + eval   Date for PT Re-Evaluation 01/31/23   pushed out due to scheduling preferences of patient   Authorization Type [employer]    PT Start Time 1104    PT Stop Time 1204    PT Time Calculation (min) 60 min    Equipment Utilized During Treatment Gait belt    Activity Tolerance Patient tolerated treatment well    Behavior During Therapy WFL for tasks assessed/performed            Past Medical History:  Diagnosis Date   Anxiety    Asperger's syndrome    Autistic spectrum disorder    Asperger's (per H&P Olivia Clelland, PA)   Bipolar 1 disorder (HCC)    "no mood swings in 20 years"  on medication   GERD (gastroesophageal reflux disease)    Head injury 2020   Hypertension    Hypothyroidism    IBS (irritable bowel syndrome)    Past Surgical History:  Procedure Laterality Date   NO PAST SURGERIES     RADIOLOGY WITH ANESTHESIA N/A 05/16/2020   Procedure: MRI BRAIN WITH AND WITHOUT CONTRAST;  Surgeon: Radiologist, Medication, MD;  Location: [facility] OR;  Service: Radiology;  Laterality: N/A;   RADIOLOGY WITH ANESTHESIA N/A 08/29/2020   Procedure: MRI WITH ANESTHESIA CERVICAL SPINE WITH AND WITHOUT CONTRAST, THORACIC SPINE WITH AND WITHOUT CONTRAST;  Surgeon: Radiologist, Medication, MD;  Location: [facility] OR;  Service: Radiology;  Laterality: N/A;   RADIOLOGY WITH ANESTHESIA N/A 03/15/2021   Procedure: MRI BRAIN WITH AND WITHOUT, CERIVAL WITH AND WITHOUT;  Surgeon: Radiologist, Medication, MD;  Location: [facility] OR;  Service: Radiology;  Laterality: N/A;   Patient Active Problem List   Diagnosis Date Noted   Bipolar disease, chronic (HCC) 06/20/2021    Slurred speech 02/26/2021   Autism 12/20/2020   Multiple sclerosis (HCC) 10/11/2020   MRI of brain abnormal 10/11/2020   Right sided weakness 10/11/2020   Gait disturbance 10/11/2020   Asperger syndrome 03/19/2013    ONSET DATE: 11/18/2022 (referral date)  REFERRING DIAG: G35 (ICD-10-CM) - Multiple sclerosis R26.9 (ICD-10-CM) - Gait disturbance R53.1 (ICD-10-CM) - Right sided weakness R53.1 (ICD-10-CM) - Weakness  THERAPY DIAG:  Other lack of coordination  Muscle weakness (generalized)  Unsteadiness on feet  History of falling  Rationale for Evaluation and Treatment: Rehabilitation  SUBJECTIVE:  SUBJECTIVE STATEMENT: He denies falls or near falls.  He has returned to exercising.  He states his weekend was boring because he never really does anything.  He continues to have issues with his sleep schedule.  He is sleeping at night now, but states he feels he is using sleep as an escape.  He states he is really lonely.  Pt states he does not want to live and he is ready to "walk off a bridge".  He states that he is tired of not connecting with people. Pt accompanied by: self - he drove himself  PERTINENT HISTORY: Autism, MS, Bipolar 1, HTN, head injury 2020  FROM MD NOTE:  "MS HISTORY: He has a h/o Asperger syndrome and bipolar disease.  In 2012, he had an MRI of the brain and was found to have multiple T2/FLAIR hyperintense foci in the brain.  A lumbar puncture at the time was negative.  Over the last year, he began to have more difficulty with right hand coordination and left foot drop.  Also the right side of his face was drooping some.  He came in to see Dr. Marjory Lies and had repeat imaging studies showing progression of the white matter foci and the appearance of a small focus at C5.  Repeat LP,  however, showed negative CSF (no OCB) again.  He started glatiramer 10/2020."  PAIN:  Are you having pain? No  PRECAUTIONS: Fall  WEIGHT BEARING RESTRICTIONS: No  FALLS: Has patient fallen in last 6 months? No  LIVING ENVIRONMENT: Lives with: lives alone Lives in: House/apartment Stairs: Yes: External: 2 steps; none Has following equipment at home: Grab bars and Ramped entry-he uses ramp w/o issue  PLOF: Independent  PATIENT GOALS: "to strengthen my ankles and for my throat" - PT to obtain ST referral.  OBJECTIVE:   DIAGNOSTIC FINDINGS: Imaging studies reviewed: MRI of the brain 11/21/2010 shows multiple T2/FLAIR hyperintense foci including a focus in the left pons, middle cerebellar peduncle, right thalamus.  Some foci of periventricular and some foci are juxtacortical.  None of the foci enhanced.   MRI of the brain 05/16/2020 showed a similar pattern at the 2012 MRI but there has been mild progression with at least 2 new lesions noted in the white matter compared to the previous MRI.  As seen before there is a large focus involving the left greater than right pons.  It may be slightly larger.  There is also involvement of the middle cerebellar peduncle which is stable on the right but a new focus on the left.  There is one stable focus in the right thalamus.   MRI of the cervical spine 08/29/2020 showed a small focus posteriorly to the right adjacent to C5.  Mild DJD at C5-C6 not causing nerve root compression.   MRI of the thoracic spine 08/29/2020 showed a normal spinal cord (subtle focus at T10-T11 more likely artifact).  There are cystic lesions within the kidneys   MRI of the brain 03/15/2021 and it was unchanged.   No new foci. IMPRESSION: Multiple supratentorial and infratentorial T2 hyperintense lesions consistent with the clinical diagnosis of multiple sclerosis. No new or enhancing lesion identified.   MRI of the cervical spine 03/15/2021 showed a focus to the right at  [facility].  COGNITION: Overall cognitive status: Within functional limits for tasks assessed   SENSATION: Light touch: WFL  COORDINATION: RLE more dysmetric than LLE noted on LE RAMS and heel-to-shin assessments.  EDEMA:  None noted in BLE  MUSCLE  TONE: None noted in BLE during functional tasks.  POSTURE: No Significant postural limitations  LOWER EXTREMITY ROM:     Active  Right Eval Left Eval  Hip flexion Grossly WFL  Hip extension   Hip abduction   Hip adduction   Hip internal rotation   Hip external rotation   Knee flexion   Knee extension   Ankle dorsiflexion   Ankle plantarflexion   Ankle inversion    Ankle eversion     (Blank rows = not tested)  LOWER EXTREMITY MMT:    MMT Right Eval Left Eval  Hip flexion 4/5 4+/5  Hip extension    Hip abduction    Hip adduction    Hip internal rotation    Hip external rotation    Knee flexion    Knee extension 4+/5 5/5  Ankle dorsiflexion 3+/5 4+/5  Ankle plantarflexion    Ankle inversion    Ankle eversion    (Blank rows = not tested)  BED MOBILITY:  Sit to supine Complete Independence Supine to sit Complete Independence  TRANSFERS: Assistive device utilized: None  Sit to stand: Complete Independence Stand to sit: Complete Independence Chair to chair: Complete Independence  GAIT: Gait pattern: decreased arm swing- Right, decreased arm swing- Left, decreased stride length, decreased hip/knee flexion- Right, and decreased ankle dorsiflexion- Right Distance walked: various clinic distances Assistive device utilized: None Level of assistance: SBA Comments: Intermittent right knee hyperextension and steppage gait to compensate for foot drop.  FUNCTIONAL TESTS:  5 times sit to stand: 10.75 seconds w/ intermittent LUE support Functional gait assessment: 19/30    PATIENT SURVEYS:  None relevant to CC.  TODAY'S TREATMENT:                                                                                                                               DATE: 01/01/2023  Screening for Suicide  Answer the following questions with Yes or No and place an "x" beside the action taken.  1. Over the past two weeks, have you felt down, depressed, or hopeless?    "I go up and down all the time, but when I see someone like you I automatically feel up.  I am feeling kind of down right now though like something triggered me when I got here."  2. Within the past two weeks, have you felt little interest or pleasure in life?   "I have to force myself to do everything." If YES to either #1 or #2, then ask #3  3. Have you had thoughts that that life is not worth living or that you might be       better off dead?    "I fantasize about walking off a cliff."  If answer is NO and suspicion is low, then end   4. Over this past week, have you had any thoughts about hurting or even killing yourself?   Patient states no to other means of  hurting himself, but has recently thought about walking off high surfaces. If NO, then end. Patient in no immediate danger   5. If so, do you believe that you intend to or will harm yourself?   "No, I just think about it which actually just makes it worse."  "I've never tried to hurt myself."    If NO, then end. Patient in no immediate danger   6.  Do you have a plan as to how you would hurt yourself?   Patient states that he would have to drive a day and a half to the mountains in [place] in order to walk off the mountain and he does not have the energy because of his MS.  7.  Over this past week, have you actually done anything to hurt yourself?  No  IF YES answers to either #4, #5, #6 or #7, then patient is AT RISK for suicide   Actions Taken  ____  Screening negative; no further action required  ____  Screening positive; no immediate danger and patient already in treatment with a  mental health provider. Advise patient to speak to their mental health provider.  _X_   Screening positive; no immediate danger. Patient advised to contact a mental  health provider for further assessment.   ____  Screening positive; in immediate danger as patient states intention of killing self,  has plan and a sense of imminence. Do not leave alone. Seek permission from  patient to contact a family member to inform them. Direct patient to go to ED.   PT provides therapeutic listening as pt voices concerns over loneliness and factors affecting it. PT provides recommendations of seeing his therapist and discussing ongoing sleep disturbance and depression symptoms.  Pt is reluctant to do this initially.  PATIENT EDUCATION: Education details:  PT recommending immediate contact with his established counselor.  Pt states he can not get in to see her until his scheduled appt next Wednesday.  PT provides alternative option of going to outpatient behavioral health.  Pt agrees after prolonged period of consideration.   Person educated: Patient Education method: Explanation Education comprehension: verbalized understanding  HOME EXERCISE PROGRAM: Access Code: [email or web address] URL: [email or web address] Date: 12/02/2022 Prepared by: Camille Bal  Exercises - Seated Ankle Alphabet  - 1 x daily - 7 x weekly - 3 sets - 10 reps - Seated Heel Toe Raises  - 1 x daily - 5 x weekly - 3 sets - 4-5 reps - 3 seconds each hold - Ankle Dorsiflexion with Resistance  - 1 x daily - 5 x weekly - 1-2 sets - 10 reps - Staggered Sit-to-Stand  - 1 x daily - 7 x weekly - 2 sets - 10 reps - Squat with Chair Touch  - 1 x daily - 7 x weekly - 2 sets - 10 reps - Lunge with Counter Support  - 1 x daily - 7 x weekly - 2 sets - 10 reps  GOALS: Goals reviewed with patient? Yes  SHORT TERM GOALS: Target date: 12/27/2022  Pt will be independent with initial strength and balance HEP to address right LE weakness and foot drop and maintain progress from PT. Baseline:  Pt intermittently compliant, he  is independent (5/20) Goal status: MET  2.  Pt will improve FGA score to >/=23/30 in order to demonstrate improved balance and decreased fall risk. Baseline: 19/30 Goal status: INITIAL  3.  Pt will trial right foot-up brace to assess benefit to safe walking mechanics and  managing foot-drop with purchase as desired. Baseline: Pt not open to option at this time. (5/20) Goal status: REVISED-D/C'd  LONG TERM GOALS: Target date: 01/24/2023  Pt will report adherence to walking program x3 days or more per week in order to maintain BLE strength and to continue practicing safe walking mechanics. Baseline: To be established. Goal status: INITIAL  2.  Pt will improve FGA score to >/=27/30 in order to demonstrate improved balance and decreased fall risk. Baseline: 19/30 Goal status: INITIAL  3.  AFO to be trialed with order placed as appropriate if pt is agreeable and foot-up brace is not a better option. Baseline: To be assessed. Goal status: INITIAL  ASSESSMENT:  CLINICAL IMPRESSION: Treatment not completed this visit due to patient's current mental health status.  He appears greatly affected by his depression today based on subjective report.  Suicide screening completed with recommendations made accordingly.  Patient escorted to behavioral health hospital for evaluation as outpatient walk-in counseling hours were over.  PT allows patient to begin check-in process before returning to clinic.    OBJECTIVE IMPAIRMENTS: Abnormal gait, decreased balance, decreased coordination, and decreased strength.   ACTIVITY LIMITATIONS: stairs and locomotion level  PARTICIPATION LIMITATIONS: driving and community activity  PERSONAL FACTORS: Behavior pattern, Past/current experiences, Time since onset of injury/illness/exacerbation, and 1-2 comorbidities: Autism-sensory intolerances limiting bracing options, MS  are also affecting patient's functional outcome.   REHAB POTENTIAL: Excellent  CLINICAL  DECISION MAKING: Stable/uncomplicated  EVALUATION COMPLEXITY: Low  PLAN:  PT FREQUENCY: 1x/week  PT DURATION: 8 weeks  PLANNED INTERVENTIONS: Therapeutic exercises, Therapeutic activity, Neuromuscular re-education, Balance training, Gait training, Patient/Family education, Self Care, Joint mobilization, Stair training, Vestibular training, Orthotic/Fit training, DME instructions, and Re-evaluation  PLAN FOR NEXT SESSION:  How is he doing following last visit?  Modifiy HEP prn for RLE NMR/strength/foot drop.  Trial AFO for grass and long distances.  Establish walking program.  Finish FGA.   Sadie Haber, PT, DPT 01/01/2023, 12:36 PM

## 2023-01-02 DIAGNOSIS — I10 Essential (primary) hypertension: Secondary | ICD-10-CM | POA: Diagnosis not present

## 2023-01-02 DIAGNOSIS — E291 Testicular hypofunction: Secondary | ICD-10-CM | POA: Diagnosis not present

## 2023-01-03 ENCOUNTER — Telehealth: Payer: Self-pay | Admitting: Physical Therapy

## 2023-01-03 NOTE — Telephone Encounter (Signed)
PT spoke to Dr. Donnamarie Poag (pt PCP) nurse at [facility] in regards to recent pt status for continuum of care.  Camille Bal, PT, DPT

## 2023-01-06 ENCOUNTER — Other Ambulatory Visit: Payer: Self-pay

## 2023-01-06 ENCOUNTER — Telehealth: Payer: Self-pay | Admitting: Neurology

## 2023-01-06 ENCOUNTER — Ambulatory Visit: Payer: PPO | Attending: Neurology | Admitting: Occupational Therapy

## 2023-01-06 ENCOUNTER — Ambulatory Visit: Payer: PPO | Admitting: Physical Therapy

## 2023-01-06 ENCOUNTER — Encounter: Payer: Self-pay | Admitting: Occupational Therapy

## 2023-01-06 DIAGNOSIS — G35 Multiple sclerosis: Secondary | ICD-10-CM | POA: Insufficient documentation

## 2023-01-06 DIAGNOSIS — R2681 Unsteadiness on feet: Secondary | ICD-10-CM | POA: Diagnosis not present

## 2023-01-06 DIAGNOSIS — Z9181 History of falling: Secondary | ICD-10-CM | POA: Diagnosis not present

## 2023-01-06 DIAGNOSIS — R278 Other lack of coordination: Secondary | ICD-10-CM | POA: Diagnosis not present

## 2023-01-06 DIAGNOSIS — R531 Weakness: Secondary | ICD-10-CM | POA: Insufficient documentation

## 2023-01-06 DIAGNOSIS — M6281 Muscle weakness (generalized): Secondary | ICD-10-CM | POA: Insufficient documentation

## 2023-01-06 DIAGNOSIS — R2689 Other abnormalities of gait and mobility: Secondary | ICD-10-CM | POA: Diagnosis not present

## 2023-01-06 NOTE — Therapy (Unsigned)
OUTPATIENT OCCUPATIONAL THERAPY NEURO EVALUATION  Patient Name: George [name] MRN: [id number] DOB:1970/09/27, 52 y.o., male Today's Date: 01/06/2023  PCP: Emilio Aspen, MD REFERRING PROVIDER: Asa Lente, MD  END OF SESSION:  OT End of Session - 01/06/23 0934     Visit Number 1    Number of Visits 9    Authorization Type [employer] PPO: Follows Medicare guidelines    Progress Note Due on Visit 9    OT Start Time 0935    OT Stop Time 1015    OT Time Calculation (min) 40 min    Activity Tolerance Patient tolerated treatment well    Behavior During Therapy WFL for tasks assessed/performed             Past Medical History:  Diagnosis Date   Anxiety    Asperger's syndrome    Autistic spectrum disorder    Asperger's (per H&P Olivia Clelland, PA)   Bipolar 1 disorder (HCC)    "no mood swings in 20 years"  on medication   GERD (gastroesophageal reflux disease)    Head injury 2020   Hypertension    Hypothyroidism    IBS (irritable bowel syndrome)    Past Surgical History:  Procedure Laterality Date   NO PAST SURGERIES     RADIOLOGY WITH ANESTHESIA N/A 05/16/2020   Procedure: MRI BRAIN WITH AND WITHOUT CONTRAST;  Surgeon: Radiologist, Medication, MD;  Location: [facility] OR;  Service: Radiology;  Laterality: N/A;   RADIOLOGY WITH ANESTHESIA N/A 08/29/2020   Procedure: MRI WITH ANESTHESIA CERVICAL SPINE WITH AND WITHOUT CONTRAST, THORACIC SPINE WITH AND WITHOUT CONTRAST;  Surgeon: Radiologist, Medication, MD;  Location: [facility] OR;  Service: Radiology;  Laterality: N/A;   RADIOLOGY WITH ANESTHESIA N/A 03/15/2021   Procedure: MRI BRAIN WITH AND WITHOUT, CERIVAL WITH AND WITHOUT;  Surgeon: Radiologist, Medication, MD;  Location: [facility] OR;  Service: Radiology;  Laterality: N/A;   Patient Active Problem List   Diagnosis Date Noted   Depression 01/01/2023   Bipolar disease, chronic (HCC) 06/20/2021   Slurred speech 02/26/2021   Autism 12/20/2020   Multiple sclerosis  (HCC) 10/11/2020   MRI of brain abnormal 10/11/2020   Right sided weakness 10/11/2020   Gait disturbance 10/11/2020   Asperger syndrome 03/19/2013    ONSET DATE: 12/16/2022  REFERRING DIAG: R53.1 (ICD-10-CM) - Right sided weakness  G35 (ICD-10-CM) - Multiple sclerosis (HCC)  THERAPY DIAG:  Other lack of coordination  Muscle weakness (generalized)  Rationale for Evaluation and Treatment: Rehabilitation  SUBJECTIVE:   SUBJECTIVE STATEMENT:  "I can't do a thing with my right hand."   Patient reported that his medication Amlodipine was increased less than a month ago and has been having some tremors early in the morning and sometimes during the day. He has a regular MD appt June 20th but was going to walk to the Dr's office next door after this appt to schedule an neuro follow-up due to his concern about tremors and Parkinson-like issues.  He reports having mental depression and went to outpatient [facility] from PT last week.   Pt accompanied by: self  PERTINENT HISTORY:   "MS HISTORY: He has a h/o Asperger syndrome and bipolar disease.   In 2012, he had an MRI of the brain and was found to have multiple T2/FLAIR hyperintense foci in the brain.   A lumbar puncture at the time was negative.    Over the last year, he began to have more difficulty with right hand coordination  and left foot drop.    Also the right side of his face was drooping some.     He came in to see Dr. Marjory Lies and had repeat imaging studies showing progression of the white matter foci and the appearance of a small focus at C5.    Repeat LP, however, showed negative CSF (no OCB) again.   He started glatiramer 10/2020."  Patient has been receiving PT services and primary therapist recommended OT evaluation due to R UE deficits.   PRECAUTIONS: Fall  WEIGHT BEARING RESTRICTIONS: No  PAIN:  Are you having pain? No  FALLS: Has patient fallen in last 6 months? No  LIVING ENVIRONMENT: Lives with:  lives alone Lives in: House/apartment Stairs: Yes: External: 2 steps; on right going up (which is his bad hand) but he also has a ramp he can use with 2 rails Has following equipment at home: Grab bars and Ramped entry  PLOF: Independent with basic ADLs, Independent with gait, and still driving.  Reports considering paying to have groceries delivered  PATIENT GOALS: Want to be able to use his hand better ie) typing, playing piano (but does report that this that might be unrealistic).  OBJECTIVE:   HAND DOMINANCE: Left  ADLs: Overall ADLs: Patient lives alone and manages his own ADLs Transfers/ambulation related to ADLs: MI - PT has been wondering about an AFO for safety without mobility on uneven surfaces. Eating: MI Grooming: MI UB Dressing: MI - difficulty with buttons  LB Dressing: MI Toileting: MI  Bathing: MI Tub Shower transfers: MI Equipment: Grab bars  IADLs: Shopping: MI but is interested in getting information about getting delivery services Light housekeeping: performs himself but reports his home is dusty Meal Prep: [employer] Financial mobility: MI with driving and ambulates without AE Medication management: MI Financial management: Independent Handwriting:  L handed  MOBILITY STATUS: Independent and difficulty carrying objections with ambulation  POSTURE COMMENTS:  rounded shoulders and forward head Sitting balance: No deficits  ACTIVITY TOLERANCE: Activity tolerance: Fair - patient reports   FUNCTIONAL OUTCOME MEASURES: Quick Dash: 40.9  UPPER EXTREMITY ROM:    Active ROM Right eval Left eval  Shoulder flexion Decreased end range WFL  Shoulder abduction Uncomfortable > 90   Shoulder adduction    Shoulder extension    Shoulder internal rotation Uncomfortable    Shoulder external rotation    Elbow flexion    Elbow extension    Wrist flexion    Wrist extension    Wrist ulnar deviation    Wrist radial deviation    Wrist pronation    Wrist supination  Limited end range   (Blank rows = not tested)  UPPER EXTREMITY MMT:     MMT Right eval Left eval  Shoulder flexion 4-/5 5/5  Shoulder abduction    Shoulder adduction    Shoulder extension    Shoulder internal rotation    Shoulder external rotation    Middle trapezius    Lower trapezius    Elbow flexion    Elbow extension    Wrist flexion    Wrist extension    Wrist ulnar deviation    Wrist radial deviation    Wrist pronation    Wrist supination    (Blank rows = not tested)  HAND FUNCTION:  2 Trials conducted Grip strength: Right: 34.3 - 42.5  lbs; Left: 102 -104  lbs  COORDINATION: Box and Blocks:  Right 26 blocks, Left 57 blocks  SENSATION: Light touch: Impaired Some tingling in  palmar surface  EDEMA: slight  MUSCLE TONE: RUE: tremors and LUE: Within functional limits  COGNITION: Overall cognitive status: Within functional limits for tasks assessed  VISION: Subjective report: He does reports some difficulty with L eye  Baseline vision:  WFL Visual history: NA  VISION ASSESSMENT: Not tested  Patient has difficulty with some vision on L side by self report.  PERCEPTION: WFL  PRAXIS: WFL  OBSERVATIONS: Patient is a pleasant man with obvious R UE limitations in strength and coordination.  He is L handed but B tasks are challenging and is experiencing more tremors in R UE.  He is motivated to improve UE skills but worried about his symptoms - even concerned that they are indicative of Parkinson's and that his depression is a problem too but more situational than chemical as he says he feels better when he is talking to someone.   TODAY'S TREATMENT:                                                                                                                               Introduced Seated Shoulder Flexion Towel Slide at Table Top with patient encouraged to complete shoulder ROM daily - 3 sets - 10 reps  PATIENT EDUCATION: Education details: table top  slides, OT POC and treatment plans Person educated: Patient Education method: Explanation, Demonstration, and Handouts Education comprehension: verbalized understanding and needs further education  HOME EXERCISE PROGRAM: Access Code: [email or web address] URL: [email or web address] Date: 01/06/2023 Prepared by: Amada Kingfisher  Exercises - Seated Shoulder Flexion Towel Slide at Table Top  - 1 x daily - 3 sets - 10 reps   GOALS: Goals reviewed with patient? Yes  SHORT TERM GOALS: Target date: 02/07/23  Patient will demonstrate at least 45 lbs L grip strength as needed to open jars and other containers.  Baseline:38.4 lbs average Goal status: INITIAL  2.  Pt will be able to place at least 30 blocks using right hand with completion of Box and Blocks test.  Baseline: 26 blocks Goal status: INITIAL  3.  Pt will be aware of AE options to help with ADLs ie) washing back, buttons, cutting food etc. Baseline: In need of education re: modifications for areas of deficit Goal status: INITIAL  4.  Pt will be able to carry 5 lbs with R hand with modified grasp/hold technique to carry groceries etc indoors. Baseline: Uses L hand and tires easily Goal status: INITIAL  5.  Patient will demonstrate coordination/strength HEP with 25% verbal cues or less for proper execution.  Baseline: No UE HEP Goal status: INITIAL   LONG TERM GOALS: Target date: 03/07/23  Patient will demonstrate at least 50 lbs R grip strength as needed to open jars and other containers.  Baseline: 38.4 lbs average Goal status: INITIAL  2.  Pt will be able to place at least 35 blocks using right hand with completion of Box and Blocks test.  Baseline: 26 blocks Goal status:  INITIAL  3.  Patient will demonstrate independent HEP with no cues. Baseline: No UE HEP Goal status: INITIAL  4.  Patient will demonstrate at least 10% improvement with quick Dash score (reporting 30% disability or less) indicating improved  functional use of affected extremity.  Baseline: 40.9 point Goal status: INITIAL  5.  Patient will recall 3 energy conservation ideas and have access to Patient Education handouts for energy conservation techniques. Baseline: None in place Goal status: INITIAL   ASSESSMENT:  CLINICAL IMPRESSION: Patient is a 52 y.o. male who was seen today for occupational therapy evaluation for R UE deficits as a results of Multiple Sclerosis. He has obvious R grip strength limitations and coordination deficits for small objects ie) pegs versus blocks for testing activity.  He will benefit from skilled OT services to increase strength and coordination for bilateral tasks and establishing HEP for patient along with education for energy conservation and adaptive equipment to help him as needed.  PERFORMANCE DEFICITS: in functional skills including ADLs, IADLs, coordination, dexterity, sensation, tone, ROM, strength, Fine motor control, Gross motor control, and UE functional use, cognitive skills including emotional and temperament/personality, and psychosocial skills including coping strategies and interpersonal interactions.   IMPAIRMENTS: are limiting patient from ADLs, IADLs, rest and sleep, leisure, and social participation.   CO-MORBIDITIES: has co-morbidities such as MS, Asperger and depression  that affects occupational performance. Patient will benefit from skilled OT to address above impairments and improve overall function.  MODIFICATION OR ASSISTANCE TO COMPLETE EVALUATION: No modification of tasks or assist necessary to complete an evaluation.  OT OCCUPATIONAL PROFILE AND HISTORY: Problem focused assessment: Including review of records relating to presenting problem.  CLINICAL DECISION MAKING: LOW - limited treatment options, no task modification necessary  REHAB POTENTIAL: Good  EVALUATION COMPLEXITY: Low    PLAN:  OT FREQUENCY: 1x/week  OT DURATION: 8 weeks  PLANNED INTERVENTIONS:  self care/ADL training, therapeutic exercise, therapeutic activity, neuromuscular re-education, manual therapy, splinting, patient/family education, energy conservation, and coping strategies training  RECOMMENDED OTHER SERVICES: Patient is currently receiving PT services and regularly attends counseling with ongoing psych services.  May consider social stories as a coping mechanism to areas causing depression.  CONSULTED AND AGREED WITH PLAN OF CARE: Patient  PLAN FOR NEXT SESSION: Assess buttons if necessary for clothing, progress UE ROM HEP & introduce coordination activities.   Victorino Sparrow, OT 01/06/2023, 1:17 PM

## 2023-01-06 NOTE — Telephone Encounter (Signed)
Pt came into office, states his primary care just raised his  amLODipine-valsartan (EXFORGE) 5-160 MG table. Pt states he is now starting to get tremors. He states he is scared he has parkinson's disease after doing research. States tremors are only in the morning, and has also heard this drug dosage increase may be the cause of the tremors. Would like a phone call to discuss.  [phone]

## 2023-01-06 NOTE — Patient Instructions (Signed)
Table Top Shoulder Slides  Access Code: [email or web address] URL: [email or web address] Date: 01/06/2023 Prepared by: Amada Kingfisher  Exercises - Seated Shoulder Flexion Towel Slide at Table Top  - 1 x daily - 3 sets - 10 reps

## 2023-01-06 NOTE — Telephone Encounter (Signed)
Called pt back. Dose increased a month ago by PCP. Tremors started a week ago. Psychiatrist advised him med could potentially cause tremors. He has not contacted PCP yet. Recommended he call PCP to further advise. He will do this.

## 2023-01-08 ENCOUNTER — Telehealth: Payer: Self-pay | Admitting: Neurology

## 2023-01-08 DIAGNOSIS — G35 Multiple sclerosis: Secondary | ICD-10-CM | POA: Diagnosis not present

## 2023-01-08 DIAGNOSIS — R251 Tremor, unspecified: Secondary | ICD-10-CM | POA: Diagnosis not present

## 2023-01-08 DIAGNOSIS — F319 Bipolar disorder, unspecified: Secondary | ICD-10-CM | POA: Diagnosis not present

## 2023-01-08 DIAGNOSIS — I10 Essential (primary) hypertension: Secondary | ICD-10-CM | POA: Diagnosis not present

## 2023-01-08 DIAGNOSIS — R109 Unspecified abdominal pain: Secondary | ICD-10-CM | POA: Diagnosis not present

## 2023-01-08 NOTE — Telephone Encounter (Signed)
Called pt and he stated that not a major change but his right hand is closed up more and he has a hard time opening up his hand. Pt states that he saw his PCP about this and his PCP told him to call us. Pt also states that he is limping more than usual. Pt states that he has tremors but his Doctor said that it was from his medication, so his doctor lowered his dosage of his medication and he is just waiting for his new dosage to start to work on his tremors. Pt stated that he just want advice on what to do as far as his right hand is concerned. Told pt that Dr. Epimenio Foot will be notified about this matter and we will get back in contact with him. Pt verbalized understanding.

## 2023-01-08 NOTE — Telephone Encounter (Signed)
Pt called stating that he has noticed that his R hand has been cramping up a bit more than usual. Please advise.

## 2023-01-09 NOTE — Therapy (Signed)
OUTPATIENT OCCUPATIONAL THERAPY NEURO TREATMENT  Patient Name: George [name] MRN: [id number] DOB:10-30-1970, 52 y.o., male Today's Date: 01/13/2023  PCP: Emilio Aspen, MD REFERRING PROVIDER: Asa Lente, MD  END OF SESSION:  OT End of Session - 01/13/23 1019     Visit Number 2    Number of Visits 9    Date for OT Re-Evaluation 03/07/23    Authorization Type [employer] PPO: Follows Medicare guidelines    Progress Note Due on Visit 9    OT Start Time 1020    OT Stop Time 1102    OT Time Calculation (min) 42 min    Equipment Utilized During Treatment Coordination items    Activity Tolerance Patient tolerated treatment well    Behavior During Therapy WFL for tasks assessed/performed             Past Medical History:  Diagnosis Date   Anxiety    Asperger's syndrome    Autistic spectrum disorder    Asperger's (per H&P Olivia Clelland, PA)   Bipolar 1 disorder (HCC)    "no mood swings in 20 years"  on medication   GERD (gastroesophageal reflux disease)    Head injury 2020   Hypertension    Hypothyroidism    IBS (irritable bowel syndrome)    Past Surgical History:  Procedure Laterality Date   NO PAST SURGERIES     RADIOLOGY WITH ANESTHESIA N/A 05/16/2020   Procedure: MRI BRAIN WITH AND WITHOUT CONTRAST;  Surgeon: Radiologist, Medication, MD;  Location: [facility] OR;  Service: Radiology;  Laterality: N/A;   RADIOLOGY WITH ANESTHESIA N/A 08/29/2020   Procedure: MRI WITH ANESTHESIA CERVICAL SPINE WITH AND WITHOUT CONTRAST, THORACIC SPINE WITH AND WITHOUT CONTRAST;  Surgeon: Radiologist, Medication, MD;  Location: [facility] OR;  Service: Radiology;  Laterality: N/A;   RADIOLOGY WITH ANESTHESIA N/A 03/15/2021   Procedure: MRI BRAIN WITH AND WITHOUT, CERIVAL WITH AND WITHOUT;  Surgeon: Radiologist, Medication, MD;  Location: [facility] OR;  Service: Radiology;  Laterality: N/A;   Patient Active Problem List   Diagnosis Date Noted   Depression 01/01/2023   Bipolar disease,  chronic (HCC) 06/20/2021   Slurred speech 02/26/2021   Autism 12/20/2020   Multiple sclerosis (HCC) 10/11/2020   MRI of brain abnormal 10/11/2020   Right sided weakness 10/11/2020   Gait disturbance 10/11/2020   Asperger syndrome 03/19/2013    ONSET DATE: 12/16/2022  REFERRING DIAG: R53.1 (ICD-10-CM) - Right sided weakness  G35 (ICD-10-CM) - Multiple sclerosis (HCC)  THERAPY DIAG:  Other lack of coordination  Muscle weakness (generalized)  Rationale for Evaluation and Treatment: Rehabilitation  SUBJECTIVE:   SUBJECTIVE STATEMENT:  Patient reported that his medication Amlodipine was decreased last week and that he has been seeing less and less tremors.    Pt accompanied by: self  PERTINENT HISTORY:   "MS HISTORY: He has a h/o Asperger syndrome and bipolar disease.   In 2012, he had an MRI of the brain and was found to have multiple T2/FLAIR hyperintense foci in the brain.   A lumbar puncture at the time was negative.    Over the last year, he began to have more difficulty with right hand coordination and left foot drop.    Also the right side of his face was drooping some.     He came in to see Dr. Marjory Lies and had repeat imaging studies showing progression of the white matter foci and the appearance of a small focus at C5.    Repeat LP,  however, showed negative CSF (no OCB) again.   He started glatiramer 10/2020."  Patient has been receiving PT services and primary therapist recommended OT evaluation due to R UE deficits.   PRECAUTIONS: Fall  WEIGHT BEARING RESTRICTIONS: No  PAIN:  Are you having pain? No  FALLS: Has patient fallen in last 6 months? No  LIVING ENVIRONMENT: Lives with: lives alone Lives in: House/apartment Stairs: Yes: External: 2 steps; on right going up (which is his bad hand) but he also has a ramp he can use with 2 rails Has following equipment at home: Grab bars and Ramped entry  PLOF: Independent with basic ADLs, Independent with gait, and  still driving.  Reports considering paying to have groceries delivered  PATIENT GOALS: Want to be able to use his hand better ie) typing, playing piano (but does report that this that might be unrealistic).  OBJECTIVE:   HAND DOMINANCE: Left  ADLs: Overall ADLs: Patient lives alone and manages his own ADLs Transfers/ambulation related to ADLs: MI - PT has been wondering about an AFO for safety without mobility on uneven surfaces. Eating: MI Grooming: MI UB Dressing: MI - difficulty with buttons  LB Dressing: MI Toileting: MI  Bathing: MI Tub Shower transfers: MI Equipment: Grab bars  IADLs: Shopping: MI but is interested in getting information about getting delivery services Light housekeeping: performs himself but reports his home is dusty Meal Prep: [employer] mobility: MI with driving and ambulates without AE Medication management: MI Financial management: Independent Handwriting:  L handed  MOBILITY STATUS: Independent and difficulty carrying objections with ambulation  POSTURE COMMENTS:  rounded shoulders and forward head Sitting balance: No deficits  ACTIVITY TOLERANCE: Activity tolerance: Fair - patient reports   FUNCTIONAL OUTCOME MEASURES: Evaluation 01/06/23 Quick Dash: 40.9  UPPER EXTREMITY ROM:    Active ROM Right eval Left eval  Shoulder flexion Decreased end range [facility]  Shoulder abduction Uncomfortable > 90   Shoulder adduction    Shoulder extension    Shoulder internal rotation Uncomfortable    Shoulder external rotation    Elbow flexion    Elbow extension    Wrist flexion    Wrist extension    Wrist ulnar deviation    Wrist radial deviation    Wrist pronation    Wrist supination Limited end range   (Blank rows = not tested)  UPPER EXTREMITY MMT:     MMT Right eval Left eval  Shoulder flexion 4-/5 5/5  Shoulder abduction    Shoulder adduction    Shoulder extension    Shoulder internal rotation    Shoulder external rotation     Middle trapezius    Lower trapezius    Elbow flexion    Elbow extension    Wrist flexion    Wrist extension    Wrist ulnar deviation    Wrist radial deviation    Wrist pronation    Wrist supination    (Blank rows = not tested)  HAND FUNCTION:  2 Trials conducted Grip strength: Right: 34.3 - 42.5  lbs; Left: 102 -104  lbs  COORDINATION: Box and Blocks:  Right 26 blocks, Left 57 blocks  SENSATION: Light touch: Impaired Some tingling in palmar surface  EDEMA: slight  MUSCLE TONE: RUE: tremors and LUE: Within functional limits  COGNITION: Overall cognitive status: Within functional limits for tasks assessed  VISION: Subjective report: He does reports some difficulty with L eye  Baseline vision:  WFL Visual history: NA  VISION ASSESSMENT: Not tested  Patient has difficulty with some vision on L side by self report.  PERCEPTION: WFL  PRAXIS: WFL  EVALUATION OBSERVATIONS: Patient is a pleasant man with obvious R UE limitations in strength and coordination.  He is L handed but B tasks are challenging and is experiencing more tremors in R UE.  He is motivated to improve UE skills but worried about his symptoms - even concerned that they are indicative of Parkinson's and that his depression is a problem too but more situational than chemical as he says he feels better when he is talking to someone.   TODAY'S TREATMENT:                                                                                                                               Therapeutic Activities:   Coordination Exercise handout with images provided and written list of Coordination Activities provided for various activities to work on R UE finger ROM, dexterity and isolated movements with demonstration and practice, as well as modification, hand over hand guidance and cues throughout to minimize frustration.    Rotate ball in fingertips (clockwise and counter-clockwise). -- He has difficulty lifting his  fingers to turn the ball all the way around and is encouraged to rock the ball back and forth with fingers and thumb extended.  Flip cards 1 at a time. -- Setup patient to work on sorting cards for [employer], focusing on using R index finger with thumb to flip cards.   Pick up coins, dominoes, buttons, marbles, dried beans/pasta of different sizes ... To place in containers To stack -- He tends to use his middle finger to pick up items ie) dominos and is guided to work on including index finger. To pick up items one at a time until he gets 5+ in his hand and then move item from palm to fingertips to release.  -- He needed extra time, larger items and increased guidance for in hand manipulation of various objects  Twirl pen between fingers. -- He had trouble isolating fingers individually and was encouraged to use L side simultaneously to help progress activities.  Tear a piece of paper towel and roll it into small balls. -- Again, with cues and guidance to isolate index and thumb motions as he tends get frustrated by task.  He is encourage to take breaks, relax shoulder, minimize compensatory motions and a try different activities throughout the week.   PATIENT EDUCATION: Education details: Architect and Activities Person educated: Patient Education method: Explanation, Demonstration, and Handouts Education comprehension: verbalized understanding and needs further education  HOME EXERCISE PROGRAM: 01/06/23 Table Top Towel Slides Access Code: [email or web address] 01/13/23 Coordination Exercises and Activities  GOALS: Goals reviewed with patient? Yes  SHORT TERM GOALS: Target date: 02/07/23  Patient will demonstrate at least 45 lbs L grip strength as needed to open jars and other containers.  Baseline:38.4 lbs average Goal status: INITIAL  2.  Pt will  be able to place at least 30 blocks using right hand with completion of Box and Blocks test.  Baseline: 26 blocks Goal status: IN  PROGRESS  3.  Pt will be aware of AE options to help with ADLs ie) washing back, buttons, cutting food etc. Baseline: In need of education re: modifications for areas of deficit Goal status: INITIAL  4.  Pt will be able to carry 5 lbs with R hand with modified grasp/hold technique to carry groceries etc indoors. Baseline: Uses L hand and tires easily Goal status: INITIAL  5.  Patient will demonstrate coordination/strength HEP with 25% verbal cues or less for proper execution.  Baseline: No UE HEP Goal status: IN PROGRESS   LONG TERM GOALS: Target date: 03/07/23  Patient will demonstrate at least 50 lbs R grip strength as needed to open jars and other containers.  Baseline: 38.4 lbs average Goal status: INITIAL  2.  Pt will be able to place at least 35 blocks using right hand with completion of Box and Blocks test.  Baseline: 26 blocks Goal status: IN PROGRESS  3.  Patient will demonstrate independent HEP with no cues. Baseline: No UE HEP Goal status: IN PROGRESS  4.  Patient will demonstrate at least 10% improvement with quick Dash score (reporting 30% disability or less) indicating improved functional use of affected extremity.  Baseline: 40.9 point Goal status: INITIAL  5.  Patient will recall 3 energy conservation ideas and have access to Patient Education handouts for energy conservation techniques. Baseline: None in place Goal status: INITIAL   ASSESSMENT:  CLINICAL IMPRESSION:  Patient seen today for occupational therapy treatment for R UE deficits as a results of Multiple Sclerosis. He has obvious R UE  coordination deficits for small objects especially due to limited use of index finger with several activities.  He has to concentrated hard to work on isolating index finger and various activities are suggested to help him have functional reason for FM tasks ie) playing solitaire and he even suggested getting a Rubik's cube.   He appropriately took notes in his phone  of ideas of items to get to work on [employer] tasks and sought OT for feedback on different items.  He will benefit from ideas of solitary or paired activities (ie. That he could do with his father).  He will benefit from continued skilled OT services to increase strength and coordination for bilateral tasks and progress HEP for patient along with education for energy conservation and adaptive equipment to help him as needed.  PERFORMANCE DEFICITS: in functional skills including ADLs, IADLs, coordination, dexterity, sensation, tone, ROM, strength, Fine motor control, Gross motor control, and UE functional use, cognitive skills including emotional and temperament/personality, and psychosocial skills including coping strategies and interpersonal interactions.   IMPAIRMENTS: are limiting patient from ADLs, IADLs, rest and sleep, leisure, and social participation.   CO-MORBIDITIES: has co-morbidities such as MS, Asperger and depression  that affects occupational performance. Patient will benefit from skilled OT to address above impairments and improve overall function.  REHAB POTENTIAL: Good   PLAN:  OT FREQUENCY: 1x/week  OT DURATION: 8 weeks  PLANNED INTERVENTIONS: self care/ADL training, therapeutic exercise, therapeutic activity, neuromuscular re-education, manual therapy, splinting, patient/family education, energy conservation, and coping strategies training  RECOMMENDED OTHER SERVICES: Patient is currently receiving PT services and regularly attends counseling with ongoing psych services.  May consider social stories as a coping mechanism to areas causing depression.  CONSULTED AND AGREED WITH PLAN OF CARE: Patient  PLAN FOR NEXT SESSION: Assess buttons (not assessed yet) clothing,  Progress UE ROM HEP (putty) & review coordination activities.  Victorino Sparrow, OT 01/13/2023, 11:17 AM

## 2023-01-13 ENCOUNTER — Encounter: Payer: Self-pay | Admitting: Physical Therapy

## 2023-01-13 ENCOUNTER — Ambulatory Visit: Payer: PPO | Admitting: Physical Therapy

## 2023-01-13 ENCOUNTER — Ambulatory Visit: Payer: PPO | Admitting: Occupational Therapy

## 2023-01-13 DIAGNOSIS — R278 Other lack of coordination: Secondary | ICD-10-CM

## 2023-01-13 DIAGNOSIS — Z9181 History of falling: Secondary | ICD-10-CM

## 2023-01-13 DIAGNOSIS — M6281 Muscle weakness (generalized): Secondary | ICD-10-CM

## 2023-01-13 DIAGNOSIS — R2681 Unsteadiness on feet: Secondary | ICD-10-CM

## 2023-01-13 NOTE — Therapy (Unsigned)
OUTPATIENT PHYSICAL THERAPY NEURO TREATMENT/ARRIVED-NO CHARGE   Patient Name: George [name] MRN: [id number] DOB:[birth year], 52 y.o., male Today's Date: 01/13/2023   PCP: Kirby Funk, MD REFERRING PROVIDER: Asa Lente, MD  END OF SESSION:  PT End of Session - 01/13/23 1113     Visit Number 7    Number of Visits 9   8 + eval   Date for PT Re-Evaluation 01/31/23   pushed out due to scheduling preferences of patient   Authorization Type [employer]    PT Start Time 1110   PT assisting pt prior   PT Stop Time 1154    PT Time Calculation (min) 44 min    Equipment Utilized During Treatment Gait belt    Activity Tolerance Patient tolerated treatment well    Behavior During Therapy WFL for tasks assessed/performed            Past Medical History:  Diagnosis Date   Anxiety    Asperger's syndrome    Autistic spectrum disorder    Asperger's (per H&P Olivia Clelland, PA)   Bipolar 1 disorder (HCC)    "no mood swings in 20 years"  on medication   GERD (gastroesophageal reflux disease)    Head injury 2020   Hypertension    Hypothyroidism    IBS (irritable bowel syndrome)    Past Surgical History:  Procedure Laterality Date   NO PAST SURGERIES     RADIOLOGY WITH ANESTHESIA N/A 05/16/2020   Procedure: MRI BRAIN WITH AND WITHOUT CONTRAST;  Surgeon: Radiologist, Medication, MD;  Location: [facility] OR;  Service: Radiology;  Laterality: N/A;   RADIOLOGY WITH ANESTHESIA N/A 08/29/2020   Procedure: MRI WITH ANESTHESIA CERVICAL SPINE WITH AND WITHOUT CONTRAST, THORACIC SPINE WITH AND WITHOUT CONTRAST;  Surgeon: Radiologist, Medication, MD;  Location: [facility] OR;  Service: Radiology;  Laterality: N/A;   RADIOLOGY WITH ANESTHESIA N/A 03/15/2021   Procedure: MRI BRAIN WITH AND WITHOUT, CERIVAL WITH AND WITHOUT;  Surgeon: Radiologist, Medication, MD;  Location: [facility] OR;  Service: Radiology;  Laterality: N/A;   Patient Active Problem List   Diagnosis Date Noted   Depression 01/01/2023    Bipolar disease, chronic (HCC) 06/20/2021   Slurred speech 02/26/2021   Autism 12/20/2020   Multiple sclerosis (HCC) 10/11/2020   MRI of brain abnormal 10/11/2020   Right sided weakness 10/11/2020   Gait disturbance 10/11/2020   Asperger syndrome 03/19/2013    ONSET DATE: 11/18/2022 (referral date)  REFERRING DIAG: G35 (ICD-10-CM) - Multiple sclerosis R26.9 (ICD-10-CM) - Gait disturbance R53.1 (ICD-10-CM) - Right sided weakness R53.1 (ICD-10-CM) - Weakness  THERAPY DIAG:  No diagnosis found.  Rationale for Evaluation and Treatment: Rehabilitation  SUBJECTIVE:  SUBJECTIVE STATEMENT: He denies falls or near falls.  He has returned to exercising.   He addressed concerns for right hand with OT prior to PT appt.  He states the cramping and numbness in the right hand is worse than it was 6 months ago.  He states he is fine today and dismisses further mood conversation. Pt accompanied by: self - he drove himself  PERTINENT HISTORY: Autism, MS, Bipolar 1, HTN, head injury 2020  FROM MD NOTE:  "MS HISTORY: He has a h/o Asperger syndrome and bipolar disease.  In 2012, he had an MRI of the brain and was found to have multiple T2/FLAIR hyperintense foci in the brain.  A lumbar puncture at the time was negative.  Over the last year, he began to have more difficulty with right hand coordination and left foot drop.  Also the right side of his face was drooping some.  He came in to see Dr. Marjory Lies and had repeat imaging studies showing progression of the white matter foci and the appearance of a small focus at C5.  Repeat LP, however, showed negative CSF (no OCB) again.  He started glatiramer 10/2020."  PAIN:  Are you having pain? No  PRECAUTIONS: Fall  WEIGHT BEARING RESTRICTIONS: No  FALLS: Has patient  fallen in last 6 months? No  LIVING ENVIRONMENT: Lives with: lives alone Lives in: House/apartment Stairs: Yes: External: 2 steps; none Has following equipment at home: Grab bars and Ramped entry-he uses ramp w/o issue  PLOF: Independent  PATIENT GOALS: "to strengthen my ankles and for my throat" - PT to obtain ST referral.  OBJECTIVE:   DIAGNOSTIC FINDINGS: Imaging studies reviewed: MRI of the brain 11/21/2010 shows multiple T2/FLAIR hyperintense foci including a focus in the left pons, middle cerebellar peduncle, right thalamus.  Some foci of periventricular and some foci are juxtacortical.  None of the foci enhanced.   MRI of the brain 05/16/2020 showed a similar pattern at the 2012 MRI but there has been mild progression with at least 2 new lesions noted in the white matter compared to the previous MRI.  As seen before there is a large focus involving the left greater than right pons.  It may be slightly larger.  There is also involvement of the middle cerebellar peduncle which is stable on the right but a new focus on the left.  There is one stable focus in the right thalamus.   MRI of the cervical spine 08/29/2020 showed a small focus posteriorly to the right adjacent to C5.  Mild DJD at C5-C6 not causing nerve root compression.   MRI of the thoracic spine 08/29/2020 showed a normal spinal cord (subtle focus at T10-T11 more likely artifact).  There are cystic lesions within the kidneys   MRI of the brain 03/15/2021 and it was unchanged.   No new foci. IMPRESSION: Multiple supratentorial and infratentorial T2 hyperintense lesions consistent with the clinical diagnosis of multiple sclerosis. No new or enhancing lesion identified.   MRI of the cervical spine 03/15/2021 showed a focus to the right at [facility].  COGNITION: Overall cognitive status: Within functional limits for tasks assessed   SENSATION: Light touch: WFL  COORDINATION: RLE more dysmetric than LLE noted on LE RAMS and  heel-to-shin assessments.  EDEMA:  None noted in BLE  MUSCLE TONE: None noted in BLE during functional tasks.  POSTURE: No Significant postural limitations  LOWER EXTREMITY ROM:     Active  Right Eval Left Eval  Hip flexion [facility]  Hip extension   Hip abduction   Hip adduction   Hip internal rotation   Hip external rotation   Knee flexion   Knee extension   Ankle dorsiflexion   Ankle plantarflexion   Ankle inversion    Ankle eversion     (Blank rows = not tested)  LOWER EXTREMITY MMT:    MMT Right Eval Left Eval  Hip flexion 4/5 4+/5  Hip extension    Hip abduction    Hip adduction    Hip internal rotation    Hip external rotation    Knee flexion    Knee extension 4+/5 5/5  Ankle dorsiflexion 3+/5 4+/5  Ankle plantarflexion    Ankle inversion    Ankle eversion    (Blank rows = not tested)  BED MOBILITY:  Sit to supine Complete Independence Supine to sit Complete Independence  TRANSFERS: Assistive device utilized: None  Sit to stand: Complete Independence Stand to sit: Complete Independence Chair to chair: Complete Independence  GAIT: Gait pattern: decreased arm swing- Right, decreased arm swing- Left, decreased stride length, decreased hip/knee flexion- Right, and decreased ankle dorsiflexion- Right Distance walked: various clinic distances Assistive device utilized: None Level of assistance: SBA Comments: Intermittent right knee hyperextension and steppage gait to compensate for foot drop.  FUNCTIONAL TESTS:  5 times sit to stand: 10.75 seconds w/ intermittent LUE support Functional gait assessment: 19/30  [facility] PT Assessment - 01/13/23 1122       Functional Gait  Assessment   Gait assessed  Yes    Gait Level Surface Walks 20 ft in less than 7 sec but greater than 5.5 sec, uses assistive device, slower speed, mild gait deviations, or deviates 6-10 in outside of the 12 in walkway width.    Change in Gait Speed Able to change speed,  demonstrates mild gait deviations, deviates 6-10 in outside of the 12 in walkway width, or no gait deviations, unable to achieve a major change in velocity, or uses a change in velocity, or uses an assistive device.    Gait with Horizontal Head Turns Performs head turns with moderate changes in gait velocity, slows down, deviates 10-15 in outside 12 in walkway width but recovers, can continue to walk.    Gait with Vertical Head Turns Performs task with slight change in gait velocity (eg, minor disruption to smooth gait path), deviates 6 - 10 in outside 12 in walkway width or uses assistive device    Gait and Pivot Turn Pivot turns safely within 3 sec and stops quickly with no loss of balance.    Step Over Obstacle Is able to step over one shoe box (4.5 in total height) without changing gait speed. No evidence of imbalance.    Gait with Narrow Base of Support Ambulates less than 4 steps heel to toe or cannot perform without assistance.    Gait with Eyes Closed Walks 20 ft, slow speed, abnormal gait pattern, evidence for imbalance, deviates 10-15 in outside 12 in walkway width. Requires more than 9 sec to ambulate 20 ft.    Ambulating Backwards Walks 20 ft, uses assistive device, slower speed, mild gait deviations, deviates 6-10 in outside 12 in walkway width.    Steps Alternating feet, must use rail.    Total Score 17    FGA comment: 17/30 = significant fall risk              PATIENT SURVEYS:  None relevant to CC.  TODAY'S TREATMENT:  DATE: 01/13/2023 You Can Walk For A Certain Length Of Time Each Day (Can start w/ 4 days a week and work up to everyday)                          Walk 8 minutes 1-2 times per day.             Increase 2  minutes every 7 days              Work up to 20 minutes (1-2 times per day).               Example:                         Day 1-2            4-5 minutes     3 times per day                         Day 7-8           10-12 minutes 2-3 times per day                         Day 13-14       20-22 minutes 1-2 times per day   [facility] PT Assessment - 01/13/23 1122       Functional Gait  Assessment   Gait assessed  Yes    Gait Level Surface Walks 20 ft in less than 7 sec but greater than 5.5 sec, uses assistive device, slower speed, mild gait deviations, or deviates 6-10 in outside of the 12 in walkway width.    Change in Gait Speed Able to change speed, demonstrates mild gait deviations, deviates 6-10 in outside of the 12 in walkway width, or no gait deviations, unable to achieve a major change in velocity, or uses a change in velocity, or uses an assistive device.    Gait with Horizontal Head Turns Performs head turns with moderate changes in gait velocity, slows down, deviates 10-15 in outside 12 in walkway width but recovers, can continue to walk.    Gait with Vertical Head Turns Performs task with slight change in gait velocity (eg, minor disruption to smooth gait path), deviates 6 - 10 in outside 12 in walkway width or uses assistive device    Gait and Pivot Turn Pivot turns safely within 3 sec and stops quickly with no loss of balance.    Step Over Obstacle Is able to step over one shoe box (4.5 in total height) without changing gait speed. No evidence of imbalance.    Gait with Narrow Base of Support Ambulates less than 4 steps heel to toe or cannot perform without assistance.    Gait with Eyes Closed Walks 20 ft, slow speed, abnormal gait pattern, evidence for imbalance, deviates 10-15 in outside 12 in walkway width. Requires more than 9 sec to ambulate 20 ft.    Ambulating Backwards Walks 20 ft, uses assistive device, slower speed, mild gait deviations, deviates 6-10 in outside 12 in walkway width.    Steps Alternating feet, must use rail.    Total Score 17    FGA comment: 17/30 = significant fall risk            -Wall  bumps x12 w/ PT facilitating anterior chain activation w/ form -Counter heel walking w/  return demo to improve performance 6x10'  PATIENT EDUCATION: Education details:  FGA outcome.  Benefit of AFO, pt is no longer open to this option even for uneven surfaces or long distance.  Discussed elevated fall risk without AFO and MS pathology related to his foot drop.  Education on anatomy of ankle and changes related to foot drop and knee hyperextension and benefit of bracing for this.  Discussed limitations of strengthening DF muscles due to MS and locations of lesions.  Discussed HEP additions and wrote details of wall bumps on paper for pt understanding as could not find MedBridge picture.  Walking program. Person educated: Patient Education method: Explanation Education comprehension: verbalized understanding  HOME EXERCISE PROGRAM: Access Code: [email or web address] URL: [email or web address] Date: 12/02/2022 Prepared by: Camille Bal  Exercises - Seated Ankle Alphabet  - 1 x daily - 7 x weekly - 3 sets - 10 reps - Seated Heel Toe Raises  - 1 x daily - 5 x weekly - 3 sets - 4-5 reps - 3 seconds each hold - Ankle Dorsiflexion with Resistance  - 1 x daily - 5 x weekly - 1-2 sets - 10 reps - Staggered Sit-to-Stand  - 1 x daily - 7 x weekly - 2 sets - 10 reps - Squat with Chair Touch  - 1 x daily - 7 x weekly - 2 sets - 10 reps - Lunge with Counter Support  - 1 x daily - 7 x weekly - 2 sets - 10 reps - Heel Walking with Counter Support  - 1 x daily - 7 x weekly - 3 sets - 10 reps -Wall bumps for DF strengthening (wrote details on pt paper)  You Can Walk For A Certain Length Of Time Each Day                          Walk 8 minutes 1-2 times per day.             Increase 2  minutes every 7 days              Work up to 20 minutes (1-2 times per day).               Example:                         Day 1-2           4-5 minutes     3 times per day                         Day 7-8            10-12 minutes 2-3 times per day                         Day 13-14       20-22 minutes 1-2 times per day  GOALS: Goals reviewed with patient? Yes  SHORT TERM GOALS: Target date: 12/27/2022  Pt will be independent with initial strength and balance HEP to address right LE weakness and foot drop and maintain progress from PT. Baseline:  Pt intermittently compliant, he is independent (5/20) Goal status: MET  2.  Pt will improve FGA score to >/=23/30 in order to demonstrate improved balance and decreased fall risk. Baseline: 19/30; 17/30 (6/10) Goal status: NOT MET  3.  Pt will trial right foot-up  brace to assess benefit to safe walking mechanics and managing foot-drop with purchase as desired. Baseline: Pt not open to option at this time. (5/20) Goal status: REVISED-D/C'd  LONG TERM GOALS: Target date: 01/24/2023  Pt will report adherence to walking program x3 days or more per week in order to maintain BLE strength and to continue practicing safe walking mechanics. Baseline: To be established. Goal status: INITIAL  2.  Pt will improve FGA score to >/=27/30 in order to demonstrate improved balance and decreased fall risk. Baseline: 19/30; 17/30 (6/10) Goal status: NOT MET  3.  AFO to be trialed with order placed as appropriate if pt is agreeable and foot-up brace is not a better option. Baseline: To be assessed.; Pt no longer open to option for any conditions (6/10) Goal status: REVISED-D/C'd 6/10  ASSESSMENT:  CLINICAL IMPRESSION: Treatment not completed this visit due to patient's current mental health status.  He appears greatly affected by his depression today based on subjective report.  Suicide screening completed with recommendations made accordingly.  Patient escorted to behavioral health hospital for evaluation as outpatient walk-in counseling hours were over.  PT allows patient to begin check-in process before returning to clinic.    OBJECTIVE IMPAIRMENTS: Abnormal gait,  decreased balance, decreased coordination, and decreased strength.   ACTIVITY LIMITATIONS: stairs and locomotion level  PARTICIPATION LIMITATIONS: driving and community activity  PERSONAL FACTORS: Behavior pattern, Past/current experiences, Time since onset of injury/illness/exacerbation, and 1-2 comorbidities: Autism-sensory intolerances limiting bracing options, MS  are also affecting patient's functional outcome.   REHAB POTENTIAL: Excellent  CLINICAL DECISION MAKING: Stable/uncomplicated  EVALUATION COMPLEXITY: Low  PLAN:  PT FREQUENCY: 1x/week  PT DURATION: 8 weeks  PLANNED INTERVENTIONS: Therapeutic exercises, Therapeutic activity, Neuromuscular re-education, Balance training, Gait training, Patient/Family education, Self Care, Joint mobilization, Stair training, Vestibular training, Orthotic/Fit training, DME instructions, and Re-evaluation  PLAN FOR NEXT SESSION:  Modifiy HEP prn for RLE NMR/strength/foot drop.  Re-Cert!   Sadie Haber, PT, DPT 01/13/2023, 11:58 AM

## 2023-01-13 NOTE — Patient Instructions (Signed)
    Coordination Activities   Perform the following activities for 5-10 minutes 2-3 times per day with both hand(s).  Rotate ball in fingertips (clockwise and counter-clockwise). Flip cards 1 at a time as fast as you can. Deal cards with your thumb (Hold deck in hand and push card off top with thumb). Pick up coins, buttons, marbles, dried beans/pasta of different sizes and place in container. Pick up coins and place in container or coin bank. Pick up coins and stack. Pick up coins one at a time until you get 5-10 in your hand, then move coins from palm to fingertips to stack one at a time. Twirl pen between fingers. Practice writing and/or typing. Screw together nuts and bolts, then unfasten.

## 2023-01-13 NOTE — Patient Instructions (Addendum)
You Can Walk For A Certain Length Of Time Each Day                          Walk 8 minutes 1-2 times per day.             Increase 2  minutes every 7 days              Work up to 20 minutes (1-2 times per day).               Example:                         Day 1-2           4-5 minutes     3 times per day                         Day 7-8           10-12 minutes 2-3 times per day                         Day 13-14       20-22 minutes 1-2 times per day  Access Code: [email or web address] URL: [email or web address] Date: 01/13/2023 Prepared by: Camille Bal  Exercises - Seated Ankle Alphabet  - 1 x daily - 7 x weekly - 3 sets - 10 reps - Seated Heel Toe Raises  - 1 x daily - 5 x weekly - 3 sets - 4-5 reps - 3 seconds each hold - Ankle Dorsiflexion with Resistance  - 1 x daily - 5 x weekly - 1-2 sets - 10 reps - Staggered Sit-to-Stand  - 1 x daily - 7 x weekly - 2 sets - 10 reps - Squat with Chair Touch  - 1 x daily - 7 x weekly - 2 sets - 10 reps - Lunge with Counter Support  - 1 x daily - 7 x weekly - 2 sets - 10 reps - Heel Walking with Counter Support  - 1 x daily - 7 x weekly - 3 sets - 10 reps -Wall bumps for DF strengthening

## 2023-01-15 DIAGNOSIS — F319 Bipolar disorder, unspecified: Secondary | ICD-10-CM | POA: Diagnosis not present

## 2023-01-16 ENCOUNTER — Telehealth: Payer: Self-pay | Admitting: Neurology

## 2023-01-16 DIAGNOSIS — E291 Testicular hypofunction: Secondary | ICD-10-CM | POA: Diagnosis not present

## 2023-01-16 NOTE — Telephone Encounter (Signed)
Called pt to get more info. Over the years, he has noticed a very small progression of his MS. Current MS DMT: glatiramer. Wondering if medication is working and if not, if he should go on another MS DMT.  He was going to get a second opinion via another neurologist that his mother suggested. However, he spoke w/ that neurologist who recommends he discuss with Dr. Epimenio Foot. Did not have anything further to add.   Pt states physical therapy also told him they have noticed sx are worsening. He requested to come in a speak with Dr. Epimenio Foot. I scheduled work in appt for 01/23/23 at 1:30pm. Aware I will still send message to Dr. Epimenio Foot to review.

## 2023-01-16 NOTE — Telephone Encounter (Signed)
Called pt, informed him of message nurse [facility] sent. Pt said okay I will call my insurance today. Pt stated he stills needs to talk to Dr. Epimenio Foot about MS progressing.

## 2023-01-16 NOTE — Telephone Encounter (Signed)
Please call pt back. The best way to find out this info is for him to call his insurance company to see what options he has that are close to home

## 2023-01-16 NOTE — Telephone Encounter (Signed)
Pt would like to find a new PT and Occupational therapist that is close and outside of [facility]. Doesn't want to go to the one here and the only other found within [place] is too far MS is progessive. Pt is stating he needs to speak with Dr,Sater

## 2023-01-20 ENCOUNTER — Ambulatory Visit: Payer: PPO | Admitting: Physical Therapy

## 2023-01-20 ENCOUNTER — Ambulatory Visit: Payer: PPO | Admitting: Occupational Therapy

## 2023-01-20 DIAGNOSIS — M6281 Muscle weakness (generalized): Secondary | ICD-10-CM

## 2023-01-20 DIAGNOSIS — R278 Other lack of coordination: Secondary | ICD-10-CM | POA: Diagnosis not present

## 2023-01-20 NOTE — Therapy (Signed)
OUTPATIENT OCCUPATIONAL THERAPY NEURO TREATMENT  Patient Name: George [name] MRN: [id number] DOB:[birth year], 52 y.o., male Today's Date: 01/20/2023  PCP: Emilio Aspen, MD REFERRING PROVIDER: Asa Lente, MD  END OF SESSION:  OT End of Session - 01/20/23 1222     Visit Number 3    Number of Visits 9    Date for OT Re-Evaluation 03/07/23    Authorization Type [employer] PPO: Follows Medicare guidelines    Progress Note Due on Visit 9    OT Start Time 1230    OT Stop Time 1315    OT Time Calculation (min) 45 min    Equipment Utilized During Treatment Putty    Activity Tolerance Patient tolerated treatment well    Behavior During Therapy WFL for tasks assessed/performed             Past Medical History:  Diagnosis Date   Anxiety    Asperger's syndrome    Autistic spectrum disorder    Asperger's (per H&P Olivia Clelland, PA)   Bipolar 1 disorder (HCC)    "no mood swings in 20 years"  on medication   GERD (gastroesophageal reflux disease)    Head injury 2020   Hypertension    Hypothyroidism    IBS (irritable bowel syndrome)    Past Surgical History:  Procedure Laterality Date   NO PAST SURGERIES     RADIOLOGY WITH ANESTHESIA N/A 05/16/2020   Procedure: MRI BRAIN WITH AND WITHOUT CONTRAST;  Surgeon: Radiologist, Medication, MD;  Location: [facility] OR;  Service: Radiology;  Laterality: N/A;   RADIOLOGY WITH ANESTHESIA N/A 08/29/2020   Procedure: MRI WITH ANESTHESIA CERVICAL SPINE WITH AND WITHOUT CONTRAST, THORACIC SPINE WITH AND WITHOUT CONTRAST;  Surgeon: Radiologist, Medication, MD;  Location: [facility] OR;  Service: Radiology;  Laterality: N/A;   RADIOLOGY WITH ANESTHESIA N/A 03/15/2021   Procedure: MRI BRAIN WITH AND WITHOUT, CERIVAL WITH AND WITHOUT;  Surgeon: Radiologist, Medication, MD;  Location: [facility] OR;  Service: Radiology;  Laterality: N/A;   Patient Active Problem List   Diagnosis Date Noted   Depression 01/01/2023   Bipolar disease, chronic  (HCC) 06/20/2021   Slurred speech 02/26/2021   Autism 12/20/2020   Multiple sclerosis (HCC) 10/11/2020   MRI of brain abnormal 10/11/2020   Right sided weakness 10/11/2020   Gait disturbance 10/11/2020   Asperger syndrome 03/19/2013    ONSET DATE: 12/16/2022  REFERRING DIAG: R53.1 (ICD-10-CM) - Right sided weakness  G35 (ICD-10-CM) - Multiple sclerosis (HCC)  THERAPY DIAG:  Other lack of coordination  Muscle weakness (generalized)  Rationale for Evaluation and Treatment: Rehabilitation  SUBJECTIVE:   SUBJECTIVE STATEMENT:  Patient reported that he has been working hard on all of his exercises and can see some improvement in ability to pick up his foot and open his hand.  When working on putty exercises, OT related one exercise to using a key and he reported that he got a push button vehicle the last time due to difficulty managing a key with his right hand.    Pt accompanied by: self  PERTINENT HISTORY:   "MS HISTORY: He has a h/o Asperger syndrome and bipolar disease.   In 2012, he had an MRI of the brain and was found to have multiple T2/FLAIR hyperintense foci in the brain.   A lumbar puncture at the time was negative.    Over the last year, he began to have more difficulty with right hand coordination and left foot drop.    Also the  right side of his face was drooping some.     He came in to see Dr. Marjory Lies and had repeat imaging studies showing progression of the white matter foci and the appearance of a small focus at C5.    Repeat LP, however, showed negative CSF (no OCB) again.   He started glatiramer 10/2020."  Patient has been receiving PT services and primary therapist recommended OT evaluation due to R UE deficits.   PRECAUTIONS: Fall  WEIGHT BEARING RESTRICTIONS: No  PAIN:  Are you having pain? No  FALLS: Has patient fallen in last 6 months? No  LIVING ENVIRONMENT: Lives with: lives alone Lives in: House/apartment Stairs: Yes: External: 2 steps; on  right going up (which is his bad hand) but he also has a ramp he can use with 2 rails Has following equipment at home: Grab bars and Ramped entry  PLOF: Independent with basic ADLs, Independent with gait, and still driving.  Reports considering paying to have groceries delivered  PATIENT GOALS: Want to be able to use his hand better ie) typing, playing piano (but does report that this that might be unrealistic).  OBJECTIVE:   HAND DOMINANCE: Left  ADLs: Overall ADLs: Patient lives alone and manages his own ADLs Transfers/ambulation related to ADLs: MI - PT has been wondering about an AFO for safety without mobility on uneven surfaces. Eating: MI Grooming: MI UB Dressing: MI - difficulty with buttons  LB Dressing: MI Toileting: MI  Bathing: MI Tub Shower transfers: MI Equipment: Grab bars  IADLs: Shopping: MI but is interested in getting information about getting delivery services Light housekeeping: performs himself but reports his home is dusty Meal Prep: [employer] mobility: MI with driving and ambulates without AE Medication management: MI Financial management: Independent Handwriting:  L handed  MOBILITY STATUS: Independent and difficulty carrying objections with ambulation  POSTURE COMMENTS:  rounded shoulders and forward head Sitting balance: No deficits  ACTIVITY TOLERANCE: Activity tolerance: Fair - patient reports   FUNCTIONAL OUTCOME MEASURES: Evaluation 01/06/23 Quick Dash: 40.9  UPPER EXTREMITY ROM:    Active ROM Right eval Left eval  Shoulder flexion Decreased end range [facility]  Shoulder abduction Uncomfortable > 90   Shoulder adduction    Shoulder extension    Shoulder internal rotation Uncomfortable    Shoulder external rotation    Elbow flexion    Elbow extension    Wrist flexion    Wrist extension    Wrist ulnar deviation    Wrist radial deviation    Wrist pronation    Wrist supination Limited end range   (Blank rows = not tested)  UPPER  EXTREMITY MMT:     MMT Right eval Left eval  Shoulder flexion 4-/5 5/5  Shoulder abduction    Shoulder adduction    Shoulder extension    Shoulder internal rotation    Shoulder external rotation    Middle trapezius    Lower trapezius    Elbow flexion    Elbow extension    Wrist flexion    Wrist extension    Wrist ulnar deviation    Wrist radial deviation    Wrist pronation    Wrist supination    (Blank rows = not tested)  HAND FUNCTION:  2 Trials conducted Grip strength: Right: 34.3 - 42.5  lbs; Left: 102 -104  lbs  COORDINATION: Box and Blocks:  Right 26 blocks, Left 57 blocks  SENSATION: Light touch: Impaired Some tingling in palmar surface  EDEMA: slight  MUSCLE  TONE: RUE: tremors and LUE: Within functional limits  COGNITION: Overall cognitive status: Within functional limits for tasks assessed  VISION: Subjective report: He does reports some difficulty with L eye  Baseline vision:  WFL Visual history: NA  VISION ASSESSMENT: Not tested  Patient has difficulty with some vision on L side by self report.  PERCEPTION: WFL  PRAXIS: WFL  EVALUATION OBSERVATIONS: Patient is a pleasant man with obvious R UE limitations in strength and coordination.  He is L handed but B tasks are challenging and is experiencing more tremors in R UE.  He is motivated to improve UE skills but worried about his symptoms - even concerned that they are indicative of Parkinson's and that his depression is a problem too but more situational than chemical as he says he feels better when he is talking to someone.   TODAY'S TREATMENT:                                                                                                                               01/20/23  Self Care:   Fastened 3 buttons in x 42 seconds and completed fasten/unfasten in 1:21 sec  Initiated conversation re: energy conservation.  Ideas shared re: balancing daily activities and exercises ie) walking in a store  verus using electric scooter; spacing out exercises over the week; alternating activities  etc.  Will need to provide more formal education and handouts.  Therapeutic Exercises:  Initiated Putty Exercises with red (medium) putty to begin engaging and using R hand/thumb.  Patient provided visual demo, verbal and tactile cues as well as hand over hand guidance to improve performance of the following exercises:   - Putty Squeezes  - 1 x daily - 2 sets - 10 reps - cues to engaged thumb with IP extended rather than flexed, max cues/encouragement to relax shoulder and elbow and focus on gently squeezing putty into log for use with other exercises   - Tip PUSH with Putty  - 1 x daily - 2 sets - 10 reps - cues to work on opposition of thumb and finger tip with Thumb IP joint extended   - Finger Extension with Putty  - 1 x daily - 2 sets - 10 reps - patient encouraged to use thinner loops of putty to increased ease of opening putty against resistance and also encouraged to work fingers/thumb individually if needed    - 3-Point Pinch with Putty  - 1 x daily - 2 sets - 10 reps - patient encouraged to combine tripod and/or pinch of index finger with "pinch and pull" motion of putty (again progressing from tripod to individual pinch as comfort allows)  - Finger Pinch and Pull with Putty  - 1 x daily - 2 sets - 10 reps - engaged in pinching and pulling with tip to tip (index/thumb) OR tripod (thumb to both index/long fingers) and moving putty out to the side  - Key Pinch  with Putty  - 1 x daily - 2 sets - 10 reps - cues given to continue working on thumb IP extension ie) not letting thumb flex into her palm  Instruction provided on safety with putty ie) not leaving it in car and avoiding contact with furniture or clothing AND max cues/encouragement to relax shoulder and elbow and focus on gently squeezing putty.  Patient encouraged to look a mirror, count out loud and focus on smaller hand muscles rather than  bigger arm muscles.   PATIENT EDUCATION: Education details: Putty Exercises Person educated: Patient Education method: Explanation, Demonstration, and Handouts Education comprehension: verbalized understanding and needs further education  HOME EXERCISE PROGRAM: 01/06/23 Table Top Towel Slides -- Access Code: [email or web address] 01/13/23 Coordination Exercises and Activities 01/20/23 Putty Exercises -- Access Code: [email or web address]  GOALS: Goals reviewed with patient? Yes  SHORT TERM GOALS: Target date: 02/07/23  Patient will demonstrate at least 45 lbs L grip strength as needed to open jars and other containers.  Baseline:38.4 lbs average Goal status: IN PROGRESS  2.  Pt will be able to place at least 30 blocks using right hand with completion of Box and Blocks test.  Baseline: 26 blocks Goal status: IN PROGRESS  3.  Pt will be aware of AE options to help with ADLs ie) washing back, buttons, cutting food etc. Baseline: In need of education re: modifications for areas of deficit Goal status: INITIAL  4.  Pt will be able to carry 5 lbs with R hand with modified grasp/hold technique to carry groceries etc indoors. Baseline: Uses L hand and tires easily Goal status: IN PROGRESS  5.  Patient will demonstrate coordination/strength HEP with 25% verbal cues or less for proper execution.  Baseline: No UE HEP Goal status: IN PROGRESS   LONG TERM GOALS: Target date: 03/07/23  Patient will demonstrate at least 50 lbs R grip strength as needed to open jars and other containers.  Baseline: 38.4 lbs average Goal status: IN PROGRESS  2.  Pt will be able to place at least 35 blocks using right hand with completion of Box and Blocks test.  Baseline: 26 blocks Goal status: IN PROGRESS  3.  Patient will demonstrate independent HEP with no cues. Baseline: No UE HEP Goal status: IN PROGRESS  4.  Patient will demonstrate at least 10% improvement with quick Dash score (reporting 30% disability or less) indicating  improved functional use of affected extremity.  Baseline: 40.9 point Goal status: INITIAL  5.  Patient will recall 3 energy conservation ideas and have access to Patient Education handouts for energy conservation techniques. Baseline: None in place Goal status: IN PROGRESS   ASSESSMENT:  CLINICAL IMPRESSION:  Patient seen today for occupational therapy treatment for R UE deficits as a results of Multiple Sclerosis. He has been encouraged by improvements since he has been working on his coordination activities and was encourage to continue working on isolating thumb throughout putty exercises today as needed to improve functional use with FM tasks ie) playing solitaire and he even suggested getting a Rubik's cube.   He will benefit from continued skilled OT services to increase strength and coordination for bilateral tasks and progress HEP for patient along with education for energy conservation and adaptive equipment to help him as needed.  PERFORMANCE DEFICITS: in functional skills including ADLs, IADLs, coordination, dexterity, sensation, tone, ROM, strength, Fine motor control, Gross motor control, and UE functional use, cognitive skills including emotional and temperament/personality, and psychosocial skills including coping strategies and  interpersonal interactions.   IMPAIRMENTS: are limiting patient from ADLs, IADLs, rest and sleep, leisure, and social participation.   CO-MORBIDITIES: has co-morbidities such as MS, Asperger and depression  that affects occupational performance. Patient will benefit from skilled OT to address above impairments and improve overall function.  REHAB POTENTIAL: Good   PLAN:  OT FREQUENCY: 1x/week  OT DURATION: 8 weeks  PLANNED INTERVENTIONS: self care/ADL training, therapeutic exercise, therapeutic activity, neuromuscular re-education, manual therapy, splinting, patient/family education, energy conservation, and coping strategies  training  RECOMMENDED OTHER SERVICES: Patient is currently receiving PT services and regularly attends counseling with ongoing psych services.  May consider social stories as a coping mechanism to areas causing depression.  CONSULTED AND AGREED WITH PLAN OF CARE: Patient  PLAN FOR NEXT SESSION:   Review UE ROM HEP putty & review coordination activities provided in first 2 sessions.  Provided [facility] education and handouts.  Victorino Sparrow, OT 01/20/2023, 1:39 PM

## 2023-01-20 NOTE — Patient Instructions (Signed)
Putty Exercises  Access Code: [email or web address] URL: [email or web address] Date: 01/20/2023 Prepared by: Amada Kingfisher  Exercises - Putty Squeezes  - 1 x daily - 2 sets - 10 reps - 3-Point Pinch with Putty  - 1 x daily - 2 sets - 10 reps - Tip PUSH with Putty  - 1 x daily - 2 sets - 10 reps - Finger Pinch and Pull with Putty  - 1 x daily - 2 sets - 10 reps - Finger Extension with Putty  - 1 x daily - 2 sets - 10 reps - Key Pinch with Putty  - 1 x daily - 2 sets - 10 reps

## 2023-01-22 DIAGNOSIS — F319 Bipolar disorder, unspecified: Secondary | ICD-10-CM | POA: Diagnosis not present

## 2023-01-23 ENCOUNTER — Other Ambulatory Visit: Payer: Self-pay | Admitting: Neurology

## 2023-01-23 ENCOUNTER — Encounter: Payer: Self-pay | Admitting: Neurology

## 2023-01-23 ENCOUNTER — Ambulatory Visit: Payer: PPO | Admitting: Neurology

## 2023-01-23 VITALS — BP 138/96 | HR 87 | Ht 67.0 in | Wt 160.5 lb

## 2023-01-23 DIAGNOSIS — R531 Weakness: Secondary | ICD-10-CM | POA: Diagnosis not present

## 2023-01-23 DIAGNOSIS — R1310 Dysphagia, unspecified: Secondary | ICD-10-CM

## 2023-01-23 DIAGNOSIS — F319 Bipolar disorder, unspecified: Secondary | ICD-10-CM | POA: Diagnosis not present

## 2023-01-23 DIAGNOSIS — G35 Multiple sclerosis: Secondary | ICD-10-CM

## 2023-01-23 DIAGNOSIS — F840 Autistic disorder: Secondary | ICD-10-CM

## 2023-01-23 DIAGNOSIS — R269 Unspecified abnormalities of gait and mobility: Secondary | ICD-10-CM

## 2023-01-23 DIAGNOSIS — R109 Unspecified abdominal pain: Secondary | ICD-10-CM | POA: Diagnosis not present

## 2023-01-23 DIAGNOSIS — I10 Essential (primary) hypertension: Secondary | ICD-10-CM | POA: Diagnosis not present

## 2023-01-23 MED ORDER — DIAZEPAM 5 MG PO TABS: ORAL_TABLET | ORAL | 0 refills | Status: DC

## 2023-01-23 NOTE — Progress Notes (Signed)
[facility]  PATIENT: George [name] DOB: [birth year]  REFERRING DOCTOR OR PCP: PCP is George [name].  Patient has seen Dr. Marjory [name] SOURCE: Patient, mother, notes from Dr. Marjory [name], MRI imaging reports and lab reports.  MRI images personally reviewed  _________________________________   HISTORICAL  CHIEF COMPLAINT:  Chief Complaint  Patient presents with   Follow-up    Pt in room 10. Pt thinks his MS is getting worse. Pt wants to make sure he is on correct MS medication. Feels his speech is more slurred, pt just want to make sure he is doing okay. Pt said mother has noticed things getting slight worse.     HISTORY OF PRESENT ILLNESS:  George [name] is a 52 y.o. man with relapsing remitting multiple sclerosis.  Update  01/23/23: George [name] returns today, last seen one month ago.   He is on glatiramer and tolerates it well.    No exacerbation  Last MRI we did 03/15/2021 showed no new lesions.   He also had MRI brain and cervical spine at [facility]  He reports his mother and a family friend notes the walking is mildly worse.      We discussed inflammatory vs degenerative change.  He has lesions at C6 (right) and T10-T11.   T2 hyperintense lesions are seen also seen involving the right thalamus, midbrain, pons and medulla as well as bilateral middle cerebellar peduncles  Since last visit, he has done some physical and speech therapy.  Swallow evaluation was ordered due to choking on food and drink.  He feels that this is actually better now than it was earlier in the year.  The swallow study was normal.  His gait is mildly unbalanced .   He stumbles but has no falls over the last few weeks.  He has trouble on stairs.  Sometimes the right [name] will drag when he walks.  He dhas mild right hand numbness and clumsiness.   Vision is fine.  Bladder function is usually good though he has some urgency with use of caffeine.  No ED.  He is concerned about a tremor.  I did not note  a tremor on today's visit.  He was worried about Parkinson's disease.  He sees speech therapy for swallow function.   The states they feel swallow issues are more psychologic than due to MS.    He is active and tries to exercise daily.    He has some fatigue but no more than last year.     He is sleeping well most nights.    He has bipolar disease and is currently on Depakote.   He has mood swings and is quick to get angry.      He takes Vit D and B12 supplements.    MS HISTORY: He has a h/o Asperger syndrome and bipolar disease.   In 2012, he had an MRI of the brain and was found to have multiple T2/FLAIR hyperintense foci in the brain.   A lumbar puncture at the time was negative.    Over the last year, he began to have more difficulty with right hand coordination and left [name] drop.    Also the right side of his face was drooping some.     He came in to see Dr. Marjory [name] and had repeat imaging studies showing progression of the white matter foci and the appearance of a small focus at C5.    Repeat LP, however, showed negative CSF (no OCB) again.  He started glatiramer 10/2020.    OTHER MEDICAL HISTORY He has bipolar disease (sees Dr. Evelene [name]).  He also has autism.   He is on Depakote.   He notes being more emotional and easily gets overwhelmed with emotions.   This had happened when he was younger but then got better again.      He has irritable bowel syndrome and takes B12 since he was told absorption may be affected.   B12 level was fine.     Imaging studies reviewed: MRI of the brain 11/21/2010 shows multiple T2/FLAIR hyperintense foci including a focus in the left pons, middle cerebellar peduncle, right thalamus.  Some foci of periventricular and some foci are juxtacortical.  None of the foci enhanced.  MRI of the brain 05/16/2020 showed a similar pattern at the 2012 MRI but there has been mild progression with at least 2 new lesions noted in the white matter compared to the previous MRI.   As seen before there is a large focus involving the left greater than right pons.  It may be slightly larger.  There is also involvement of the middle cerebellar peduncle which is stable on the right but a new focus on the left.  There is one stable focus in the right thalamus.  MRI of the cervical spine 08/29/2020 showed a small focus posteriorly to the right adjacent to C5.  Mild DJD at C5-C6 not causing nerve root compression.  MRI of the thoracic spine 08/29/2020 showed a normal spinal cord (subtle focus at T10-T11 more likely artifact).  There are cystic lesions within the kidneys  MRI of the brain 03/15/2021 and it was unchanged.   No new foci.  MRI of the cervical spine 03/15/2021 showed a focus to the right at [facility].  Laboratory results. ANA, ANCA, HIV, hep B antibodies, hep C antibody were all negative or normal.  Vitamin D was mildly low.  Vitamin B12 was fine (elevated), creatinine was mildly elevated.  JCV antibody was positive at 1.3.  There were no oligoclonal bands in the CSF.  Protein was mildly elevated at 54.  REVIEW OF SYSTEMS: Constitutional: No fevers, chills, sweats, or change in appetite Eyes: No visual changes, double vision, eye pain Ear, nose and throat: No hearing loss, ear pain, nasal congestion, sore throat Cardiovascular: No chest pain, palpitations Respiratory:  No shortness of breath at rest or with exertion.   No wheezes GastrointestinaI: No nausea, vomiting, diarrhea, abdominal pain, fecal incontinence Genitourinary:  No dysuria, urinary retention or frequency.  No nocturia. Musculoskeletal:  No neck pain, back pain Integumentary: No rash, pruritus, skin lesions Neurological: as above Psychiatric: No depression at this time.  No anxiety Endocrine: No palpitations, diaphoresis, change in appetite, change in weigh or increased thirst Hematologic/Lymphatic:  No anemia, purpura, petechiae. Allergic/Immunologic: No itchy/runny eyes, nasal congestion, recent allergic  reactions, rashes  ALLERGIES: Allergies  Allergen Reactions   Other Other (See Comments)    Can only eat at home    HOME MEDICATIONS:  Current Outpatient Medications:    amLODipine-valsartan (EXFORGE) 5-160 MG tablet, Take 1 tablet by mouth in the morning., Disp: , Rfl:    busPIRone (BUSPAR) 10 MG tablet, Take 20 mg by mouth 2 (two) times daily., Disp: , Rfl:    Cholecalciferol (VITAMIN D3) 50 MCG (2000 UT) TABS, Take 2,000 Units by mouth in the morning., Disp: , Rfl:    COPAXONE 40 MG/ML SOSY, Inject 40mg  subcutaneously three (3) times per week, Disp: 12 mL, Rfl: 11  Cyanocobalamin (VITAMIN B-12) 5000 MCG TBDP, Place 5,000 Units under the tongue 2 (two) times a week., Disp: , Rfl:    divalproex (DEPAKOTE) 250 MG DR tablet, Take 1,250 mg by mouth daily. , Disp: , Rfl:    Multiple Vitamin (MULTIVITAMIN WITH MINERALS) TABS tablet, Take 1 tablet by mouth in the morning., Disp: , Rfl:    SYNTHROID 100 MCG tablet, Take 100 mcg by mouth daily before breakfast. , Disp: , Rfl:    testosterone enanthate (DELATESTRYL) 200 MG/ML injection, Inject 150 mg into the muscle every 14 (fourteen) days. For IM use only  Every three weeks, Disp: , Rfl:   PAST MEDICAL HISTORY: Past Medical History:  Diagnosis Date   Anxiety    Asperger's syndrome    Autistic spectrum disorder    Asperger's (per H&P Olivia Clelland, PA)   Bipolar 1 disorder (HCC)    "no mood swings in 20 years"  on medication   GERD (gastroesophageal reflux disease)    Head injury 2020   Hypertension    Hypothyroidism    IBS (irritable bowel syndrome)     PAST SURGICAL HISTORY: Past Surgical History:  Procedure Laterality Date   NO PAST SURGERIES     RADIOLOGY WITH ANESTHESIA N/A 05/16/2020   Procedure: MRI BRAIN WITH AND WITHOUT CONTRAST;  Surgeon: Radiologist, Medication, MD;  Location: [facility] OR;  Service: Radiology;  Laterality: N/A;   RADIOLOGY WITH ANESTHESIA N/A 08/29/2020   Procedure: MRI WITH ANESTHESIA CERVICAL SPINE WITH  AND WITHOUT CONTRAST, THORACIC SPINE WITH AND WITHOUT CONTRAST;  Surgeon: Radiologist, Medication, MD;  Location: [facility] OR;  Service: Radiology;  Laterality: N/A;   RADIOLOGY WITH ANESTHESIA N/A 03/15/2021   Procedure: MRI BRAIN WITH AND WITHOUT, CERIVAL WITH AND WITHOUT;  Surgeon: Radiologist, Medication, MD;  Location: [facility] OR;  Service: Radiology;  Laterality: N/A;    FAMILY HISTORY: Family History  Problem Relation Age of Onset   Hypertension Father    Depression Sister    Hypertension Paternal Grandfather     SOCIAL HISTORY:  Social History   Socioeconomic History   Marital status: Single    Spouse name: Not on file   Number of children: 0   Years of education: college   Highest education level: Not on file  Occupational History    Comment: artist  Tobacco Use   Smoking status: Never   Smokeless tobacco: Never  Vaping Use   Vaping Use: Never used  Substance and Sexual Activity   Alcohol use: No   Drug use: No   Sexual activity: Not on file  Other Topics Concern   Not on file  Social History Narrative   He lives at home.    He is single and has no children.     He denies use of tobacco, alcohol, illicit drugs and caffeine.    He works as an Tree surgeon.    Chemical engineer Strain: Not on [employer] Insecurity: Not on file  Transportation Needs: Not on file  Physical Activity: Not on file  Stress: Not on file  Social Connections: Not on file  Intimate Partner Violence: Not on file     PHYSICAL EXAM  Vitals:   01/23/23 1318 01/23/23 1325  BP: (!) 151/89 (!) 138/96  Pulse: 89 87  Weight: 160 lb 8 oz (72.8 kg)   Height: 5\' 7"  (1.702 m)     Body mass index is 25.14 kg/m.   General: The patient is well-developed and well-nourished  and in no acute distress  HEENT:  Head is [place]/AT.  Sclera are anicteric.   Neck:  The neck is nontender.  Skin: Extremities are without rash or  edema.   Neurologic Exam  Mental status: The  patient is alert and oriented x 3 at the time of the examination. The patient has apparent normal recent and remote memory, with an apparently normal attention span and concentration ability.   Speech is normal.  Cranial nerves: Extraocular movements are full.   There is mild weakness in the right lower face  Facial sensation is normal.     Trapezius and sternocleidomastoid strength is normal. No dysarthria is noted.    No obvious hearing deficits are noted.  Motor: I did not note a tremor.  Muscle bulk is normal.   Tone is mildly increased on the right. Strength is  5 / 5 in left side, 4+/5 right arm (reduced RAM)  4+/5 right  leg.    Sensory: Sensory testing is intact to pinprick, soft touch and vibration sensation in arms and slightly reduced vibraiton in right leg.  Coordination: Cerebellar testing reveals mildly reduced coordination right hand and [name].   Reduced heel-to-shin.  Gait and station: Station is normal.   He has a right [name] drop.  The tandem gait is wide.   Romberg is negative.   Reflexes: Deep tendon reflexes are symmetric and normal in arms and increased right leg.        DIAGNOSTIC DATA (LABS, IMAGING, TESTING) - I reviewed patient records, labs, notes, testing and imaging myself where available.  Lab Results  Component Value Date   WBC 7.1 09/11/2020   HGB 17.4 09/11/2020   HCT 52.4 (H) 09/11/2020   MCV 85 09/11/2020   PLT 206 09/11/2020      Component Value Date/Time   NA 138 03/15/2021 0934   NA 142 09/11/2020 1035   K 4.2 03/15/2021 0934   CL 104 03/15/2021 0934   CO2 26 03/15/2021 0934   GLUCOSE 87 03/15/2021 0934   BUN 14 03/15/2021 0934   BUN 16 09/11/2020 1035   CREATININE 1.22 03/15/2021 0934   CREATININE 1.14 03/04/2014 1310   CALCIUM 9.2 03/15/2021 0934   PROT 7.3 09/11/2020 1035   ALBUMIN 4.6 09/11/2020 1035   AST 16 09/11/2020 1035   ALT 20 09/11/2020 1035   ALKPHOS 67 09/11/2020 1035   BILITOT 0.3 09/11/2020 1035   GFRNONAA >60  03/15/2021 0934   GFRAA 72 09/11/2020 1035   No results found for: "CHOL", "HDL", "LDLCALC", "LDLDIRECT", "TRIG", "CHOLHDL" Lab Results  Component Value Date   HGBA1C 5.4 09/11/2020   Lab Results  Component Value Date   VITAMINB12 >2000 (H) 09/11/2020   Lab Results  Component Value Date   TSH 1.400 09/11/2020       ASSESSMENT AND PLAN  Multiple sclerosis (HCC) - Plan: George THORACIC SPINE W WO CONTRAST, CANCELED: George BRAIN W WO CONTRAST, CANCELED: George CERVICAL SPINE W WO CONTRAST  Right sided weakness - Plan: George THORACIC SPINE W WO CONTRAST, CANCELED: George BRAIN W WO CONTRAST, CANCELED: George CERVICAL SPINE W WO CONTRAST  Gait disturbance - Plan: George THORACIC SPINE W WO CONTRAST, CANCELED: George BRAIN W WO CONTRAST, CANCELED: George CERVICAL SPINE W WO CONTRAST  Autism  Dysphagia, unspecified type   Continue glatiramer for now.  However, due to him noticing more issues with his gait we will check an MRI of the thoracic spine to determine if there has been any new  activity that might explain his symptoms..   We went into a lot of detail today about the purpose of the disease modifying therapies.  We also went over natural history of MS going from a more inflammatory disease with relapses and MRI changes to a degenerative disease with progressive impairments in the absence of new MRI activity. I reviewed his MRIs from [facility] November 2023 and compared them to the MRIs from 2022.  There did not appear to be any new activity.  However, due to the new symptoms we will check an MRI of the thoracic spine.  His previous MRIs from several years ago 2.   Stay active and exercie as tolerated. 3.  He was concerned about a tremor.  I did not note a tremor on examination and did not see any evidence of parkinsonism.   4.   Continue vit D supplements 5.   Return to see [place] in 6 months or sooner for new or worsening neurologic symptoms.      45-minute office visit with the majority of the time spent  face-to-face for history and physical, discussion/counseling and decision-making.  Additional time with record review and documentation.  This visit is part of a comprehensive longitudinal care medical relationship regarding the patients primary diagnosis of multiple sclerosis and related concerns.      George Concepcion A. Epimenio Foot, MD, [facility] 01/23/2023, 2:00 PM Certified in Neurology, Clinical Neurophysiology, Sleep Medicine and Neuroimaging  [facility] [address] [place], [place] [address] [phone]

## 2023-01-27 ENCOUNTER — Ambulatory Visit: Payer: PPO

## 2023-01-27 ENCOUNTER — Ambulatory Visit: Payer: PPO | Admitting: Occupational Therapy

## 2023-01-27 DIAGNOSIS — R278 Other lack of coordination: Secondary | ICD-10-CM

## 2023-01-27 DIAGNOSIS — R2681 Unsteadiness on feet: Secondary | ICD-10-CM

## 2023-01-27 DIAGNOSIS — G35D Multiple sclerosis, unspecified: Secondary | ICD-10-CM

## 2023-01-27 DIAGNOSIS — M6281 Muscle weakness (generalized): Secondary | ICD-10-CM

## 2023-01-27 DIAGNOSIS — R2689 Other abnormalities of gait and mobility: Secondary | ICD-10-CM

## 2023-01-27 DIAGNOSIS — G35 Multiple sclerosis: Secondary | ICD-10-CM

## 2023-01-27 DIAGNOSIS — Z9181 History of falling: Secondary | ICD-10-CM

## 2023-01-27 NOTE — Therapy (Signed)
OUTPATIENT PHYSICAL THERAPY NEURO TREATMENT   Patient Name: George [name] MRN: [id number] DOB:[birth year], 52 y.o., male Today's Date: 01/27/2023  PCP: Kirby Funk, MD REFERRING PROVIDER: Asa Lente, MD  END OF SESSION:   PT End of Session - 01/27/23 1244     Visit Number 8    Number of Visits 9   8 + eval   Date for PT Re-Evaluation 01/31/23   pushed out due to scheduling preferences of patient   Authorization Type [employer]    PT Start Time 1105    PT Stop Time 1135    PT Time Calculation (min) 30 min    Equipment Utilized During Treatment Gait belt    Activity Tolerance Patient tolerated treatment well    Behavior During Therapy WFL for tasks assessed/performed                  Past Medical History:  Diagnosis Date   Anxiety    Asperger's syndrome    Autistic spectrum disorder    Asperger's (per H&P Olivia Clelland, PA)   Bipolar 1 disorder (HCC)    "no mood swings in 20 years"  on medication   GERD (gastroesophageal reflux disease)    Head injury 2020   Hypertension    Hypothyroidism    IBS (irritable bowel syndrome)    Past Surgical History:  Procedure Laterality Date   NO PAST SURGERIES     RADIOLOGY WITH ANESTHESIA N/A 05/16/2020   Procedure: MRI BRAIN WITH AND WITHOUT CONTRAST;  Surgeon: Radiologist, Medication, MD;  Location: [facility] OR;  Service: Radiology;  Laterality: N/A;   RADIOLOGY WITH ANESTHESIA N/A 08/29/2020   Procedure: MRI WITH ANESTHESIA CERVICAL SPINE WITH AND WITHOUT CONTRAST, THORACIC SPINE WITH AND WITHOUT CONTRAST;  Surgeon: Radiologist, Medication, MD;  Location: [facility] OR;  Service: Radiology;  Laterality: N/A;   RADIOLOGY WITH ANESTHESIA N/A 03/15/2021   Procedure: MRI BRAIN WITH AND WITHOUT, CERIVAL WITH AND WITHOUT;  Surgeon: Radiologist, Medication, MD;  Location: [facility] OR;  Service: Radiology;  Laterality: N/A;   Patient Active Problem List   Diagnosis Date Noted   Depression 01/01/2023   Bipolar disease, chronic  (HCC) 06/20/2021   Slurred speech 02/26/2021   Autism 12/20/2020   Multiple sclerosis (HCC) 10/11/2020   MRI of brain abnormal 10/11/2020   Right sided weakness 10/11/2020   Gait disturbance 10/11/2020   Asperger syndrome 03/19/2013    ONSET DATE: 11/18/2022 (referral date)  REFERRING DIAG: G35 (ICD-10-CM) - Multiple sclerosis R26.9 (ICD-10-CM) - Gait disturbance R53.1 (ICD-10-CM) - Right sided weakness R53.1 (ICD-10-CM) - Weakness  THERAPY DIAG:  No diagnosis found.  Rationale for Evaluation and Treatment: Rehabilitation  SUBJECTIVE:  SUBJECTIVE STATEMENT: Pt reports no pain. Pt reports no falls or near falls. Pt reports I am concerned about my MS getting worst and falling again.  Pt accompanied by: self - he drove himself  PERTINENT HISTORY: Autism, MS, Bipolar 1, HTN, head injury 2020  FROM MD NOTE:  "MS HISTORY: He has a h/o Asperger syndrome and bipolar disease.  In 2012, he had an MRI of the brain and was found to have multiple T2/FLAIR hyperintense foci in the brain.  A lumbar puncture at the time was negative.  Over the last year, he began to have more difficulty with right hand coordination and left foot drop.  Also the right side of his face was drooping some.  He came in to see Dr. Marjory Lies and had repeat imaging studies showing progression of the white matter foci and the appearance of a small focus at C5.  Repeat LP, however, showed negative CSF (no OCB) again.  He started glatiramer 10/2020."  PAIN:  Are you having pain? No  PRECAUTIONS: Fall  WEIGHT BEARING RESTRICTIONS: No  FALLS: Has patient fallen in last 6 months? No  LIVING ENVIRONMENT: Lives with: lives alone Lives in: House/apartment Stairs: Yes: External: 2 steps; none Has following equipment at home: Grab bars and  Ramped entry-he uses ramp w/o issue  PLOF: Independent  PATIENT GOALS: "to strengthen my ankles and for my throat" - PT to obtain ST referral.  OBJECTIVE:   DIAGNOSTIC FINDINGS: Imaging studies reviewed: MRI of the brain 11/21/2010 shows multiple T2/FLAIR hyperintense foci including a focus in the left pons, middle cerebellar peduncle, right thalamus.  Some foci of periventricular and some foci are juxtacortical.  None of the foci enhanced.   MRI of the brain 05/16/2020 showed a similar pattern at the 2012 MRI but there has been mild progression with at least 2 new lesions noted in the white matter compared to the previous MRI.  As seen before there is a large focus involving the left greater than right pons.  It may be slightly larger.  There is also involvement of the middle cerebellar peduncle which is stable on the right but a new focus on the left.  There is one stable focus in the right thalamus.   MRI of the cervical spine 08/29/2020 showed a small focus posteriorly to the right adjacent to C5.  Mild DJD at C5-C6 not causing nerve root compression.   MRI of the thoracic spine 08/29/2020 showed a normal spinal cord (subtle focus at T10-T11 more likely artifact).  There are cystic lesions within the kidneys   MRI of the brain 03/15/2021 and it was unchanged.   No new foci. IMPRESSION: Multiple supratentorial and infratentorial T2 hyperintense lesions consistent with the clinical diagnosis of multiple sclerosis. No new or enhancing lesion identified.   MRI of the cervical spine 03/15/2021 showed a focus to the right at [facility].  COGNITION: Overall cognitive status: Within functional limits for tasks assessed   SENSATION: Light touch: WFL  COORDINATION: RLE more dysmetric than LLE noted on LE RAMS and heel-to-shin assessments.  EDEMA:  None noted in BLE  MUSCLE TONE: None noted in BLE during functional tasks.  POSTURE: No Significant postural limitations  LOWER EXTREMITY ROM:      Active  Right Eval Left Eval  Hip flexion [facility]  Hip extension   Hip abduction   Hip adduction   Hip internal rotation   Hip external rotation   Knee flexion   Knee extension   Ankle  dorsiflexion   Ankle plantarflexion   Ankle inversion    Ankle eversion     (Blank rows = not tested)  LOWER EXTREMITY MMT:    MMT Right Eval Left Eval  Hip flexion 4/5 4+/5  Hip extension    Hip abduction    Hip adduction    Hip internal rotation    Hip external rotation    Knee flexion    Knee extension 4+/5 5/5  Ankle dorsiflexion 3+/5 4+/5  Ankle plantarflexion    Ankle inversion    Ankle eversion    (Blank rows = not tested)  BED MOBILITY:  Sit to supine Complete Independence Supine to sit Complete Independence  TRANSFERS: Assistive device utilized: None  Sit to stand: Complete Independence Stand to sit: Complete Independence Chair to chair: Complete Independence  GAIT: Gait pattern: decreased arm swing- Right, decreased arm swing- Left, decreased stride length, decreased hip/knee flexion- Right, and decreased ankle dorsiflexion- Right Distance walked: various clinic distances Assistive device utilized: None Level of assistance: SBA Comments: Intermittent right knee hyperextension and steppage gait to compensate for foot drop.  FUNCTIONAL TESTS:  5 times sit to stand: 10.75 seconds w/ intermittent LUE support Functional gait assessment: 19/30     01/13/23 1122  Functional Gait  Assessment  Gait assessed  Yes  Gait Level Surface 2  Change in Gait Speed 2  Gait with Horizontal Head Turns 1  Gait with Vertical Head Turns 2  Gait and Pivot Turn 3  Step Over Obstacle 2  Gait with Narrow Base of Support 0  Gait with Eyes Closed 1  Ambulating Backwards 2  Steps 2  Total Score 17  FGA comment: 17/30 = significant fall risk     PATIENT SURVEYS:  None relevant to CC.  TODAY'S TREATMENT:                                                                                                                               DATE:  - heel walk: 4 laps x 10 feet - tandem walk: 10 laps x 10 feet - Walking 115' with cues for arm swings, min A, had 2 instances of toes catching due to fatigue from exercises above. - Walking with eyes closed: 2 x 30 feet with min A -Wall bumps x12 w/ PT facilitating anterior chain activation w/ form -Counter heel walking w/ return demo to improve performance 6x10' - standing tandem balance: 3 x 10" hold R and L - Pt discussed that based on advise he saw some where that he doesn't have to use leg brace (AFO) all the time and he doesn't want to use it all the time to prevent atrophy in his legs. Pt was educated that it is somewhat true and was educated that with MS, his R LE may fatigue faster and may have to use to brace. Pt prefers to keep working on the strengthening and doesn't wish to pursue leg bracing as of  yet.  - we discussed potentially using walking sticks in community but pt wants to trial walking without walking stick at this point. - Pt educated on always keeping "reserve of energy" to limite overly fatiguing or taxing his system to aovid falls or excessive weakness.    PATIENT EDUCATION: Education details:  FGA outcome.  Benefit of AFO, pt is no longer open to this option even for uneven surfaces or long distance.  Discussed elevated fall risk without AFO and MS pathology related to his foot drop.  Education on anatomy of ankle and changes related to foot drop and knee hyperextension and benefit of bracing for this.  Discussed limitations of strengthening DF muscles due to MS and locations of lesions.  Discussed HEP additions and wrote details of wall bumps on paper for pt understanding as could not find MedBridge picture.  Walking program. Person educated: Patient Education method: Explanation Education comprehension: verbalized understanding  HOME EXERCISE PROGRAM: Access Code: [email or web address] URL:  [email or web address] Date: 12/02/2022 Prepared by: Camille Bal  Exercises - Seated Ankle Alphabet  - 1 x daily - 7 x weekly - 3 sets - 10 reps - Seated Heel Toe Raises  - 1 x daily - 5 x weekly - 3 sets - 4-5 reps - 3 seconds each hold - Ankle Dorsiflexion with Resistance  - 1 x daily - 5 x weekly - 1-2 sets - 10 reps - Staggered Sit-to-Stand  - 1 x daily - 7 x weekly - 2 sets - 10 reps - Squat with Chair Touch  - 1 x daily - 7 x weekly - 2 sets - 10 reps - Lunge with Counter Support  - 1 x daily - 7 x weekly - 2 sets - 10 reps - Heel Walking with Counter Support  - 1 x daily - 7 x weekly - 3 sets - 10 reps -Wall bumps for DF strengthening (wrote details on pt paper)  You Can Walk For A Certain Length Of Time Each Day                          Walk 8 minutes 1-2 times per day.             Increase 2  minutes every 7 days              Work up to 20 minutes (1-2 times per day).               Example:                         Day 1-2           4-5 minutes     3 times per day                         Day 7-8           10-12 minutes 2-3 times per day                         Day 13-14       20-22 minutes 1-2 times per day  GOALS: Goals reviewed with patient? Yes  SHORT TERM GOALS: Target date: 12/27/2022  Pt will be independent with initial strength and balance HEP to address right LE weakness and foot drop and maintain progress from PT. Baseline:  Pt intermittently compliant, he  is independent (5/20) Goal status: MET  2.  Pt will improve FGA score to >/=23/30 in order to demonstrate improved balance and decreased fall risk. Baseline: 19/30; 17/30 (6/10) Goal status: NOT MET  3.  Pt will trial right foot-up brace to assess benefit to safe walking mechanics and managing foot-drop with purchase as desired. Baseline: Pt not open to option at this time. (5/20) Goal status: REVISED-D/C'd  LONG TERM GOALS: Target date: 01/24/2023  Pt will report adherence to walking  program x3 days or more per week in order to maintain BLE strength and to continue practicing safe walking mechanics. Baseline: To be established. Goal status: INITIAL  2.  Pt will improve FGA score to >/=27/30 in order to demonstrate improved balance and decreased fall risk. Baseline: 19/30; 17/30 (6/10) Goal status: NOT MET  3.  AFO to be trialed with order placed as appropriate if pt is agreeable and foot-up brace is not a better option. Baseline: To be assessed.; Pt no longer open to option for any conditions (6/10) Goal status: REVISED-D/C'd 6/10  ASSESSMENT:  CLINICAL IMPRESSION:  Today's session focused on working on gait and balance components of FGA to improve overall balance with standing and walking. Pt was reporting near complete fatigue after 30 min of exercises today. We limited therapy to only 30 min to conserve energy and not excessively fatuge his LE as patient had lunch with his dad after the PT ended.  OBJECTIVE IMPAIRMENTS: Abnormal gait, decreased balance, decreased coordination, and decreased strength.   ACTIVITY LIMITATIONS: stairs and locomotion level  PARTICIPATION LIMITATIONS: driving and community activity  PERSONAL FACTORS: Behavior pattern, Past/current experiences, Time since onset of injury/illness/exacerbation, and 1-2 comorbidities: Autism-sensory intolerances limiting bracing options, MS  are also affecting patient's functional outcome.   REHAB POTENTIAL: Excellent  CLINICAL DECISION MAKING: Stable/uncomplicated  EVALUATION COMPLEXITY: Low  PLAN:  PT FREQUENCY: 1x/week  PT DURATION: 8 weeks  PLANNED INTERVENTIONS: Therapeutic exercises, Therapeutic activity, Neuromuscular re-education, Balance training, Gait training, Patient/Family education, Self Care, Joint mobilization, Stair training, Vestibular training, Orthotic/Fit training, DME instructions, and Re-evaluation  PLAN FOR NEXT SESSION:  Modifiy HEP prn for RLE NMR/strength/foot drop.   Re-Cert at next visit per pt request 6/10!   Ileana Ladd, PT, DPT 01/27/2023, 11:05 AM

## 2023-01-27 NOTE — Therapy (Unsigned)
OUTPATIENT OCCUPATIONAL THERAPY NEURO TREATMENT  Patient Name: George [name] MRN: [id number] DOB:Apr 03, 1971, 52 y.o., male Today's Date: 01/27/2023  PCP: Emilio Aspen, MD REFERRING PROVIDER: Asa Lente, MD  END OF SESSION:  OT End of Session - 01/27/23 1019     Visit Number 4    Number of Visits 9    Date for OT Re-Evaluation 03/07/23    Authorization Type [employer] PPO: Follows Medicare guidelines    Progress Note Due on Visit 9    OT Start Time 1019    OT Stop Time 1059    OT Time Calculation (min) 40 min    Activity Tolerance Patient tolerated treatment well    Behavior During Therapy WFL for tasks assessed/performed             Past Medical History:  Diagnosis Date   Anxiety    Asperger's syndrome    Autistic spectrum disorder    Asperger's (per H&P Olivia Clelland, PA)   Bipolar 1 disorder (HCC)    "no mood swings in 20 years"  on medication   GERD (gastroesophageal reflux disease)    Head injury 2020   Hypertension    Hypothyroidism    IBS (irritable bowel syndrome)    Past Surgical History:  Procedure Laterality Date   NO PAST SURGERIES     RADIOLOGY WITH ANESTHESIA N/A 05/16/2020   Procedure: MRI BRAIN WITH AND WITHOUT CONTRAST;  Surgeon: Radiologist, Medication, MD;  Location: [facility] OR;  Service: Radiology;  Laterality: N/A;   RADIOLOGY WITH ANESTHESIA N/A 08/29/2020   Procedure: MRI WITH ANESTHESIA CERVICAL SPINE WITH AND WITHOUT CONTRAST, THORACIC SPINE WITH AND WITHOUT CONTRAST;  Surgeon: Radiologist, Medication, MD;  Location: [facility] OR;  Service: Radiology;  Laterality: N/A;   RADIOLOGY WITH ANESTHESIA N/A 03/15/2021   Procedure: MRI BRAIN WITH AND WITHOUT, CERIVAL WITH AND WITHOUT;  Surgeon: Radiologist, Medication, MD;  Location: [facility] OR;  Service: Radiology;  Laterality: N/A;   Patient Active Problem List   Diagnosis Date Noted   Depression 01/01/2023   Bipolar disease, chronic (HCC) 06/20/2021   Slurred speech 02/26/2021    Autism 12/20/2020   Multiple sclerosis (HCC) 10/11/2020   MRI of brain abnormal 10/11/2020   Right sided weakness 10/11/2020   Gait disturbance 10/11/2020   Asperger syndrome 03/19/2013    ONSET DATE: 12/16/2022  REFERRING DIAG: R53.1 (ICD-10-CM) - Right sided weakness  G35 (ICD-10-CM) - Multiple sclerosis (HCC)  THERAPY DIAG:  Muscle weakness (generalized)  Other lack of coordination  MS (multiple sclerosis) (HCC)  Rationale for Evaluation and Treatment: Rehabilitation  SUBJECTIVE:   SUBJECTIVE STATEMENT:  Patient reports that he has continued to work hard on all of his exercises but continues to have trouble with his R thumb.  Pt accompanied by: self  PERTINENT HISTORY:   "MS HISTORY: He has a h/o Asperger syndrome and bipolar disease.   In 2012, he had an MRI of the brain and was found to have multiple T2/FLAIR hyperintense foci in the brain.   A lumbar puncture at the time was negative.    Over the last year, he began to have more difficulty with right hand coordination and left foot drop.    Also the right side of his face was drooping some.     He came in to see Dr. Marjory Lies and had repeat imaging studies showing progression of the white matter foci and the appearance of a small focus at C5.    Repeat LP, however, showed negative  CSF (no OCB) again.   He started glatiramer 10/2020."  Patient has been receiving PT services and primary therapist recommended OT evaluation due to R UE deficits.   PRECAUTIONS: Fall  WEIGHT BEARING RESTRICTIONS: No  PAIN:  Are you having pain? No  FALLS: Has patient fallen in last 6 months? No  LIVING ENVIRONMENT: Lives with: lives alone Lives in: House/apartment Stairs: Yes: External: 2 steps; on right going up (which is his bad hand) but he also has a ramp he can use with 2 rails Has following equipment at home: Grab bars and Ramped entry  PLOF: Independent with basic ADLs, Independent with gait, and still driving.  Reports  considering paying to have groceries delivered  PATIENT GOALS: Want to be able to use his hand better ie) typing, playing piano (but does report that this that might be unrealistic).  OBJECTIVE:   HAND DOMINANCE: Left  ADLs: Overall ADLs: Patient lives alone and manages his own ADLs Transfers/ambulation related to ADLs: MI - PT has been wondering about an AFO for safety without mobility on uneven surfaces. Eating: MI Grooming: MI UB Dressing: MI - difficulty with buttons  LB Dressing: MI Toileting: MI  Bathing: MI Tub Shower transfers: MI Equipment: Grab bars  IADLs: Shopping: MI but is interested in getting information about getting delivery services Light housekeeping: performs himself but reports his home is dusty Meal Prep: [employer] mobility: MI with driving and ambulates without AE Medication management: MI Financial management: Independent Handwriting:  L handed  MOBILITY STATUS: Independent and difficulty carrying objections with ambulation  POSTURE COMMENTS:  rounded shoulders and forward head Sitting balance: No deficits  ACTIVITY TOLERANCE: Activity tolerance: Fair - patient reports   FUNCTIONAL OUTCOME MEASURES: Evaluation 01/06/23 Quick Dash: 40.9  UPPER EXTREMITY ROM:    Active ROM Right eval Left eval  Shoulder flexion Decreased end range [facility]  Shoulder abduction Uncomfortable > 90   Shoulder adduction    Shoulder extension    Shoulder internal rotation Uncomfortable    Shoulder external rotation    Elbow flexion    Elbow extension    Wrist flexion    Wrist extension    Wrist ulnar deviation    Wrist radial deviation    Wrist pronation    Wrist supination Limited end range   (Blank rows = not tested)  UPPER EXTREMITY MMT:     MMT Right eval Left eval  Shoulder flexion 4-/5 5/5  Shoulder abduction    Shoulder adduction    Shoulder extension    Shoulder internal rotation    Shoulder external rotation    Middle trapezius    Lower  trapezius    Elbow flexion    Elbow extension    Wrist flexion    Wrist extension    Wrist ulnar deviation    Wrist radial deviation    Wrist pronation    Wrist supination    (Blank rows = not tested)  HAND FUNCTION:  2 Trials conducted Grip strength: Right: 34.3 - 42.5  lbs; Left: 102 -104  lbs  COORDINATION: Box and Blocks:  Right 26 blocks, Left 57 blocks  SENSATION: Light touch: Impaired Some tingling in palmar surface  EDEMA: slight  MUSCLE TONE: RUE: tremors and LUE: Within functional limits  COGNITION: Overall cognitive status: Within functional limits for tasks assessed  VISION: Subjective report: He does reports some difficulty with L eye  Baseline vision:  WFL Visual history: NA  VISION ASSESSMENT: Not tested  Patient has difficulty  with some vision on L side by self report.  PERCEPTION: WFL  PRAXIS: WFL  EVALUATION OBSERVATIONS: Patient is a pleasant man with obvious R UE limitations in strength and coordination.  He is L handed but B tasks are challenging and is experiencing more tremors in R UE.  He is motivated to improve UE skills but worried about his symptoms - even concerned that they are indicative of Parkinson's and that his depression is a problem too but more situational than chemical as he says he feels better when he is talking to someone.   TODAY'S TREATMENT:                                                                                                                               Self Care:  Patient engaged in compensatory tasks and education for ADLs and progression of MS as patient reports having difficulty donning LB clothes sometimes. Education and demonstration provided re: dressing weaker R LE first which significantly improved ease of dressing with simulated theraband looped to make pant legs.   Also reviewed UB dressing ideas ie) flipping front closing shirt by flipping it over his head or swinging it around his body with "big"  sweeping motions.  Reviewed AE ideas for cutting with AE, cutting fingernails with AE and other kitchen options.   Patient engaged in education RE: disease process, and options as adaptive equipment may be appropriate in the future as well as potential need for a hand splint in the future to keep digits from contracting as his medial digits already lack full AROM into extension. Patient expressed his concerns about having to wear splints all the time. Education is provided regarding using splints as needed to improve range of motion or safety i.e. with wearing a Foot splint. Patient seemed to think a previous suggestion regarding AFO meant to wear it all the time.   Also reviewed energy conservation ideas. Patient reports parking at the end of parking lot and walking into stores but then being tired. He does have a handicap parking tag but chooses to park far away for "exercise" and to avoid potential for accidents at the busy spots at the top of the parking lot. Education provided on early mornings i.e. when parking is not as busy and it is cooler outside.   Patient expresses interest in activities that may overheat him such as hiking and suggestions are provided to increase tolerance indoors i.e. at a store or mall as well as considering short walks and local parks on concrete surfaces to avoid fall risk.   Patient is encouraged to consider adaptive equipment in the future as needed. I.e. a reacher to minimize fall risk when picking up. Education is provided on being prepared and aware of the progression and increased weakness that can occur with MS as well as the options to continue to be as active, safe and dependent as possible.   PATIENT EDUCATION: Education details: Dressing  ideas Person educated: Patient Education method: Explanation, Demonstration, and Verbal cues Education comprehension: verbalized understanding and needs further education  HOME EXERCISE PROGRAM: 01/06/23 Table Top Towel  Slides -- Access Code: [email or web address] 01/13/23 Coordination Exercises and Activities 01/20/23 Putty Exercises -- Access Code: [email or web address]  GOALS: Goals reviewed with patient? Yes  SHORT TERM GOALS: Target date: 02/07/23  Patient will demonstrate at least 45 lbs L grip strength as needed to open jars and other containers.  Baseline:38.4 lbs average Goal status: IN PROGRESS  2.  Pt will be able to place at least 30 blocks using right hand with completion of Box and Blocks test.  Baseline: 26 blocks Goal status: IN PROGRESS  3.  Pt will be aware of AE options to help with ADLs ie) washing back, buttons, cutting food etc. Baseline: In need of education re: modifications for areas of deficit Goal status: IN PROGRESS  4.  Pt will be able to carry 5 lbs with R hand with modified grasp/hold technique to carry groceries etc indoors. Baseline: Uses L hand and tires easily Goal status: IN PROGRESS  5.  Patient will demonstrate coordination/strength HEP with 25% verbal cues or less for proper execution.  Baseline: No UE HEP Goal status: IN PROGRESS   LONG TERM GOALS: Target date: 03/07/23  Patient will demonstrate at least 50 lbs R grip strength as needed to open jars and other containers.  Baseline: 38.4 lbs average Goal status: IN PROGRESS  2.  Pt will be able to place at least 35 blocks using right hand with completion of Box and Blocks test.  Baseline: 26 blocks Goal status: IN PROGRESS  3.  Patient will demonstrate independent HEP with no cues. Baseline: No UE HEP Goal status: IN PROGRESS  4.  Patient will demonstrate at least 10% improvement with quick Dash score (reporting 30% disability or less) indicating improved functional use of affected extremity.  Baseline: 40.9 point Goal status: INITIAL  5.  Patient will recall 3 energy conservation ideas and have access to Patient Education handouts for energy conservation techniques. Baseline: None in place Goal status: IN  PROGRESS   ASSESSMENT:  CLINICAL IMPRESSION:  Patient seen today for occupational therapy treatment for education re: MS disease process and modifications for R UE deficits. Instruction provided re: dressing ideas ie) for UE/LE with compensatory ideas for dressing weak side first.  He will continue to benefit from skilled OT services to increase strength and coordination for bilateral tasks and progress HEP, with education for energy conservation and adaptive equipment to help him as needed.  PERFORMANCE DEFICITS: in functional skills including ADLs, IADLs, coordination, dexterity, sensation, tone, ROM, strength, Fine motor control, Gross motor control, and UE functional use, cognitive skills including emotional and temperament/personality, and psychosocial skills including coping strategies and interpersonal interactions.   IMPAIRMENTS: are limiting patient from ADLs, IADLs, rest and sleep, leisure, and social participation.   CO-MORBIDITIES: has co-morbidities such as MS, Asperger and depression  that affects occupational performance. Patient will benefit from skilled OT to address above impairments and improve overall function.  REHAB POTENTIAL: Good   PLAN:  OT FREQUENCY: 1x/week  OT DURATION: 8 weeks  PLANNED INTERVENTIONS: self care/ADL training, therapeutic exercise, therapeutic activity, neuromuscular re-education, manual therapy, splinting, patient/family education, energy conservation, and coping strategies training  RECOMMENDED OTHER SERVICES: Patient is currently receiving PT services and regularly attends counseling with ongoing psych services.  May consider social stories as a coping mechanism to areas causing depression.  CONSULTED AND AGREED WITH PLAN  OF CARE: Patient  PLAN FOR NEXT SESSION:   Review and progress UE ROM HEP putty & coordination activities provided in first 2 sessions.  Provided [facility] education and handouts.  Try thumb strap for thumb  opposition/thumb extension.   Victorino Sparrow, OT 01/27/2023, 5:24 PM

## 2023-01-28 ENCOUNTER — Telehealth: Payer: Self-pay | Admitting: Neurology

## 2023-01-28 NOTE — Telephone Encounter (Signed)
[facility] adv [facility] sent to [facility] for open MRI [phone]

## 2023-01-29 DIAGNOSIS — F319 Bipolar disorder, unspecified: Secondary | ICD-10-CM | POA: Diagnosis not present

## 2023-01-30 DIAGNOSIS — E291 Testicular hypofunction: Secondary | ICD-10-CM | POA: Diagnosis not present

## 2023-02-03 ENCOUNTER — Ambulatory Visit: Payer: PPO | Attending: Neurology | Admitting: Occupational Therapy

## 2023-02-03 ENCOUNTER — Telehealth: Payer: Self-pay | Admitting: Neurology

## 2023-02-03 ENCOUNTER — Ambulatory Visit: Payer: PPO

## 2023-02-03 DIAGNOSIS — R2689 Other abnormalities of gait and mobility: Secondary | ICD-10-CM | POA: Diagnosis not present

## 2023-02-03 DIAGNOSIS — G35 Multiple sclerosis: Secondary | ICD-10-CM | POA: Diagnosis not present

## 2023-02-03 DIAGNOSIS — R2681 Unsteadiness on feet: Secondary | ICD-10-CM | POA: Diagnosis not present

## 2023-02-03 DIAGNOSIS — R278 Other lack of coordination: Secondary | ICD-10-CM | POA: Diagnosis not present

## 2023-02-03 DIAGNOSIS — Z9181 History of falling: Secondary | ICD-10-CM | POA: Insufficient documentation

## 2023-02-03 DIAGNOSIS — M6281 Muscle weakness (generalized): Secondary | ICD-10-CM

## 2023-02-03 NOTE — Patient Instructions (Addendum)
Energy Conservation Techniques  Sit for as many activities as possible. Use slow, smooth movements.  Rushing increases discomfort. Determine the necessity of performing the task.  Simplify those tasks that are necessary.  (Get clothes out of the dryer when they are warm instead of ironing, let dishes air dry, etc.) Take frequent rests both during and between activities.  Avoid repetitive tasks. Pre-plan your activities; try a daily and/or weekly schedule.  Spread out the activities that are most fatiguing (break up cleaning tasks over multiple days). Remember to plan a balance of work, rest and recreation. Consider the best time for each activity.  Do the most exertive task when you have the most energy. Don't carry items if you can push them.  Slide, don't lift. Push, don't pull. Utilize two hands when appropriate. Maintain good posture and use proper body mechanics. Avoid remaining in one position for too long. When lifting, bend at the knees, not at the waist.  Exhale when bending down, inhale when straightening up. Carry objects as close to your body and as near to the center of the pelvis.  11. Avoid wasted body movements (position yourself for the task so that you avoid bending, twisting, etc. when possible). 12. Select the best working environment.  Consider lighting, ventilation, clothing, and equipment. 13. Organize your storage areas, making the items you use daily convenient.  Store heaviest items at waist            height.  Store frequently used items between shoulders and knee height.  Consider leaving frequently used       items on countertops.  (You can organize in storage baskets based on time used/purpose). 14. Feelings and emotions can be real causes of fatigue.  Try to avoid unnecessary worry, irritation, or  frustration.  Avoid stress, it can also be a source of fatigue. 15. Get help from other people for difficult tasks. 16. Explore equipment or items that may be able to do  the job for you with greater ease.  (Electric can  openers, blenders, lightweight items for cleaning, etc.) 

## 2023-02-03 NOTE — Telephone Encounter (Signed)
George [name] came in from PT today and said that when he is yawning at night, he is shaking and having pain. This has been happening for months just a light pain though and he can't pin point where the pain comes from.  The pain increases with the yawn being deeper he  his body shakes more he feels the pain which only happens at night. He noticed he's more tired so not sleeping as a deep sleep.  At Pt the therapist looked up why yawning is important with MS she told him to ask Dr. Epimenio Foot if this is related to his issues of shaking.Just looking to see if you can suggest what he can do to sleep better doesn't not want drugs to sleep.

## 2023-02-03 NOTE — Therapy (Unsigned)
OUTPATIENT OCCUPATIONAL THERAPY NEURO TREATMENT  Patient Name: George [name] MRN: [id number] DOB:1970-11-29, 52 y.o., male Today's Date: 02/03/2023  PCP: Emilio Aspen, MD REFERRING PROVIDER: Asa Lente, MD  END OF SESSION:  OT End of Session - 02/03/23 1014     Visit Number 5    Number of Visits 9    Date for OT Re-Evaluation 03/07/23    Authorization Type [employer] PPO: Follows Medicare guidelines    Progress Note Due on Visit 9    OT Start Time 1015    OT Stop Time 1100    OT Time Calculation (min) 45 min    Activity Tolerance Patient tolerated treatment well    Behavior During Therapy WFL for tasks assessed/performed             Past Medical History:  Diagnosis Date   Anxiety    Asperger's syndrome    Autistic spectrum disorder    Asperger's (per H&P Olivia Clelland, PA)   Bipolar 1 disorder (HCC)    "no mood swings in 20 years"  on medication   GERD (gastroesophageal reflux disease)    Head injury 2020   Hypertension    Hypothyroidism    IBS (irritable bowel syndrome)    Past Surgical History:  Procedure Laterality Date   NO PAST SURGERIES     RADIOLOGY WITH ANESTHESIA N/A 05/16/2020   Procedure: MRI BRAIN WITH AND WITHOUT CONTRAST;  Surgeon: Radiologist, Medication, MD;  Location: [facility] OR;  Service: Radiology;  Laterality: N/A;   RADIOLOGY WITH ANESTHESIA N/A 08/29/2020   Procedure: MRI WITH ANESTHESIA CERVICAL SPINE WITH AND WITHOUT CONTRAST, THORACIC SPINE WITH AND WITHOUT CONTRAST;  Surgeon: Radiologist, Medication, MD;  Location: [facility] OR;  Service: Radiology;  Laterality: N/A;   RADIOLOGY WITH ANESTHESIA N/A 03/15/2021   Procedure: MRI BRAIN WITH AND WITHOUT, CERIVAL WITH AND WITHOUT;  Surgeon: Radiologist, Medication, MD;  Location: [facility] OR;  Service: Radiology;  Laterality: N/A;   Patient Active Problem List   Diagnosis Date Noted   Depression 01/01/2023   Bipolar disease, chronic (HCC) 06/20/2021   Slurred speech 02/26/2021    Autism 12/20/2020   Multiple sclerosis (HCC) 10/11/2020   MRI of brain abnormal 10/11/2020   Right sided weakness 10/11/2020   Gait disturbance 10/11/2020   Asperger syndrome 03/19/2013    ONSET DATE: 12/16/2022  REFERRING DIAG: R53.1 (ICD-10-CM) - Right sided weakness  G35 (ICD-10-CM) - Multiple sclerosis (HCC)  THERAPY DIAG:  Other lack of coordination  Muscle weakness (generalized)  MS (multiple sclerosis) (HCC)  Rationale for Evaluation and Treatment: Rehabilitation  SUBJECTIVE:   SUBJECTIVE STATEMENT:  Patient reports that he has continued to work hard on all of his exercises but continues to have trouble with his R thumb.  Pt accompanied by: self  PERTINENT HISTORY:   "MS HISTORY: He has a h/o Asperger syndrome and bipolar disease.   In 2012, he had an MRI of the brain and was found to have multiple T2/FLAIR hyperintense foci in the brain.   A lumbar puncture at the time was negative.    Over the last year, he began to have more difficulty with right hand coordination and left foot drop.    Also the right side of his face was drooping some.     He came in to see Dr. Marjory Lies and had repeat imaging studies showing progression of the white matter foci and the appearance of a small focus at C5.    Repeat LP, however, showed negative  CSF (no OCB) again.   He started glatiramer 10/2020."  Patient has been receiving PT services and primary therapist recommended OT evaluation due to R UE deficits.   PRECAUTIONS: Fall  WEIGHT BEARING RESTRICTIONS: No  PAIN:  Are you having pain? No  FALLS: Has patient fallen in last 6 months? No  LIVING ENVIRONMENT: Lives with: lives alone Lives in: House/apartment Stairs: Yes: External: 2 steps; on right going up (which is his bad hand) but he also has a ramp he can use with 2 rails Has following equipment at home: Grab bars and Ramped entry  PLOF: Independent with basic ADLs, Independent with gait, and still driving.  Reports  considering paying to have groceries delivered  PATIENT GOALS: Want to be able to use his hand better ie) typing, playing piano (but does report that this that might be unrealistic).  OBJECTIVE:   HAND DOMINANCE: Left  ADLs: Overall ADLs: Patient lives alone and manages his own ADLs Transfers/ambulation related to ADLs: MI - PT has been wondering about an AFO for safety without mobility on uneven surfaces. Eating: MI Grooming: MI UB Dressing: MI - difficulty with buttons  LB Dressing: MI Toileting: MI  Bathing: MI Tub Shower transfers: MI Equipment: Grab bars  IADLs: Shopping: MI but is interested in getting information about getting delivery services Light housekeeping: performs himself but reports his home is dusty Meal Prep: [employer] Financial mobility: MI with driving and ambulates without AE Medication management: MI Financial management: Independent Handwriting:  L handed  MOBILITY STATUS: Independent and difficulty carrying objections with ambulation  POSTURE COMMENTS:  rounded shoulders and forward head Sitting balance: No deficits  ACTIVITY TOLERANCE: Activity tolerance: Fair - patient reports   FUNCTIONAL OUTCOME MEASURES: Evaluation 01/06/23 Quick Dash: 40.9  UPPER EXTREMITY ROM:    Active ROM Right eval Left eval  Shoulder flexion Decreased end range [facility]  Shoulder abduction Uncomfortable > 90   Shoulder adduction    Shoulder extension    Shoulder internal rotation Uncomfortable    Shoulder external rotation    Elbow flexion    Elbow extension    Wrist flexion    Wrist extension    Wrist ulnar deviation    Wrist radial deviation    Wrist pronation    Wrist supination Limited end range   (Blank rows = not tested)  UPPER EXTREMITY MMT:     MMT Right eval Left eval  Shoulder flexion 4-/5 5/5  Shoulder abduction    Shoulder adduction    Shoulder extension    Shoulder internal rotation    Shoulder external rotation    Middle trapezius    Lower  trapezius    Elbow flexion    Elbow extension    Wrist flexion    Wrist extension    Wrist ulnar deviation    Wrist radial deviation    Wrist pronation    Wrist supination    (Blank rows = not tested)  HAND FUNCTION:  2 Trials conducted Grip strength: Right: 34.3 - 42.5  lbs; Left: 102 -104  lbs  COORDINATION: Box and Blocks:  Right 26 blocks, Left 57 blocks  SENSATION: Light touch: Impaired Some tingling in palmar surface  EDEMA: slight  MUSCLE TONE: RUE: tremors and LUE: Within functional limits  COGNITION: Overall cognitive status: Within functional limits for tasks assessed  VISION: Subjective report: He does reports some difficulty with L eye  Baseline vision:  WFL Visual history: NA  VISION ASSESSMENT: Not tested  Patient has difficulty  with some vision on L side by self report.  PERCEPTION: WFL  PRAXIS: WFL  EVALUATION OBSERVATIONS: Patient is a pleasant man with obvious R UE limitations in strength and coordination.  He is L handed but B tasks are challenging and is experiencing more tremors in R UE.  He is motivated to improve UE skills but worried about his symptoms - even concerned that they are indicative of Parkinson's and that his depression is a problem too but more situational than chemical as he says he feels better when he is talking to someone.   TODAY'S TREATMENT:                                                                                                                               Self Care:  Patient engaged in compensatory tasks and education for ADLs and progression of MS as patient reports having difficulty donning LB clothes sometimes. Education and demonstration provided re: dressing weaker R LE first which significantly improved ease of dressing with simulated theraband looped to make pant legs.   Also reviewed UB dressing ideas ie) flipping front closing shirt by flipping it over his head or swinging it around his body with "big"  sweeping motions.  Reviewed AE ideas for cutting with AE, cutting fingernails with AE and other kitchen options.   Patient engaged in education RE: disease process, and options as adaptive equipment may be appropriate in the future as well as potential need for a hand splint in the future to keep digits from contracting as his medial digits already lack full AROM into extension. Patient expressed his concerns about having to wear splints all the time. Education is provided regarding using splints as needed to improve range of motion or safety i.e. with wearing a Foot splint. Patient seemed to think a previous suggestion regarding AFO meant to wear it all the time.   Also reviewed energy conservation ideas. Patient reports parking at the end of parking lot and walking into stores but then being tired. He does have a handicap parking tag but chooses to park far away for "exercise" and to avoid potential for accidents at the busy spots at the top of the parking lot. Education provided on early mornings i.e. when parking is not as busy and it is cooler outside.   Patient expresses interest in activities that may overheat him such as hiking and suggestions are provided to increase tolerance indoors i.e. at a store or mall as well as considering short walks and local parks on concrete surfaces to avoid fall risk.   Patient is encouraged to consider adaptive equipment in the future as needed. I.e. a reacher to minimize fall risk when picking up. Education is provided on being prepared and aware of the progression and increased weakness that can occur with MS as well as the options to continue to be as active, safe and dependent as possible.   PATIENT EDUCATION: Education details: Dressing  ideas Person educated: Patient Education method: Explanation, Demonstration, and Verbal cues Education comprehension: verbalized understanding and needs further education  HOME EXERCISE PROGRAM: 01/06/23 Table Top Towel  Slides -- Access Code: [email or web address] 01/13/23 Coordination Exercises and Activities 01/20/23 Putty Exercises -- Access Code: [email or web address]  GOALS: Goals reviewed with patient? Yes  SHORT TERM GOALS: Target date: 02/07/23  Patient will demonstrate at least 45 lbs L grip strength as needed to open jars and other containers.  Baseline:38.4 lbs average Goal status: IN PROGRESS  2.  Pt will be able to place at least 30 blocks using right hand with completion of Box and Blocks test.  Baseline: 26 blocks Goal status: IN PROGRESS  3.  Pt will be aware of AE options to help with ADLs ie) washing back, buttons, cutting food etc. Baseline: In need of education re: modifications for areas of deficit Goal status: IN PROGRESS  4.  Pt will be able to carry 5 lbs with R hand with modified grasp/hold technique to carry groceries etc indoors. Baseline: Uses L hand and tires easily Goal status: IN PROGRESS  5.  Patient will demonstrate coordination/strength HEP with 25% verbal cues or less for proper execution.  Baseline: No UE HEP Goal status: IN PROGRESS   LONG TERM GOALS: Target date: 03/07/23  Patient will demonstrate at least 50 lbs R grip strength as needed to open jars and other containers.  Baseline: 38.4 lbs average Goal status: IN PROGRESS  2.  Pt will be able to place at least 35 blocks using right hand with completion of Box and Blocks test.  Baseline: 26 blocks Goal status: IN PROGRESS  3.  Patient will demonstrate independent HEP with no cues. Baseline: No UE HEP Goal status: IN PROGRESS  4.  Patient will demonstrate at least 10% improvement with quick Dash score (reporting 30% disability or less) indicating improved functional use of affected extremity.  Baseline: 40.9 point Goal status: INITIAL  5.  Patient will recall 3 energy conservation ideas and have access to Patient Education handouts for energy conservation techniques. Baseline: None in place Goal status: IN  PROGRESS   ASSESSMENT:  CLINICAL IMPRESSION:  Patient seen today for occupational therapy treatment for education re: MS disease process and modifications for R UE deficits. Instruction provided re: dressing ideas ie) for UE/LE with compensatory ideas for dressing weak side first.  He will continue to benefit from skilled OT services to increase strength and coordination for bilateral tasks and progress HEP, with education for energy conservation and adaptive equipment to help him as needed.  PERFORMANCE DEFICITS: in functional skills including ADLs, IADLs, coordination, dexterity, sensation, tone, ROM, strength, Fine motor control, Gross motor control, and UE functional use, cognitive skills including emotional and temperament/personality, and psychosocial skills including coping strategies and interpersonal interactions.   IMPAIRMENTS: are limiting patient from ADLs, IADLs, rest and sleep, leisure, and social participation.   CO-MORBIDITIES: has co-morbidities such as MS, Asperger and depression  that affects occupational performance. Patient will benefit from skilled OT to address above impairments and improve overall function.  REHAB POTENTIAL: Good   PLAN:  OT FREQUENCY: 1x/week  OT DURATION: 8 weeks  PLANNED INTERVENTIONS: self care/ADL training, therapeutic exercise, therapeutic activity, neuromuscular re-education, manual therapy, splinting, patient/family education, energy conservation, and coping strategies training  RECOMMENDED OTHER SERVICES: Patient is currently receiving PT services and regularly attends counseling with ongoing psych services.  May consider social stories as a coping mechanism to areas causing depression.  CONSULTED AND AGREED WITH PLAN  OF CARE: Patient  PLAN FOR NEXT SESSION:   Review and progress UE ROM HEP putty & coordination activities provided in first 2 sessions.  Provided [employer] education and handouts.  Try thumb strap for thumb  opposition/thumb extension.   MS education  Victorino Sparrow, OT 02/03/2023, 11:58 AM

## 2023-02-03 NOTE — Therapy (Signed)
OUTPATIENT PHYSICAL THERAPY DISCHARGE SUMMARY   Patient Name: George [name] MRN: [id number] DOB:03-26-1971, 52 y.o., male Today's Date: 02/03/2023  PCP: Kirby Funk, MD REFERRING PROVIDER: Asa Lente, MD  END OF SESSION:   PT End of Session - 02/03/23 1056     Visit Number 9    Number of Visits 9   8 + eval   Date for PT Re-Evaluation 01/31/23   pushed out due to scheduling preferences of patient   Authorization Type [employer]    PT Start Time 1100    PT Stop Time 1145    PT Time Calculation (min) 45 min    Equipment Utilized During Treatment Gait belt    Activity Tolerance Patient tolerated treatment well    Behavior During Therapy WFL for tasks assessed/performed                  Past Medical History:  Diagnosis Date   Anxiety    Asperger's syndrome    Autistic spectrum disorder    Asperger's (per H&P Olivia Clelland, PA)   Bipolar 1 disorder (HCC)    "no mood swings in 20 years"  on medication   GERD (gastroesophageal reflux disease)    Head injury 2020   Hypertension    Hypothyroidism    IBS (irritable bowel syndrome)    Past Surgical History:  Procedure Laterality Date   NO PAST SURGERIES     RADIOLOGY WITH ANESTHESIA N/A 05/16/2020   Procedure: MRI BRAIN WITH AND WITHOUT CONTRAST;  Surgeon: Radiologist, Medication, MD;  Location: [facility] OR;  Service: Radiology;  Laterality: N/A;   RADIOLOGY WITH ANESTHESIA N/A 08/29/2020   Procedure: MRI WITH ANESTHESIA CERVICAL SPINE WITH AND WITHOUT CONTRAST, THORACIC SPINE WITH AND WITHOUT CONTRAST;  Surgeon: Radiologist, Medication, MD;  Location: [facility] OR;  Service: Radiology;  Laterality: N/A;   RADIOLOGY WITH ANESTHESIA N/A 03/15/2021   Procedure: MRI BRAIN WITH AND WITHOUT, CERIVAL WITH AND WITHOUT;  Surgeon: Radiologist, Medication, MD;  Location: [facility] OR;  Service: Radiology;  Laterality: N/A;   Patient Active Problem List   Diagnosis Date Noted   Depression 01/01/2023   Bipolar disease, chronic  (HCC) 06/20/2021   Slurred speech 02/26/2021   Autism 12/20/2020   Multiple sclerosis (HCC) 10/11/2020   MRI of brain abnormal 10/11/2020   Right sided weakness 10/11/2020   Gait disturbance 10/11/2020   Asperger syndrome 03/19/2013    ONSET DATE: 11/18/2022 (referral date)  REFERRING DIAG: G35 (ICD-10-CM) - Multiple sclerosis R26.9 (ICD-10-CM) - Gait disturbance R53.1 (ICD-10-CM) - Right sided weakness R53.1 (ICD-10-CM) - Weakness  THERAPY DIAG:  Muscle weakness (generalized)  Unsteadiness on feet  History of falling  Other abnormalities of gait and mobility  Rationale for Evaluation and Treatment: Rehabilitation  SUBJECTIVE:  SUBJECTIVE STATEMENT: Pt reports no pain. Pt reports no falls or near falls. Pt reports I am concerned about my MS getting worst and falling again.  Pt accompanied by: self - he drove himself  PERTINENT HISTORY: Autism, MS, Bipolar 1, HTN, head injury 2020  FROM MD NOTE:  "MS HISTORY: He has a h/o Asperger syndrome and bipolar disease.  In 2012, he had an MRI of the brain and was found to have multiple T2/FLAIR hyperintense foci in the brain.  A lumbar puncture at the time was negative.  Over the last year, he began to have more difficulty with right hand coordination and left foot drop.  Also the right side of his face was drooping some.  He came in to see Dr. Marjory Lies and had repeat imaging studies showing progression of the white matter foci and the appearance of a small focus at C5.  Repeat LP, however, showed negative CSF (no OCB) again.  He started glatiramer 10/2020."  PAIN:  Are you having pain? No  PRECAUTIONS: Fall  WEIGHT BEARING RESTRICTIONS: No  FALLS: Has patient fallen in last 6 months? No  LIVING ENVIRONMENT: Lives with: lives alone Lives in:  House/apartment Stairs: Yes: External: 2 steps; none Has following equipment at home: Grab bars and Ramped entry-he uses ramp w/o issue  PLOF: Independent  PATIENT GOALS: "to strengthen my ankles and for my throat" - PT to obtain ST referral.  OBJECTIVE:   DIAGNOSTIC FINDINGS: Imaging studies reviewed: MRI of the brain 11/21/2010 shows multiple T2/FLAIR hyperintense foci including a focus in the left pons, middle cerebellar peduncle, right thalamus.  Some foci of periventricular and some foci are juxtacortical.  None of the foci enhanced.   MRI of the brain 05/16/2020 showed a similar pattern at the 2012 MRI but there has been mild progression with at least 2 new lesions noted in the white matter compared to the previous MRI.  As seen before there is a large focus involving the left greater than right pons.  It may be slightly larger.  There is also involvement of the middle cerebellar peduncle which is stable on the right but a new focus on the left.  There is one stable focus in the right thalamus.   MRI of the cervical spine 08/29/2020 showed a small focus posteriorly to the right adjacent to C5.  Mild DJD at C5-C6 not causing nerve root compression.   MRI of the thoracic spine 08/29/2020 showed a normal spinal cord (subtle focus at T10-T11 more likely artifact).  There are cystic lesions within the kidneys   MRI of the brain 03/15/2021 and it was unchanged.   No new foci. IMPRESSION: Multiple supratentorial and infratentorial T2 hyperintense lesions consistent with the clinical diagnosis of multiple sclerosis. No new or enhancing lesion identified.   MRI of the cervical spine 03/15/2021 showed a focus to the right at [facility].  COGNITION: Overall cognitive status: Within functional limits for tasks assessed   SENSATION: Light touch: WFL  COORDINATION: RLE more dysmetric than LLE noted on LE RAMS and heel-to-shin assessments.  EDEMA:  None noted in BLE  MUSCLE TONE: None noted in BLE  during functional tasks.  POSTURE: No Significant postural limitations  LOWER EXTREMITY ROM:     Active  Right Eval Left Eval  Hip flexion Grossly [facility]  Hip extension   Hip abduction   Hip adduction   Hip internal rotation   Hip external rotation   Knee flexion   Knee extension   Ankle  dorsiflexion   Ankle plantarflexion   Ankle inversion    Ankle eversion     (Blank rows = not tested)  LOWER EXTREMITY MMT:    MMT Right Eval Left Eval  Hip flexion 4/5 4+/5  Hip extension    Hip abduction    Hip adduction    Hip internal rotation    Hip external rotation    Knee flexion    Knee extension 4+/5 5/5  Ankle dorsiflexion 3+/5 4+/5  Ankle plantarflexion    Ankle inversion    Ankle eversion    (Blank rows = not tested)  BED MOBILITY:  Sit to supine Complete Independence Supine to sit Complete Independence  TRANSFERS: Assistive device utilized: None  Sit to stand: Complete Independence Stand to sit: Complete Independence Chair to chair: Complete Independence  GAIT: Gait pattern: decreased arm swing- Right, decreased arm swing- Left, decreased stride length, decreased hip/knee flexion- Right, and decreased ankle dorsiflexion- Right Distance walked: various clinic distances Assistive device utilized: None Level of assistance: SBA Comments: Intermittent right knee hyperextension and steppage gait to compensate for foot drop.  FUNCTIONAL TESTS:  5 times sit to stand: 10.75 seconds w/ intermittent LUE support Functional gait assessment: 19/30  [facility] PT Assessment - 02/03/23 0001       Functional Gait  Assessment   Gait Level Surface Walks 20 ft in less than 5.5 sec, no assistive devices, good speed, no evidence for imbalance, normal gait pattern, deviates no more than 6 in outside of the 12 in walkway width.    Change in Gait Speed Able to smoothly change walking speed without loss of balance or gait deviation. Deviate no more than 6 in outside of the 12 in walkway  width.    Gait with Horizontal Head Turns Performs head turns smoothly with slight change in gait velocity (eg, minor disruption to smooth gait path), deviates 6-10 in outside 12 in walkway width, or uses an assistive device.    Gait with Vertical Head Turns Performs task with slight change in gait velocity (eg, minor disruption to smooth gait path), deviates 6 - 10 in outside 12 in walkway width or uses assistive device    Gait and Pivot Turn Pivot turns safely within 3 sec and stops quickly with no loss of balance.    Step Over Obstacle Is able to step over 2 stacked shoe boxes taped together (9 in total height) without changing gait speed. No evidence of imbalance.    Gait with Narrow Base of Support Is able to ambulate for 10 steps heel to toe with no staggering.    Gait with Eyes Closed Walks 20 ft, uses assistive device, slower speed, mild gait deviations, deviates 6-10 in outside 12 in walkway width. Ambulates 20 ft in less than 9 sec but greater than 7 sec.    Ambulating Backwards Walks 20 ft, uses assistive device, slower speed, mild gait deviations, deviates 6-10 in outside 12 in walkway width.    Steps Alternating feet, must use rail.    Total Score 25    FGA comment: 25/30               01/13/23 1122  Functional Gait  Assessment  Gait assessed  Yes  Gait Level Surface 2  Change in Gait Speed 2  Gait with Horizontal Head Turns 1  Gait with Vertical Head Turns 2  Gait and Pivot Turn 3  Step Over Obstacle 2  Gait with Narrow Base of Support 0  Gait  with Eyes Closed 1  Ambulating Backwards 2  Steps 2  Total Score 17  FGA comment: 17/30 = significant fall risk    [facility] PT Assessment - 02/03/23 0001       Functional Gait  Assessment   Gait Level Surface Walks 20 ft in less than 5.5 sec, no assistive devices, good speed, no evidence for imbalance, normal gait pattern, deviates no more than 6 in outside of the 12 in walkway width.    Change in Gait Speed Able to smoothly  change walking speed without loss of balance or gait deviation. Deviate no more than 6 in outside of the 12 in walkway width.    Gait with Horizontal Head Turns Performs head turns smoothly with slight change in gait velocity (eg, minor disruption to smooth gait path), deviates 6-10 in outside 12 in walkway width, or uses an assistive device.    Gait with Vertical Head Turns Performs task with slight change in gait velocity (eg, minor disruption to smooth gait path), deviates 6 - 10 in outside 12 in walkway width or uses assistive device    Gait and Pivot Turn Pivot turns safely within 3 sec and stops quickly with no loss of balance.    Step Over Obstacle Is able to step over 2 stacked shoe boxes taped together (9 in total height) without changing gait speed. No evidence of imbalance.    Gait with Narrow Base of Support Is able to ambulate for 10 steps heel to toe with no staggering.    Gait with Eyes Closed Walks 20 ft, uses assistive device, slower speed, mild gait deviations, deviates 6-10 in outside 12 in walkway width. Ambulates 20 ft in less than 9 sec but greater than 7 sec.    Ambulating Backwards Walks 20 ft, uses assistive device, slower speed, mild gait deviations, deviates 6-10 in outside 12 in walkway width.    Steps Alternating feet, must use rail.    Total Score 25    FGA comment: 25/30               PATIENT SURVEYS:  None relevant to CC.  TODAY'S TREATMENT:                                                                                                                              DATE:  Reassessment with FGA performed today. Discussed final HEP Backward walking with intermittent 1 UE support: 10x laps Stair training: 3 x 4 steps with one rail, cues to look down at his feet when going up and down to improve visual feedback.     PATIENT EDUCATION: Education details:  FGA outcome.  Benefit of AFO, pt is no longer open to this option even for uneven surfaces or long  distance.  Discussed elevated fall risk without AFO and MS pathology related to his foot drop.  Education on anatomy of ankle and changes related to foot drop and knee hyperextension and  benefit of bracing for this.  Discussed limitations of strengthening DF muscles due to MS and locations of lesions.  Discussed HEP additions and wrote details of wall bumps on paper for pt understanding as could not find MedBridge picture.  Walking program. Person educated: Patient Education method: Explanation Education comprehension: verbalized understanding  HOME EXERCISE PROGRAM: Access Code: [email or web address] URL: [email or web address] Date: 12/02/2022 Prepared by: Camille Bal  Exercises - Seated Ankle Alphabet  - 1 x daily - 7 x weekly - 3 sets - 10 reps - Seated Heel Toe Raises  - 1 x daily - 5 x weekly - 3 sets - 4-5 reps - 3 seconds each hold - Ankle Dorsiflexion with Resistance  - 1 x daily - 5 x weekly - 1-2 sets - 10 reps - Staggered Sit-to-Stand  - 1 x daily - 7 x weekly - 2 sets - 10 reps - Squat with Chair Touch  - 1 x daily - 7 x weekly - 2 sets - 10 reps - Lunge with Counter Support  - 1 x daily - 7 x weekly - 2 sets - 10 reps - Heel Walking with Counter Support  - 1 x daily - 7 x weekly - 3 sets - 10 reps -Wall bumps for DF strengthening (wrote details on pt paper)  You Can Walk For A Certain Length Of Time Each Day                          Walk 8 minutes 1-2 times per day.             Increase 2  minutes every 7 days              Work up to 20 minutes (1-2 times per day).               Example:                         Day 1-2           4-5 minutes     3 times per day                         Day 7-8           10-12 minutes 2-3 times per day                         Day 13-14       20-22 minutes 1-2 times per day  02/03/23- George [name] Physical therapy exercises: Ankle alphabets: A-Z (1-3x/day) Heel to toe walking: 5-10 laps at the couch Backwards walking: 5-10 laps at  the couch/counter Sit to stand: 10x (1-2x/day) Remember: to swing your arms when you are practicing your normal walking.  GOALS: Goals reviewed with patient? Yes  SHORT TERM GOALS: Target date: 12/27/2022  Pt will be independent with initial strength and balance HEP to address right LE weakness and foot drop and maintain progress from PT. Baseline:  Pt intermittently compliant, he is independent (5/20) Goal status: MET  2.  Pt will improve FGA score to >/=23/30 in order to demonstrate improved balance and decreased fall risk. Baseline: 19/30; 17/30 (6/10); 25/30 (02/03/23) Goal status: MET  3.  Pt will trial right foot-up brace to assess benefit to safe walking mechanics and managing foot-drop with purchase as desired. Baseline: Pt not open  to option at this time. (5/20) Goal status: REVISED-D/C'd  LONG TERM GOALS: Target date: 01/24/2023  Pt will report adherence to walking program x3 days or more per week in order to maintain BLE strength and to continue practicing safe walking mechanics. Baseline: To be established. Goal status: MET  2.  Pt will improve FGA score to >/=27/30 in order to demonstrate improved balance and decreased fall risk. Baseline: 19/30; 17/30 (6/10); 25/30 (02/03/23) Goal status: MET  3.  AFO to be trialed with order placed as appropriate if pt is agreeable and foot-up brace is not a better option. Baseline: To be assessed.; Pt no longer open to option for any conditions (6/10) Goal status: REVISED-D/C'd 6/10  ASSESSMENT:  CLINICAL IMPRESSION:  PT has been seen for total of 9 sessions from 11/26/22 to 02/03/23. Patient has demonstrated significant improvement in his functional gait assessment with today's score of 25/30 which puts him in low fall risk category. Patient has made significant progress towards his functional goals and has reached maximum potential at this time. Pt is reporting 100% compliance with daily HEP currently. Patient is being discharged from  skilled PT with independent home exercise program. We discussed overall goal progress with discharged from skilled PT with patient. Patient will continue with OT for few more session at our clinic. Pt verbalized agreement.    OBJECTIVE IMPAIRMENTS: Abnormal gait, decreased balance, decreased coordination, and decreased strength.   ACTIVITY LIMITATIONS: stairs and locomotion level  PARTICIPATION LIMITATIONS: driving and community activity  PERSONAL FACTORS: Behavior pattern, Past/current experiences, Time since onset of injury/illness/exacerbation, and 1-2 comorbidities: Autism-sensory intolerances limiting bracing options, MS  are also affecting patient's functional outcome.   REHAB POTENTIAL: Excellent  CLINICAL DECISION MAKING: Stable/uncomplicated  EVALUATION COMPLEXITY: Low  PLAN: Discharge from PT   Ileana Ladd, PT, DPT 02/03/2023, 11:40 AM

## 2023-02-03 NOTE — Telephone Encounter (Signed)
I called pt.  He states that he has been having issue with yawning causing r sided body shaking / pain.  (He said has had for a year, but this last week noted worsening. Noted more pain/ R body shaking when yawns more deeply.  Had bad episode last pm, slept only 4 hours then toss turned.  His sleep hygiene regimen sounds good. I relayed unfamiliar with this phenomenon.  MS and yawning.  (He states he is not fatigued).

## 2023-02-03 NOTE — Patient Instructions (Signed)
02/03/23- Langston Masker Physical therapy exercises: Ankle alphabets: A-Z (1-3x/day) Heel to toe walking: 5-10 laps at the couch Backwards walking: 5-10 laps at the couch/counter Sit to stand: 10x (1-2x/day) Remember: to swing your arms when you are practicing your normal walking.

## 2023-02-04 NOTE — Telephone Encounter (Signed)
I called pt and relayed per Dr. Epimenio Foot below.  He verbalized understanding. He has a lot of anxiety.  He is not in any pain, relayed that pointing his toe that the spasms will go away.  The yawning he feels may be bp med related and he will be following up with pcp about that.  I relayed that would not do anything right now, monitor, oc course if things get worse to let us know.  (There are medications for spasms if needed in future).

## 2023-02-05 DIAGNOSIS — F319 Bipolar disorder, unspecified: Secondary | ICD-10-CM | POA: Diagnosis not present

## 2023-02-10 ENCOUNTER — Ambulatory Visit: Payer: PPO | Admitting: Occupational Therapy

## 2023-02-13 DIAGNOSIS — E291 Testicular hypofunction: Secondary | ICD-10-CM | POA: Diagnosis not present

## 2023-02-17 ENCOUNTER — Ambulatory Visit: Payer: PPO | Admitting: Occupational Therapy

## 2023-02-17 DIAGNOSIS — R278 Other lack of coordination: Secondary | ICD-10-CM | POA: Diagnosis not present

## 2023-02-17 DIAGNOSIS — M6281 Muscle weakness (generalized): Secondary | ICD-10-CM

## 2023-02-17 DIAGNOSIS — G35 Multiple sclerosis: Secondary | ICD-10-CM

## 2023-02-17 NOTE — Therapy (Signed)
OUTPATIENT OCCUPATIONAL THERAPY NEURO TREATMENT  Patient Name: George [name] MRN: [id number] DOB:1971/05/22, 52 y.o., male Today's Date: 02/17/2023  PCP: Emilio Aspen, MD REFERRING PROVIDER: Asa Lente, MD  END OF SESSION:  OT End of Session - 02/17/23 1024     Visit Number 6    Number of Visits 9    Date for OT Re-Evaluation 03/07/23    Authorization Type [employer] PPO: Follows Medicare guidelines    Progress Note Due on Visit 9    OT Start Time 1022    OT Stop Time 1102    OT Time Calculation (min) 40 min    Equipment Utilized During Treatment FM objects    Activity Tolerance Patient tolerated treatment well    Behavior During Therapy WFL for tasks assessed/performed             Past Medical History:  Diagnosis Date   Anxiety    Asperger's syndrome    Autistic spectrum disorder    Asperger's (per H&P Olivia Clelland, PA)   Bipolar 1 disorder (HCC)    "no mood swings in 20 years"  on medication   GERD (gastroesophageal reflux disease)    Head injury 2020   Hypertension    Hypothyroidism    IBS (irritable bowel syndrome)    Past Surgical History:  Procedure Laterality Date   NO PAST SURGERIES     RADIOLOGY WITH ANESTHESIA N/A 05/16/2020   Procedure: MRI BRAIN WITH AND WITHOUT CONTRAST;  Surgeon: Radiologist, Medication, MD;  Location: [facility] OR;  Service: Radiology;  Laterality: N/A;   RADIOLOGY WITH ANESTHESIA N/A 08/29/2020   Procedure: MRI WITH ANESTHESIA CERVICAL SPINE WITH AND WITHOUT CONTRAST, THORACIC SPINE WITH AND WITHOUT CONTRAST;  Surgeon: Radiologist, Medication, MD;  Location: [facility] OR;  Service: Radiology;  Laterality: N/A;   RADIOLOGY WITH ANESTHESIA N/A 03/15/2021   Procedure: MRI BRAIN WITH AND WITHOUT, CERIVAL WITH AND WITHOUT;  Surgeon: Radiologist, Medication, MD;  Location: [facility] OR;  Service: Radiology;  Laterality: N/A;   Patient Active Problem List   Diagnosis Date Noted   Depression 01/01/2023   Bipolar disease, chronic  (HCC) 06/20/2021   Slurred speech 02/26/2021   Autism 12/20/2020   Multiple sclerosis (HCC) 10/11/2020   MRI of brain abnormal 10/11/2020   Right sided weakness 10/11/2020   Gait disturbance 10/11/2020   Asperger syndrome 03/19/2013    ONSET DATE: 12/16/2022  REFERRING DIAG: R53.1 (ICD-10-CM) - Right sided weakness  G35 (ICD-10-CM) - Multiple sclerosis (HCC)  THERAPY DIAG:  Muscle weakness (generalized)  Other lack of coordination  MS (multiple sclerosis) (HCC)  Rationale for Evaluation and Treatment: Rehabilitation  SUBJECTIVE:   SUBJECTIVE STATEMENT:  Patient reported being unable to come last week due to being in a manic state of his bipolar and not feeling appropriate to be at therapy.  Pt accompanied by: self  PERTINENT HISTORY:   "MS HISTORY: He has a h/o Asperger syndrome and bipolar disease.   In 2012, he had an MRI of the brain and was found to have multiple T2/FLAIR hyperintense foci in the brain.   A lumbar puncture at the time was negative.    Over the last year, he began to have more difficulty with right hand coordination and left foot drop.    Also the right side of his face was drooping some.     He came in to see Dr. Marjory Lies and had repeat imaging studies showing progression of the white matter foci and the appearance of a  small focus at C5.    Repeat LP, however, showed negative CSF (no OCB) again.   He started glatiramer 10/2020."  Patient has been receiving PT services and primary therapist recommended OT evaluation due to R UE deficits.   PRECAUTIONS: Fall  WEIGHT BEARING RESTRICTIONS: No  PAIN:  Are you having pain? No  FALLS: Has patient fallen in last 6 months? No  LIVING ENVIRONMENT: Lives with: lives alone Lives in: House/apartment Stairs: Yes: External: 2 steps; on right going up (which is his bad hand) but he also has a ramp he can use with 2 rails Has following equipment at home: Grab bars and Ramped entry  PLOF: Independent with  basic ADLs, Independent with gait, and still driving.  Reports considering paying to have groceries delivered  PATIENT GOALS: Want to be able to use his hand better ie) typing, playing piano (but does report that this that might be unrealistic).  OBJECTIVE:   HAND DOMINANCE: Left  ADLs: Overall ADLs: Patient lives alone and manages his own ADLs Transfers/ambulation related to ADLs: MI - PT has been wondering about an AFO for safety without mobility on uneven surfaces. Eating: MI Grooming: MI UB Dressing: MI - difficulty with buttons  LB Dressing: MI Toileting: MI  Bathing: MI Tub Shower transfers: MI Equipment: Grab bars  IADLs: Shopping: MI but is interested in getting information about getting delivery services Light housekeeping: performs himself but reports his home is dusty Meal Prep: [employer] mobility: MI with driving and ambulates without AE Medication management: MI Financial management: Independent Handwriting:  L handed  MOBILITY STATUS: Independent and difficulty carrying objections with ambulation  POSTURE COMMENTS:  rounded shoulders and forward head Sitting balance: No deficits  ACTIVITY TOLERANCE: Activity tolerance: Fair - patient reports   FUNCTIONAL OUTCOME MEASURES: Evaluation 01/06/23 Quick Dash: 40.9  UPPER EXTREMITY ROM:    Active ROM Right eval Left eval  Shoulder flexion Decreased end range [facility]  Shoulder abduction Uncomfortable > 90   Shoulder adduction    Shoulder extension    Shoulder internal rotation Uncomfortable    Shoulder external rotation    Elbow flexion    Elbow extension    Wrist flexion    Wrist extension    Wrist ulnar deviation    Wrist radial deviation    Wrist pronation    Wrist supination Limited end range   (Blank rows = not tested)  UPPER EXTREMITY MMT:     MMT Right eval Left eval  Shoulder flexion 4-/5 5/5  Shoulder abduction    Shoulder adduction    Shoulder extension    Shoulder internal rotation     Shoulder external rotation    Middle trapezius    Lower trapezius    Elbow flexion    Elbow extension    Wrist flexion    Wrist extension    Wrist ulnar deviation    Wrist radial deviation    Wrist pronation    Wrist supination    (Blank rows = not tested)  HAND FUNCTION:  2 Trials conducted Grip strength: Right: 34.3 - 42.5  lbs; Left: 102 -104  lbs  COORDINATION: Box and Blocks:  Right 26 blocks, Left 57 blocks  SENSATION: Light touch: Impaired Some tingling in palmar surface  EDEMA: slight  MUSCLE TONE: RUE: tremors and LUE: Within functional limits  COGNITION: Overall cognitive status: Within functional limits for tasks assessed  VISION: Subjective report: He does reports some difficulty with L eye  Baseline vision:  [facility]  Visual history: NA  VISION ASSESSMENT: Not tested  Patient has difficulty with some vision on L side by self report.  PERCEPTION: WFL  PRAXIS: WFL  EVALUATION OBSERVATIONS: Patient is a pleasant man with obvious R UE limitations in strength and coordination.  He is L handed but B tasks are challenging and is experiencing more tremors in R UE.  He is motivated to improve UE skills but worried about his symptoms - even concerned that they are indicative of Parkinson's and that his depression is a problem too but more situational than chemical as he says he feels better when he is talking to someone.   TODAY'S TREATMENT:                                                                                                                               Therapeutic Activities:   Pincer grasp activities with work on improving pad to pad touching between thumb and digits ie) not to fingernail with trial of Oval 8 splint to keep the thumb MP joint extended.  He is able to use the splint a couple of times throughout session to work on managing pegs and then removed it to practice active extension of R thumb   - to place and remove pegs in pegboard and gold  tee like pegs in putty   - to line up and stack dominos  Patient able to recognize shoulder instability and elevation which results in tremors/shaking and work towards self-correction.  Throughout activities patient is given compensatory strategies and is encouraged to use rest, stretching and repositioning hand/thumb, etc.  PATIENT EDUCATION: Education details: Finger to thumb opposition Person educated: Patient Education method: Explanation, Demonstration, and Verbal cues Education comprehension: verbalized understanding and needs further education  HOME EXERCISE PROGRAM: 01/06/23 Table Top Towel Slides -- Access Code: [email or web address] 01/13/23 Coordination Exercises and Activities 01/20/23 Putty Exercises -- Access Code: [email or web address] 02/03/23 Energy Conservation Techniques with detailed Strategy handout with images  GOALS: Goals reviewed with patient? Yes  SHORT TERM GOALS: Target date: 02/07/23  Patient will demonstrate at least 45 lbs L grip strength as needed to open jars and other containers.  Baseline:38.4 lbs average Goal status: IN PROGRESS  2.  Pt will be able to place at least 30 blocks using right hand with completion of Box and Blocks test.  Baseline: 26 blocks Goal status: IN PROGRESS  3.  Pt will be aware of AE options to help with ADLs ie) washing back, buttons, cutting food etc. Baseline: In need of education re: modifications for areas of deficit Goal status: IN PROGRESS  4.  Pt will be able to carry 5 lbs with R hand with modified grasp/hold technique to carry groceries etc indoors. Baseline: Uses L hand and tires easily Goal status: IN PROGRESS  5.  Patient will demonstrate coordination/strength HEP with 25% verbal cues or less for proper execution.  Baseline: No UE HEP Goal status: MET 02/17/23 - Patient  is doing his HEP - putty and coordination and can rotate ball etc   LONG TERM GOALS: Target date: 03/07/23  Patient will demonstrate at least 50 lbs R grip strength as  needed to open jars and other containers.  Baseline: 38.4 lbs average Goal status: IN PROGRESS  2.  Pt will be able to place at least 35 blocks using right hand with completion of Box and Blocks test.  Baseline: 26 blocks Goal status: IN PROGRESS  3.  Patient will demonstrate independent HEP with no cues. Baseline: No UE HEP Goal status: IN PROGRESS  4.  Patient will demonstrate at least 10% improvement with quick Dash score (reporting 30% disability or less) indicating improved functional use of affected extremity.  Baseline: 40.9 point Goal status: IN PROGRESS  5.  Patient will recall 3 energy conservation ideas and have access to Patient Education handouts for energy conservation techniques. Baseline: None in place Goal status: IN PROGRESS 02/03/23 - Handouts printed and provided   ASSESSMENT:  CLINICAL IMPRESSION:  Patient seen today for occupational therapy treatment for ongoing progression of UE FM skills and strengthening.  He has difficulty with thumb to finger tip opposition for some FM tasks and trial of oval 8 splint was helpful but patient does not want to have to rely it.  It was not initially provided to him but he wanted to try it for night time with encouragement to increased wear time in the day.  He continues to benefit from skilled OT services to provide education and adaptations for increasing strength and coordination for bilateral tasks and progress HEP, with education re future adaptive equipment and/or splinting to help him as needed.  PERFORMANCE DEFICITS: in functional skills including ADLs, IADLs, coordination, dexterity, sensation, tone, ROM, strength, Fine motor control, Gross motor control, and UE functional use, cognitive skills including emotional and temperament/personality, and psychosocial skills including coping strategies and interpersonal interactions.   IMPAIRMENTS: are limiting patient from ADLs, IADLs, rest and sleep, leisure, and social  participation.   CO-MORBIDITIES: has co-morbidities such as MS, Asperger and depression  that affects occupational performance. Patient will benefit from skilled OT to address above impairments and improve overall function.  REHAB POTENTIAL: Good   PLAN:  OT FREQUENCY: 1x/week  OT DURATION: 8 weeks  PLANNED INTERVENTIONS: self care/ADL training, therapeutic exercise, therapeutic activity, neuromuscular re-education, manual therapy, splinting, patient/family education, energy conservation, and coping strategies training  RECOMMENDED OTHER SERVICES: Patient is recently discharged from PT services and regularly attends counseling with ongoing psych services.  May consider social stories as a coping mechanism to areas causing depression.  CONSULTED AND AGREED WITH PLAN OF CARE: Patient  PLAN FOR NEXT SESSION:   Retest goals and see about continued therapy vs DC  Review progress towards established goals (Grip and FM tasks).  Review and progress UE ROM HEP putty & coordination activities.  Ongoing MS education.   Victorino Sparrow, OT 02/17/2023, 1:42 PM

## 2023-02-18 DIAGNOSIS — M47814 Spondylosis without myelopathy or radiculopathy, thoracic region: Secondary | ICD-10-CM | POA: Diagnosis not present

## 2023-02-20 ENCOUNTER — Telehealth: Payer: Self-pay | Admitting: *Deleted

## 2023-02-20 DIAGNOSIS — I10 Essential (primary) hypertension: Secondary | ICD-10-CM | POA: Diagnosis not present

## 2023-02-20 DIAGNOSIS — F319 Bipolar disorder, unspecified: Secondary | ICD-10-CM | POA: Diagnosis not present

## 2023-02-20 DIAGNOSIS — G35 Multiple sclerosis: Secondary | ICD-10-CM | POA: Diagnosis not present

## 2023-02-20 NOTE — Telephone Encounter (Signed)
Patient informed with results.  

## 2023-02-24 ENCOUNTER — Ambulatory Visit: Payer: PPO | Admitting: Occupational Therapy

## 2023-02-24 DIAGNOSIS — R278 Other lack of coordination: Secondary | ICD-10-CM | POA: Diagnosis not present

## 2023-02-24 DIAGNOSIS — M6281 Muscle weakness (generalized): Secondary | ICD-10-CM

## 2023-02-24 NOTE — Therapy (Unsigned)
OUTPATIENT OCCUPATIONAL THERAPY NEURO TREATMENT  Patient Name: George [name] MRN: [id number] DOB:[birth year], 52 y.o., male Today's Date: 02/24/2023  PCP: Emilio Aspen, MD REFERRING PROVIDER: Asa Lente, MD  END OF SESSION:  OT End of Session - 02/24/23 1011     Visit Number 7    Number of Visits 9    Date for OT Re-Evaluation 03/07/23    Authorization Type [employer] PPO: Follows Medicare guidelines    Progress Note Due on Visit 9    OT Start Time 1014    OT Stop Time 1059    OT Time Calculation (min) 45 min    Equipment Utilized During Treatment FM objects    Activity Tolerance Patient tolerated treatment well    Behavior During Therapy WFL for tasks assessed/performed             Past Medical History:  Diagnosis Date   Anxiety    Asperger's syndrome    Autistic spectrum disorder    Asperger's (per H&P Olivia Clelland, PA)   Bipolar 1 disorder (HCC)    "no mood swings in 20 years"  on medication   GERD (gastroesophageal reflux disease)    Head injury 2020   Hypertension    Hypothyroidism    IBS (irritable bowel syndrome)    Past Surgical History:  Procedure Laterality Date   NO PAST SURGERIES     RADIOLOGY WITH ANESTHESIA N/A 05/16/2020   Procedure: MRI BRAIN WITH AND WITHOUT CONTRAST;  Surgeon: Radiologist, Medication, MD;  Location: [facility] OR;  Service: Radiology;  Laterality: N/A;   RADIOLOGY WITH ANESTHESIA N/A 08/29/2020   Procedure: MRI WITH ANESTHESIA CERVICAL SPINE WITH AND WITHOUT CONTRAST, THORACIC SPINE WITH AND WITHOUT CONTRAST;  Surgeon: Radiologist, Medication, MD;  Location: [facility] OR;  Service: Radiology;  Laterality: N/A;   RADIOLOGY WITH ANESTHESIA N/A 03/15/2021   Procedure: MRI BRAIN WITH AND WITHOUT, CERIVAL WITH AND WITHOUT;  Surgeon: Radiologist, Medication, MD;  Location: [facility] OR;  Service: Radiology;  Laterality: N/A;   Patient Active Problem List   Diagnosis Date Noted   Depression 01/01/2023   Bipolar disease, chronic  (HCC) 06/20/2021   Slurred speech 02/26/2021   Autism 12/20/2020   Multiple sclerosis (HCC) 10/11/2020   MRI of brain abnormal 10/11/2020   Right sided weakness 10/11/2020   Gait disturbance 10/11/2020   Asperger syndrome 03/19/2013    ONSET DATE: 12/16/2022  REFERRING DIAG: R53.1 (ICD-10-CM) - Right sided weakness  G35 (ICD-10-CM) - Multiple sclerosis (HCC)  THERAPY DIAG:  Muscle weakness (generalized)  Other lack of coordination  Rationale for Evaluation and Treatment: Rehabilitation  SUBJECTIVE:   SUBJECTIVE STATEMENT:  Patient reports going to the gym daily x20 minutes and working with putty regularly.  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   Pt accompanied by: self  PERTINENT HISTORY:   "MS HISTORY: He has a h/o Asperger syndrome and bipolar disease.   In 2012, he had an MRI of the brain and was found to have multiple T2/FLAIR hyperintense foci in the brain.   A lumbar puncture at the time was negative.    Over the last year, he began to have more difficulty with right hand coordination and left foot drop.    Also the right side of his face was  drooping some.     He came in to see Dr. Marjory Lies and had repeat imaging studies showing progression of the white matter foci and the appearance of a small focus at C5.    Repeat LP, however, showed negative CSF (no OCB) again.   He started glatiramer 10/2020."  Patient has been receiving PT services and primary therapist recommended OT evaluation due to R UE deficits.   PRECAUTIONS: Fall  WEIGHT BEARING RESTRICTIONS: No  PAIN:  Are you having pain? No  FALLS: Has patient fallen in last 6 months? No  LIVING ENVIRONMENT: Lives with: lives alone Lives in: House/apartment Stairs: Yes: External: 2 steps;  on right going up (which is his bad hand) but he also has a ramp he can use with 2 rails Has following equipment at home: Grab bars and Ramped entry  PLOF: Independent with basic ADLs, Independent with gait, and still driving.  Reports considering paying to have groceries delivered  PATIENT GOALS: Want to be able to use his hand better ie) typing, playing piano (but does report that this that might be unrealistic).  OBJECTIVE:   HAND DOMINANCE: Left  ADLs: Overall ADLs: Patient lives alone and manages his own ADLs Transfers/ambulation related to ADLs: MI - PT has been wondering about an AFO for safety without mobility on uneven surfaces. Eating: MI Grooming: MI UB Dressing: MI - difficulty with buttons  LB Dressing: MI Toileting: MI  Bathing: MI Tub Shower transfers: MI Equipment: Grab bars  IADLs: Shopping: MI but is interested in getting information about getting delivery services Light housekeeping: performs himself but reports his home is dusty Meal Prep: [employer] mobility: MI with driving and ambulates without AE Medication management: MI Financial management: Independent Handwriting:  L handed  MOBILITY STATUS: Independent and difficulty carrying objections with ambulation  POSTURE COMMENTS:  rounded shoulders and forward head Sitting balance: No deficits  ACTIVITY TOLERANCE: Activity tolerance: Fair - patient reports   FUNCTIONAL OUTCOME MEASURES: Evaluation 01/06/23 Quick Dash: 40.9  UPPER EXTREMITY ROM:    Active ROM Right eval Left eval  Shoulder flexion Decreased end range [facility]  Shoulder abduction Uncomfortable > 90   Shoulder adduction    Shoulder extension    Shoulder internal rotation Uncomfortable    Shoulder external rotation    Elbow flexion    Elbow extension    Wrist flexion    Wrist extension    Wrist ulnar deviation    Wrist radial deviation    Wrist pronation    Wrist supination Limited end range   (Blank rows = not tested)  UPPER  EXTREMITY MMT:     MMT Right eval Left eval  Shoulder flexion 4-/5 5/5  Shoulder abduction    Shoulder adduction    Shoulder extension    Shoulder internal rotation    Shoulder external rotation    Middle trapezius    Lower trapezius    Elbow flexion    Elbow extension    Wrist flexion    Wrist extension    Wrist ulnar deviation    Wrist radial deviation    Wrist pronation    Wrist supination    (Blank rows = not tested)  HAND FUNCTION:  2 Trials conducted Grip strength: Right: 34.3 - 42.5  lbs; Left: 102 -104  lbs  COORDINATION: Box and Blocks:  Right 26 blocks, Left 57 blocks  SENSATION: Light touch: Impaired Some tingling in palmar surface  EDEMA: slight  MUSCLE TONE: RUE: tremors and LUE: Within  functional limits  COGNITION: Overall cognitive status: Within functional limits for tasks assessed  VISION: Subjective report: He does reports some difficulty with L eye  Baseline vision:  WFL Visual history: NA  VISION ASSESSMENT: Not tested  Patient has difficulty with some vision on L side by self report.  PERCEPTION: WFL  PRAXIS: WFL  EVALUATION OBSERVATIONS: Patient is a pleasant man with obvious R UE limitations in strength and coordination.  He is L handed but B tasks are challenging and is experiencing more tremors in R UE.  He is motivated to improve UE skills but worried about his symptoms - even concerned that they are indicative of Parkinson's and that his depression is a problem too but more situational than chemical as he says he feels better when he is talking to someone.   TODAY'S TREATMENT:                                                                                                                               Therapeutic Activities:  Reassessed goals with patient today: Grip Strength RUE Average strength: 47.7 lbs; with 3 Trials: 42.3, 49.8, 51.1 (with success at 1 squeeze greater than 50 lbs).  L UE strength is still > 100 lbs with  great effort  Box and Blocks - 27 blocks (only improved by 1 block)  Reviewed further AE considerations and future needs re: resources etc.  AE options explored on [employer] for consideration with patient encouraged to list items in his resource book/binder.   PATIENT EDUCATION: Education details: Resources - ie) MS Support Group Person educated: Patient Education method: Explanation Education comprehension: verbalized understanding and needs further education  HOME EXERCISE PROGRAM: 01/06/23 Table Top Towel Slides -- Access Code: [email or web address] 01/13/23 Coordination Exercises and Activities 01/20/23 Putty Exercises -- Access Code: [email or web address] 02/03/23 Energy Conservation Techniques with detailed Strategy handout with images  GOALS: Goals reviewed with patient? Yes  SHORT TERM GOALS: Target date: 02/07/23  Patient will demonstrate at least 45 lbs L grip strength as needed to open jars and other containers.  Baseline: 38.4 lbs average Goal status: MET 02/25/23 Avg: 47.7 lbs; 3 Trials: 42.3, 49.8, 51.1  2.  Pt will be able to place at least 30 blocks using right hand with completion of Box and Blocks test.  Baseline: 26 blocks Goal status: IN PROGRESS  02/24/23: 27 blocks  3.  Pt will be aware of AE options to help with ADLs ie) washing back, buttons, cutting food etc. Baseline: In need of education re: modifications for areas of deficit Goal status: IN PROGRESS  4.  Pt will be able to carry 5 lbs with R hand with modified grasp/hold technique to carry groceries etc indoors. Baseline: Uses L hand and tires easily Goal status: IN PROGRESS  5.  Patient will demonstrate coordination/strength HEP with 25% verbal cues or less for proper execution.  Baseline: No UE HEP Goal status: MET 02/17/23 - Patient is doing his  HEP - putty and coordination and can rotate ball etc   LONG TERM GOALS: Target date: 03/07/23  Patient will demonstrate at least 50 lbs R grip strength as needed to open jars and other  containers.  Baseline: 38.4 lbs average Goal status: IN PROGRESS 02/25/23 Avg: 47.7 lbs; 3 Trials: 42.3, 49.8, 51.1  2.  Pt will be able to place at least 35 blocks using right hand with completion of Box and Blocks test.  Baseline: 26 blocks Goal status: IN PROGRESS 02/24/23: 27 blocks  3.  Patient will demonstrate independent HEP with no cues. Baseline: No UE HEP Goal status: MET  4.  Patient will demonstrate at least 10% improvement with quick Dash score (reporting 30% disability or less) indicating improved functional use of affected extremity.  Baseline: 40.9 point Goal status: IN PROGRESS  5.  Patient will recall 3 energy conservation ideas and have access to Patient Education handouts for energy conservation techniques. Baseline: None in place Goal status: IN PROGRESS 02/03/23 - Handouts printed and provided   ASSESSMENT:  CLINICAL IMPRESSION:  Patient seen today for occupational therapy treatment for ongoing progression of UE strength, coordination and use of R UE as well as compensatory tasks.  He has made improvements in strength and coordination for use of BUEs.  He continues to benefit from skilled OT services to provide education and adaptations for deficits r/t MS and to complete education re future adaptive equipment and/or splinting to help him as needed.  PERFORMANCE DEFICITS: in functional skills including ADLs, IADLs, coordination, dexterity, sensation, tone, ROM, strength, Fine motor control, Gross motor control, and UE functional use, cognitive skills including emotional and temperament/personality, and psychosocial skills including coping strategies and interpersonal interactions.   IMPAIRMENTS: are limiting patient from ADLs, IADLs, rest and sleep, leisure, and social participation.   CO-MORBIDITIES: has co-morbidities such as MS, Asperger and depression  that affects occupational performance. Patient will benefit from skilled OT to address above impairments and  improve overall function.  REHAB POTENTIAL: Good   PLAN:  OT FREQUENCY: 1x/week  OT DURATION: 8 weeks  PLANNED INTERVENTIONS: self care/ADL training, therapeutic exercise, therapeutic activity, neuromuscular re-education, manual therapy, splinting, patient/family education, energy conservation, and coping strategies training  RECOMMENDED OTHER SERVICES: Patient is recently discharged from PT services and regularly attends counseling with ongoing psych services.  May consider social stories as a coping mechanism to areas causing depression.  CONSULTED AND AGREED WITH PLAN OF CARE: Patient  PLAN FOR NEXT SESSION:   Reprint older HEPs  Retest goals and see about continued therapy vs DC  Ongoing MS education.   Victorino Sparrow, OT 02/24/2023, 4:58 PM

## 2023-02-25 DIAGNOSIS — F319 Bipolar disorder, unspecified: Secondary | ICD-10-CM | POA: Diagnosis not present

## 2023-02-27 DIAGNOSIS — E291 Testicular hypofunction: Secondary | ICD-10-CM | POA: Diagnosis not present

## 2023-03-03 ENCOUNTER — Telehealth: Payer: Self-pay | Admitting: Neurology

## 2023-03-03 ENCOUNTER — Ambulatory Visit: Payer: PPO | Admitting: Occupational Therapy

## 2023-03-03 DIAGNOSIS — M6281 Muscle weakness (generalized): Secondary | ICD-10-CM

## 2023-03-03 DIAGNOSIS — R278 Other lack of coordination: Secondary | ICD-10-CM | POA: Diagnosis not present

## 2023-03-03 DIAGNOSIS — G35 Multiple sclerosis: Secondary | ICD-10-CM

## 2023-03-03 NOTE — Telephone Encounter (Signed)
Pt called and LVM wanting to speak to the provider regarding a new medication that has come out for phase two MS. Please call to discuss.

## 2023-03-03 NOTE — Telephone Encounter (Signed)
Called pt back and he stated that there are 2 stages to MS and he wants to know about Tiziania and wether Dr. Epimenio Foot has heard of this medication and if so he wants to know more information about it and if he can take it.

## 2023-03-03 NOTE — Therapy (Unsigned)
OUTPATIENT OCCUPATIONAL THERAPY NEURO TREATMENT  Patient Name: George [name] MRN: [id number] DOB:August 21, 1970, 52 y.o., male Today's Date: 03/03/2023  PCP: Emilio Aspen, MD REFERRING PROVIDER: Asa Lente, MD  END OF SESSION:  OT End of Session - 03/03/23 1019     Visit Number 8    Number of Visits 9    Date for OT Re-Evaluation 03/07/23    Authorization Type [employer] PPO: Follows Medicare guidelines    Progress Note Due on Visit 9    OT Start Time 1017    OT Stop Time 1102    OT Time Calculation (min) 45 min    Activity Tolerance Patient tolerated treatment well    Behavior During Therapy WFL for tasks assessed/performed             Past Medical History:  Diagnosis Date   Anxiety    Asperger's syndrome    Autistic spectrum disorder    Asperger's (per H&P Olivia Clelland, PA)   Bipolar 1 disorder (HCC)    "no mood swings in 20 years"  on medication   GERD (gastroesophageal reflux disease)    Head injury 2020   Hypertension    Hypothyroidism    IBS (irritable bowel syndrome)    Past Surgical History:  Procedure Laterality Date   NO PAST SURGERIES     RADIOLOGY WITH ANESTHESIA N/A 05/16/2020   Procedure: MRI BRAIN WITH AND WITHOUT CONTRAST;  Surgeon: Radiologist, Medication, MD;  Location: [facility] OR;  Service: Radiology;  Laterality: N/A;   RADIOLOGY WITH ANESTHESIA N/A 08/29/2020   Procedure: MRI WITH ANESTHESIA CERVICAL SPINE WITH AND WITHOUT CONTRAST, THORACIC SPINE WITH AND WITHOUT CONTRAST;  Surgeon: Radiologist, Medication, MD;  Location: [facility] OR;  Service: Radiology;  Laterality: N/A;   RADIOLOGY WITH ANESTHESIA N/A 03/15/2021   Procedure: MRI BRAIN WITH AND WITHOUT, CERIVAL WITH AND WITHOUT;  Surgeon: Radiologist, Medication, MD;  Location: [facility] OR;  Service: Radiology;  Laterality: N/A;   Patient Active Problem List   Diagnosis Date Noted   Depression 01/01/2023   Bipolar disease, chronic (HCC) 06/20/2021   Slurred speech 02/26/2021    Autism 12/20/2020   Multiple sclerosis (HCC) 10/11/2020   MRI of brain abnormal 10/11/2020   Right sided weakness 10/11/2020   Gait disturbance 10/11/2020   Asperger syndrome 03/19/2013    ONSET DATE: 12/16/2022  REFERRING DIAG: R53.1 (ICD-10-CM) - Right sided weakness  G35 (ICD-10-CM) - Multiple sclerosis (HCC)  THERAPY DIAG:  Muscle weakness (generalized)  Other lack of coordination  MS (multiple sclerosis) (HCC)  Rationale for Evaluation and Treatment: Rehabilitation  SUBJECTIVE:   SUBJECTIVE STATEMENT:  Patient reports going to the gym daily x20 minutes and working with putty regularly.  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   MRI - no new lesions   Pt accompanied by: self  PERTINENT HISTORY:   "MS HISTORY: He has a h/o Asperger syndrome and bipolar disease.   In 2012, he had an MRI of the brain and was found to have multiple T2/FLAIR hyperintense foci in the brain.   A lumbar puncture at the time was negative.    Over the last year, he began to have more difficulty with right hand coordination and left foot drop.    Also the right side of  his face was drooping some.     He came in to see Dr. Marjory Lies and had repeat imaging studies showing progression of the white matter foci and the appearance of a small focus at C5.    Repeat LP, however, showed negative CSF (no OCB) again.   He started glatiramer 10/2020."  Patient has been receiving PT services and primary therapist recommended OT evaluation due to R UE deficits.   PRECAUTIONS: Fall  WEIGHT BEARING RESTRICTIONS: No  PAIN:  Are you having pain? No  FALLS: Has patient fallen in last 6 months? No  LIVING ENVIRONMENT: Lives with: lives alone Lives in: House/apartment Stairs: Yes:  External: 2 steps; on right going up (which is his bad hand) but he also has a ramp he can use with 2 rails Has following equipment at home: Grab bars and Ramped entry  PLOF: Independent with basic ADLs, Independent with gait, and still driving.  Reports considering paying to have groceries delivered  PATIENT GOALS: Want to be able to use his hand better ie) typing, playing piano (but does report that this that might be unrealistic).  OBJECTIVE:   HAND DOMINANCE: Left  ADLs: Overall ADLs: Patient lives alone and manages his own ADLs Transfers/ambulation related to ADLs: MI - PT has been wondering about an AFO for safety without mobility on uneven surfaces. Eating: MI Grooming: MI UB Dressing: MI - difficulty with buttons  LB Dressing: MI Toileting: MI  Bathing: MI Tub Shower transfers: MI Equipment: Grab bars  IADLs: Shopping: MI but is interested in getting information about getting delivery services Light housekeeping: performs himself but reports his home is dusty Meal Prep: [employer] mobility: MI with driving and ambulates without AE Medication management: MI Financial management: Independent Handwriting:  L handed  MOBILITY STATUS: Independent and difficulty carrying objections with ambulation  POSTURE COMMENTS:  rounded shoulders and forward head Sitting balance: No deficits  ACTIVITY TOLERANCE: Activity tolerance: Fair - patient reports   FUNCTIONAL OUTCOME MEASURES: Evaluation 01/06/23 Quick Dash: 40.9  UPPER EXTREMITY ROM:    Active ROM Right eval Left eval  Shoulder flexion Decreased end range [facility]  Shoulder abduction Uncomfortable > 90   Shoulder adduction    Shoulder extension    Shoulder internal rotation Uncomfortable    Shoulder external rotation    Elbow flexion    Elbow extension    Wrist flexion    Wrist extension    Wrist ulnar deviation    Wrist radial deviation    Wrist pronation    Wrist supination Limited end range   (Blank rows =  not tested)  UPPER EXTREMITY MMT:     MMT Right eval Left eval  Shoulder flexion 4-/5 5/5  Shoulder abduction    Shoulder adduction    Shoulder extension    Shoulder internal rotation    Shoulder external rotation    Middle trapezius    Lower trapezius    Elbow flexion    Elbow extension    Wrist flexion    Wrist extension    Wrist ulnar deviation    Wrist radial deviation    Wrist pronation    Wrist supination    (Blank rows = not tested)  HAND FUNCTION:  2 Trials conducted Grip strength: Right: 34.3 - 42.5  lbs; Left: 102 -104  lbs  COORDINATION: Box and Blocks:  Right 26 blocks, Left 57 blocks  SENSATION: Light touch: Impaired Some tingling in palmar surface  EDEMA: slight  MUSCLE TONE: RUE: tremors  and LUE: Within functional limits  COGNITION: Overall cognitive status: Within functional limits for tasks assessed  VISION: Subjective report: He does reports some difficulty with L eye  Baseline vision:  WFL Visual history: NA  VISION ASSESSMENT: Not tested  Patient has difficulty with some vision on L side by self report.  PERCEPTION: WFL  PRAXIS: WFL  EVALUATION OBSERVATIONS: Patient is a pleasant man with obvious R UE limitations in strength and coordination.  He is L handed but B tasks are challenging and is experiencing more tremors in R UE.  He is motivated to improve UE skills but worried about his symptoms - even concerned that they are indicative of Parkinson's and that his depression is a problem too but more situational than chemical as he says he feels better when he is talking to someone.   TODAY'S TREATMENT:                                                                                                                               Therapeutic Activities:  Reassessed goals with patient today: Grip Strength RUE Average strength: 47.7 lbs; with 3 Trials: 42.3, 49.8, 51.1 (with success at 1 squeeze greater than 50 lbs).  L UE strength is  still > 100 lbs with great effort  Box and Blocks - 27 blocks (only improved by 1 block)  Reviewed further AE considerations and future needs re: resources etc.  AE options explored on [employer] for consideration with patient encouraged to list items in his resource book/binder.   PATIENT EDUCATION: Education details: Resources - ie) MS Support Group Person educated: Patient Education method: Explanation Education comprehension: verbalized understanding and needs further education  HOME EXERCISE PROGRAM: 01/06/23 Table Top Towel Slides -- Access Code: [email or web address] 01/13/23 Coordination Exercises and Activities 01/20/23 Putty Exercises -- Access Code: [email or web address] 02/03/23 Energy Conservation Techniques with detailed Strategy handout with images  GOALS: Goals reviewed with patient? Yes  SHORT TERM GOALS: Target date: 02/07/23  Patient will demonstrate at least 45 lbs R grip strength as needed to open jars and other containers.  Baseline: 38.4 lbs average Goal status: MET 02/25/23 Avg: 47.7 lbs; 3 Trials: 42.3, 49.8, 51.1  2.  Pt will be able to place at least 30 blocks using right hand with completion of Box and Blocks test.  Baseline: 26 blocks Goal status: IN PROGRESS  02/24/23: 27 blocks  3.  Pt will be aware of AE options to help with ADLs ie) washing back, buttons, cutting food etc. Baseline: In need of education re: modifications for areas of deficit Goal status: IN PROGRESS  4.  Pt will be able to carry 5 lbs with R hand with modified grasp/hold technique to carry groceries etc indoors. Baseline: Uses L hand and tires easily Goal status: IN PROGRESS  5.  Patient will demonstrate coordination/strength HEP with 25% verbal cues or less for proper execution.  Baseline: No UE HEP Goal status: MET 02/17/23 - Patient  is doing his HEP - putty and coordination and can rotate ball etc   LONG TERM GOALS: Target date: 03/07/23  Patient will demonstrate at least 50 lbs R grip strength as needed to  open jars and other containers.  Baseline: 38.4 lbs average Goal status: IN PROGRESS 02/25/23 Avg: 47.7 lbs; 3 Trials: 42.3, 49.8, 51.1  2.  Pt will be able to place at least 35 blocks using right hand with completion of Box and Blocks test.  Baseline: 26 blocks Goal status: IN PROGRESS 02/24/23: 27 blocks  3.  Patient will demonstrate independent HEP with no cues. Baseline: No UE HEP Goal status: MET  4.  Patient will demonstrate at least 10% improvement with quick Dash score (reporting 30% disability or less) indicating improved functional use of affected extremity.  Baseline: 40.9 point Goal status: IN PROGRESS  5.  Patient will recall 3 energy conservation ideas and have access to Patient Education handouts for energy conservation techniques. Baseline: None in place Goal status: IN PROGRESS 02/03/23 - Handouts printed and provided   ASSESSMENT:  CLINICAL IMPRESSION:  Patient seen today for occupational therapy treatment for ongoing progression of UE strength, coordination and use of R UE as well as compensatory tasks.  He has made improvements in strength and coordination for use of BUEs.  He continues to benefit from skilled OT services to provide education and adaptations for deficits r/t MS and to complete education re future adaptive equipment and/or splinting to help him as needed.  PERFORMANCE DEFICITS: in functional skills including ADLs, IADLs, coordination, dexterity, sensation, tone, ROM, strength, Fine motor control, Gross motor control, and UE functional use, cognitive skills including emotional and temperament/personality, and psychosocial skills including coping strategies and interpersonal interactions.   IMPAIRMENTS: are limiting patient from ADLs, IADLs, rest and sleep, leisure, and social participation.   CO-MORBIDITIES: has co-morbidities such as MS, Asperger and depression  that affects occupational performance. Patient will benefit from skilled OT to address  above impairments and improve overall function.  REHAB POTENTIAL: Good   PLAN:  OT FREQUENCY: 1x/week  OT DURATION: 8 weeks  PLANNED INTERVENTIONS: self care/ADL training, therapeutic exercise, therapeutic activity, neuromuscular re-education, manual therapy, splinting, patient/family education, energy conservation, and coping strategies training  RECOMMENDED OTHER SERVICES: Patient is recently discharged from PT services and regularly attends counseling with ongoing psych services.  May consider social stories as a coping mechanism to areas causing depression.  CONSULTED AND AGREED WITH PLAN OF CARE: Patient  PLAN FOR NEXT SESSION:   Reprint older HEPs  Retest goals and see about continued therapy vs DC  Ongoing MS education.   Victorino Sparrow, OT 03/03/2023, 1:57 PM

## 2023-03-05 DIAGNOSIS — F319 Bipolar disorder, unspecified: Secondary | ICD-10-CM | POA: Diagnosis not present

## 2023-03-07 DIAGNOSIS — G35 Multiple sclerosis: Secondary | ICD-10-CM | POA: Diagnosis not present

## 2023-03-07 DIAGNOSIS — F319 Bipolar disorder, unspecified: Secondary | ICD-10-CM | POA: Diagnosis not present

## 2023-03-07 DIAGNOSIS — I10 Essential (primary) hypertension: Secondary | ICD-10-CM | POA: Diagnosis not present

## 2023-03-07 DIAGNOSIS — Z8249 Family history of ischemic heart disease and other diseases of the circulatory system: Secondary | ICD-10-CM | POA: Diagnosis not present

## 2023-03-10 NOTE — Telephone Encounter (Signed)
Pt called wanting to know what provider would suggest. Pt states he has yet to receive a call back with answer.

## 2023-03-11 NOTE — Telephone Encounter (Signed)
Called pt and let him know that Dr. Epimenio Foot said "this is a drug that is in early clinical studies and only of small number of patients at 1 site in [place].  Therefore, it is not available.  Most drugs take about 8 years to go from initial small studies to be commercially available.  Currently there is no data in humans to know whether or not it will be of benefit" Pt stated that his mother told him to not bet a "test subject" for the Tiziania medication. 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 Told pt if he has any more symptoms to please reach out to [place].

## 2023-03-12 DIAGNOSIS — F319 Bipolar disorder, unspecified: Secondary | ICD-10-CM | POA: Diagnosis not present

## 2023-03-13 DIAGNOSIS — E291 Testicular hypofunction: Secondary | ICD-10-CM | POA: Diagnosis not present

## 2023-03-17 DIAGNOSIS — L918 Other hypertrophic disorders of the skin: Secondary | ICD-10-CM | POA: Diagnosis not present

## 2023-03-17 DIAGNOSIS — B372 Candidiasis of skin and nail: Secondary | ICD-10-CM | POA: Diagnosis not present

## 2023-03-17 DIAGNOSIS — R000 Tachycardia, unspecified: Secondary | ICD-10-CM | POA: Diagnosis not present

## 2023-03-19 DIAGNOSIS — F319 Bipolar disorder, unspecified: Secondary | ICD-10-CM | POA: Diagnosis not present

## 2023-03-20 DIAGNOSIS — H5213 Myopia, bilateral: Secondary | ICD-10-CM | POA: Diagnosis not present

## 2023-03-20 DIAGNOSIS — H532 Diplopia: Secondary | ICD-10-CM | POA: Diagnosis not present

## 2023-03-24 ENCOUNTER — Telehealth: Payer: Self-pay | Admitting: Neurology

## 2023-03-24 NOTE — Telephone Encounter (Signed)
Patient called and stated "this is not urgent" but wanted to inform provider that he is having dizzy spells that last a few hours but are not bad enough to stop his daily living, I advised follow up with PCP or urgent care since he states they have been coming and going for days now, he stated no and that he just wanted to keep [place] updated. No med changes or anything were noted and no meds taken when these spells occur. Please advise if needed

## 2023-03-24 NOTE — Telephone Encounter (Signed)
Pt called and LVM stating that lately he has been suffering dizzy spells and he is wanting to know if this could be related to his MS. Please advise.

## 2023-03-26 DIAGNOSIS — F319 Bipolar disorder, unspecified: Secondary | ICD-10-CM | POA: Diagnosis not present

## 2023-03-28 DIAGNOSIS — E291 Testicular hypofunction: Secondary | ICD-10-CM | POA: Diagnosis not present

## 2023-04-02 DIAGNOSIS — R351 Nocturia: Secondary | ICD-10-CM | POA: Diagnosis not present

## 2023-04-02 DIAGNOSIS — F319 Bipolar disorder, unspecified: Secondary | ICD-10-CM | POA: Diagnosis not present

## 2023-04-08 ENCOUNTER — Other Ambulatory Visit: Payer: Self-pay

## 2023-04-08 ENCOUNTER — Encounter (HOSPITAL_BASED_OUTPATIENT_CLINIC_OR_DEPARTMENT_OTHER): Payer: Self-pay | Admitting: Emergency Medicine

## 2023-04-08 ENCOUNTER — Other Ambulatory Visit (HOSPITAL_BASED_OUTPATIENT_CLINIC_OR_DEPARTMENT_OTHER): Payer: Self-pay

## 2023-04-08 ENCOUNTER — Emergency Department (HOSPITAL_BASED_OUTPATIENT_CLINIC_OR_DEPARTMENT_OTHER)
Admission: EM | Admit: 2023-04-08 | Discharge: 2023-04-08 | Disposition: A | Discharge status: Home / Self Care | Payer: PPO | Attending: Emergency Medicine | Admitting: Emergency Medicine

## 2023-04-08 DIAGNOSIS — I10 Essential (primary) hypertension: Secondary | ICD-10-CM | POA: Insufficient documentation

## 2023-04-08 DIAGNOSIS — S39012A Strain of muscle, fascia and tendon of lower back, initial encounter: Secondary | ICD-10-CM

## 2023-04-08 DIAGNOSIS — Z79890 Hormone replacement therapy (postmenopausal): Secondary | ICD-10-CM | POA: Insufficient documentation

## 2023-04-08 DIAGNOSIS — Z79899 Other long term (current) drug therapy: Secondary | ICD-10-CM | POA: Diagnosis not present

## 2023-04-08 DIAGNOSIS — E039 Hypothyroidism, unspecified: Secondary | ICD-10-CM | POA: Insufficient documentation

## 2023-04-08 DIAGNOSIS — M545 Low back pain, unspecified: Secondary | ICD-10-CM | POA: Diagnosis present

## 2023-04-08 DIAGNOSIS — F840 Autistic disorder: Secondary | ICD-10-CM | POA: Diagnosis not present

## 2023-04-08 DIAGNOSIS — X500XXA Overexertion from strenuous movement or load, initial encounter: Secondary | ICD-10-CM | POA: Insufficient documentation

## 2023-04-08 MED ORDER — CYCLOBENZAPRINE HCL 5 MG PO TABS
5.0000 mg | ORAL_TABLET | Freq: Once | ORAL | Status: AC
  Administered 2023-04-08: 5 mg via ORAL
  Filled 2023-04-08: qty 1

## 2023-04-08 MED ORDER — ACETAMINOPHEN 325 MG PO TABS
650.0000 mg | ORAL_TABLET | Freq: Once | ORAL | Status: AC
  Administered 2023-04-08: 650 mg via ORAL
  Filled 2023-04-08: qty 2

## 2023-04-08 MED ORDER — CYCLOBENZAPRINE HCL 10 MG PO TABS
10.0000 mg | ORAL_TABLET | Freq: Two times a day (BID) | ORAL | 0 refills | Status: AC | PRN
  Filled 2023-04-08: qty 20, 10d supply, fill #0

## 2023-04-08 MED ORDER — LIDOCAINE 5 % EX PTCH
1.0000 | MEDICATED_PATCH | CUTANEOUS | Status: DC
  Administered 2023-04-08: 1 via TRANSDERMAL
  Filled 2023-04-08: qty 1

## 2023-04-08 MED ORDER — KETOROLAC TROMETHAMINE 60 MG/2ML IM SOLN
30.0000 mg | Freq: Once | INTRAMUSCULAR | Status: AC
  Administered 2023-04-08: 30 mg via INTRAMUSCULAR
  Filled 2023-04-08: qty 2

## 2023-04-08 NOTE — Discharge Instructions (Addendum)
Your presentation is consistent with likely lower lumbar back muscle strain.  Recommend rest, intermittent ice and heating pad, extra strength Tylenol and OTC lidocaine patches for pain control.  NSAIDs like ibuprofen and naproxen can also be utilized for short course for pain control and inflammation however long-term use can contribute to worsening kidney function and the formation of stomach ulcers. Do not drive or operate heavy machinery while taking Flexeril.

## 2023-04-08 NOTE — ED Provider Notes (Signed)
[place] [facility] AT [facility] Provider Note   CSN: [id number] Arrival date & time: 04/08/23  0800     History  Chief Complaint  Patient presents with   Back Pain    George [name] is a 52 y.o. male.  HPI   52 year old male with medical history significant for autism spectrum disorder, anxiety, hypertension, hypothyroidism, bipolar disorder, IBS, GERD, MS who presents to the emergency department with back pain.  Patient states that several days ago he was lifting heavy equipment to include scaffolding and strained a muscle in his back.  The pain had been improving however over the last day it recurred.  He endorses pain in his right lower back, worse with twisting and engaging the musculature of his back.  He denies any falls or trauma.  He denies any fevers or IV drug abuse.  He denies any saddle anesthesia.  No urinary or fecal incontinence.  He has right sided hemibody deficits chronically from his MS, denies any new or worsening symptoms.  He thought he potentially had an issue with weakness in his left leg but on reconsideration realized that he when moving and lifting his left leg he was engaging his back musculature and the pain was limiting his movement.  He does not feel that his symptoms are consistent with an MS flare.  He denies any new vision changes, double vision, blurry vision, vision loss, any new focal weakness or numbness.  Home Medications Prior to Admission medications   Medication Sig Start Date End Date Taking? Authorizing Provider  cyclobenzaprine (FLEXERIL) 10 MG tablet Take 1 tablet (10 mg total) by mouth 2 (two) times daily as needed for muscle spasms. 04/08/23  Yes Ernie Avena, MD  amLODipine-valsartan (EXFORGE) 5-160 MG tablet Take 1 tablet by mouth in the morning. Patient not taking: Reported on 02/24/2023 12/20/20   [provider]  amLODipine-valsartan (EXFORGE) 5-320 MG tablet Take 1 tablet by mouth daily.    [provider]  busPIRone (BUSPAR) 10 MG tablet Take 20 mg by mouth 2 (two) times daily. 03/18/20   [provider]  Cholecalciferol (VITAMIN D3) 50 MCG (2000 UT) TABS Take 2,000 Units by mouth in the morning.    [provider]  COPAXONE 40 MG/ML SOSY Inject 40mg  subcutaneously three (3) times per week 12/18/22   Sater, Pearletha Furl, MD  Cyanocobalamin (VITAMIN B-12) 5000 MCG TBDP Place 5,000 Units under the tongue 2 (two) times a week.    [provider]  diazepam (VALIUM) 5 MG tablet Take one or two before MRi 01/23/23   Sater, Pearletha Furl, MD  divalproex (DEPAKOTE) 250 MG DR tablet Take 1,250 mg by mouth daily.     [provider]  Multiple Vitamin (MULTIVITAMIN WITH MINERALS) TABS tablet Take 1 tablet by mouth in the morning.    [provider]  SYNTHROID 100 MCG tablet Take 100 mcg by mouth daily before breakfast.  05/19/16   [provider]  testosterone enanthate (DELATESTRYL) 200 MG/ML injection Inject 150 mg into the muscle every 14 (fourteen) days. For IM use only  Every three weeks    [provider]      Allergies    Other    Review of Systems   Review of Systems  All other systems reviewed and are negative.   Physical Exam Updated Vital Signs BP 122/86 (BP Location: Right Arm)   Pulse 73   Temp 98.1 F (36.7 C) (Oral)   Resp 16  Ht 5' 7.5" (1.715 m)   Wt 73 kg   SpO2 100%   BMI 24.84 kg/m  Physical Exam Vitals and nursing note reviewed.  Constitutional:      General: He is not in acute distress. HENT:     Head: Normocephalic and atraumatic.  Eyes:     Conjunctiva/sclera: Conjunctivae normal.     Pupils: Pupils are equal, round, and reactive to light.  Cardiovascular:     Rate and Rhythm: Normal rate and regular rhythm.  Pulmonary:     Effort: Pulmonary effort is normal. No respiratory distress.  Abdominal:     General: There is no distension.     Tenderness: There is no guarding.  Musculoskeletal:         General: No deformity or signs of injury.     Cervical back: Neck supple.     Comments: No midline tenderness of the cervical, thoracic or lumbar spine  Skin:    Findings: No lesion or rash.  Neurological:     General: No focal deficit present.     Mental Status: He is alert. Mental status is at baseline.     Comments: MENTAL STATUS EXAM:    Orientation: Alert and oriented to person, place and time.  Memory: Cooperative, follows commands well.  Language: Speech is clear and language is normal.   CRANIAL NERVES:    CN 2 (Optic): Visual fields intact to confrontation.  CN 3,4,6 (EOM): Pupils equal and reactive to light. Full extraocular eye movement without nystagmus.  CN 5 (Trigeminal): Facial sensation is normal, no weakness of masticatory muscles.  CN 7 (Facial): RFD present, chronic CN 8 (Auditory): Auditory acuity grossly normal.  CN 9,10 (Glossophar): The uvula is midline, the palate elevates symmetrically.  CN 11 (spinal access): Normal sternocleidomastoid and trapezius strength.  CN 12 (Hypoglossal): The tongue is midline. No atrophy or fasciculations.Marland Kitchen   MOTOR:  Muscle Strength: 4/5RUE, 5/5LUE, 4/5RLE, 5/5LLE.   COORDINATION:   No tremor.   SENSATION:   Intact to light touch all four extremities.        ED Results / Procedures / Treatments   Labs (all labs ordered are listed, but only abnormal results are displayed) Labs Reviewed - No data to display  EKG None  Radiology No results found.  Procedures Procedures    Medications Ordered in ED Medications  lidocaine (LIDODERM) 5 % 1 patch (1 patch Transdermal Patch Applied 04/08/23 0853)  acetaminophen (TYLENOL) tablet 650 mg (650 mg Oral Given 04/08/23 0841)  ketorolac (TORADOL) injection 30 mg (30 mg Intramuscular Given 04/08/23 0844)  cyclobenzaprine (FLEXERIL) tablet 5 mg (5 mg Oral Given 04/08/23 0841)    ED Course/ Medical Decision Making/ A&P                                 Medical Decision  Making Risk OTC drugs. Prescription drug management.    52 year old male with medical history significant for autism spectrum disorder, anxiety, hypertension, hypothyroidism, bipolar disorder, IBS, GERD, MS who presents to the emergency department with back pain.  Patient states that several days ago he was lifting heavy equipment to include scaffolding and strained a muscle in his back.  The pain had been improving however over the last day it recurred.  He endorses pain in his right lower back, worse with twisting and engaging the musculature of his back.  He denies any falls or trauma.  He denies any  fevers or IV drug abuse.  He denies any saddle anesthesia.  No urinary or fecal incontinence.  He has right sided hemibody deficits chronically from his MS, denies any new or worsening symptoms.  He thought he potentially had an issue with weakness in his left leg but on reconsideration realized that he when moving and lifting his left leg he was engaging his back musculature and the pain was limiting his movement.  He does not feel that his symptoms are consistent with an MS flare.  He denies any new vision changes, double vision, blurry vision, vision loss, any new focal weakness or numbness.  On arrival, the patient was vitally stable, afebrile, not tachycardic or tachypneic, BP 122/86, saturating 100% on room air.  Physical exam significant for chronic right hemibody deficits which are known to the patient due to his history of MS.  No new deficits endorsed by the patient.  He continues to endorse right sided low back pain.  Worse with twisting and engagement of the low back musculature.  No radiculopathy.  On my initial exam, the pt was neurologically at baseline for his MS, has no midline tenderness of the spine. Patient endorsing complete sensation of the perineum.  Patient without episodes of fecal or urinary incontinence.  Patient has no new focal neurologic deficits and reassuring vital signs at  this time.  No obvious physical abnormality or injury on exam. Notably, patient denies recent trauma, is afebrile, and denies IVDU.   Reviewed and confirmed nursing documentation for past medical history, family history, social history.    Initial Assessment:   With the patient's presentation of acute back pain in the above setting, most likely diagnosis is musculoskeletal strain. Other diagnoses were considered including (but not limited to) underlying fracture, epidural hematoma, cauda equina syndrome, spinal stenosis, spinal malignancy. These are considered less likely due to history of present illness and physical exam findings.   In particular, lack of fever, substantial history of IV drug use, or substantial neurologic abnormality is less consistent with epidural abscess versus discitis or other spinal infection. In particular,  Initial Plan:  Multimodal pain control described and patient informed on safe usage.  Patient stable for continued outpatient evaluation and management of their musculoskeletal pains.  Patient referred back to primary care provider for continued evaluation and management.   Initial Study Results:   Radiology  No orders to display     Disposition:   Based on the above findings, I believe patient is stable for discharge.    Patient and family educated about specific return precautions for given chief complaint and symptoms.  Patient and family educated about follow-up with his PCP.  Patient and family expressed understanding of return precautions and need for follow-up. Patient spoken to regarding all imaging and laboratory results and appropriate follow up for these results. All education provided in verbal and written form and time was allowed for answering of patient questions. Patient discharged.          Emergency Department Medication Summary:   Medications  lidocaine (LIDODERM) 5 % 1 patch (1 patch Transdermal Patch Applied 04/08/23 0853)   acetaminophen (TYLENOL) tablet 650 mg (650 mg Oral Given 04/08/23 0841)  ketorolac (TORADOL) injection 30 mg (30 mg Intramuscular Given 04/08/23 0844)  cyclobenzaprine (FLEXERIL) tablet 5 mg (5 mg Oral Given 04/08/23 0841)     Final Clinical Impression(s) / ED Diagnoses Final diagnoses:  Strain of lumbar region, initial encounter    Rx / DC Orders ED Discharge  Orders          Ordered    cyclobenzaprine (FLEXERIL) 10 MG tablet  2 times daily PRN        04/08/23 0935              Ernie Avena, MD 04/08/23 [phone]

## 2023-04-08 NOTE — ED Triage Notes (Signed)
Pt arrives pov, steady gait c/o RT side lower back pain after heavy lifting "several days ago". Resolved until yesterday.

## 2023-04-08 NOTE — ED Notes (Signed)
Pt discharged to home using teachback Method. Discharge instructions have been discussed with patient and/or family members. Pt verbally acknowledges understanding d/c instructions, has been given opportunity for questions to be answered, and endorses comprehension to checkout at registration before leaving.  

## 2023-04-11 ENCOUNTER — Other Ambulatory Visit: Payer: Self-pay | Admitting: Internal Medicine

## 2023-04-11 ENCOUNTER — Ambulatory Visit
Admission: RE | Admit: 2023-04-11 | Discharge: 2023-04-11 | Disposition: A | Discharge status: Home / Self Care | Payer: PPO | Source: Ambulatory Visit | Attending: Internal Medicine | Admitting: Internal Medicine

## 2023-04-11 DIAGNOSIS — R109 Unspecified abdominal pain: Secondary | ICD-10-CM

## 2023-04-11 DIAGNOSIS — E291 Testicular hypofunction: Secondary | ICD-10-CM | POA: Diagnosis not present

## 2023-04-11 DIAGNOSIS — K5900 Constipation, unspecified: Secondary | ICD-10-CM | POA: Diagnosis not present

## 2023-04-11 DIAGNOSIS — R350 Frequency of micturition: Secondary | ICD-10-CM | POA: Diagnosis not present

## 2023-04-15 DIAGNOSIS — R799 Abnormal finding of blood chemistry, unspecified: Secondary | ICD-10-CM | POA: Diagnosis not present

## 2023-04-16 DIAGNOSIS — R748 Abnormal levels of other serum enzymes: Secondary | ICD-10-CM | POA: Diagnosis not present

## 2023-04-16 DIAGNOSIS — R109 Unspecified abdominal pain: Secondary | ICD-10-CM | POA: Diagnosis not present

## 2023-04-16 DIAGNOSIS — F319 Bipolar disorder, unspecified: Secondary | ICD-10-CM | POA: Diagnosis not present

## 2023-04-22 ENCOUNTER — Other Ambulatory Visit (HOSPITAL_COMMUNITY): Payer: Self-pay | Admitting: Internal Medicine

## 2023-04-22 ENCOUNTER — Encounter (HOSPITAL_COMMUNITY): Payer: Self-pay | Admitting: Internal Medicine

## 2023-04-22 DIAGNOSIS — R7989 Other specified abnormal findings of blood chemistry: Secondary | ICD-10-CM

## 2023-04-23 DIAGNOSIS — F319 Bipolar disorder, unspecified: Secondary | ICD-10-CM | POA: Diagnosis not present

## 2023-04-24 ENCOUNTER — Other Ambulatory Visit (HOSPITAL_COMMUNITY): Payer: Self-pay | Admitting: Internal Medicine

## 2023-04-24 DIAGNOSIS — R748 Abnormal levels of other serum enzymes: Secondary | ICD-10-CM | POA: Diagnosis not present

## 2023-04-24 DIAGNOSIS — I10 Essential (primary) hypertension: Secondary | ICD-10-CM

## 2023-04-24 DIAGNOSIS — R7989 Other specified abnormal findings of blood chemistry: Secondary | ICD-10-CM

## 2023-04-25 DIAGNOSIS — E291 Testicular hypofunction: Secondary | ICD-10-CM | POA: Diagnosis not present

## 2023-04-25 DIAGNOSIS — R351 Nocturia: Secondary | ICD-10-CM | POA: Diagnosis not present

## 2023-04-30 DIAGNOSIS — F319 Bipolar disorder, unspecified: Secondary | ICD-10-CM | POA: Diagnosis not present

## 2023-05-01 ENCOUNTER — Ambulatory Visit (HOSPITAL_COMMUNITY)
Admission: RE | Admit: 2023-05-01 | Discharge: 2023-05-01 | Disposition: A | Discharge status: Home / Self Care | Payer: PPO | Source: Ambulatory Visit | Attending: Cardiology | Admitting: Cardiology

## 2023-05-01 DIAGNOSIS — I10 Essential (primary) hypertension: Secondary | ICD-10-CM | POA: Diagnosis not present

## 2023-05-01 DIAGNOSIS — R7989 Other specified abnormal findings of blood chemistry: Secondary | ICD-10-CM | POA: Diagnosis not present

## 2023-05-05 ENCOUNTER — Telehealth: Payer: Self-pay | Admitting: Neurology

## 2023-05-05 NOTE — Telephone Encounter (Signed)
Noted  

## 2023-05-05 NOTE — Telephone Encounter (Signed)
Phone room: please call back and let him know its ok to take chia seeds and ok for weekly or biweekly massages

## 2023-05-05 NOTE — Telephone Encounter (Signed)
Pt would like to know if it is ok to take chia seeds for his MS and is it ok for weekly or biweekly massages.

## 2023-05-05 NOTE — Telephone Encounter (Signed)
Pt was called and informed. Pt was thankful.

## 2023-05-07 DIAGNOSIS — F319 Bipolar disorder, unspecified: Secondary | ICD-10-CM | POA: Diagnosis not present

## 2023-05-12 DIAGNOSIS — E291 Testicular hypofunction: Secondary | ICD-10-CM | POA: Diagnosis not present

## 2023-05-13 ENCOUNTER — Telehealth: Payer: Self-pay | Admitting: Neurology

## 2023-05-13 NOTE — Telephone Encounter (Signed)
Pt is asking for a call to discuss that he does not want a sleep study, he doesn't feel that he needs one.  Pt would like a call to discuss how to go about enhancing his sleep quality.

## 2023-05-13 NOTE — Telephone Encounter (Signed)
Called pt and let him know per Dr. Epimenio Foot and Emma,"Dr. Epimenio Foot said Magnesium glycinate (3 per day-350mg  total) ok to take. Pt verbalized understanding.

## 2023-05-13 NOTE — Telephone Encounter (Signed)
Called pt. He is getting about 8hr of sleep per night. Denies snoring/waking up with headaches or excessive fatigue.Exercising twice daily. Someone at [employer] that he goes to recommended Magnesium glycinate (3 per day-350mg  total). Told it would help his sleep. Wants to know if Dr. Epimenio Foot thinks this is a good idea or not. Aware I will send to MD to review.  He reports that during the daytime, sleeping . Feels this is required. Otherwise, he cannot stay awake. Went over good sleep hygiene techniques. Recommended he take only 20-30min naps during the day. Recommended OTC melatonin, he decided to hold off on this option for now. Offered trazodone 50mg  po at bedtime per Dr. Epimenio Foot. He declined at this time.    Reminded him about upcoming appt 07/07/23 at 10am with Dr. Epimenio Foot. He verbalized understanding.   I spoke w/ Dr. Epimenio Foot and he said Magnesium glycinate (3 per day-350mg  total) ok to take.

## 2023-05-14 DIAGNOSIS — F319 Bipolar disorder, unspecified: Secondary | ICD-10-CM | POA: Diagnosis not present

## 2023-05-21 DIAGNOSIS — F319 Bipolar disorder, unspecified: Secondary | ICD-10-CM | POA: Diagnosis not present

## 2023-05-22 DIAGNOSIS — H532 Diplopia: Secondary | ICD-10-CM | POA: Diagnosis not present

## 2023-05-26 DIAGNOSIS — G35 Multiple sclerosis: Secondary | ICD-10-CM | POA: Diagnosis not present

## 2023-05-26 DIAGNOSIS — R739 Hyperglycemia, unspecified: Secondary | ICD-10-CM | POA: Diagnosis not present

## 2023-05-26 DIAGNOSIS — R748 Abnormal levels of other serum enzymes: Secondary | ICD-10-CM | POA: Diagnosis not present

## 2023-05-26 DIAGNOSIS — E291 Testicular hypofunction: Secondary | ICD-10-CM | POA: Diagnosis not present

## 2023-05-28 DIAGNOSIS — F319 Bipolar disorder, unspecified: Secondary | ICD-10-CM | POA: Diagnosis not present

## 2023-06-04 DIAGNOSIS — F319 Bipolar disorder, unspecified: Secondary | ICD-10-CM | POA: Diagnosis not present

## 2023-06-06 DIAGNOSIS — K589 Irritable bowel syndrome without diarrhea: Secondary | ICD-10-CM | POA: Diagnosis not present

## 2023-06-09 DIAGNOSIS — E291 Testicular hypofunction: Secondary | ICD-10-CM | POA: Diagnosis not present

## 2023-06-12 ENCOUNTER — Telehealth: Payer: Self-pay | Admitting: Neurology

## 2023-06-12 NOTE — Telephone Encounter (Signed)
Called pt back to let him know Dr. Epimenio Foot approved placing referral. He will call back to let us know who he would like to be referred to before we place referral

## 2023-06-12 NOTE — Telephone Encounter (Signed)
Dr. Epimenio Foot- are you ok with this or would you like him to discuss with PCP?

## 2023-06-12 NOTE — Telephone Encounter (Signed)
Patient called in stating he would like a referral placed to see a nutritionist due to the diet he is on for his IBS is contradicting the diet he is suppose to be on for MS.

## 2023-06-18 DIAGNOSIS — F319 Bipolar disorder, unspecified: Secondary | ICD-10-CM | POA: Diagnosis not present

## 2023-06-23 DIAGNOSIS — E291 Testicular hypofunction: Secondary | ICD-10-CM | POA: Diagnosis not present

## 2023-06-25 DIAGNOSIS — F319 Bipolar disorder, unspecified: Secondary | ICD-10-CM | POA: Diagnosis not present

## 2023-07-02 DIAGNOSIS — F319 Bipolar disorder, unspecified: Secondary | ICD-10-CM | POA: Diagnosis not present

## 2023-07-07 ENCOUNTER — Encounter: Payer: Self-pay | Admitting: Neurology

## 2023-07-07 ENCOUNTER — Ambulatory Visit: Payer: PPO | Admitting: Neurology

## 2023-07-07 VITALS — BP 142/94 | HR 90 | Ht 67.0 in | Wt 156.5 lb

## 2023-07-07 DIAGNOSIS — F319 Bipolar disorder, unspecified: Secondary | ICD-10-CM

## 2023-07-07 DIAGNOSIS — G35 Multiple sclerosis: Secondary | ICD-10-CM | POA: Diagnosis not present

## 2023-07-07 DIAGNOSIS — R531 Weakness: Secondary | ICD-10-CM | POA: Diagnosis not present

## 2023-07-07 DIAGNOSIS — R269 Unspecified abnormalities of gait and mobility: Secondary | ICD-10-CM

## 2023-07-07 DIAGNOSIS — F840 Autistic disorder: Secondary | ICD-10-CM | POA: Diagnosis not present

## 2023-07-07 DIAGNOSIS — R5383 Other fatigue: Secondary | ICD-10-CM | POA: Diagnosis not present

## 2023-07-07 DIAGNOSIS — E291 Testicular hypofunction: Secondary | ICD-10-CM | POA: Diagnosis not present

## 2023-07-07 MED ORDER — DIAZEPAM 5 MG PO TABS: ORAL_TABLET | ORAL | 0 refills | Status: AC

## 2023-07-07 NOTE — Progress Notes (Signed)
[facility]  PATIENT: George [name] DOB: January 29, 1971  REFERRING DOCTOR OR PCP: PCP is George [name].  Patient has seen Dr. Marjory [name] SOURCE: Patient, mother, notes from Dr. Marjory [name], MRI imaging reports and lab reports.  MRI images personally reviewed  _________________________________   HISTORICAL  CHIEF COMPLAINT:  Chief Complaint  Patient presents with   Room 11    Pt is here Alone. Pt states that he works out 20-25 mins every other day. Pt states that things have been going okay. Pt states that his speech is a little off.     HISTORY OF PRESENT ILLNESS:  George [name] is a 52 y.o. man with relapsing remitting multiple sclerosis.  Update 08/01/2023: He feels mostly stable.  He has noted some slurring of speech though feels this is not a problem today.   He feels his balance and core strength are doing better.   He goes to the gym 6 days a week.     He is on glatiramer and tolerates it well.    No exacerbation   He notes gait and balance are mildly off but no worse than last visit.    He has no falls.   He uses the bannister on stairs.   Last MRI we did 03/15/2021 showed no new lesions.   He also had MRI brain and cervical spine at [facility] showing no acute changes.    He has lesions at C6 (right) and T10-T11.   T2 hyperintense lesions are seen also seen involving the right thalamus, midbrain, pons and medulla as well as bilateral middle cerebellar peduncles.   MRI of the thracic spine 02/18/2023 showed a normal spinal cord.   Since last visit, he has done some physical and speech therapy.  Swallow evaluation was ordered due to choking on food and drink.  He feels that this is actually better now than it was earlier in the year.  The swallow study was normal.  His gait is mildly unbalanced .   He stumbles but has no falls over the last few weeks.  He has trouble on stairs.  Sometimes the right foot will drag when he walks.  He dhas mild right hand numbness and  clumsiness.   Vision is fine.  Bladder function is usually good though he has some urgency with use of caffeine.  No ED.  He is concerned about a tremor.  I did not note a tremor on today's visit.  He was worried about Parkinson's disease.  He sees speech therapy for swallow function.   The states they feel swallow issues are more psychologic than due to MS.    He is active and tries to exercise daily.    He has some fatigue but no more than last year.     He is sleeping well most nights.    He has bipolar disease and is currently on Depakote.   He has mood swings and is quick to get angry.     He is on Buspar.  Wellbutrin caused constipation.   SSRI's were poorly tolerated.      He takes Vit D and B12 supplements.    MS HISTORY: He has a h/o Asperger syndrome and bipolar disease.   In 2012, he had an MRI of the brain and was found to have multiple T2/FLAIR hyperintense foci in the brain.   A lumbar puncture at the time was negative.    Over the last year, he began to have more difficulty with  right hand coordination and left foot drop.    Also the right side of his face was drooping some.     He came in to see Dr. Marjory [name] and had repeat imaging studies showing progression of the white matter foci and the appearance of a small focus at C5.    Repeat LP, however, showed negative CSF (no OCB) again.   He started glatiramer 10/2020.  We had discussed other med's as well.     OTHER MEDICAL HISTORY He has bipolar disease (sees Dr. Evelene [name]).  He also has autism.   He is on Depakote.   He notes being more emotional and easily gets overwhelmed with emotions.   This had happened when he was younger but then got better again.      He has irritable bowel syndrome and takes B12 since he was told absorption may be affected.   B12 level was fine.     Imaging studies reviewed: MRI of the brain 11/21/2010 shows multiple T2/FLAIR hyperintense foci including a focus in the left pons, middle cerebellar peduncle,  right thalamus.  Some foci of periventricular and some foci are juxtacortical.  None of the foci enhanced.  MRI of the brain 05/16/2020 showed a similar pattern at the 2012 MRI but there has been mild progression with at least 2 new lesions noted in the white matter compared to the previous MRI.  As seen before there is a large focus involving the left greater than right pons.  It may be slightly larger.  There is also involvement of the middle cerebellar peduncle which is stable on the right but a new focus on the left.  There is one stable focus in the right thalamus.  MRI of the cervical spine 08/29/2020 showed a small focus posteriorly to the right adjacent to C5.  Mild DJD at C5-C6 not causing nerve root compression.  MRI of the thoracic spine 08/29/2020 showed a normal spinal cord (subtle focus at T10-T11 more likely artifact).  There are cystic lesions within the kidneys  MRI of the brain 03/15/2021 and it was unchanged.   No new foci.  MRI of the cervical spine 03/15/2021 showed a focus to the right at [facility].  MRIs from [facility] November 2023 and compared them to the MRIs from 2022.  There did not appear to be any new activity.  MRI of the thoracic spine 02/18/2023 showed a normal spinal cord.   Laboratory results. ANA, ANCA, HIV, hep B antibodies, hep C antibody were all negative or normal.  Vitamin D was mildly low.  Vitamin B12 was fine (elevated), creatinine was mildly elevated.  JCV antibody was positive at 1.3.  There were no oligoclonal bands in the CSF.  Protein was mildly elevated at 54.  REVIEW OF SYSTEMS: Constitutional: No fevers, chills, sweats, or change in appetite Eyes: No visual changes, double vision, eye pain Ear, nose and throat: No hearing loss, ear pain, nasal congestion, sore throat Cardiovascular: No chest pain, palpitations Respiratory:  No shortness of breath at rest or with exertion.   No wheezes GastrointestinaI: No nausea, vomiting, diarrhea, abdominal pain,  fecal incontinence Genitourinary:  No dysuria, urinary retention or frequency.  No nocturia. Musculoskeletal:  No neck pain, back pain Integumentary: No rash, pruritus, skin lesions Neurological: as above Psychiatric: Notes some depression and anxiety at this time.  Diagnosed with Bipolar in past.   Endocrine: No palpitations, diaphoresis, change in appetite, change in weigh or increased thirst Hematologic/Lymphatic:  No anemia, purpura, petechiae. Allergic/Immunologic:  No itchy/runny eyes, nasal congestion, recent allergic reactions, rashes  ALLERGIES: Allergies  Allergen Reactions   Other Other (See Comments)    Can only eat at home    HOME MEDICATIONS:  Current Outpatient Medications:    amLODipine-valsartan (EXFORGE) 5-320 MG tablet, Take 1 tablet by mouth daily., Disp: , Rfl:    busPIRone (BUSPAR) 10 MG tablet, Take 20 mg by mouth 2 (two) times daily., Disp: , Rfl:    Cholecalciferol (VITAMIN D3) 50 MCG (2000 UT) TABS, Take 2,000 Units by mouth in the morning., Disp: , Rfl:    COPAXONE 40 MG/ML SOSY, Inject 40mg  subcutaneously three (3) times per week, Disp: 12 mL, Rfl: 11   cyclobenzaprine (FLEXERIL) 10 MG tablet, Take 1 tablet (10 mg total) by mouth 2 (two) times daily as needed for muscle spasms., Disp: 20 tablet, Rfl: 0   divalproex (DEPAKOTE) 250 MG DR tablet, Take 1,250 mg by mouth daily. , Disp: , Rfl:    SYNTHROID 100 MCG tablet, Take 100 mcg by mouth daily before breakfast. , Disp: , Rfl:    testosterone enanthate (DELATESTRYL) 200 MG/ML injection, Inject 150 mg into the muscle every 14 (fourteen) days. For IM use only  Every three weeks, Disp: , Rfl:    amLODipine-valsartan (EXFORGE) 5-160 MG tablet, Take 1 tablet by mouth in the morning. (Patient not taking: Reported on 02/24/2023), Disp: , Rfl:    Cyanocobalamin (VITAMIN B-12) 5000 MCG TBDP, Place 5,000 Units under the tongue 2 (two) times a week., Disp: , Rfl:    diazepam (VALIUM) 5 MG tablet, Take one or two before  MRi, Disp: 2 tablet, Rfl: 0   Multiple Vitamin (MULTIVITAMIN WITH MINERALS) TABS tablet, Take 1 tablet by mouth in the morning., Disp: , Rfl:   PAST MEDICAL HISTORY: Past Medical History:  Diagnosis Date   Anxiety    Asperger's syndrome    Autistic spectrum disorder    Asperger's (per H&P Olivia Clelland, PA)   Bipolar 1 disorder (HCC)    "no mood swings in 20 years"  on medication   GERD (gastroesophageal reflux disease)    Head injury 2020   Hypertension    Hypothyroidism    IBS (irritable bowel syndrome)     PAST SURGICAL HISTORY: Past Surgical History:  Procedure Laterality Date   NO PAST SURGERIES     RADIOLOGY WITH ANESTHESIA N/A 05/16/2020   Procedure: MRI BRAIN WITH AND WITHOUT CONTRAST;  Surgeon: Radiologist, Medication, MD;  Location: [facility] OR;  Service: Radiology;  Laterality: N/A;   RADIOLOGY WITH ANESTHESIA N/A 08/29/2020   Procedure: MRI WITH ANESTHESIA CERVICAL SPINE WITH AND WITHOUT CONTRAST, THORACIC SPINE WITH AND WITHOUT CONTRAST;  Surgeon: Radiologist, Medication, MD;  Location: [facility] OR;  Service: Radiology;  Laterality: N/A;   RADIOLOGY WITH ANESTHESIA N/A 03/15/2021   Procedure: MRI BRAIN WITH AND WITHOUT, CERIVAL WITH AND WITHOUT;  Surgeon: Radiologist, Medication, MD;  Location: [facility] OR;  Service: Radiology;  Laterality: N/A;    FAMILY HISTORY: Family History  Problem Relation Age of Onset   Hypertension Father    Depression Sister    Hypertension Paternal Grandfather     SOCIAL HISTORY:  Social History   Socioeconomic History   Marital status: Single    Spouse name: Not on file   Number of children: 0   Years of education: college   Highest education level: Not on file  Occupational History    Comment: artist  Tobacco Use   Smoking status: Never   Smokeless tobacco: Never  Vaping Use   Vaping status: Never Used  Substance and Sexual Activity   Alcohol use: No   Drug use: No   Sexual activity: Not on file  Other Topics Concern   Not on file   Social History Narrative   He lives at home.    He is single and has no children.     He denies use of tobacco, alcohol, illicit drugs and caffeine.    He works as an Tree surgeon.    Investment banker, operational: Not on [employer] Insecurity: Not on file  Transportation Needs: Not on file  Physical Activity: Not on file  Stress: Not on file  Social Connections: Unknown (12/16/2021)   Received from [facility]   Social Network    Social Network: Not on file  Intimate Partner Violence: Unknown (11/07/2021)   Received from [facility]   HITS    Physically Hurt: Not on file    Insult or Talk Down To: Not on file    Threaten Physical Harm: Not on file    Scream or Curse: Not on file     PHYSICAL EXAM  Vitals:   07/07/23 0945  BP: (!) 142/94  Pulse: 90  Weight: 156 lb 8 oz (71 kg)  Height: 5\' 7"  (1.702 m)    Body mass index is 24.51 kg/m.   General: The patient is well-developed and well-nourished and in no acute distress  HEENT:  Head is [place]/AT.  Sclera are anicteric.   Neck:  The neck is nontender.  Skin: Extremities are without rash or  edema.   Neurologic Exam  Mental status: The patient is alert and oriented x 3 at the time of the examination. The patient has apparent normal recent and remote memory, with an apparently normal attention span and concentration ability.   Speech is normal.  Cranial nerves: Extraocular movements are full.   There is mild weakness in the right lower face  Facial sensation is normal.     Trapezius and sternocleidomastoid strength is normal. No dysarthria is noted.    No obvious hearing deficits are noted.  Motor: I did not note a tremor.  Muscle bulk is normal.   Tone is mildly increased on the right. Strength is  5 / 5 in left side, 4/5 right triceps and intrinsic hand muscles, 4++ biceps and deltoid,   4+/5 right  leg except 4/5 iliopsoas and ankle/toe extensors.    Reduced RAM  in right arm and foot  Sensory: Sensory testing is intact to pinprick, soft touch and vibration sensation in arms and slightly reduced vibraiton in right leg.  Coordination: Cerebellar testing reveals mildly reduced coordination right hand and foot.   Reduced heel-to-shin.  Gait and station: Station is normal.   He has a right foot drop.  The tandem gait is wide.   Romberg is negative.   Reflexes: Deep tendon reflexes are symmetric and normal in arms and increased right leg.        DIAGNOSTIC DATA (LABS, IMAGING, TESTING) - I reviewed patient records, labs, notes, testing and imaging myself where available.  Lab Results  Component Value Date   WBC 7.1 09/11/2020   HGB 17.4 09/11/2020   HCT 52.4 (H) 09/11/2020   MCV 85 09/11/2020   PLT 206 09/11/2020      Component Value Date/Time   NA 138 03/15/2021 0934   NA 142 09/11/2020 1035   K 4.2 03/15/2021  0934   CL 104 03/15/2021 0934   CO2 26 03/15/2021 0934   GLUCOSE 87 03/15/2021 0934   BUN 14 03/15/2021 0934   BUN 16 09/11/2020 1035   CREATININE 1.22 03/15/2021 0934   CREATININE 1.14 03/04/2014 1310   CALCIUM 9.2 03/15/2021 0934   PROT 7.3 09/11/2020 1035   ALBUMIN 4.6 09/11/2020 1035   AST 16 09/11/2020 1035   ALT 20 09/11/2020 1035   ALKPHOS 67 09/11/2020 1035   BILITOT 0.3 09/11/2020 1035   GFRNONAA >60 03/15/2021 0934   GFRAA 72 09/11/2020 1035   No results found for: "CHOL", "HDL", "LDLCALC", "LDLDIRECT", "TRIG", "CHOLHDL" Lab Results  Component Value Date   HGBA1C 5.4 09/11/2020   Lab Results  Component Value Date   VITAMINB12 >2000 (H) 09/11/2020   Lab Results  Component Value Date   TSH 1.400 09/11/2020       ASSESSMENT AND PLAN  Multiple sclerosis (HCC)  Right sided weakness  Autism  Bipolar disease, chronic (HCC)  Gait disturbance   Continue glatiramer for now.  We went over natural history of MS going from a more inflammatory disease with relapses and MRI changes to a degenerative  disease with progressive impairments in the absence of new MRI activity.    We discussed the BTKi's and that a new medicine may be out in 2026.    2.   Stay active and exercie as tolerated. 3.  Continue vit D supplements 5.   Return to see [place] in 6 months or sooner for new or worsening neurologic symptoms.      42 minute office visit with the majority of the time spent face-to-face for history and physical, discussion/counseling and decision-making.  Additional time with record review and documentation.  This visit is part of a comprehensive longitudinal care medical relationship regarding the patients primary diagnosis of multiple sclerosis and related concerns.      Teddie Curd A. Epimenio Foot, MD, [facility] 07/07/2023, 10:24 AM Certified in Neurology, Clinical Neurophysiology, Sleep Medicine and Neuroimaging  [facility] [address], [address] [place], [place] [address] [phone]

## 2023-07-08 ENCOUNTER — Telehealth: Payer: Self-pay | Admitting: Neurology

## 2023-07-08 NOTE — Telephone Encounter (Signed)
[facility] adv NPR sent to [facility] for an open MRI [phone]

## 2023-07-09 DIAGNOSIS — F319 Bipolar disorder, unspecified: Secondary | ICD-10-CM | POA: Diagnosis not present

## 2023-07-09 DIAGNOSIS — R6884 Jaw pain: Secondary | ICD-10-CM | POA: Diagnosis not present

## 2023-07-16 DIAGNOSIS — F319 Bipolar disorder, unspecified: Secondary | ICD-10-CM | POA: Diagnosis not present

## 2023-07-21 DIAGNOSIS — E291 Testicular hypofunction: Secondary | ICD-10-CM | POA: Diagnosis not present

## 2023-07-23 DIAGNOSIS — F319 Bipolar disorder, unspecified: Secondary | ICD-10-CM | POA: Diagnosis not present

## 2023-07-24 DIAGNOSIS — G35 Multiple sclerosis: Secondary | ICD-10-CM | POA: Diagnosis not present

## 2023-08-04 ENCOUNTER — Telehealth: Payer: Self-pay | Admitting: *Deleted

## 2023-08-04 DIAGNOSIS — E291 Testicular hypofunction: Secondary | ICD-10-CM | POA: Diagnosis not present

## 2023-08-04 NOTE — Telephone Encounter (Signed)
I called mother who gave me pts new # [phone] I relayed below to her and then to pt.  He verbalized understanding.

## 2023-08-04 NOTE — Telephone Encounter (Signed)
-----   Message from George [name] sent at 08/04/2023  4:56 PM EST ----- Regarding: MRI Please let the patient know that I looked at the MRI that he had last week I compared it to the MRI from 2023.  The MRI looks okay.  It shows the old MS lesions but there are no new ones compared to last year.

## 2023-08-11 ENCOUNTER — Telehealth: Payer: Self-pay | Admitting: Neurology

## 2023-08-11 MED ORDER — COPAXONE 40 MG/ML ~~LOC~~ SOSY: PREFILLED_SYRINGE | SUBCUTANEOUS | 5 refills | Status: DC

## 2023-08-11 NOTE — Telephone Encounter (Signed)
 Last seen on 07/07/23 Follow up scheduled on 01/19/24 Rx sent.

## 2023-08-11 NOTE — Telephone Encounter (Signed)
 Pt request refill for COPAXONE 40 MG/ML SOSY send to [facility]. Pt will be out of medication on Wednesday.

## 2023-08-13 ENCOUNTER — Other Ambulatory Visit (HOSPITAL_COMMUNITY): Payer: Self-pay

## 2023-08-13 ENCOUNTER — Telehealth: Payer: Self-pay

## 2023-08-13 NOTE — Telephone Encounter (Signed)
 Can someone please do an Urgent PA for pt for his Copaxone 40mg /ml today 08/13/2023. Pt is going to be out of medication.

## 2023-08-13 NOTE — Telephone Encounter (Signed)
 Pt checking status of PA. Advised of previous note. Pt understood

## 2023-08-13 NOTE — Telephone Encounter (Signed)
 I called patient to discuss.  No answer, left a voicemail asking him to call us  back.  If patient calls back please advise him that his insurance has approved his Copaxone  syringes from today until the end of the year.  It looks like his refill was sent to [facility] 2 days ago.

## 2023-08-13 NOTE — Telephone Encounter (Signed)
 Sent to PA team for Urgent PA to be done 08/13/2023

## 2023-08-13 NOTE — Telephone Encounter (Signed)
 Pharmacy Patient Advocate Encounter  Received notification from [employer] that Prior Authorization for Copaxone  40MG /MLSOSY has been APPROVED from 08/13/2023 to 08/04/2024. Ran test claim, Copay is $12.15. This test claim was processed through [facility]- copay amounts may vary at other pharmacies due to pharmacy/plan contracts, or as the patient moves through the different stages of their insurance plan.   PA #/Case ID/Reference #: PA Case ID #: [id number]

## 2023-08-13 NOTE — Telephone Encounter (Signed)
 Pt called very irate wanting to speak to a nurse or manager. Pt states that he called stating that his  COPAXONE  40 MG/ML SOSY  is needing a PA and he was about to run out. Pt now is out and still does not have his medication and he was informed that the PA was still not received. Please advise.

## 2023-08-13 NOTE — Telephone Encounter (Signed)
 Urgent PA for brand Copaxone  completed and sent to [employer]. Key: [id number]. Should have determination within 1-3 business days. If patient calls back PLEASE CONFIRM WITH HIM THAT HE IS TAKING BRAND COPAXONE  AND NOT THE GENERIC (GLATIRAMER )

## 2023-08-13 NOTE — Telephone Encounter (Signed)
 Pt returning call. Advised of previous note. Pt understood

## 2023-08-13 NOTE — Telephone Encounter (Signed)
 Pharmacy Patient Advocate Encounter  Received notification from [employer] that Prior Authorization for Copaxone  40MG /ML syringes has been APPROVED from 08/13/2023 to 08/04/2024. Ran test claim, Copay is $12.15. This test claim was processed through [facility]- copay amounts may vary at other pharmacies due to pharmacy/plan contracts, or as the patient moves through the different stages of their insurance plan.   PA #/Case ID/Reference #: PA Case ID #: [id number]

## 2023-08-13 NOTE — Telephone Encounter (Signed)
 Pt called back and stated that he has an appt at 9am and will not be back till after 10:30am. Pt would like to be called after that and if he does not receive a call or gets home earlier he will call again to speak to someone.

## 2023-08-30 ENCOUNTER — Encounter: Payer: Self-pay | Admitting: Neurology

## 2023-09-05 ENCOUNTER — Encounter: Payer: Self-pay | Admitting: Neurology

## 2023-09-05 ENCOUNTER — Telehealth: Payer: Self-pay

## 2023-09-05 NOTE — Telephone Encounter (Signed)
Patient Name: George [name] MRN: [id number] DOB:1971/04/14, 53 y.o., male Today's Date: 09/05/2023  Pt called requesting a call back from PT about left foot pedal installation. Pt was called back and spoke to patient for 10'. Pt was inquiring about where to get left foot gas pedal installed. Pt was recommended [facility] in [place]. Pt was advised against buying anything from [place] due to liability concerns. Patient was educated to get it installed from licensed professional to ensure proper installation and safety of the product. Pt reported that he has already called [facility] and have spoken to them but was wondering if there were any other installers in the area. Pt was educated they are the only ones that I am aware of currently. Pt verbalized understanding.   Ileana Ladd, PT 09/05/2023, 1:56 PM

## 2023-09-08 ENCOUNTER — Telehealth: Payer: Self-pay

## 2023-09-08 ENCOUNTER — Encounter: Payer: Self-pay | Admitting: Neurology

## 2023-09-08 NOTE — Telephone Encounter (Signed)
Faxed letter to [employer] [phone]

## 2023-09-14 ENCOUNTER — Encounter: Payer: Self-pay | Admitting: Neurology

## 2023-09-25 ENCOUNTER — Encounter: Payer: Self-pay | Admitting: Neurology

## 2023-09-25 ENCOUNTER — Other Ambulatory Visit: Payer: Self-pay | Admitting: *Deleted

## 2023-09-25 DIAGNOSIS — R1310 Dysphagia, unspecified: Secondary | ICD-10-CM

## 2023-09-25 DIAGNOSIS — Z0289 Encounter for other administrative examinations: Secondary | ICD-10-CM

## 2023-09-25 DIAGNOSIS — G35 Multiple sclerosis: Secondary | ICD-10-CM

## 2023-09-25 DIAGNOSIS — R269 Unspecified abnormalities of gait and mobility: Secondary | ICD-10-CM

## 2023-09-25 DIAGNOSIS — F845 Asperger's syndrome: Secondary | ICD-10-CM

## 2023-09-25 DIAGNOSIS — R531 Weakness: Secondary | ICD-10-CM

## 2023-09-25 DIAGNOSIS — F840 Autistic disorder: Secondary | ICD-10-CM

## 2023-09-27 ENCOUNTER — Encounter: Payer: Self-pay | Admitting: Neurology

## 2023-09-28 ENCOUNTER — Encounter: Payer: Self-pay | Admitting: Neurology

## 2023-09-29 NOTE — Telephone Encounter (Signed)
 I called pt. He reports due to Autism, he made a bigger deal of changes for appointments than necessary. He does not do well with change. He will keep appts scheduled for 10/09/23 at 9:30a and 10:15am  Also reminded him about appt with Dr. Epimenio Foot 01/19/24 at 11am.

## 2023-09-29 NOTE — Telephone Encounter (Signed)
 I called pt. He wants to know if Dr. Epimenio Foot feels IBS related to his MS. He has severe IBS. Went to see gastroenterologist as a kid twice/had colonoscopy which found IBS. I did relay MS can cause bowel/bladder issues. He reports he is on specific diet to help IBS and cutting out caffeine. Aware I will send to MD for review.

## 2023-10-02 ENCOUNTER — Telehealth: Payer: Self-pay | Admitting: *Deleted

## 2023-10-02 NOTE — Telephone Encounter (Signed)
 I faxed dmv form on 10/02/2023

## 2023-10-04 ENCOUNTER — Encounter: Payer: Self-pay | Admitting: Neurology

## 2023-10-06 ENCOUNTER — Encounter: Payer: Self-pay | Admitting: *Deleted

## 2023-10-08 DIAGNOSIS — M25511 Pain in right shoulder: Secondary | ICD-10-CM | POA: Diagnosis not present

## 2023-10-09 ENCOUNTER — Ambulatory Visit: Payer: PPO

## 2023-10-09 ENCOUNTER — Ambulatory Visit: Payer: PPO | Attending: Neurology | Admitting: Occupational Therapy

## 2023-10-09 ENCOUNTER — Other Ambulatory Visit: Payer: Self-pay

## 2023-10-09 ENCOUNTER — Encounter: Payer: Self-pay | Admitting: Occupational Therapy

## 2023-10-09 DIAGNOSIS — F845 Asperger's syndrome: Secondary | ICD-10-CM | POA: Insufficient documentation

## 2023-10-09 DIAGNOSIS — F840 Autistic disorder: Secondary | ICD-10-CM | POA: Insufficient documentation

## 2023-10-09 DIAGNOSIS — R2681 Unsteadiness on feet: Secondary | ICD-10-CM | POA: Insufficient documentation

## 2023-10-09 DIAGNOSIS — R208 Other disturbances of skin sensation: Secondary | ICD-10-CM

## 2023-10-09 DIAGNOSIS — R531 Weakness: Secondary | ICD-10-CM | POA: Diagnosis not present

## 2023-10-09 DIAGNOSIS — R29818 Other symptoms and signs involving the nervous system: Secondary | ICD-10-CM

## 2023-10-09 DIAGNOSIS — R2689 Other abnormalities of gait and mobility: Secondary | ICD-10-CM

## 2023-10-09 DIAGNOSIS — M6281 Muscle weakness (generalized): Secondary | ICD-10-CM

## 2023-10-09 DIAGNOSIS — G35 Multiple sclerosis: Secondary | ICD-10-CM | POA: Diagnosis not present

## 2023-10-09 DIAGNOSIS — R278 Other lack of coordination: Secondary | ICD-10-CM

## 2023-10-09 DIAGNOSIS — R269 Unspecified abnormalities of gait and mobility: Secondary | ICD-10-CM | POA: Diagnosis not present

## 2023-10-09 DIAGNOSIS — R1310 Dysphagia, unspecified: Secondary | ICD-10-CM | POA: Insufficient documentation

## 2023-10-09 DIAGNOSIS — R41844 Frontal lobe and executive function deficit: Secondary | ICD-10-CM

## 2023-10-09 DIAGNOSIS — G35D Multiple sclerosis, unspecified: Secondary | ICD-10-CM

## 2023-10-09 NOTE — Therapy (Addendum)
 OUTPATIENT PHYSICAL THERAPY NEURO EVALUATION   Patient Name: GEARALD STONEBRAKER MRN: [id number] DOB:07-Jun-1971, 53 y.o., male Today's Date: 10/09/2023   PCP: Dr. Eleanora Neighbor REFERRING PROVIDER: Asa Lente, MD   END OF SESSION:  PT End of Session - 10/09/23 1100     Visit Number 1    Number of Visits 9    Date for PT Re-Evaluation 11/06/23    Authorization Type [employer]    PT Start Time 1100    PT Stop Time 1145    PT Time Calculation (min) 45 min             Past Medical History:  Diagnosis Date   Anxiety    Asperger's syndrome    Autistic spectrum disorder    Asperger's (per H&P Olivia Clelland, PA)   Bipolar 1 disorder (HCC)    "no mood swings in 20 years"  on medication   GERD (gastroesophageal reflux disease)    Head injury 2020   Hypertension    Hypothyroidism    IBS (irritable bowel syndrome)    Past Surgical History:  Procedure Laterality Date   NO PAST SURGERIES     RADIOLOGY WITH ANESTHESIA N/A 05/16/2020   Procedure: MRI BRAIN WITH AND WITHOUT CONTRAST;  Surgeon: Radiologist, Medication, MD;  Location: [facility] OR;  Service: Radiology;  Laterality: N/A;   RADIOLOGY WITH ANESTHESIA N/A 08/29/2020   Procedure: MRI WITH ANESTHESIA CERVICAL SPINE WITH AND WITHOUT CONTRAST, THORACIC SPINE WITH AND WITHOUT CONTRAST;  Surgeon: Radiologist, Medication, MD;  Location: [facility] OR;  Service: Radiology;  Laterality: N/A;   RADIOLOGY WITH ANESTHESIA N/A 03/15/2021   Procedure: MRI BRAIN WITH AND WITHOUT, CERIVAL WITH AND WITHOUT;  Surgeon: Radiologist, Medication, MD;  Location: [facility] OR;  Service: Radiology;  Laterality: N/A;   Patient Active Problem List   Diagnosis Date Noted   Depression 01/01/2023   Bipolar disease, chronic (HCC) 06/20/2021   Slurred speech 02/26/2021   Autism 12/20/2020   Multiple sclerosis (HCC) 10/11/2020   MRI of brain abnormal 10/11/2020   Right sided weakness 10/11/2020   Gait disturbance 10/11/2020   Asperger syndrome  03/19/2013    ONSET DATE: 09/25/23 date of referral  REFERRING DIAG:  G35 (ICD-10-CM) - Multiple sclerosis (HCC)  R53.1 (ICD-10-CM) - Right sided weakness  R26.9 (ICD-10-CM) - Gait disturbance  R53.1 (ICD-10-CM) - Weakness  F84.0 (ICD-10-CM) - Autism  R13.10 (ICD-10-CM) - Dysphagia, unspecified type  F84.5 (ICD-10-CM) - Asperger syndrome    THERAPY DIAG:  Muscle weakness (generalized)  Unsteadiness on feet  Other abnormalities of gait and mobility  Rationale for Evaluation and Treatment: Rehabilitation  SUBJECTIVE:  SUBJECTIVE STATEMENT: Pt reported gradual difficulty with gait and coordination and right foot drop. He was diagnosed with MS in last 3 years. Pt has h/o of Asperger syndrome and bipolar disease. Pt is having issue with gas pedal with R leg and working with [employer] to install left foot gas pedal. He had practice with [facility] to practice with Left foot pedaling. Pt goes to [employer] 7 days a week and working with lower weight. He spends about 30 minutes in the gym. Pt started physical therapy at [facility] physician for his R shoulder on Tuesdays or Wednesday. Pt denies pain. Pt had near fall due to recent onset of mild vertigo. Pt reports when he goes to big box stores, he uses electrical cart.  Pt accompanied by: self  PERTINENT HISTORY: Autism, MS, Bipolar 1, HTN, head injury 2020   PAIN:  Are you having pain? No  PRECAUTIONS: None  RED FLAGS: None   WEIGHT BEARING RESTRICTIONS: No  FALLS: Has patient fallen in last 6 months? No  LIVING ENVIRONMENT: Lives with: lives alone Lives in: House/apartment Stairs: No, has couple of steps in the front but uses ramp mostly Has following equipment at home: Ramped entry  PLOF: Independent  PATIENT GOALS: Strength of R  LE; going up and down 3 steps, walking better without compensating  OBJECTIVE:  Note: Objective measures were completed at Evaluation unless otherwise noted.  DIAGNOSTIC FINDINGS: no recent imaging  COGNITION: Overall cognitive status: Within functional limits for tasks assessed   SENSATION: Light touch: Impaired R foot   LOWER EXTREMITY ROM:     Active  Right Eval Left Eval  Hip flexion    Hip extension    Hip abduction    Hip adduction    Hip internal rotation    Hip external rotation    Knee flexion    Knee extension    Ankle dorsiflexion 5 20  Ankle plantarflexion    Ankle inversion    Ankle eversion     (Blank rows = not tested)  LOWER EXTREMITY MMT:    MMT Right Eval Left Eval  Hip flexion 5 5  Hip extension    Hip abduction    Hip adduction    Hip internal rotation    Hip external rotation    Knee flexion 3+ 5  Knee extension 5 5  Ankle dorsiflexion 3+ 5  Ankle plantarflexion 3+ 5  Ankle inversion    Ankle eversion    (Blank rows = not tested)  GAIT: Gait pattern: decreased arm swing- Right, decreased step length- Right, decreased stance time- Right, decreased stride length, decreased hip/knee flexion- Right, decreased ankle dorsiflexion- Right, Right foot flat, decreased trunk rotation, and poor foot clearance- Right Distance walked: 17' during the session Assistive device utilized: None Level of assistance: Complete Independence    PATIENT SURVEYS:  Patient Specific Functional Scale:  Activity: Walking = 5; going up/down stairs (3-4 steps) = 3; getting up/down from the floor = 4. Average 4.  TREATMENT DATE:  TherEx: Seated ankle DF and PF: 2 x 10 Seated knee extensions: 2 x 10 Seated marching: 10x Pt educated on energy conservation with above exercises and doing them only once a day and he can spread them out as he  likes. We discussed his workout routine at the gym and educated patient to continue to progress his resistance training as tolerated by him without causing excessive fatigue and pain.     PATIENT EDUCATION: Education details: see above Person educated: Patient Education method: Explanation Education comprehension: verbalized understanding  HOME EXERCISE PROGRAM: Access Code: [email or web address] URL: [email or web address] Date: 10/09/2023 Prepared by: Lavone Nian  Exercises - Seated Toe Raise  - 1 x daily - 7 x weekly - 2 sets - 10 reps - Seated Heel Raise  - 1 x daily - 7 x weekly - 2 sets - 10 reps - Seated Long Arc Quad  - 1 x daily - 7 x weekly - 2 sets - 10 reps - Seated March  - 1 x daily - 7 x weekly - 2 sets - 10 reps   GOALS: Goals reviewed with patient? Yes  SHORT TERM GOALS: Target date: 11/06/2023    Patient will demo understanding of HEP and will report 100% compliance with HEP for self management. Baseline: Initiated 10/09/23 Goal status: INITIAL   LONG TERM GOALS: Target date: 12/04/2023  Patient will demo 2 points improvement on PSFS to improve overall function.  Baseline: PSFS = 4 (10/09/23) Goal status: INITIAL   ASSESSMENT:  CLINICAL IMPRESSION: Patient is a 53 y.o. male who was seen today for physical therapy evaluation and treatment for gait abnormality and muscle weakness as a result of Multiple Sclerosis. Patient demonstrates gait impairments as described above, decreased gross muscle strength in R LE, decreased coordination with R LE evident with gross muscle movement during MMT testing and gait evaluation. These impairments are impeding function with walking, floor transfers and stairs (patient's primary goals of therapy). Patient will benefit from skilled PT to address above impairments and improve overall function.    OBJECTIVE IMPAIRMENTS: Abnormal gait, decreased activity tolerance, decreased balance, decreased coordination, decreased  endurance, difficulty walking, decreased ROM, decreased strength, impaired flexibility, impaired tone, impaired UE functional use, postural dysfunction, and pain.   ACTIVITY LIMITATIONS: carrying, lifting, standing, squatting, stairs, and transfers  PARTICIPATION LIMITATIONS: meal prep, cleaning, laundry, driving, shopping, and community activity  PERSONAL FACTORS: Time since onset of injury/illness/exacerbation are also affecting patient's functional outcome.   REHAB POTENTIAL: Good  CLINICAL DECISION MAKING: Stable/uncomplicated  EVALUATION COMPLEXITY: Low  PLAN:  PT FREQUENCY: 1x/week  PT DURATION: 8 weeks  PLANNED INTERVENTIONS: 97164- PT Re-evaluation, 97110-Therapeutic exercises, 97530- Therapeutic activity, 97112- Neuromuscular re-education, 97535- Self Care, 34742- Manual therapy, [phone]- Gait training, [phone]- Orthotic Fit/training, Patient/Family education, Balance training, Stair training, Cognitive remediation, and Moist heat  PLAN FOR NEXT SESSION: Focus on following: Strength (dorsiflexors, hamstrings, hip flexors), gait without excessive R UE compensation, stairs, floor transfers.   Ileana Ladd, PT 10/09/2023, 12:12 PM

## 2023-10-09 NOTE — Therapy (Signed)
 OUTPATIENT OCCUPATIONAL THERAPY NEURO EVALUATION  Patient Name: George [name] MRN: [id number] DOB:1971/03/31, 53 y.o., male Today's Date: 10/09/2023  PCP: Emilio Aspen, MD REFERRING PROVIDER: Asa Lente, MD  END OF SESSION:  OT End of Session - 10/09/23 1010     Visit Number 1    Number of Visits 9    Date for OT Re-Evaluation 12/05/23    Authorization Type [employer]    Authorization Time Period VL: [place] Auth Not Reqd    Progress Note Due on Visit 9    OT Start Time 1020    OT Stop Time 1105    OT Time Calculation (min) 45 min    Equipment Utilized During Treatment Testing Material    Activity Tolerance Patient tolerated treatment well    Behavior During Therapy Restless;Impulsive             Past Medical History:  Diagnosis Date   Anxiety    Asperger's syndrome    Autistic spectrum disorder    Asperger's (per H&P Olivia Clelland, PA)   Bipolar 1 disorder (HCC)    "no mood swings in 20 years"  on medication   GERD (gastroesophageal reflux disease)    Head injury 2020   Hypertension    Hypothyroidism    IBS (irritable bowel syndrome)    Past Surgical History:  Procedure Laterality Date   NO PAST SURGERIES     RADIOLOGY WITH ANESTHESIA N/A 05/16/2020   Procedure: MRI BRAIN WITH AND WITHOUT CONTRAST;  Surgeon: Radiologist, Medication, MD;  Location: [facility] OR;  Service: Radiology;  Laterality: N/A;   RADIOLOGY WITH ANESTHESIA N/A 08/29/2020   Procedure: MRI WITH ANESTHESIA CERVICAL SPINE WITH AND WITHOUT CONTRAST, THORACIC SPINE WITH AND WITHOUT CONTRAST;  Surgeon: Radiologist, Medication, MD;  Location: [facility] OR;  Service: Radiology;  Laterality: N/A;   RADIOLOGY WITH ANESTHESIA N/A 03/15/2021   Procedure: MRI BRAIN WITH AND WITHOUT, CERIVAL WITH AND WITHOUT;  Surgeon: Radiologist, Medication, MD;  Location: [facility] OR;  Service: Radiology;  Laterality: N/A;   Patient Active Problem List   Diagnosis Date Noted   Depression 01/01/2023   Bipolar  disease, chronic (HCC) 06/20/2021   Slurred speech 02/26/2021   Autism 12/20/2020   Multiple sclerosis (HCC) 10/11/2020   MRI of brain abnormal 10/11/2020   Right sided weakness 10/11/2020   Gait disturbance 10/11/2020   Asperger syndrome 03/19/2013    ONSET DATE: 09/25/2023  REFERRING DIAG: G35 (ICD-10-CM) - Multiple sclerosis (HCC) R53.1 (ICD-10-CM) - Right sided weakness R26.9 (ICD-10-CM) - Gait disturbance R53.1 (ICD-10-CM) - Weakness F84.0 (ICD-10-CM) - Autism R13.10 (ICD-10-CM) - Dysphagia, unspecified type F84.5 (ICD-10-CM) - Asperger syndrome  THERAPY DIAG:  Muscle weakness (generalized)  Other lack of coordination  Other symptoms and signs involving the nervous system  MS (multiple sclerosis) (HCC)  Other disturbances of skin sensation  Frontal lobe and executive function deficit  Rationale for Evaluation and Treatment: Rehabilitation  SUBJECTIVE:   SUBJECTIVE STATEMENT:  Upon questioning pt today about several things within eval, pt reached out to both parents on different issues and had to take a call from [facility] re: the vehicle modifications he is trying to get.  His mother did call back and indicate that he had medical  POA paperwork ~ 53 years old though.  Pt has been having an issue with managing the gas/brake pedals with his R foot paid for training with  [facility] to practice with Left foot pedaling.  [place] called to take a deposit for  ordering modified foot pedal for his vehicle.  T  Pt reported to PT/OT that he goes to [employer] 7 days a week and works with lower weight. He spends about 30 minutes in the gym. Pt started physical therapy at [facility] physician for his R shoulder with sessions on Tuesdays or Wednesday. Pt did report a near fall at night due to recent onset of mild vertigo. Pt reports when he goes to big box stores, he uses electrical cart. He also reports having IBS and that eating anything out of the ordinary makes him nauseated to  the point that moving his head can then cause vertigo.  Pt told OT, "My hand is becoming weaker."  He started working on his hand exercises again but hadn't been doing them in awhile because it was frustrating.  Pt accompanied by: self  PERTINENT HISTORY: Autism/Aspergers, MS, Bipolar 1, depression, HTN, IBS, head injury 2020   PRECAUTIONS: Fall   WEIGHT BEARING RESTRICTIONS: No  PAIN:  Are you having pain? No  FALLS: Has patient fallen in last 6 months? No - almost fell at night due to veritigo but caught himself  LIVING ENVIRONMENT: Lives with: lives alone Lives in: House/apartment Stairs: Yes: External: 2 steps; on right going up (which is his bad hand) but he also has a ramp he can use with 2 rails Has following equipment at home: Grab bars and Ramped entry  PLOF:  Independent with basic ADLs, Independent with gait, and still driving.  Previously reported considering paying to have groceries delivered   PATIENT GOALS: When asked about his goals, pt referred to his weaker R hand   OBJECTIVE:  Note: Objective measures were completed at Evaluation unless otherwise noted.  HAND DOMINANCE: Left  ADLs: Overall ADLs: Remains Ind but hates taking off his coat Transfers/ambulation related to ADLs: Eating: Ind but doesn't cut food Grooming: Ind UB Dressing: Pt has stopped buying shirts with buttons LB Dressing: He only wears cotton pants with no fasteners Toileting: No trouble Bathing: Tries to shower at least once every other day Tub Shower transfers: Mod Ind walk in shower, no seat, has bars Equipment: Grab bars  IADLs:  Shopping: Drives a [employer] - awaiting L foot control installed but goes shopping often  Light housekeeping: Is getting by ie) "trying not to get help" but finds it frustrating to do laundry.  He has a folding device for clothing but spreading them out it is a 'pain,' clothing organization is messy even though he has shelves for his clothes.  Pt reports he  hates making bed.  He uses regular bed sheets - not fitted due to difficulty with putting them on the bed.  Meal Prep: Is getting by but with no real use of RUE Community mobility: No AE at this time Medication management: Ind Landscape architect: Ind Handwriting: NT  MOBILITY STATUS: Independent  POSTURE COMMENTS:  rounded shoulders and forward head Sitting balance: WFL  ACTIVITY TOLERANCE: Activity tolerance: WFL  FUNCTIONAL OUTCOME MEASURES: Quick Dash: 54.5  UPPER EXTREMITY ROM:    Active ROM Right eval Left eval  Shoulder flexion Limited from full range and requires extreme effort to lift overhead   Shoulder abduction Limitations from full ROM in all RUE joints   Shoulder adduction    Shoulder extension    Shoulder internal rotation    Shoulder external rotation    Elbow flexion    Elbow extension    Wrist flexion    Wrist extension  Wrist ulnar deviation    Wrist radial deviation    Wrist pronation    Wrist supination    (Blank rows = not tested)  UPPER EXTREMITY MMT:     MMT Right eval Left eval  Shoulder flexion 3-   Shoulder abduction    Shoulder adduction    Shoulder extension    Shoulder internal rotation    Shoulder external rotation    Middle trapezius    Lower trapezius    Elbow flexion 4-   Elbow extension    Wrist flexion    Wrist extension 3-   Wrist ulnar deviation    Wrist radial deviation    Wrist pronation    Wrist supination 3-   (Blank rows = not tested)  HAND FUNCTION: Grip strength: Right: 38.5, 38.5, 40.7  lbs; Left: 82.0, 95.6, 86.1 lbs Average: Right 39.3 lbs Left 89.7 lbs  COORDINATION: Box and Blocks:  Right 52 blocks, Left 16 blocks  SENSATION: TBA  - previous report - Light touch: Impaired Some tingling in palmar surface   EDEMA: Slight RUE due to dependent position  MUSCLE TONE: RUE: Moderate  COGNITION: Overall cognitive status:  Pt WFL but limited with some complications due to autism ie) difficulty  with problem solving and frustration management ~ although he does have some   VISION: Subjective report: He does reports some difficulty with L eye  Baseline vision:  WFL Visual history: NA   VISION ASSESSMENT: Not tested  Patient previously reported he has difficulty with some vision on L side by self report.   OBSERVATIONS: Pt ambulates with no AE and no loss of balance but with obvious limitations in R LE control. The pt appears well kept and has shaved his head with easy to don clothing.  Pt is visibly irritated with difficulty removing his jacket but is able to recognize his frustration.                                                                                                                            TREATMENT DATE: NA   PATIENT EDUCATION: Education details: OT role and POC consideration Person educated: Patient Education method: Explanation Education comprehension: verbalized understanding and needs further education  HOME EXERCISE PROGRAM: TBD  GOALS: Goals reviewed with patient? Yes  SHORT TERM GOALS: Target date: 11/07/23  Patient will demonstrate initial R UE HEP with 25% verbal cues or less for proper execution.  Baseline: Returning to outpt OT s/p 6 months Goal status: INITIAL  2.  Pt will verbalize understanding of adaptive strategies to increase ease with ADLS/IADLS   Baseline: Highly frustrated with removal of jacket Goal status: INITIAL  3.  Patient will independently verbalize at least 3 energy conservation principles in relation to ADLs to increase functional independence.  Baseline: Returning to outpt OT s/p 6 months Goal status: INITIAL  LONG TERM GOALS: Target date: 12/05/23  Patient will demonstrate updated R UE HEP with visual handouts only  for proper execution. Baseline: Returning to outpt OT s/p 6 months Goal status: INITIAL  2.  Pt will verbalize understanding of adapted strategies and/or equipment PRN to increase ease with  IADLs (I.e.  laundry, meal prep)  Baseline: Returning to outpt OT s/p 6 months Goal status: INITIAL  3.  Pt will be able to place at least 20 blocks using right hand with completion of Box and Blocks test. Baseline: 20 blocks Goal status: INITIAL  4.  Patient will demonstrate at least 40+ lbs RUE grip strength x 3 reps as needed to open jars and other containers. Baseline: Right: 38.5, 38.5, 40.7  lbs Average: 39.3 lbs Goal status: INITIAL  ASSESSMENT:  CLINICAL IMPRESSION: Patient is a 53 y.o. male who was seen today for occupational therapy evaluation for multiple sclerosis. Hx includes MS with right sided weakness, Aspergers/Autism, bipolar, depression. Patient currently presents with decreased R UE use ie) not using it for anything at this time.  Pt has decline in baseline level of function demonstrating functional deficits and impairments as noted below. Pt would benefit from skilled OT services in the outpatient setting to work on impairments as noted below to help pt return to PLOF as able.    PERFORMANCE DEFICITS: in functional skills including ADLs, IADLs, coordination, dexterity, proprioception, sensation, edema, tone, ROM, strength, flexibility, Fine motor control, Gross motor control, balance, body mechanics, endurance, decreased knowledge of precautions, decreased knowledge of use of DME, skin integrity, and UE functional use, cognitive skills including attention, emotional, energy/drive, learn, memory, perception, problem solving, safety awareness, and temperament/personality, and psychosocial skills including coping strategies, environmental adaptation, habits, interpersonal interactions, and routines and behaviors.   IMPAIRMENTS: are limiting patient from ADLs, IADLs, rest and sleep, work, leisure, and social participation.   CO-MORBIDITIES: has co-morbidities such as bipolar and autism  that affects occupational performance. Patient will benefit from skilled OT to address above impairments  and improve overall function.  MODIFICATION OR ASSISTANCE TO COMPLETE EVALUATION: Min-Moderate modification of tasks or assist with assess necessary to complete an evaluation.  OT OCCUPATIONAL PROFILE AND HISTORY: Detailed assessment: Review of records and additional review of physical, cognitive, psychosocial history related to current functional performance.  CLINICAL DECISION MAKING: Moderate - several treatment options, min-mod task modification necessary  REHAB POTENTIAL: Good  EVALUATION COMPLEXITY: Moderate  PLAN:  OT FREQUENCY: 1x/week  OT DURATION: 8 weeks + evaluation  PLANNED INTERVENTIONS: 97535 self care/ADL training, 16109 therapeutic exercise, 97530 therapeutic activity, 97112 neuromuscular re-education, [phone] aquatic therapy, [phone] Orthotics management and training, 91478 Subsequent splinting/medication, balance training, functional mobility training, psychosocial skills training, energy conservation, coping strategies training, patient/family education, and DME and/or AE instructions  RECOMMENDED OTHER SERVICES: Pt received P.T. neuro evaluation today and ortho eval yesterday and is awaiting modifications to his vehicle for L foot control.  Pt may benefit from getting connected with [facility] [place] for safer transportation with his vehicle is unavailable and/or further decline in functional abilities occurs.  CONSULTED AND AGREED WITH PLAN OF CARE: Patient  PLAN FOR NEXT SESSION:  HEP for RUE AE for ADLs/IADLS Energy Conservation Monitor for splint needs    Victorino Sparrow, OT 10/09/2023, 3:58 PM

## 2023-10-14 ENCOUNTER — Encounter: Payer: Self-pay | Admitting: Neurology

## 2023-10-14 DIAGNOSIS — M25511 Pain in right shoulder: Secondary | ICD-10-CM | POA: Diagnosis not present

## 2023-10-14 DIAGNOSIS — E291 Testicular hypofunction: Secondary | ICD-10-CM | POA: Diagnosis not present

## 2023-10-16 ENCOUNTER — Ambulatory Visit: Admitting: Occupational Therapy

## 2023-10-16 ENCOUNTER — Ambulatory Visit

## 2023-10-16 DIAGNOSIS — M6281 Muscle weakness (generalized): Secondary | ICD-10-CM

## 2023-10-16 DIAGNOSIS — G35 Multiple sclerosis: Secondary | ICD-10-CM

## 2023-10-16 DIAGNOSIS — R2681 Unsteadiness on feet: Secondary | ICD-10-CM

## 2023-10-16 DIAGNOSIS — R29818 Other symptoms and signs involving the nervous system: Secondary | ICD-10-CM

## 2023-10-16 DIAGNOSIS — R278 Other lack of coordination: Secondary | ICD-10-CM

## 2023-10-16 DIAGNOSIS — R2689 Other abnormalities of gait and mobility: Secondary | ICD-10-CM

## 2023-10-16 NOTE — Therapy (Signed)
 OUTPATIENT PHYSICAL THERAPY NEURO TREATMENT NOTE  Patient Name: George [name] MRN: [id number] DOB:[birth year], 53 y.o., male Today's Date: 10/16/2023   PCP: Dr. Eleanora Neighbor REFERRING PROVIDER: Asa Lente, MD   END OF SESSION:  PT End of Session - 10/16/23 0955     Visit Number 2    Number of Visits 9    Date for PT Re-Evaluation 11/06/23    Authorization Type [employer]    PT Start Time 1015    PT Stop Time 1055    PT Time Calculation (min) 40 min    Equipment Utilized During Treatment Gait belt    Activity Tolerance Patient tolerated treatment well    Behavior During Therapy WFL for tasks assessed/performed             Past Medical History:  Diagnosis Date   Anxiety    Asperger's syndrome    Autistic spectrum disorder    Asperger's (per H&P Olivia Clelland, PA)   Bipolar 1 disorder (HCC)    "no mood swings in 20 years"  on medication   GERD (gastroesophageal reflux disease)    Head injury 2020   Hypertension    Hypothyroidism    IBS (irritable bowel syndrome)    Past Surgical History:  Procedure Laterality Date   NO PAST SURGERIES     RADIOLOGY WITH ANESTHESIA N/A 05/16/2020   Procedure: MRI BRAIN WITH AND WITHOUT CONTRAST;  Surgeon: Radiologist, Medication, MD;  Location: [facility] OR;  Service: Radiology;  Laterality: N/A;   RADIOLOGY WITH ANESTHESIA N/A 08/29/2020   Procedure: MRI WITH ANESTHESIA CERVICAL SPINE WITH AND WITHOUT CONTRAST, THORACIC SPINE WITH AND WITHOUT CONTRAST;  Surgeon: Radiologist, Medication, MD;  Location: [facility] OR;  Service: Radiology;  Laterality: N/A;   RADIOLOGY WITH ANESTHESIA N/A 03/15/2021   Procedure: MRI BRAIN WITH AND WITHOUT, CERIVAL WITH AND WITHOUT;  Surgeon: Radiologist, Medication, MD;  Location: [facility] OR;  Service: Radiology;  Laterality: N/A;   Patient Active Problem List   Diagnosis Date Noted   Depression 01/01/2023   Bipolar disease, chronic (HCC) 06/20/2021   Slurred speech 02/26/2021   Autism  12/20/2020   Multiple sclerosis (HCC) 10/11/2020   MRI of brain abnormal 10/11/2020   Right sided weakness 10/11/2020   Gait disturbance 10/11/2020   Asperger syndrome 03/19/2013    ONSET DATE: 09/25/23 date of referral  REFERRING DIAG:  G35 (ICD-10-CM) - Multiple sclerosis (HCC)  R53.1 (ICD-10-CM) - Right sided weakness  R26.9 (ICD-10-CM) - Gait disturbance  R53.1 (ICD-10-CM) - Weakness  F84.0 (ICD-10-CM) - Autism  R13.10 (ICD-10-CM) - Dysphagia, unspecified type  F84.5 (ICD-10-CM) - Asperger syndrome    THERAPY DIAG:  Muscle weakness (generalized)  Unsteadiness on feet  Other abnormalities of gait and mobility  Rationale for Evaluation and Treatment: Rehabilitation  SUBJECTIVE:  SUBJECTIVE STATEMENT: I have been trying to ramp up my exercise. I went to gym for 15 min then I went home and went back to gym for 15 more min with total of 30 min but I was so tired.    Pt accompanied by: self  PERTINENT HISTORY: Autism, MS, Bipolar 1, HTN, head injury 2020   PAIN:  Are you having pain? No  PRECAUTIONS: None  RED FLAGS: None   WEIGHT BEARING RESTRICTIONS: No  FALLS: Has patient fallen in last 6 months? No  LIVING ENVIRONMENT: Lives with: lives alone Lives in: House/apartment Stairs: No, has couple of steps in the front but uses ramp mostly Has following equipment at home: Ramped entry  PLOF: Independent  PATIENT GOALS: Strength of R LE; going up and down 3 steps, walking better without compensating  OBJECTIVE:  Note: Objective measures were completed at Evaluation unless otherwise noted.  DIAGNOSTIC FINDINGS: no recent imaging  COGNITION: Overall cognitive status: Within functional limits for tasks assessed   SENSATION: Light touch: Impaired R foot   LOWER EXTREMITY  ROM:     Active  Right Eval Left Eval  Hip flexion    Hip extension    Hip abduction    Hip adduction    Hip internal rotation    Hip external rotation    Knee flexion    Knee extension    Ankle dorsiflexion 5 20  Ankle plantarflexion    Ankle inversion    Ankle eversion     (Blank rows = not tested)  LOWER EXTREMITY MMT:    MMT Right Eval Left Eval  Hip flexion 5 5  Hip extension    Hip abduction    Hip adduction    Hip internal rotation    Hip external rotation    Knee flexion 3+ 5  Knee extension 5 5  Ankle dorsiflexion 3+ 5  Ankle plantarflexion 3+ 5  Ankle inversion    Ankle eversion    (Blank rows = not tested)  GAIT: Gait pattern: decreased arm swing- Right, decreased step length- Right, decreased stance time- Right, decreased stride length, decreased hip/knee flexion- Right, decreased ankle dorsiflexion- Right, Right foot flat, decreased trunk rotation, and poor foot clearance- Right Distance walked: 42' during the session Assistive device utilized: None Level of assistance: Complete Independence    PATIENT SURVEYS:  Patient Specific Functional Scale:  Activity: Walking = 5; going up/down stairs (3-4 steps) = 3; getting up/down from the floor = 4. Average 4.                                                                                                                              TREATMENT DATE:  TherEx: Seated ankle DF and PF: 2 x 10 Seated knee extensions: 2 x 10 Seated marching: 10x Pt educated on trying to slow down his workouts at the gym. He reports that he finishes 10 exercises in 15 min and is trying to  take breaks. Pt educated to take 1-2 min breaks between sets to prolong workouts and increase rest time for muscle recovery and manage fatigue better. Pt educated that going home and coming back to gym and going back home will exert more fatigue overall rather than staying in gym for 30 min (slowing down his workouts)  Pt educated on doing more  of his hobbies, painting, music etc. For overall enjoyment SCI: UE and LE: for 5', 0 resistance. Pt cued to keep steps/min <40 to manage fatigue.  Stair training: 2 x 4 steps using rail with L UE with step through pattern.    PATIENT EDUCATION: Education details: see above Person educated: Patient Education method: Explanation Education comprehension: verbalized understanding  HOME EXERCISE PROGRAM: Access Code: [email or web address] URL: [email or web address] Date: 10/09/2023 Prepared by: Lavone Nian  Exercises - Seated Toe Raise  - 1 x daily - 7 x weekly - 2 sets - 10 reps - Seated Heel Raise  - 1 x daily - 7 x weekly - 2 sets - 10 reps - Seated Long Arc Quad  - 1 x daily - 7 x weekly - 2 sets - 10 reps - Seated March  - 1 x daily - 7 x weekly - 2 sets - 10 reps   GOALS: Goals reviewed with patient? Yes  SHORT TERM GOALS: Target date: 11/06/2023    Patient will demo understanding of HEP and will report 100% compliance with HEP for self management. Baseline: Initiated 10/09/23 Goal status: INITIAL   LONG TERM GOALS: Target date: 12/04/2023  Patient will demo 2 points improvement on PSFS to improve overall function.  Baseline: PSFS = 4 (10/09/23) Goal status: INITIAL   ASSESSMENT:  CLINICAL IMPRESSION: Patient tolerated session well. Pt is motivated to exercise. Exercises within session were performed with adequate rest in between to avoid excessive fatigue and increase overall endurance.   OBJECTIVE IMPAIRMENTS: Abnormal gait, decreased activity tolerance, decreased balance, decreased coordination, decreased endurance, difficulty walking, decreased ROM, decreased strength, impaired flexibility, impaired tone, impaired UE functional use, postural dysfunction, and pain.   ACTIVITY LIMITATIONS: carrying, lifting, standing, squatting, stairs, and transfers  PARTICIPATION LIMITATIONS: meal prep, cleaning, laundry, driving, shopping, and community activity  PERSONAL  FACTORS: Time since onset of injury/illness/exacerbation are also affecting patient's functional outcome.   REHAB POTENTIAL: Good  CLINICAL DECISION MAKING: Stable/uncomplicated  EVALUATION COMPLEXITY: Low  PLAN:  PT FREQUENCY: 1x/week  PT DURATION: 8 weeks  PLANNED INTERVENTIONS: 97164- PT Re-evaluation, 97110-Therapeutic exercises, 97530- Therapeutic activity, 97112- Neuromuscular re-education, 97535- Self Care, 40347- Manual therapy, [phone]- Gait training, [phone]- Orthotic Fit/training, Patient/Family education, Balance training, Stair training, Cognitive remediation, and Moist heat  PLAN FOR NEXT SESSION: Focus on following: Strength (dorsiflexors, hamstrings, hip flexors), gait without excessive R UE compensation, stairs, floor transfers.   Ileana Ladd, PT 10/16/2023, 10:58 AM

## 2023-10-16 NOTE — Therapy (Signed)
 OUTPATIENT OCCUPATIONAL THERAPY NEURO TREATMENT  Patient Name: BELAL SCALLON MRN: [id number] DOB:20-Sep-1970, 53 y.o., male Today's Date: 10/16/2023  PCP: Emilio Aspen, MD REFERRING PROVIDER: Asa Lente, MD  END OF SESSION:  OT End of Session - 10/16/23 1003     Visit Number 2    Number of Visits 9    Date for OT Re-Evaluation 12/05/23    Authorization Type [employer]    Authorization Time Period VL: [place] Auth Not Reqd    Progress Note Due on Visit 9    OT Start Time 0932    OT Stop Time 1015    OT Time Calculation (min) 43 min    Equipment Utilized During Treatment Cards    Activity Tolerance Patient tolerated treatment well    Behavior During Therapy Impulsive;WFL for tasks assessed/performed              Past Medical History:  Diagnosis Date   Anxiety    Asperger's syndrome    Autistic spectrum disorder    Asperger's (per H&P Olivia Clelland, PA)   Bipolar 1 disorder (HCC)    "no mood swings in 20 years"  on medication   GERD (gastroesophageal reflux disease)    Head injury 2020   Hypertension    Hypothyroidism    IBS (irritable bowel syndrome)    Past Surgical History:  Procedure Laterality Date   NO PAST SURGERIES     RADIOLOGY WITH ANESTHESIA N/A 05/16/2020   Procedure: MRI BRAIN WITH AND WITHOUT CONTRAST;  Surgeon: Radiologist, Medication, MD;  Location: [facility];  Service: Radiology;  Laterality: N/A;   RADIOLOGY WITH ANESTHESIA N/A 08/29/2020   Procedure: MRI WITH ANESTHESIA CERVICAL SPINE WITH AND WITHOUT CONTRAST, THORACIC SPINE WITH AND WITHOUT CONTRAST;  Surgeon: Radiologist, Medication, MD;  Location: [facility];  Service: Radiology;  Laterality: N/A;   RADIOLOGY WITH ANESTHESIA N/A 03/15/2021   Procedure: MRI BRAIN WITH AND WITHOUT, CERIVAL WITH AND WITHOUT;  Surgeon: Radiologist, Medication, MD;  Location: [facility];  Service: Radiology;  Laterality: N/A;   Patient Active Problem List   Diagnosis Date Noted   Depression 01/01/2023    Bipolar disease, chronic (HCC) 06/20/2021   Slurred speech 02/26/2021   Autism 12/20/2020   Multiple sclerosis (HCC) 10/11/2020   MRI of brain abnormal 10/11/2020   Right sided weakness 10/11/2020   Gait disturbance 10/11/2020   Asperger syndrome 03/19/2013    ONSET DATE: 09/25/2023  REFERRING DIAG: G35 (ICD-10-CM) - Multiple sclerosis (HCC) R53.1 (ICD-10-CM) - Right sided weakness R26.9 (ICD-10-CM) - Gait disturbance R53.1 (ICD-10-CM) - Weakness F84.0 (ICD-10-CM) - Autism R13.10 (ICD-10-CM) - Dysphagia, unspecified type F84.5 (ICD-10-CM) - Asperger syndrome  THERAPY DIAG:  Other lack of coordination  Muscle weakness (generalized)  Other symptoms and signs involving the nervous system  MS (multiple sclerosis) (HCC)  Rationale for Evaluation and Treatment: Rehabilitation  SUBJECTIVE:   SUBJECTIVE STATEMENT:  Pt has been working with his R hand squeezing ball and opening rubber bands 7-8 times 3x/day.  He reported his sleep has been disturbed ie) went to bed too early last night ~ 7-8 pm and then was up at 1 am.  When asked about normal schedule, pt reports: Wake up 7-8 am, bed at 9 pm, wind down and sleep at 10 - may watch TV Rest x 40 minutes in the day with use of alarm. Trying to change work out schedule Try to eat throughout the day ~ 5 times  Hobbies: Paint, music, collecting movies DVD  800+, poetry, writing novel (working on 3rd one).  He reports asking someone out/talking with several male companions as she hopes this will keep him busy and improve social interactions.  Working on strength  Pt accompanied by: self  PERTINENT HISTORY: Autism/Aspergers, MS, Bipolar 1, depression, HTN, IBS, head injury 2020   PRECAUTIONS: Fall   WEIGHT BEARING RESTRICTIONS: No  PAIN:  Are you having pain? No  FALLS: Has patient fallen in last 6 months? No - almost fell at night due to veritigo but caught himself  LIVING ENVIRONMENT: Lives with: lives alone Lives in:  House/apartment Stairs: Yes: External: 2 steps; on right going up (which is his bad hand) but he also has a ramp he can use with 2 rails Has following equipment at home: Grab bars and Ramped entry  PLOF:  Independent with basic ADLs, Independent with gait, and still driving.  Previously reported considering paying to have groceries delivered   PATIENT GOALS: When asked about his goals, pt referred to his weaker R hand   OBJECTIVE:  Note: Objective measures were completed at Evaluation unless otherwise noted.  HAND DOMINANCE: Left  ADLs: Overall ADLs: Remains Ind but hates taking off his coat Transfers/ambulation related to ADLs: Eating: Ind but doesn't cut food Grooming: Ind UB Dressing: Pt has stopped buying shirts with buttons LB Dressing: He only wears cotton pants with no fasteners Toileting: No trouble Bathing: Tries to shower at least once every other day Tub Shower transfers: Mod Ind walk in shower, no seat, has bars Equipment: Grab bars  IADLs:  Shopping: Drives a [employer] - awaiting L foot control installed but goes shopping often  Light housekeeping: Is getting by ie) "trying not to get help" but finds it frustrating to do laundry.  He has a folding device for clothing but spreading them out it is a 'pain,' clothing organization is messy even though he has shelves for his clothes.  Pt reports he hates making bed.  He uses regular bed sheets - not fitted due to difficulty with putting them on the bed.  Meal Prep: Is getting by but with no real use of RUE Community mobility: No AE at this time Medication management: Ind Landscape architect: Ind Handwriting: NT  MOBILITY STATUS: Independent  POSTURE COMMENTS:  rounded shoulders and forward head Sitting balance: WFL  ACTIVITY TOLERANCE: Activity tolerance: WFL  FUNCTIONAL OUTCOME MEASURES: Quick Dash: 54.5  UPPER EXTREMITY ROM:    Active ROM Right eval Left eval  Shoulder flexion Limited from full range and  requires extreme effort to lift overhead   Shoulder abduction Limitations from full ROM in all RUE joints   Shoulder adduction    Shoulder extension    Shoulder internal rotation    Shoulder external rotation    Elbow flexion    Elbow extension    Wrist flexion    Wrist extension    Wrist ulnar deviation    Wrist radial deviation    Wrist pronation    Wrist supination    (Blank rows = not tested)  UPPER EXTREMITY MMT:     MMT Right eval Left eval  Shoulder flexion 3-   Shoulder abduction    Shoulder adduction    Shoulder extension    Shoulder internal rotation    Shoulder external rotation    Middle trapezius    Lower trapezius    Elbow flexion 4-   Elbow extension    Wrist flexion    Wrist extension 3-   Wrist ulnar  deviation    Wrist radial deviation    Wrist pronation    Wrist supination 3-   (Blank rows = not tested)  HAND FUNCTION: Grip strength: Right: 38.5, 38.5, 40.7  lbs; Left: 82.0, 95.6, 86.1 lbs Average: Right 39.3 lbs Left 89.7 lbs  COORDINATION: Box and Blocks:  Right 52 blocks, Left 16 blocks  SENSATION: TBA  - previous report - Light touch: Impaired Some tingling in palmar surface   EDEMA: Slight RUE due to dependent position  MUSCLE TONE: RUE: Moderate  COGNITION: Overall cognitive status:  Pt WFL but limited with some complications due to autism ie) difficulty with problem solving and frustration management ~ although he does have some   VISION: Subjective report: He does reports some difficulty with L eye  Baseline vision:  WFL Visual history: NA   VISION ASSESSMENT: Not tested  Patient previously reported he has difficulty with some vision on L side by self report.   OBSERVATIONS: Pt ambulates with no AE and no loss of balance but with obvious limitations in R LE control. The pt appears well kept and has shaved his head with easy to don clothing.  Pt is visibly irritated with difficulty removing his jacket but is able to recognize  his frustration.                                                                                                                            TODAY'S TREATMENT:    Self Care: Reviewed dressing ideas.  Pt reports good success with flipping jacket over head to don it s/p suggesting at eval.  Pt reported trouble with LB dressing and ideas shared with how pt can cross his leg to opposite knee, don clothing item (boxers, pants etc) and ensure his toes are out the bottom of the item before lowering his foot to the floor. Pt to practice this week.  Introduced Printmaker education as pt reported he walked back and forth for grocery 5x one day.  Pt encouraged to combine trips to minimize tiredness at the end of the day when he notices his R foot drag more.   Therapeutic Activities: OT educated pt on table top play of Golf Solitaire for BUE engagement, RUE coordination, visual processing, scanning, and sequencing. Pt required minimal cues for proper play. PT encouraged to consider cards for FM tasks to include shuffling, dealing and flipping cards 1 at a time. Setup patient to work on Copywriter, advertising using R index/long finger with thumb to flip cards from deck held in R hand.  He also pulled cards off the table 1 card at a time from the piles to remove cards 1 digit higher or lower than the flipped card.   Patient is encouraged to take breaks, relax arm/shoulder by supporting forearm, minimize compensatory motions, breath normally and try different activities throughout the day/week including games like Dorisann [employer]ames (for [employer]), card games, Connect 4 etc.   PATIENT EDUCATION: Education details: ADL  comp strategies Person educated: Patient Education method: Explanation, Demonstration, Tactile cues, and Verbal cues Education comprehension: verbalized understanding, returned demonstration, verbal cues required, tactile cues required, and needs further education  HOME EXERCISE PROGRAM: TBD  GOALS: Goals  reviewed with patient? Yes  SHORT TERM GOALS: Target date: 11/07/23  Patient will demonstrate initial R UE HEP with 25% verbal cues or less for proper execution.  Baseline: Returning to outpt OT s/p 6 months Goal status: IN Progress  2.  Pt will verbalize understanding of adaptive strategies to increase ease with ADLS/IADLS   Baseline: Highly frustrated with removal of jacket Goal status: IN Progress  3.  Patient will independently verbalize at least 3 energy conservation principles in relation to ADLs to increase functional independence.  Baseline: Returning to outpt OT s/p 6 months Goal status: IN Progress  LONG TERM GOALS: Target date: 12/05/23  Patient will demonstrate updated R UE HEP with visual handouts only for proper execution. Baseline: Returning to outpt OT s/p 6 months Goal status: INITIAL  2.  Pt will verbalize understanding of adapted strategies and/or equipment PRN to increase ease with  IADLs (I.e. laundry, meal prep)  Baseline: Returning to outpt OT s/p 6 months Goal status: INITIAL  3.  Pt will be able to place at least 20 blocks using right hand with completion of Box and Blocks test. Baseline: 20 blocks Goal status: INITIAL  4.  Patient will demonstrate at least 40+ lbs RUE grip strength x 3 reps as needed to open jars and other containers. Baseline: Right: 38.5, 38.5, 40.7  lbs Average: 39.3 lbs Goal status: IN Progress 10/16/23: 44.9, 32.4, 39 x 3 trials  ASSESSMENT:  CLINICAL IMPRESSION: Patient is a 53 y.o. male who was seen today for occupational therapy treatment for multiple sclerosis especially with right sided weakness. Patient has been working on R UE strength with encouragement to work on functional use of RUE with calm, slow movements as noted today.  Pt will benefit from continued skilled OT services in the outpatient setting to work on impairments as noted at eval to help pt return to higher level of RUE function as able.    PERFORMANCE DEFICITS:  in functional skills including ADLs, IADLs, coordination, dexterity, proprioception, sensation, edema, tone, ROM, strength, flexibility, Fine motor control, Gross motor control, balance, body mechanics, endurance, decreased knowledge of precautions, decreased knowledge of use of DME, skin integrity, and UE functional use, cognitive skills including attention, emotional, energy/drive, learn, memory, perception, problem solving, safety awareness, and temperament/personality, and psychosocial skills including coping strategies, environmental adaptation, habits, interpersonal interactions, and routines and behaviors.   IMPAIRMENTS: are limiting patient from ADLs, IADLs, rest and sleep, work, leisure, and social participation.   CO-MORBIDITIES: has co-morbidities such as bipolar and autism  that affects occupational performance. Patient will benefit from skilled OT to address above impairments and improve overall function.  REHAB POTENTIAL: Good  PLAN:  OT FREQUENCY: 1x/week  OT DURATION: 8 weeks + evaluation  PLANNED INTERVENTIONS: 97535 self care/ADL training, 52841 therapeutic exercise, 97530 therapeutic activity, 97112 neuromuscular re-education, [phone] aquatic therapy, [phone] Orthotics management and training, 53664 Subsequent splinting/medication, balance training, functional mobility training, psychosocial skills training, energy conservation, coping strategies training, patient/family education, and DME and/or AE instructions  RECOMMENDED OTHER SERVICES: Pt received P.T. neuro evaluation today and ortho eval yesterday and is awaiting modifications to his vehicle for L foot control.  Pt may benefit from getting connected with [facility] [place] for safer transportation with his vehicle is unavailable and/or further  decline in functional abilities occurs.  CONSULTED AND AGREED WITH PLAN OF CARE: Patient  PLAN FOR NEXT SESSION:  HEP for RUE - coordination ideas - try LE[employer] AE for ADLs/IADLS Energy  Conservation HANDOUTS Monitor for splint needs  Victorino Sparrow, OT 10/16/2023, 2:19 PM

## 2023-10-20 DIAGNOSIS — H8113 Benign paroxysmal vertigo, bilateral: Secondary | ICD-10-CM | POA: Diagnosis not present

## 2023-10-20 DIAGNOSIS — G35 Multiple sclerosis: Secondary | ICD-10-CM | POA: Diagnosis not present

## 2023-10-20 DIAGNOSIS — E039 Hypothyroidism, unspecified: Secondary | ICD-10-CM | POA: Diagnosis not present

## 2023-10-20 DIAGNOSIS — F845 Asperger's syndrome: Secondary | ICD-10-CM | POA: Diagnosis not present

## 2023-10-20 DIAGNOSIS — E291 Testicular hypofunction: Secondary | ICD-10-CM | POA: Diagnosis not present

## 2023-10-20 DIAGNOSIS — F319 Bipolar disorder, unspecified: Secondary | ICD-10-CM | POA: Diagnosis not present

## 2023-10-20 DIAGNOSIS — I10 Essential (primary) hypertension: Secondary | ICD-10-CM | POA: Diagnosis not present

## 2023-10-21 DIAGNOSIS — M25511 Pain in right shoulder: Secondary | ICD-10-CM | POA: Diagnosis not present

## 2023-10-23 ENCOUNTER — Ambulatory Visit: Admitting: Occupational Therapy

## 2023-10-23 ENCOUNTER — Ambulatory Visit

## 2023-10-23 DIAGNOSIS — M6281 Muscle weakness (generalized): Secondary | ICD-10-CM

## 2023-10-23 DIAGNOSIS — R278 Other lack of coordination: Secondary | ICD-10-CM

## 2023-10-23 DIAGNOSIS — R41844 Frontal lobe and executive function deficit: Secondary | ICD-10-CM

## 2023-10-23 DIAGNOSIS — R2689 Other abnormalities of gait and mobility: Secondary | ICD-10-CM

## 2023-10-23 DIAGNOSIS — R2681 Unsteadiness on feet: Secondary | ICD-10-CM

## 2023-10-23 DIAGNOSIS — G35 Multiple sclerosis: Secondary | ICD-10-CM

## 2023-10-23 DIAGNOSIS — R29818 Other symptoms and signs involving the nervous system: Secondary | ICD-10-CM

## 2023-10-23 NOTE — Therapy (Signed)
 OUTPATIENT PHYSICAL THERAPY NEURO TREATMENT NOTE  Patient Name: George [name] MRN: [id number] DOB:[birth year], 53 y.o., male Today's Date: 10/23/2023   PCP: Dr. Eleanora Neighbor REFERRING PROVIDER: Asa Lente, MD   END OF SESSION:  PT End of Session - 10/23/23 1101     Visit Number 3    Number of Visits 9    Date for PT Re-Evaluation 11/06/23    Authorization Type [employer]    PT Start Time 1100    PT Stop Time 1145    PT Time Calculation (min) 45 min    Equipment Utilized During Treatment Gait belt    Activity Tolerance Patient tolerated treatment well    Behavior During Therapy WFL for tasks assessed/performed             Past Medical History:  Diagnosis Date   Anxiety    Asperger's syndrome    Autistic spectrum disorder    Asperger's (per H&P Olivia Clelland, PA)   Bipolar 1 disorder (HCC)    "no mood swings in 20 years"  on medication   GERD (gastroesophageal reflux disease)    Head injury 2020   Hypertension    Hypothyroidism    IBS (irritable bowel syndrome)    Past Surgical History:  Procedure Laterality Date   NO PAST SURGERIES     RADIOLOGY WITH ANESTHESIA N/A 05/16/2020   Procedure: MRI BRAIN WITH AND WITHOUT CONTRAST;  Surgeon: Radiologist, Medication, MD;  Location: [facility] OR;  Service: Radiology;  Laterality: N/A;   RADIOLOGY WITH ANESTHESIA N/A 08/29/2020   Procedure: MRI WITH ANESTHESIA CERVICAL SPINE WITH AND WITHOUT CONTRAST, THORACIC SPINE WITH AND WITHOUT CONTRAST;  Surgeon: Radiologist, Medication, MD;  Location: [facility] OR;  Service: Radiology;  Laterality: N/A;   RADIOLOGY WITH ANESTHESIA N/A 03/15/2021   Procedure: MRI BRAIN WITH AND WITHOUT, CERIVAL WITH AND WITHOUT;  Surgeon: Radiologist, Medication, MD;  Location: [facility] OR;  Service: Radiology;  Laterality: N/A;   Patient Active Problem List   Diagnosis Date Noted   Depression 01/01/2023   Bipolar disease, chronic (HCC) 06/20/2021   Slurred speech 02/26/2021   Autism  12/20/2020   Multiple sclerosis (HCC) 10/11/2020   MRI of brain abnormal 10/11/2020   Right sided weakness 10/11/2020   Gait disturbance 10/11/2020   Asperger syndrome 03/19/2013    ONSET DATE: 09/25/23 date of referral  REFERRING DIAG:  G35 (ICD-10-CM) - Multiple sclerosis (HCC)  R53.1 (ICD-10-CM) - Right sided weakness  R26.9 (ICD-10-CM) - Gait disturbance  R53.1 (ICD-10-CM) - Weakness  F84.0 (ICD-10-CM) - Autism  R13.10 (ICD-10-CM) - Dysphagia, unspecified type  F84.5 (ICD-10-CM) - Asperger syndrome    THERAPY DIAG:  Muscle weakness (generalized)  MS (multiple sclerosis) (HCC)  Unsteadiness on feet  Other abnormalities of gait and mobility  Rationale for Evaluation and Treatment: Rehabilitation  SUBJECTIVE:  SUBJECTIVE STATEMENT: OT provided information from their session prior to initiating PT. Pt had questions about some of the exercises that were recommended by athletic trainers and some vidoes that he saw on youtube.   Pt accompanied by: self  PERTINENT HISTORY: Autism, MS, Bipolar 1, HTN, head injury 2020   PAIN:  Are you having pain? No  PRECAUTIONS: None  RED FLAGS: None   WEIGHT BEARING RESTRICTIONS: No  FALLS: Has patient fallen in last 6 months? No  LIVING ENVIRONMENT: Lives with: lives alone Lives in: House/apartment Stairs: No, has couple of steps in the front but uses ramp mostly Has following equipment at home: Ramped entry  PLOF: Independent  PATIENT GOALS: Strength of R LE; going up and down 3 steps, walking better without compensating  OBJECTIVE:  Note: Objective measures were completed at Evaluation unless otherwise noted.  DIAGNOSTIC FINDINGS: no recent imaging  COGNITION: Overall cognitive status: Within functional limits for tasks  assessed   SENSATION: Light touch: Impaired R foot   LOWER EXTREMITY ROM:     Active  Right Eval Left Eval  Hip flexion    Hip extension    Hip abduction    Hip adduction    Hip internal rotation    Hip external rotation    Knee flexion    Knee extension    Ankle dorsiflexion 5 20  Ankle plantarflexion    Ankle inversion    Ankle eversion     (Blank rows = not tested)  LOWER EXTREMITY MMT:    MMT Right Eval Left Eval  Hip flexion 5 5  Hip extension    Hip abduction    Hip adduction    Hip internal rotation    Hip external rotation    Knee flexion 3+ 5  Knee extension 5 5  Ankle dorsiflexion 3+ 5  Ankle plantarflexion 3+ 5  Ankle inversion    Ankle eversion    (Blank rows = not tested)  GAIT: Gait pattern: decreased arm swing- Right, decreased step length- Right, decreased stance time- Right, decreased stride length, decreased hip/knee flexion- Right, decreased ankle dorsiflexion- Right, Right foot flat, decreased trunk rotation, and poor foot clearance- Right Distance walked: 38' during the session Assistive device utilized: None Level of assistance: Complete Independence    PATIENT SURVEYS:  Patient Specific Functional Scale:  Activity: Walking = 5; going up/down stairs (3-4 steps) = 3; getting up/down from the floor = 4. Average 4.                                                                                                                              TREATMENT DATE:   Pt's mood was improved at end of the OT session and pt was willing participant in PT session. Supine passive stretching of gastroc and soleus Supine ankle DF: 2 x 10 Pt demo exercise learned from athletic trainer about gastroc stretch by the wall. Pt was given modification to keep heel  pressed down to maximize stretch. Pt was asked if he would like a print out of that exercise, pt reported he has it memorized and doesn't want a print out.  Pt asked question about stepping up and over  laterally over step box which was recommended by someone at the gym. Pt was educated that even though it is good exercise, we have to be careful doing exercise like that because it would cause excessive fatigue (considering he gets tired from sitting exercises in previous sessions) and with excessive fatigue and constantly stepping up and over can lead to missed step and fall with his foot drop when he is performing in the gym  Pt also discussed you tube video where a PT was recommending heel strike during gait is not functional. Pt was educated that youtube vidoes are general and not specific to his impairments. Pt was educated that by talking to different people and watching different vidoes, he will get different recommendations which can lead to further confusion for him. Pt was asked to follow recommendations by his current PT and OT as we are working closely to help him progress and learn how to best manage his MS.  Pt reports that he has stigma against using cane, electric carts at grocery store or wearing the AFO (because he doesn't want to weaken his muscles). Pt was educated not to think above things as "Crutch" but think about those devices that he can use to remain and maintain his independence by conserving energy, managing fatigue and avoiding exacerbations to worsen his MS related symptoms.  Gait training: 1 x 460', VC to relax the R arm and improving R arm swing, focusing on heel strike with R LE and avoiding crossing of his legs with turns  Leg press: bil 60lbs 2 x 10, emphasis on controlling "snapping" of R knee during end range extension; uni leg press: 30lbs 10x  Pt wanted to get folding chairs so he can carry and sit down when he goes to [employer] or zoo. Pt was educated that he would have to carry that chair around and he would waste his energy and cause more fatigue. Pt educated that options would be to use electric wheelchair go drive between exhibits at zoo and then he can get  up to visit exhibits closely. Or potentially get Rolator where he can sit down when he gets tired. We also discussed that he can use his AFO if he goes to zoo.  Pt expressed feelings of loneliness due to not having girlfriend. Encouragement was provided to patient to focus on himself and not worry about how others will perceive him. Pt was educated on talking about some of his feelings with mental health professional so he can work through some of those feelings.   Pt is overtly worried about his symptoms getting worst in future. Pt was redirected to focus on daily management of his symptoms to best of his abilities. Pt was provided positive encouragement that he is doing a good job with doing his exercises for 30 min and trying to stay as active as he can.   Pt was also worried about intermittent pain that he gets in his R UE and R LE. Pt expressed fear that he doesn't like that pain as he has not experienced it before and references research that he read "75% of patients with MS experience lot of pain".   We took this opportunity to educate  PATIENT EDUCATION: Education details: see above Person educated: Patient  Education method: Explanation Education comprehension: verbalized understanding  HOME EXERCISE PROGRAM: Access Code: [email or web address] URL: [email or web address] Date: 10/09/2023 Prepared by: Lavone Nian  Exercises - Seated Toe Raise  - 1 x daily - 7 x weekly - 2 sets - 10 reps - Seated Heel Raise  - 1 x daily - 7 x weekly - 2 sets - 10 reps - Seated Long Arc Quad  - 1 x daily - 7 x weekly - 2 sets - 10 reps - Seated March  - 1 x daily - 7 x weekly - 2 sets - 10 reps   GOALS: Goals reviewed with patient? Yes  SHORT TERM GOALS: Target date: 11/06/2023    Patient will demo understanding of HEP and will report 100% compliance with HEP for self management. Baseline: Initiated 10/09/23 Goal status: INITIAL   LONG TERM GOALS: Target date: 12/04/2023  Patient will demo  2 points improvement on PSFS to improve overall function.  Baseline: PSFS = 4 (10/09/23) Goal status: INITIAL   ASSESSMENT:  CLINICAL IMPRESSION: Majority of session was emphasized on patient education to improve his symptoms management with MS. Pt needed lot of positive encouragement during session to focus on his overall physical and mental health, we provided education on pain management, symptom management, activity modification, fatigue management, safety awareness and remaining independent.   OBJECTIVE IMPAIRMENTS: Abnormal gait, decreased activity tolerance, decreased balance, decreased coordination, decreased endurance, difficulty walking, decreased ROM, decreased strength, impaired flexibility, impaired tone, impaired UE functional use, postural dysfunction, and pain.   ACTIVITY LIMITATIONS: carrying, lifting, standing, squatting, stairs, and transfers  PARTICIPATION LIMITATIONS: meal prep, cleaning, laundry, driving, shopping, and community activity  PERSONAL FACTORS: Time since onset of injury/illness/exacerbation are also affecting patient's functional outcome.   REHAB POTENTIAL: Good  CLINICAL DECISION MAKING: Stable/uncomplicated  EVALUATION COMPLEXITY: Low  PLAN:  PT FREQUENCY: 1x/week  PT DURATION: 8 weeks  PLANNED INTERVENTIONS: 97164- PT Re-evaluation, 97110-Therapeutic exercises, 97530- Therapeutic activity, 97112- Neuromuscular re-education, 97535- Self Care, 47829- Manual therapy, [phone]- Gait training, [phone]- Orthotic Fit/training, Patient/Family education, Balance training, Stair training, Cognitive remediation, and Moist heat  PLAN FOR NEXT SESSION: Focus on following: Strength (dorsiflexors, hamstrings, hip flexors), gait without excessive R UE compensation, stairs, floor transfers.   Ileana Ladd, PT 10/23/2023, 12:53 PM

## 2023-10-23 NOTE — Therapy (Signed)
 OUTPATIENT OCCUPATIONAL THERAPY NEURO TREATMENT  Patient Name: TABB CROGHAN MRN: [id number] DOB:1971/01/26, 53 y.o., male Today's Date: 10/23/2023  PCP: Emilio Aspen, MD REFERRING PROVIDER: Asa Lente, MD  END OF SESSION:  OT End of Session - 10/23/23 1019     Visit Number 3    Number of Visits 9    Date for OT Re-Evaluation 12/05/23    Authorization Type [employer] 2025    Authorization Time Period VL: [place] Auth Not Reqd    Progress Note Due on Visit 9    OT Start Time 1017    OT Stop Time 1100    OT Time Calculation (min) 43 min    Activity Tolerance Other (comment)   Pt limited by depression   Behavior During Therapy Flat affect              Past Medical History:  Diagnosis Date   Anxiety    Asperger's syndrome    Autistic spectrum disorder    Asperger's (per H&P Olivia Clelland, PA)   Bipolar 1 disorder (HCC)    "no mood swings in 20 years"  on medication   GERD (gastroesophageal reflux disease)    Head injury 2020   Hypertension    Hypothyroidism    IBS (irritable bowel syndrome)    Past Surgical History:  Procedure Laterality Date   NO PAST SURGERIES     RADIOLOGY WITH ANESTHESIA N/A 05/16/2020   Procedure: MRI BRAIN WITH AND WITHOUT CONTRAST;  Surgeon: Radiologist, Medication, MD;  Location: [facility] OR;  Service: Radiology;  Laterality: N/A;   RADIOLOGY WITH ANESTHESIA N/A 08/29/2020   Procedure: MRI WITH ANESTHESIA CERVICAL SPINE WITH AND WITHOUT CONTRAST, THORACIC SPINE WITH AND WITHOUT CONTRAST;  Surgeon: Radiologist, Medication, MD;  Location: [facility] OR;  Service: Radiology;  Laterality: N/A;   RADIOLOGY WITH ANESTHESIA N/A 03/15/2021   Procedure: MRI BRAIN WITH AND WITHOUT, CERIVAL WITH AND WITHOUT;  Surgeon: Radiologist, Medication, MD;  Location: [facility] OR;  Service: Radiology;  Laterality: N/A;   Patient Active Problem List   Diagnosis Date Noted   Depression 01/01/2023   Bipolar disease, chronic (HCC) 06/20/2021   Slurred speech  02/26/2021   Autism 12/20/2020   Multiple sclerosis (HCC) 10/11/2020   MRI of brain abnormal 10/11/2020   Right sided weakness 10/11/2020   Gait disturbance 10/11/2020   Asperger syndrome 03/19/2013    ONSET DATE: 09/25/2023  REFERRING DIAG: G35 (ICD-10-CM) - Multiple sclerosis (HCC) R53.1 (ICD-10-CM) - Right sided weakness R26.9 (ICD-10-CM) - Gait disturbance R53.1 (ICD-10-CM) - Weakness F84.0 (ICD-10-CM) - Autism R13.10 (ICD-10-CM) - Dysphagia, unspecified type F84.5 (ICD-10-CM) - Asperger syndrome  THERAPY DIAG:  Muscle weakness (generalized)  Other lack of coordination  Other symptoms and signs involving the nervous system  Frontal lobe and executive function deficit  MS (multiple sclerosis) (HCC)  Rationale for Evaluation and Treatment: Rehabilitation  SUBJECTIVE:   SUBJECTIVE STATEMENT:  Pt reported that he picked up his car this morning after the new L foot control pedal was installed in his car.  When asked about what he had done this week, pt reported that he didn't really do much.   When OT pointed out his R foot drop appearing worse today, he reported it was getting worse daily, that he was having a bad day, he was feeling depressed and he wanted to die.  He stated that he didn't care about anything, everything was wrong, he was very lonely and he want to go to the mountains and find  a steep cliff to jump off but that he would be fine in an hour.  Pt expressed frustration that women don't want him ie) he was rejected by a couple of women this week.  He reports that he has difficulty talking/communicating with people and autism makes relationships hard.  At end of session, pt thanked OTR for talking with him, helping him and confirmed that he felt much better than when he arrived.  Pt accompanied by: self  PERTINENT HISTORY: Autism/Aspergers, MS, Bipolar 1, depression, HTN, IBS, head injury 2020   PRECAUTIONS: Fall   WEIGHT BEARING RESTRICTIONS: No  PAIN:   Are you having pain? No  FALLS: Has patient fallen in last 6 months? No - almost fell at night due to veritigo but caught himself  LIVING ENVIRONMENT: Lives with: lives alone Lives in: House/apartment Stairs: Yes: External: 2 steps; on right going up (which is his bad hand) but he also has a ramp he can use with 2 rails Has following equipment at home: Grab bars and Ramped entry  PLOF:  Independent with basic ADLs, Independent with gait, and still driving.  Previously reported considering paying to have groceries delivered   PATIENT GOALS: When asked about his goals, pt referred to his weaker R hand   OBJECTIVE:  Note: Objective measures were completed at Evaluation unless otherwise noted.  HAND DOMINANCE: Left  ADLs: Overall ADLs: Remains Ind but hates taking off his coat Transfers/ambulation related to ADLs: Eating: Ind but doesn't cut food Grooming: Ind UB Dressing: Pt has stopped buying shirts with buttons LB Dressing: He only wears cotton pants with no fasteners Toileting: No trouble Bathing: Tries to shower at least once every other day Tub Shower transfers: Mod Ind walk in shower, no seat, has bars Equipment: Grab bars  IADLs:  Shopping: Drives a [employer] - awaiting L foot control installed but goes shopping often  Light housekeeping: Is getting by ie) "trying not to get help" but finds it frustrating to do laundry.  He has a folding device for clothing but spreading them out it is a 'pain,' clothing organization is messy even though he has shelves for his clothes.  Pt reports he hates making bed.  He uses regular bed sheets - not fitted due to difficulty with putting them on the bed.  Meal Prep: Is getting by but with no real use of RUE Community mobility: No AE at this time Medication management: Ind Landscape architect: Ind Handwriting: NT  MOBILITY STATUS: Independent  POSTURE COMMENTS:  rounded shoulders and forward head Sitting balance: WFL  ACTIVITY  TOLERANCE: Activity tolerance: WFL  FUNCTIONAL OUTCOME MEASURES: Quick Dash: 54.5  UPPER EXTREMITY ROM:    Active ROM Right eval Left eval  Shoulder flexion Limited from full range and requires extreme effort to lift overhead   Shoulder abduction Limitations from full ROM in all RUE joints   Shoulder adduction    Shoulder extension    Shoulder internal rotation    Shoulder external rotation    Elbow flexion    Elbow extension    Wrist flexion    Wrist extension    Wrist ulnar deviation    Wrist radial deviation    Wrist pronation    Wrist supination    (Blank rows = not tested)  UPPER EXTREMITY MMT:     MMT Right eval Left eval  Shoulder flexion 3-   Shoulder abduction    Shoulder adduction    Shoulder extension    Shoulder internal rotation  Shoulder external rotation    Middle trapezius    Lower trapezius    Elbow flexion 4-   Elbow extension    Wrist flexion    Wrist extension 3-   Wrist ulnar deviation    Wrist radial deviation    Wrist pronation    Wrist supination 3-   (Blank rows = not tested)  HAND FUNCTION: Grip strength: Right: 38.5, 38.5, 40.7  lbs; Left: 82.0, 95.6, 86.1 lbs Average: Right 39.3 lbs Left 89.7 lbs  COORDINATION: Box and Blocks:  Right 52 blocks, Left 16 blocks  SENSATION: TBA  - previous report - Light touch: Impaired Some tingling in palmar surface   EDEMA: Slight RUE due to dependent position  MUSCLE TONE: RUE: Moderate  COGNITION: Overall cognitive status:  Pt WFL but limited with some complications due to autism ie) difficulty with problem solving and frustration management ~ although he does have some   VISION: Subjective report: He does reports some difficulty with L eye  Baseline vision:  WFL Visual history: NA   VISION ASSESSMENT: Not tested  Patient previously reported he has difficulty with some vision on L side by self report.   OBSERVATIONS: Pt ambulates with no AE and no loss of balance but with  obvious limitations in R LE control. The pt appears well kept and has shaved his head with easy to don clothing.  Pt is visibly irritated with difficulty removing his jacket but is able to recognize his frustration.                                                                                                                            TODAY'S TREATMENT:    Self Care:  Due to pt's mood disturbances noted upon arrival to therapy today, OTR helped with identifying some options to help improve his mood including providing him with a typed list of the following areas to focus on this week. - Counsellor: Pt had a counselor previously but he has not been seeing her for awhile and thinks it might be a good idea for him to find a male counselor to explore is concerns as they impact his physical mobility, mood. - Diet: Pt has IBS and has limited tolerance to many foods but when he is not in a good mood, he makes less wise choices with foods ie) had a croissant with eggs/bacon prior to coming to therapy today which is not a good option for him.  He is encouraged to monitor appropriate food options to help with his stomach issues as well as MS. - Decrease caffeine: Pt notes that caffeine is not good for him and that he has had more than he should.  Pt encouraged to see what caffeine free options are available for him. -Focus on self: Much of his conversation today surrounded recent rejection from male companions.  Pt is encouraged to focus on what he can control r/t to himself ie) addressing R sided weakness  and incoordination, checking on community resources and continuing to work on conserving his energy through daily schedule choices.  PATIENT EDUCATION: Education details: Self care r/t mental health etc Person educated: Patient Education method: Explanation, Verbal cues, and Handouts Education comprehension: verbalized understanding, verbal cues required, and needs further education  HOME EXERCISE  PROGRAM: TBD  GOALS: Goals reviewed with patient? Yes  SHORT TERM GOALS: Target date: 11/07/23  Patient will demonstrate initial R UE HEP with 25% verbal cues or less for proper execution.  Baseline: Returning to outpt OT s/p 6 months Goal status: IN Progress  2.  Pt will verbalize understanding of adaptive strategies to increase ease with ADLS/IADLS   Baseline: Highly frustrated with removal of jacket Goal status: IN Progress  3.  Patient will independently verbalize at least 3 energy conservation principles in relation to ADLs to increase functional independence.  Baseline: Returning to outpt OT s/p 6 months Goal status: IN Progress  LONG TERM GOALS: Target date: 12/05/23  Patient will demonstrate updated R UE HEP with visual handouts only for proper execution. Baseline: Returning to outpt OT s/p 6 months Goal status: INITIAL  2.  Pt will verbalize understanding of adapted strategies and/or equipment PRN to increase ease with  IADLs (I.e. laundry, meal prep)  Baseline: Returning to outpt OT s/p 6 months Goal status: INITIAL  3.  Pt will be able to place at least 20 blocks using right hand with completion of Box and Blocks test. Baseline: 20 blocks Goal status: INITIAL  4.  Patient will demonstrate at least 40+ lbs RUE grip strength x 3 reps as needed to open jars and other containers. Baseline: Right: 38.5, 38.5, 40.7  lbs Average: 39.3 lbs Goal status: IN Progress 10/16/23: 44.9, 32.4, 39 x 3 trials 10/23/23: 34.1, 32.6, 38.5 x 3 trials  ASSESSMENT:  CLINICAL IMPRESSION: Patient is a 53 y.o. male who was seen today for occupational therapy treatment for multiple sclerosis and autism especially with mood disturbance today. Pt appeared depressed and sad upon arrival today but upon review of his situation, options to address areas of concern and written list of tasks to focus on this week, pt was in a much better mood ie) conversation was more appropriate and pt agreeable to  suggestions to distract from his sadness and frustration. Pt then able to transition to PT will benefit from continued skilled OT services in the outpatient setting to work on impairments as noted at eval to help pt return to higher level of RUE function as able.    PERFORMANCE DEFICITS: in functional skills including ADLs, IADLs, coordination, dexterity, proprioception, sensation, edema, tone, ROM, strength, flexibility, Fine motor control, Gross motor control, balance, body mechanics, endurance, decreased knowledge of precautions, decreased knowledge of use of DME, skin integrity, and UE functional use, cognitive skills including attention, emotional, energy/drive, learn, memory, perception, problem solving, safety awareness, and temperament/personality, and psychosocial skills including coping strategies, environmental adaptation, habits, interpersonal interactions, and routines and behaviors.   IMPAIRMENTS: are limiting patient from ADLs, IADLs, rest and sleep, work, leisure, and social participation.   CO-MORBIDITIES: has co-morbidities such as bipolar and autism  that affects occupational performance. Patient will benefit from skilled OT to address above impairments and improve overall function.  REHAB POTENTIAL: Good  PLAN:  OT FREQUENCY: 1x/week  OT DURATION: 8 weeks + evaluation  PLANNED INTERVENTIONS: 97535 self care/ADL training, 40981 therapeutic exercise, 97530 therapeutic activity, 97112 neuromuscular re-education, [id number] aquatic therapy, 97760 Orthotics management and training, [id number] Subsequent splinting/medication, balance  training, functional mobility training, psychosocial skills training, energy conservation, coping strategies training, patient/family education, and DME and/or AE instructions  RECOMMENDED OTHER SERVICES: Pt received P.T. neuro evaluation today and ortho eval yesterday and is awaiting modifications to his vehicle for L foot control.  Pt may benefit from getting  connected with [facility] [place] for safer transportation with his vehicle is unavailable and/or further decline in functional abilities occurs.  CONSULTED AND AGREED WITH PLAN OF CARE: Patient  PLAN FOR NEXT SESSION:  Check on mood Provide community resources ie) https://www.autismsociety-[place].org/adult-program-info/ HEP for RUE - coordination ideas - try LEGO AE for ADLs/IADLS Energy Conservation HANDOUTS Monitor for splint needs  Victorino Sparrow, OT 10/23/2023, 4:29 PM

## 2023-10-28 DIAGNOSIS — E291 Testicular hypofunction: Secondary | ICD-10-CM | POA: Diagnosis not present

## 2023-10-28 DIAGNOSIS — M25511 Pain in right shoulder: Secondary | ICD-10-CM | POA: Diagnosis not present

## 2023-10-30 ENCOUNTER — Ambulatory Visit: Admitting: Occupational Therapy

## 2023-10-30 ENCOUNTER — Ambulatory Visit

## 2023-10-30 ENCOUNTER — Encounter: Payer: Self-pay | Admitting: Neurology

## 2023-10-30 DIAGNOSIS — M6281 Muscle weakness (generalized): Secondary | ICD-10-CM

## 2023-10-30 DIAGNOSIS — G35 Multiple sclerosis: Secondary | ICD-10-CM

## 2023-10-30 DIAGNOSIS — R2689 Other abnormalities of gait and mobility: Secondary | ICD-10-CM

## 2023-10-30 DIAGNOSIS — R29818 Other symptoms and signs involving the nervous system: Secondary | ICD-10-CM

## 2023-10-30 DIAGNOSIS — R278 Other lack of coordination: Secondary | ICD-10-CM

## 2023-10-30 DIAGNOSIS — R2681 Unsteadiness on feet: Secondary | ICD-10-CM

## 2023-10-30 NOTE — Therapy (Signed)
 OUTPATIENT OCCUPATIONAL THERAPY NEURO TREATMENT  Patient Name: George [name] MRN: [id number] DOB:07/15/1971, 53 y.o., male Today's Date: 10/30/2023  PCP: Emilio Aspen, MD REFERRING PROVIDER: Asa Lente, MD  END OF SESSION:  OT End of Session - 10/30/23 1151     Visit Number 4    Number of Visits 9    Date for OT Re-Evaluation 12/05/23    Authorization Type [employer] 2025    Authorization Time Period VL: [place] Auth Not Reqd    Progress Note Due on Visit 9    OT Start Time 1148    OT Stop Time 1230    OT Time Calculation (min) 42 min    Equipment Utilized During Treatment Legos    Activity Tolerance Patient tolerated treatment well   Pt limited by depression   Behavior During Therapy WFL for tasks assessed/performed              Past Medical History:  Diagnosis Date   Anxiety    Asperger's syndrome    Autistic spectrum disorder    Asperger's (per H&P Olivia Clelland, PA)   Bipolar 1 disorder (HCC)    "no mood swings in 20 years"  on medication   GERD (gastroesophageal reflux disease)    Head injury 2020   Hypertension    Hypothyroidism    IBS (irritable bowel syndrome)    Past Surgical History:  Procedure Laterality Date   NO PAST SURGERIES     RADIOLOGY WITH ANESTHESIA N/A 05/16/2020   Procedure: MRI BRAIN WITH AND WITHOUT CONTRAST;  Surgeon: Radiologist, Medication, MD;  Location: [facility] OR;  Service: Radiology;  Laterality: N/A;   RADIOLOGY WITH ANESTHESIA N/A 08/29/2020   Procedure: MRI WITH ANESTHESIA CERVICAL SPINE WITH AND WITHOUT CONTRAST, THORACIC SPINE WITH AND WITHOUT CONTRAST;  Surgeon: Radiologist, Medication, MD;  Location: [facility] OR;  Service: Radiology;  Laterality: N/A;   RADIOLOGY WITH ANESTHESIA N/A 03/15/2021   Procedure: MRI BRAIN WITH AND WITHOUT, CERIVAL WITH AND WITHOUT;  Surgeon: Radiologist, Medication, MD;  Location: [facility] OR;  Service: Radiology;  Laterality: N/A;   Patient Active Problem List   Diagnosis Date Noted    Depression 01/01/2023   Bipolar disease, chronic (HCC) 06/20/2021   Slurred speech 02/26/2021   Autism 12/20/2020   Multiple sclerosis (HCC) 10/11/2020   MRI of brain abnormal 10/11/2020   Right sided weakness 10/11/2020   Gait disturbance 10/11/2020   Asperger syndrome 03/19/2013    ONSET DATE: 09/25/2023  REFERRING DIAG: G35 (ICD-10-CM) - Multiple sclerosis (HCC) R53.1 (ICD-10-CM) - Right sided weakness R26.9 (ICD-10-CM) - Gait disturbance R53.1 (ICD-10-CM) - Weakness F84.0 (ICD-10-CM) - Autism R13.10 (ICD-10-CM) - Dysphagia, unspecified type F84.5 (ICD-10-CM) - Asperger syndrome  THERAPY DIAG:  Other lack of coordination  Muscle weakness (generalized)  Other symptoms and signs involving the nervous system  Rationale for Evaluation and Treatment: Rehabilitation  SUBJECTIVE:   SUBJECTIVE STATEMENT:  Pt arrived late - and was seen later and thanked OT for not just cancelling his appt.  Pt was seen after PT today and reported that he had a good session and actually was coming around to the idea of using a rollator due to success with it today.  Pt confirms that he found a male counselor and has an appt for next week.  Pt does report trying to be more his own person this week rather than relying on others for his happiness.  Pt reports getting used to the new L foot control pedal that was installed  in his car although he did have an issue with remembering to press the button to transition from factory gas pedal to the newly installed pedal.  Pt accompanied by: self  PERTINENT HISTORY: Autism/Aspergers, MS, Bipolar 1, depression, HTN, IBS, head injury 2020   PRECAUTIONS: Fall   WEIGHT BEARING RESTRICTIONS: No  PAIN:  Are you having pain? No  FALLS: Has patient fallen in last 6 months? No - almost fell at night due to veritigo but caught himself  LIVING ENVIRONMENT: Lives with: lives alone Lives in: House/apartment Stairs: Yes: External: 2 steps; on right going up (which  is his bad hand) but he also has a ramp he can use with 2 rails Has following equipment at home: Grab bars and Ramped entry  PLOF:  Independent with basic ADLs, Independent with gait, and still driving.  Previously reported considering paying to have groceries delivered   PATIENT GOALS: When asked about his goals, pt referred to his weaker R hand   OBJECTIVE:  Note: Objective measures were completed at Evaluation unless otherwise noted.  HAND DOMINANCE: Left  ADLs: Overall ADLs: Remains Ind but hates taking off his coat Transfers/ambulation related to ADLs: Eating: Ind but doesn't cut food Grooming: Ind UB Dressing: Pt has stopped buying shirts with buttons LB Dressing: He only wears cotton pants with no fasteners Toileting: No trouble Bathing: Tries to shower at least once every other day Tub Shower transfers: Mod Ind walk in shower, no seat, has bars Equipment: Grab bars  IADLs:  Shopping: Drives a [employer] - awaiting L foot control installed but goes shopping often  Light housekeeping: Is getting by ie) "trying not to get help" but finds it frustrating to do laundry.  He has a folding device for clothing but spreading them out it is a 'pain,' clothing organization is messy even though he has shelves for his clothes.  Pt reports he hates making bed.  He uses regular bed sheets - not fitted due to difficulty with putting them on the bed.  Meal Prep: Is getting by but with no real use of RUE Community mobility: No AE at this time Medication management: Ind Landscape architect: Ind Handwriting: NT  MOBILITY STATUS: Independent  POSTURE COMMENTS:  rounded shoulders and forward head Sitting balance: WFL  ACTIVITY TOLERANCE: Activity tolerance: WFL  FUNCTIONAL OUTCOME MEASURES: Quick Dash: 54.5  UPPER EXTREMITY ROM:    Active ROM Right eval Left eval  Shoulder flexion Limited from full range and requires extreme effort to lift overhead   Shoulder abduction Limitations  from full ROM in all RUE joints   Shoulder adduction    Shoulder extension    Shoulder internal rotation    Shoulder external rotation    Elbow flexion    Elbow extension    Wrist flexion    Wrist extension    Wrist ulnar deviation    Wrist radial deviation    Wrist pronation    Wrist supination    (Blank rows = not tested)  UPPER EXTREMITY MMT:     MMT Right eval Left eval  Shoulder flexion 3-   Shoulder abduction    Shoulder adduction    Shoulder extension    Shoulder internal rotation    Shoulder external rotation    Middle trapezius    Lower trapezius    Elbow flexion 4-   Elbow extension    Wrist flexion    Wrist extension 3-   Wrist ulnar deviation    Wrist radial deviation  Wrist pronation    Wrist supination 3-   (Blank rows = not tested)  HAND FUNCTION: Grip strength: Right: 38.5, 38.5, 40.7  lbs; Left: 82.0, 95.6, 86.1 lbs Average: Right 39.3 lbs Left 89.7 lbs  COORDINATION: Box and Blocks:  Right 52 blocks, Left 16 blocks  SENSATION: TBA  - previous report - Light touch: Impaired Some tingling in palmar surface   EDEMA: Slight RUE due to dependent position  MUSCLE TONE: RUE: Moderate  COGNITION: Overall cognitive status:  Pt WFL but limited with some complications due to autism ie) difficulty with problem solving and frustration management ~ although he does have some   VISION: Subjective report: He does reports some difficulty with L eye  Baseline vision:  WFL Visual history: NA   VISION ASSESSMENT: Not tested  Patient previously reported he has difficulty with some vision on L side by self report.   OBSERVATIONS: Pt ambulates with no AE and no loss of balance but with obvious limitations in R LE control. The pt appears well kept and has shaved his head with easy to don clothing.  Pt is visibly irritated with difficulty removing his jacket but is able to recognize his frustration.                                                                                                                             TODAY'S TREATMENT:    Therapeutic activities completed for duration noted including LEGO assembly with instruction and guidance to increase use of RUE, follow visual directions and discuss alternatives and options for max carryover at home ie) -Try stabilizing elbows/forearms on the table or against your body. -Use larger items ie) large building materials or larger puzzle pieces. -Take your time, including taking breaks and avoiding stress, fatigue, or rushing as this can increase incoordination and frustration. -Sit down for activities that require more control/coordination.  Pt encouraged to pick up pieces and stabilize his build with R hand, use washcloth under small pieces to increase ease with picking up objects and take breaks as needed to avoid frustration.  PATIENT EDUCATION: Education details: Functional FM activities Person educated: Patient Education method: Explanation, Demonstration, Tactile cues, and Verbal cues Education comprehension: verbalized understanding, returned demonstration, verbal cues required, tactile cues required, and needs further education  HOME EXERCISE PROGRAM: TBD  GOALS: Goals reviewed with patient? Yes  SHORT TERM GOALS: Target date: 11/07/23  Patient will demonstrate initial R UE HEP with 25% verbal cues or less for proper execution.  Baseline: Returning to outpt OT s/p 6 months Goal status: IN Progress  2.  Pt will verbalize understanding of adaptive strategies to increase ease with ADLS/IADLS   Baseline: Highly frustrated with removal of jacket Goal status: IN Progress  3.  Patient will independently verbalize at least 3 energy conservation principles in relation to ADLs to increase functional independence.  Baseline: Returning to outpt OT s/p 6 months Goal status: IN Progress  LONG TERM GOALS:  Target date: 12/05/23  Patient will demonstrate updated R UE HEP with visual  handouts only for proper execution. Baseline: Returning to outpt OT s/p 6 months Goal status: INITIAL  2.  Pt will verbalize understanding of adapted strategies and/or equipment PRN to increase ease with  IADLs (I.e. laundry, meal prep)  Baseline: Returning to outpt OT s/p 6 months Goal status: IN Progress  3.  Pt will be able to place at least 20 blocks using right hand with completion of Box and Blocks test. Baseline: 20 blocks Goal status: IN Progress  4.  Patient will demonstrate at least 40+ lbs RUE grip strength x 3 reps as needed to open jars and other containers. Baseline: Right: 38.5, 38.5, 40.7  lbs Average: 39.3 lbs Goal status: IN Progress 10/16/23: 44.9, 32.4, 39 x 3 trials 10/23/23: 34.1, 32.6, 38.5 x 3 trials  ASSESSMENT:  CLINICAL IMPRESSION: Patient is a 53 y.o. male who was seen today for occupational therapy treatment for multiple sclerosis and autism focusing on coordination and use of RUE this week. Pt had improved mood from last week and implemented some of the suggestions reviewed. Pt will benefit from continued skilled OT services in the outpatient setting to work on impairments as noted at eval to help pt return to higher level of RUE function as able.    PERFORMANCE DEFICITS: in functional skills including ADLs, IADLs, coordination, dexterity, proprioception, sensation, edema, tone, ROM, strength, flexibility, Fine motor control, Gross motor control, balance, body mechanics, endurance, decreased knowledge of precautions, decreased knowledge of use of DME, skin integrity, and UE functional use, cognitive skills including attention, emotional, energy/drive, learn, memory, perception, problem solving, safety awareness, and temperament/personality, and psychosocial skills including coping strategies, environmental adaptation, habits, interpersonal interactions, and routines and behaviors.   IMPAIRMENTS: are limiting patient from ADLs, IADLs, rest and sleep, work, leisure,  and social participation.   CO-MORBIDITIES: has co-morbidities such as bipolar and autism  that affects occupational performance. Patient will benefit from skilled OT to address above impairments and improve overall function.  REHAB POTENTIAL: Good  PLAN:  OT FREQUENCY: 1x/week  OT DURATION: 8 weeks + evaluation  PLANNED INTERVENTIONS: 97535 self care/ADL training, 27253 therapeutic exercise, 97530 therapeutic activity, 97112 neuromuscular re-education, [phone] aquatic therapy, [phone] Orthotics management and training, 59563 Subsequent splinting/medication, balance training, functional mobility training, psychosocial skills training, energy conservation, coping strategies training, patient/family education, and DME and/or AE instructions  RECOMMENDED OTHER SERVICES: Pt received P.T. neuro evaluation today and ortho eval yesterday and is awaiting modifications to his vehicle for L foot control.  Pt may benefit from getting connected with [facility] [place] for safer transportation with his vehicle is unavailable and/or further decline in functional abilities occurs.  CONSULTED AND AGREED WITH PLAN OF CARE: Patient  PLAN FOR NEXT SESSION:  Check on mood Provide community resources ie) https://www.autismsociety[place].org/adult-program-info/ HEP for RUE - coordination ideas - try LEGO/puzzles  AE for ADLs/IADLS Energy Conservation HANDOUTS Monitor for splint needs  Victorino Sparrow, OT 10/30/2023, 4:01 PM

## 2023-10-30 NOTE — Therapy (Signed)
 OUTPATIENT PHYSICAL THERAPY NEURO TREATMENT NOTE  Patient Name: George [name] MRN: [id number] DOB:Jun 03, 1971, 53 y.o., male Today's Date: 10/30/2023   PCP: Dr. Eleanora Neighbor REFERRING PROVIDER: Asa Lente, MD   END OF SESSION:  PT End of Session - 10/30/23 1040     Visit Number 4    Number of Visits 9    Date for PT Re-Evaluation 11/06/23    Authorization Type [employer]    PT Start Time 1040    PT Stop Time 1145    PT Time Calculation (min) 65 min    Equipment Utilized During Treatment Gait belt    Activity Tolerance Patient tolerated treatment well    Behavior During Therapy WFL for tasks assessed/performed             Past Medical History:  Diagnosis Date   Anxiety    Asperger's syndrome    Autistic spectrum disorder    Asperger's (per H&P Olivia Clelland, PA)   Bipolar 1 disorder (HCC)    "no mood swings in 20 years"  on medication   GERD (gastroesophageal reflux disease)    Head injury 2020   Hypertension    Hypothyroidism    IBS (irritable bowel syndrome)    Past Surgical History:  Procedure Laterality Date   NO PAST SURGERIES     RADIOLOGY WITH ANESTHESIA N/A 05/16/2020   Procedure: MRI BRAIN WITH AND WITHOUT CONTRAST;  Surgeon: Radiologist, Medication, MD;  Location: [facility];  Service: Radiology;  Laterality: N/A;   RADIOLOGY WITH ANESTHESIA N/A 08/29/2020   Procedure: MRI WITH ANESTHESIA CERVICAL SPINE WITH AND WITHOUT CONTRAST, THORACIC SPINE WITH AND WITHOUT CONTRAST;  Surgeon: Radiologist, Medication, MD;  Location: [facility];  Service: Radiology;  Laterality: N/A;   RADIOLOGY WITH ANESTHESIA N/A 03/15/2021   Procedure: MRI BRAIN WITH AND WITHOUT, CERIVAL WITH AND WITHOUT;  Surgeon: Radiologist, Medication, MD;  Location: [facility];  Service: Radiology;  Laterality: N/A;   Patient Active Problem List   Diagnosis Date Noted   Depression 01/01/2023   Bipolar disease, chronic (HCC) 06/20/2021   Slurred speech 02/26/2021   Autism  12/20/2020   Multiple sclerosis (HCC) 10/11/2020   MRI of brain abnormal 10/11/2020   Right sided weakness 10/11/2020   Gait disturbance 10/11/2020   Asperger syndrome 03/19/2013    ONSET DATE: 09/25/23 date of referral  REFERRING DIAG:  G35 (ICD-10-CM) - Multiple sclerosis (HCC)  R53.1 (ICD-10-CM) - Right sided weakness  R26.9 (ICD-10-CM) - Gait disturbance  R53.1 (ICD-10-CM) - Weakness  F84.0 (ICD-10-CM) - Autism  R13.10 (ICD-10-CM) - Dysphagia, unspecified type  F84.5 (ICD-10-CM) - Asperger syndrome    THERAPY DIAG:  Muscle weakness (generalized)  MS (multiple sclerosis) (HCC)  Unsteadiness on feet  Other abnormalities of gait and mobility  Rationale for Evaluation and Treatment: Rehabilitation  SUBJECTIVE:  SUBJECTIVE STATEMENT: Pt came in expressing his loneliness today. Pt reported that he is considering buying a $2000+ sex doll because they can get warm and he would feel better about companionship this way. Pt reports that he has gotten counselor and he starts sessions next week. Pt reports I got tired walking from waiting area to treatment room (~100 feet).   Pt accompanied by: self  PERTINENT HISTORY: Autism, MS, Bipolar 1, HTN, head injury 2020   PAIN:  Are you having pain? No  PRECAUTIONS: None  RED FLAGS: None   WEIGHT BEARING RESTRICTIONS: No  FALLS: Has patient fallen in last 6 months? No  LIVING ENVIRONMENT: Lives with: lives alone Lives in: House/apartment Stairs: No, has couple of steps in the front but uses ramp mostly Has following equipment at home: Ramped entry  PLOF: Independent  PATIENT GOALS: Strength of R LE; going up and down 3 steps, walking better without compensating  OBJECTIVE:  Note: Objective measures were completed at Evaluation unless  otherwise noted.  DIAGNOSTIC FINDINGS: no recent imaging  COGNITION: Overall cognitive status: Within functional limits for tasks assessed   SENSATION: Light touch: Impaired R foot   LOWER EXTREMITY ROM:     Active  Right Eval Left Eval  Hip flexion    Hip extension    Hip abduction    Hip adduction    Hip internal rotation    Hip external rotation    Knee flexion    Knee extension    Ankle dorsiflexion 5 20  Ankle plantarflexion    Ankle inversion    Ankle eversion     (Blank rows = not tested)  LOWER EXTREMITY MMT:    MMT Right Eval Left Eval  Hip flexion 5 5  Hip extension    Hip abduction    Hip adduction    Hip internal rotation    Hip external rotation    Knee flexion 3+ 5  Knee extension 5 5  Ankle dorsiflexion 3+ 5  Ankle plantarflexion 3+ 5  Ankle inversion    Ankle eversion    (Blank rows = not tested)  GAIT: Gait pattern: decreased arm swing- Right, decreased step length- Right, decreased stance time- Right, decreased stride length, decreased hip/knee flexion- Right, decreased ankle dorsiflexion- Right, Right foot flat, decreased trunk rotation, and poor foot clearance- Right Distance walked: 45' during the session Assistive device utilized: None Level of assistance: Complete Independence    PATIENT SURVEYS:  Patient Specific Functional Scale:  Activity: Walking = 5; going up/down stairs (3-4 steps) = 3; getting up/down from the floor = 4. Average 4.                                                                                                                              TREATMENT DATE:  Majority of the session was focused on providing patient education for self care, fatigue management, safety awareness, and making recommendations.  - We discussed at  length regarding his decision with sex doll and whether it was financially feasible for patient. Patient reported that potentially not. Pt was asked to reconsider and discuss his feelings and  his potential decisions to buy this with his counselor next week. Patient was educated to give 3-4 sessions with his counselor before he considers making that purchase. - We then discussed and provided MS support group options for the patient. One support group was for Men's support group for MS in [place] and one for general MS support group that is in [place]. Handouts were printed for patient. We discussed how talking to people with similar diagnosis will help him share his story and provide good resources and support structure for him. - We then discussed rolator. Patient reported that he doesn't want to use rolator because "women are not attracted him with me right now and if they see me using rolator, they run away even faster". Pt educated on focusing on himself rather than other people's perceptions of him. Patient educated on how rolator will help him with following: Improve walking endurance, reduce R UE elbow flexion and shoulder hike with walking (for which he is getting PT for shoulder elsewhere), improve his foot drop, prevent him from falling. Then we performed walking with rolator. Patient was able to walk 920' with rolator with SBA. He only had foot drag one time towards the end and towards the end his R knee fatigued and buckled during walk which caused LOB but he was able to prevent fall with the rolator. After walking, pt was educated on how rolator improved his walking endurance (100 ft vs 920 ft), improved his foot clerance with R LE, and how it prevented his fall. Then we discussed safety implications with potential adverse injuries of fall and how rolator can significantly impact and reduce his risk for fall in present/future. Per patient request, patient was provided 2 different walker options from [employer]. - leg press: 50lbs 2 x 10 bil, emphasizes on deep knee flexion without pain and controlled eccentric extension without "snap" (specially in R knee)   PATIENT EDUCATION: Education  details: see above Person educated: Patient Education method: Explanation Education comprehension: verbalized understanding  HOME EXERCISE PROGRAM: Access Code: [email or web address] URL: [email or web address] Date: 10/09/2023 Prepared by: Lavone Nian  Exercises - Seated Toe Raise  - 1 x daily - 7 x weekly - 2 sets - 10 reps - Seated Heel Raise  - 1 x daily - 7 x weekly - 2 sets - 10 reps - Seated Long Arc Quad  - 1 x daily - 7 x weekly - 2 sets - 10 reps - Seated March  - 1 x daily - 7 x weekly - 2 sets - 10 reps   GOALS: Goals reviewed with patient? Yes  SHORT TERM GOALS: Target date: 11/06/2023    Patient will demo understanding of HEP and will report 100% compliance with HEP for self management. Baseline: Initiated 10/09/23 Goal status: INITIAL   LONG TERM GOALS: Target date: 12/04/2023  Patient will demo 2 points improvement on PSFS to improve overall function.  Baseline: PSFS = 4 (10/09/23) Goal status: INITIAL   ASSESSMENT:  CLINICAL IMPRESSION: Majority of session was emphasized on patient education to improve making safer choices for his oveall wellbeing including mental, walking, decreasing fall risk. Pt also demonstrated significantly better walking endurance with use of rolator with improved gait quality. Pt sustained near fall during walking due to fatigue but was able to recover without fall due to his  rolator.  OBJECTIVE IMPAIRMENTS: Abnormal gait, decreased activity tolerance, decreased balance, decreased coordination, decreased endurance, difficulty walking, decreased ROM, decreased strength, impaired flexibility, impaired tone, impaired UE functional use, postural dysfunction, and pain.   ACTIVITY LIMITATIONS: carrying, lifting, standing, squatting, stairs, and transfers  PARTICIPATION LIMITATIONS: meal prep, cleaning, laundry, driving, shopping, and community activity  PERSONAL FACTORS: Time since onset of injury/illness/exacerbation are also affecting  patient's functional outcome.   REHAB POTENTIAL: Good  CLINICAL DECISION MAKING: Stable/uncomplicated  EVALUATION COMPLEXITY: Low  PLAN:  PT FREQUENCY: 1x/week  PT DURATION: 8 weeks  PLANNED INTERVENTIONS: 97164- PT Re-evaluation, 97110-Therapeutic exercises, 97530- Therapeutic activity, 97112- Neuromuscular re-education, 97535- Self Care, 16109- Manual therapy, [phone]- Gait training, [phone]- Orthotic Fit/training, Patient/Family education, Balance training, Stair training, Cognitive remediation, and Moist heat  PLAN FOR NEXT SESSION: Focus on following: Strength (dorsiflexors, hamstrings, hip flexors), gait without excessive R UE compensation, stairs, floor transfers.   Ileana Ladd, PT 10/30/2023, 10:41 AM

## 2023-11-03 DIAGNOSIS — F4320 Adjustment disorder, unspecified: Secondary | ICD-10-CM | POA: Diagnosis not present

## 2023-11-04 DIAGNOSIS — M25511 Pain in right shoulder: Secondary | ICD-10-CM | POA: Diagnosis not present

## 2023-11-06 ENCOUNTER — Other Ambulatory Visit: Payer: Self-pay | Admitting: Neurology

## 2023-11-06 ENCOUNTER — Ambulatory Visit: Attending: Neurology | Admitting: Occupational Therapy

## 2023-11-06 ENCOUNTER — Ambulatory Visit

## 2023-11-06 DIAGNOSIS — R2681 Unsteadiness on feet: Secondary | ICD-10-CM

## 2023-11-06 DIAGNOSIS — R41840 Attention and concentration deficit: Secondary | ICD-10-CM | POA: Insufficient documentation

## 2023-11-06 DIAGNOSIS — R208 Other disturbances of skin sensation: Secondary | ICD-10-CM | POA: Insufficient documentation

## 2023-11-06 DIAGNOSIS — M6281 Muscle weakness (generalized): Secondary | ICD-10-CM | POA: Insufficient documentation

## 2023-11-06 DIAGNOSIS — G35 Multiple sclerosis: Secondary | ICD-10-CM | POA: Insufficient documentation

## 2023-11-06 DIAGNOSIS — R269 Unspecified abnormalities of gait and mobility: Secondary | ICD-10-CM

## 2023-11-06 DIAGNOSIS — R41844 Frontal lobe and executive function deficit: Secondary | ICD-10-CM | POA: Insufficient documentation

## 2023-11-06 DIAGNOSIS — R29898 Other symptoms and signs involving the musculoskeletal system: Secondary | ICD-10-CM | POA: Insufficient documentation

## 2023-11-06 DIAGNOSIS — R29818 Other symptoms and signs involving the nervous system: Secondary | ICD-10-CM | POA: Insufficient documentation

## 2023-11-06 DIAGNOSIS — R2689 Other abnormalities of gait and mobility: Secondary | ICD-10-CM | POA: Diagnosis not present

## 2023-11-06 DIAGNOSIS — R278 Other lack of coordination: Secondary | ICD-10-CM | POA: Diagnosis not present

## 2023-11-06 NOTE — Therapy (Signed)
 OUTPATIENT OCCUPATIONAL THERAPY NEURO TREATMENT  Patient Name: George [name] MRN: [id number] DOB:04/03/1971, 53 y.o., male Today's Date: 11/06/2023  PCP: Emilio Aspen, MD REFERRING PROVIDER: Asa Lente, MD  END OF SESSION:  OT End of Session - 11/06/23 1019     Visit Number 5    Number of Visits 9    Date for OT Re-Evaluation 12/05/23    Authorization Type [employer] 2025    Authorization Time Period VL: [place] Auth Not Reqd    Progress Note Due on Visit 9    OT Start Time 1018    OT Stop Time 1100    OT Time Calculation (min) 42 min    Equipment Utilized During Treatment Markers/whitewash board    Activity Tolerance Patient tolerated treatment well   Pt limited by depression   Behavior During Therapy WFL for tasks assessed/performed              Past Medical History:  Diagnosis Date   Anxiety    Asperger's syndrome    Autistic spectrum disorder    Asperger's (per H&P Olivia Clelland, PA)   Bipolar 1 disorder (HCC)    "no mood swings in 20 years"  on medication   GERD (gastroesophageal reflux disease)    Head injury 2020   Hypertension    Hypothyroidism    IBS (irritable bowel syndrome)    Past Surgical History:  Procedure Laterality Date   NO PAST SURGERIES     RADIOLOGY WITH ANESTHESIA N/A 05/16/2020   Procedure: MRI BRAIN WITH AND WITHOUT CONTRAST;  Surgeon: Radiologist, Medication, MD;  Location: [facility] OR;  Service: Radiology;  Laterality: N/A;   RADIOLOGY WITH ANESTHESIA N/A 08/29/2020   Procedure: MRI WITH ANESTHESIA CERVICAL SPINE WITH AND WITHOUT CONTRAST, THORACIC SPINE WITH AND WITHOUT CONTRAST;  Surgeon: Radiologist, Medication, MD;  Location: [facility] OR;  Service: Radiology;  Laterality: N/A;   RADIOLOGY WITH ANESTHESIA N/A 03/15/2021   Procedure: MRI BRAIN WITH AND WITHOUT, CERIVAL WITH AND WITHOUT;  Surgeon: Radiologist, Medication, MD;  Location: [facility] OR;  Service: Radiology;  Laterality: N/A;   Patient Active Problem List   Diagnosis Date  Noted   Depression 01/01/2023   Bipolar disease, chronic (HCC) 06/20/2021   Slurred speech 02/26/2021   Autism 12/20/2020   Multiple sclerosis (HCC) 10/11/2020   MRI of brain abnormal 10/11/2020   Right sided weakness 10/11/2020   Gait disturbance 10/11/2020   Asperger syndrome 03/19/2013    ONSET DATE: 09/25/2023  REFERRING DIAG: G35 (ICD-10-CM) - Multiple sclerosis (HCC) R53.1 (ICD-10-CM) - Right sided weakness R26.9 (ICD-10-CM) - Gait disturbance R53.1 (ICD-10-CM) - Weakness F84.0 (ICD-10-CM) - Autism R13.10 (ICD-10-CM) - Dysphagia, unspecified type F84.5 (ICD-10-CM) - Asperger syndrome  THERAPY DIAG:  Muscle weakness (generalized)  Other lack of coordination  Other symptoms and signs involving the nervous system  MS (multiple sclerosis) (HCC)  Rationale for Evaluation and Treatment: Rehabilitation  SUBJECTIVE:   SUBJECTIVE STATEMENT:  Pt reports is doing well this week.  He reports he has joined a gym and checking out things we can do and classes he can attend with encouragement to consider modifications needed for balance, activity tolerance and strength.   Pt reported recent frustration with RUE incoordination with PS5 controller and he tossed the gaming system.  Discussion held re: more appropriate options to deal with frustration.   Pt accompanied by: self  PERTINENT HISTORY: Autism/Aspergers, MS, Bipolar 1, depression, HTN, IBS, head injury 2020   PRECAUTIONS: Fall   WEIGHT BEARING RESTRICTIONS:  No  PAIN:  Are you having pain? No  FALLS: Has patient fallen in last 6 months? No - almost fell at night due to veritigo but caught himself  LIVING ENVIRONMENT: Lives with: lives alone Lives in: House/apartment Stairs: Yes: External: 2 steps; on right going up (which is his bad hand) but he also has a ramp he can use with 2 rails Has following equipment at home: Grab bars and Ramped entry  PLOF:  Independent with basic ADLs, Independent with gait, and still  driving.  Previously reported considering paying to have groceries delivered   PATIENT GOALS: When asked about his goals, pt referred to his weaker R hand   OBJECTIVE:  Note: Objective measures were completed at Evaluation unless otherwise noted.  HAND DOMINANCE: Left  ADLs: Overall ADLs: Remains Ind but hates taking off his coat Transfers/ambulation related to ADLs: Eating: Ind but doesn't cut food Grooming: Ind UB Dressing: Pt has stopped buying shirts with buttons LB Dressing: He only wears cotton pants with no fasteners Toileting: No trouble Bathing: Tries to shower at least once every other day Tub Shower transfers: Mod Ind walk in shower, no seat, has bars Equipment: Grab bars  IADLs:  Shopping: Drives a [employer] - awaiting L foot control installed but goes shopping often  Light housekeeping: Is getting by ie) "trying not to get help" but finds it frustrating to do laundry.  He has a folding device for clothing but spreading them out it is a 'pain,' clothing organization is messy even though he has shelves for his clothes.  Pt reports he hates making bed.  He uses regular bed sheets - not fitted due to difficulty with putting them on the bed.  Meal Prep: Is getting by but with no real use of RUE Community mobility: No AE at this time Medication management: Ind Landscape architect: Ind Handwriting: NT  MOBILITY STATUS: Independent  POSTURE COMMENTS:  rounded shoulders and forward head Sitting balance: WFL  ACTIVITY TOLERANCE: Activity tolerance: WFL  FUNCTIONAL OUTCOME MEASURES: Quick Dash: 54.5  UPPER EXTREMITY ROM:    Active ROM Right eval Left eval  Shoulder flexion Limited from full range and requires extreme effort to lift overhead   Shoulder abduction Limitations from full ROM in all RUE joints   Shoulder adduction    Shoulder extension    Shoulder internal rotation    Shoulder external rotation    Elbow flexion    Elbow extension    Wrist flexion     Wrist extension    Wrist ulnar deviation    Wrist radial deviation    Wrist pronation    Wrist supination    (Blank rows = not tested)  UPPER EXTREMITY MMT:     MMT Right eval Left eval  Shoulder flexion 3-   Shoulder abduction    Shoulder adduction    Shoulder extension    Shoulder internal rotation    Shoulder external rotation    Middle trapezius    Lower trapezius    Elbow flexion 4-   Elbow extension    Wrist flexion    Wrist extension 3-   Wrist ulnar deviation    Wrist radial deviation    Wrist pronation    Wrist supination 3-   (Blank rows = not tested)  HAND FUNCTION: Grip strength: Right: 38.5, 38.5, 40.7  lbs; Left: 82.0, 95.6, 86.1 lbs Average: Right 39.3 lbs Left 89.7 lbs  11/06/23 Left: 102.5, 107.3 - Average 104.9 lbs  COORDINATION: Box and  Blocks:  Right 52 blocks, Left 16 blocks  SENSATION: TBA  - previous report - Light touch: Impaired Some tingling in palmar surface   EDEMA: Slight RUE due to dependent position  MUSCLE TONE: RUE: Moderate  COGNITION: Overall cognitive status:  Pt WFL but limited with some complications due to autism ie) difficulty with problem solving and frustration management ~ although he does have some   VISION: Subjective report: He does reports some difficulty with L eye  Baseline vision:  WFL Visual history: NA   VISION ASSESSMENT: Not tested  Patient previously reported he has difficulty with some vision on L side by self report.   OBSERVATIONS: Pt ambulates with no AE and no loss of balance but with obvious limitations in R LE control. The pt appears well kept and has shaved his head with easy to don clothing.  Pt is visibly irritated with difficulty removing his jacket but is able to recognize his frustration.                                                                                                                            TODAY'S TREATMENT:    Therapeutic activities completed for duration noted  including art activity with instruction and guidance to increase use of RUE, work on stability of L thumb and consider alternatives and options for options for carryover at home ie) -opening and closing marker lids with BUEs -coloring with RUE after drawing with LUE.  Pt able to fill in shapes with R hand using whitewash pen on board with good success today -considering other artwork options versus his previous detailed oil paintings -trialled wrapping thumb for increased stability. Pt encouraged to pick up marker lids with R hand, with increased ease with picking up objects with thumb wrapped.  PATIENT EDUCATION: Education details: Functional FM art activities Person educated: Patient Education method: Explanation, Demonstration, Tactile cues, and Verbal cues Education comprehension: verbalized understanding, returned demonstration, verbal cues required, tactile cues required, and needs further education  HOME EXERCISE PROGRAM: TBD  GOALS: Goals reviewed with patient? Yes  SHORT TERM GOALS: Target date: 11/07/23  Patient will demonstrate initial R UE HEP with 25% verbal cues or less for proper execution.  Baseline: Returning to outpt OT s/p 6 months Goal status: IN Progress  2.  Pt will verbalize understanding of adaptive strategies to increase ease with ADLS/IADLS   Baseline: Highly frustrated with removal of jacket Goal status: IN Progress  3.  Patient will independently verbalize at least 3 energy conservation principles in relation to ADLs to increase functional independence.  Baseline: Returning to outpt OT s/p 6 months Goal status: IN Progress  LONG TERM GOALS: Target date: 12/05/23  Patient will demonstrate updated R UE HEP with visual handouts only for proper execution. Baseline: Returning to outpt OT s/p 6 months Goal status: INITIAL  2.  Pt will verbalize understanding of adapted strategies and/or equipment PRN to increase ease with  IADLs (I.e. laundry,  meal prep)   Baseline: Returning to outpt OT s/p 6 months Goal status: IN Progress  3.  Pt will be able to place at least 20 blocks using right hand with completion of Box and Blocks test. Baseline: 20 blocks Goal status: IN Progress  4.  Patient will demonstrate at least 40+ lbs RUE grip strength x 3 reps as needed to open jars and other containers. Baseline: Right: 38.5, 38.5, 40.7  lbs Average: 39.3 lbs Goal status: IN Progress 10/16/23: 44.9, 32.4, 39 x 3 trials 10/23/23: 34.1, 32.6, 38.5 x 3 trials 11/06/23: 41.0, 40.1, 42.1  ASSESSMENT:  CLINICAL IMPRESSION: Patient is a 53 y.o. male who was seen today for occupational therapy treatment for multiple sclerosis and autism focusing on coordination and use of RUE this week during previously enjoyed art activity. Pt had improved mood again today but would benefit from further guidance to work on frustration. Pt will benefit from continued skilled OT services in the outpatient setting to work on impairments as noted at eval to help pt return to higher level of RUE function as able.    PERFORMANCE DEFICITS: in functional skills including ADLs, IADLs, coordination, dexterity, proprioception, sensation, edema, tone, ROM, strength, flexibility, Fine motor control, Gross motor control, balance, body mechanics, endurance, decreased knowledge of precautions, decreased knowledge of use of DME, skin integrity, and UE functional use, cognitive skills including attention, emotional, energy/drive, learn, memory, perception, problem solving, safety awareness, and temperament/personality, and psychosocial skills including coping strategies, environmental adaptation, habits, interpersonal interactions, and routines and behaviors.   IMPAIRMENTS: are limiting patient from ADLs, IADLs, rest and sleep, work, leisure, and social participation.   CO-MORBIDITIES: has co-morbidities such as bipolar and autism  that affects occupational performance. Patient will benefit from  skilled OT to address above impairments and improve overall function.  REHAB POTENTIAL: Good  PLAN:  OT FREQUENCY: 1x/week  OT DURATION: 8 weeks + evaluation  PLANNED INTERVENTIONS: 97535 self care/ADL training, 60454 therapeutic exercise, 97530 therapeutic activity, 97112 neuromuscular re-education, [phone] aquatic therapy, [phone] Orthotics management and training, 29562 Subsequent splinting/medication, balance training, functional mobility training, psychosocial skills training, energy conservation, coping strategies training, patient/family education, and DME and/or AE instructions  RECOMMENDED OTHER SERVICES: Pt received P.T. neuro evaluation today and ortho eval yesterday and is awaiting modifications to his vehicle for L foot control.  Pt may benefit from getting connected with [facility] [place] for safer transportation with his vehicle is unavailable and/or further decline in functional abilities occurs.  CONSULTED AND AGREED WITH PLAN OF CARE: Patient  PLAN FOR NEXT SESSION:  Check on mood Provide community resources ie) https://[email or web address][email or web address].org/adult-program-info/ HEP for RUE - coordination ideas - try LEGO/puzzles/ART  AE for ADLs/IADLS Energy Conservation HANDOUTS Monitor for splint needs (thumb extension)  Victorino Sparrow, OT 11/06/2023, 5:00 PM

## 2023-11-06 NOTE — Therapy (Signed)
 OUTPATIENT PHYSICAL THERAPY NEURO TREATMENT NOTE  Patient Name: George [name] MRN: [id number] DOB:[birth year], 53 y.o., male Today's Date: 11/06/2023   PCP: Dr. Eleanora Neighbor REFERRING PROVIDER: Asa Lente, MD   END OF SESSION:  PT End of Session - 11/06/23 1057     Visit Number 5    Number of Visits 9    Date for PT Re-Evaluation 11/06/23    Authorization Type [employer]    PT Start Time 1100    PT Stop Time 1145    PT Time Calculation (min) 45 min    Equipment Utilized During Treatment Gait belt    Activity Tolerance Patient tolerated treatment well    Behavior During Therapy WFL for tasks assessed/performed             Past Medical History:  Diagnosis Date   Anxiety    Asperger's syndrome    Autistic spectrum disorder    Asperger's (per H&P Olivia Clelland, PA)   Bipolar 1 disorder (HCC)    "no mood swings in 20 years"  on medication   GERD (gastroesophageal reflux disease)    Head injury 2020   Hypertension    Hypothyroidism    IBS (irritable bowel syndrome)    Past Surgical History:  Procedure Laterality Date   NO PAST SURGERIES     RADIOLOGY WITH ANESTHESIA N/A 05/16/2020   Procedure: MRI BRAIN WITH AND WITHOUT CONTRAST;  Surgeon: Radiologist, Medication, MD;  Location: [facility];  Service: Radiology;  Laterality: N/A;   RADIOLOGY WITH ANESTHESIA N/A 08/29/2020   Procedure: MRI WITH ANESTHESIA CERVICAL SPINE WITH AND WITHOUT CONTRAST, THORACIC SPINE WITH AND WITHOUT CONTRAST;  Surgeon: Radiologist, Medication, MD;  Location: [facility];  Service: Radiology;  Laterality: N/A;   RADIOLOGY WITH ANESTHESIA N/A 03/15/2021   Procedure: MRI BRAIN WITH AND WITHOUT, CERIVAL WITH AND WITHOUT;  Surgeon: Radiologist, Medication, MD;  Location: [facility];  Service: Radiology;  Laterality: N/A;   Patient Active Problem List   Diagnosis Date Noted   Depression 01/01/2023   Bipolar disease, chronic (HCC) 06/20/2021   Slurred speech 02/26/2021   Autism 12/20/2020    Multiple sclerosis (HCC) 10/11/2020   MRI of brain abnormal 10/11/2020   Right sided weakness 10/11/2020   Gait disturbance 10/11/2020   Asperger syndrome 03/19/2013    ONSET DATE: 09/25/23 date of referral  REFERRING DIAG:  G35 (ICD-10-CM) - Multiple sclerosis (HCC)  R53.1 (ICD-10-CM) - Right sided weakness  R26.9 (ICD-10-CM) - Gait disturbance  R53.1 (ICD-10-CM) - Weakness  F84.0 (ICD-10-CM) - Autism  R13.10 (ICD-10-CM) - Dysphagia, unspecified type  F84.5 (ICD-10-CM) - Asperger syndrome    THERAPY DIAG:  Muscle weakness (generalized)  Unsteadiness on feet  MS (multiple sclerosis) (HCC)  Other abnormalities of gait and mobility  Rationale for Evaluation and Treatment: Rehabilitation  SUBJECTIVE:  SUBJECTIVE STATEMENT: Pt wanted to discuss which walker to get for himself.  Pt accompanied by: self  PERTINENT HISTORY: Autism, MS, Bipolar 1, HTN, head injury 2020   PAIN:  Are you having pain? No  PRECAUTIONS: None  RED FLAGS: None   WEIGHT BEARING RESTRICTIONS: No  FALLS: Has patient fallen in last 6 months? No  LIVING ENVIRONMENT: Lives with: lives alone Lives in: House/apartment Stairs: No, has couple of steps in the front but uses ramp mostly Has following equipment at home: Ramped entry  PLOF: Independent  PATIENT GOALS: Strength of R LE; going up and down 3 steps, walking better without compensating  OBJECTIVE:  Note: Objective measures were completed at Evaluation unless otherwise noted.  DIAGNOSTIC FINDINGS: no recent imaging  COGNITION: Overall cognitive status: Within functional limits for tasks assessed   SENSATION: Light touch: Impaired R foot   LOWER EXTREMITY ROM:     Active  Right Eval Left Eval  Hip flexion    Hip extension    Hip  abduction    Hip adduction    Hip internal rotation    Hip external rotation    Knee flexion    Knee extension    Ankle dorsiflexion 5 20  Ankle plantarflexion    Ankle inversion    Ankle eversion     (Blank rows = not tested)  LOWER EXTREMITY MMT:    MMT Right Eval Left Eval  Hip flexion 5 5  Hip extension    Hip abduction    Hip adduction    Hip internal rotation    Hip external rotation    Knee flexion 3+ 5  Knee extension 5 5  Ankle dorsiflexion 3+ 5  Ankle plantarflexion 3+ 5  Ankle inversion    Ankle eversion    (Blank rows = not tested)  GAIT: Gait pattern: decreased arm swing- Right, decreased step length- Right, decreased stance time- Right, decreased stride length, decreased hip/knee flexion- Right, decreased ankle dorsiflexion- Right, Right foot flat, decreased trunk rotation, and poor foot clearance- Right Distance walked: 39' during the session Assistive device utilized: None Level of assistance: Complete Independence    PATIENT SURVEYS:  Patient Specific Functional Scale:  Activity: Walking = 5; going up/down stairs (3-4 steps) = 3; getting up/down from the floor = 4. Average 4.                                                                                                                              TREATMENT DATE:  Reached out to patient's phsycian for rolator DME referral for [employer] with 10 inch wheels. Pt wanted to get pneumatic walker but pt was advised against it as it can have more maintenance.  Gait training: 1150 feet with rolator, with cues to slow down when fatigued    Bil leg press: 60lbs 10x Uni leg press: 30lbs 10x R and L   PATIENT EDUCATION: Education details: see above Person educated: Patient  Education method: Explanation Education comprehension: verbalized understanding  HOME EXERCISE PROGRAM: Access Code: [email or web address] URL: [email or web address] Date: 10/09/2023 Prepared by: Lavone Nian  Exercises - Seated Toe Raise  - 1 x daily - 7 x weekly - 2 sets - 10 reps - Seated Heel Raise  - 1 x daily - 7 x weekly - 2 sets - 10 reps - Seated Long Arc Quad  - 1 x daily - 7 x weekly - 2 sets - 10 reps - Seated March  - 1 x daily - 7 x weekly - 2 sets - 10 reps   GOALS: Goals reviewed with patient? Yes  SHORT TERM GOALS: Target date: 11/06/2023    Patient will demo understanding of HEP and will report 100% compliance with HEP for self management. Baseline: Initiated 10/09/23 Goal status: INITIAL   LONG TERM GOALS: Target date: 12/04/2023  Patient will demo 2 points improvement on PSFS to improve overall function.  Baseline: PSFS = 4 (10/09/23) Goal status: INITIAL   ASSESSMENT:  CLINICAL IMPRESSION: Session focused on improving gait mobility with his walker and continued education on potentially getting walker. At the end of the session, pt decided that he is most likely going to purchase walker out of pocket as he doesn't want to wait to go through insurance.  OBJECTIVE IMPAIRMENTS: Abnormal gait, decreased activity tolerance, decreased balance, decreased coordination, decreased endurance, difficulty walking, decreased ROM, decreased strength, impaired flexibility, impaired tone, impaired UE functional use, postural dysfunction, and pain.   ACTIVITY LIMITATIONS: carrying, lifting, standing, squatting, stairs, and transfers  PARTICIPATION LIMITATIONS: meal prep, cleaning, laundry, driving, shopping, and community activity  PERSONAL FACTORS: Time since onset of injury/illness/exacerbation are also affecting patient's functional outcome.   REHAB POTENTIAL: Good  CLINICAL DECISION MAKING: Stable/uncomplicated  EVALUATION COMPLEXITY: Low  PLAN:  PT FREQUENCY: 1x/week  PT DURATION: 8 weeks  PLANNED INTERVENTIONS: 97164- PT Re-evaluation, 97110-Therapeutic exercises, 97530- Therapeutic activity, 97112- Neuromuscular re-education, 97535- Self Care, 63016- Manual  therapy, [phone]- Gait training, [phone]- Orthotic Fit/training, Patient/Family education, Balance training, Stair training, Cognitive remediation, and Moist heat  PLAN FOR NEXT SESSION: Focus on following: Strength (dorsiflexors, hamstrings, hip flexors), gait without excessive R UE compensation, stairs, floor transfers.   Ileana Ladd, PT 11/06/2023, 10:57 AM

## 2023-11-07 ENCOUNTER — Encounter: Payer: Self-pay | Admitting: Neurology

## 2023-11-07 DIAGNOSIS — G35 Multiple sclerosis: Secondary | ICD-10-CM

## 2023-11-10 DIAGNOSIS — F4320 Adjustment disorder, unspecified: Secondary | ICD-10-CM | POA: Diagnosis not present

## 2023-11-10 NOTE — Telephone Encounter (Signed)
  Rollator order sent to ms roach who is apart of:  [facility]  [phone]

## 2023-11-11 DIAGNOSIS — E291 Testicular hypofunction: Secondary | ICD-10-CM | POA: Diagnosis not present

## 2023-11-13 ENCOUNTER — Ambulatory Visit

## 2023-11-13 ENCOUNTER — Ambulatory Visit: Admitting: Occupational Therapy

## 2023-11-13 ENCOUNTER — Encounter: Payer: Self-pay | Admitting: Neurology

## 2023-11-13 DIAGNOSIS — M6281 Muscle weakness (generalized): Secondary | ICD-10-CM | POA: Diagnosis not present

## 2023-11-13 DIAGNOSIS — R2681 Unsteadiness on feet: Secondary | ICD-10-CM

## 2023-11-13 DIAGNOSIS — R41840 Attention and concentration deficit: Secondary | ICD-10-CM

## 2023-11-13 DIAGNOSIS — R278 Other lack of coordination: Secondary | ICD-10-CM

## 2023-11-13 DIAGNOSIS — R2689 Other abnormalities of gait and mobility: Secondary | ICD-10-CM

## 2023-11-13 DIAGNOSIS — G35 Multiple sclerosis: Secondary | ICD-10-CM

## 2023-11-13 DIAGNOSIS — R41844 Frontal lobe and executive function deficit: Secondary | ICD-10-CM

## 2023-11-13 NOTE — Therapy (Signed)
 OUTPATIENT PHYSICAL THERAPY NEURO TREATMENT NOTE  Patient Name: George [name] MRN: [id number] DOB:[birth year], 53 y.o., male Today's Date: 11/13/2023   PCP: Dr. Eleanora Neighbor REFERRING PROVIDER: Asa Lente, MD   END OF SESSION:  PT End of Session - 11/13/23 1431     Visit Number 7    Number of Visits 9    Date for PT Re-Evaluation 11/06/23    Authorization Type [employer]    Equipment Utilized During Treatment Gait belt    Activity Tolerance Patient tolerated treatment well    Behavior During Therapy [facility] for tasks assessed/performed             Past Medical History:  Diagnosis Date   Anxiety    Asperger's syndrome    Autistic spectrum disorder    Asperger's (per H&P Olivia Clelland, PA)   Bipolar 1 disorder (HCC)    "no mood swings in 20 years"  on medication   GERD (gastroesophageal reflux disease)    Head injury 2020   Hypertension    Hypothyroidism    IBS (irritable bowel syndrome)    Past Surgical History:  Procedure Laterality Date   NO PAST SURGERIES     RADIOLOGY WITH ANESTHESIA N/A 05/16/2020   Procedure: MRI BRAIN WITH AND WITHOUT CONTRAST;  Surgeon: Radiologist, Medication, MD;  Location: [facility] OR;  Service: Radiology;  Laterality: N/A;   RADIOLOGY WITH ANESTHESIA N/A 08/29/2020   Procedure: MRI WITH ANESTHESIA CERVICAL SPINE WITH AND WITHOUT CONTRAST, THORACIC SPINE WITH AND WITHOUT CONTRAST;  Surgeon: Radiologist, Medication, MD;  Location: [facility] OR;  Service: Radiology;  Laterality: N/A;   RADIOLOGY WITH ANESTHESIA N/A 03/15/2021   Procedure: MRI BRAIN WITH AND WITHOUT, CERIVAL WITH AND WITHOUT;  Surgeon: Radiologist, Medication, MD;  Location: [facility] OR;  Service: Radiology;  Laterality: N/A;   Patient Active Problem List   Diagnosis Date Noted   Depression 01/01/2023   Bipolar disease, chronic (HCC) 06/20/2021   Slurred speech 02/26/2021   Autism 12/20/2020   Multiple sclerosis (HCC) 10/11/2020   MRI of brain abnormal 10/11/2020    Right sided weakness 10/11/2020   Gait disturbance 10/11/2020   Asperger syndrome 03/19/2013    ONSET DATE: 09/25/23 date of referral  REFERRING DIAG:  G35 (ICD-10-CM) - Multiple sclerosis (HCC)  R53.1 (ICD-10-CM) - Right sided weakness  R26.9 (ICD-10-CM) - Gait disturbance  R53.1 (ICD-10-CM) - Weakness  F84.0 (ICD-10-CM) - Autism  R13.10 (ICD-10-CM) - Dysphagia, unspecified type  F84.5 (ICD-10-CM) - Asperger syndrome    THERAPY DIAG:  Muscle weakness (generalized)  Unsteadiness on feet  MS (multiple sclerosis) (HCC)  Other abnormalities of gait and mobility  Rationale for Evaluation and Treatment: Rehabilitation  SUBJECTIVE:  SUBJECTIVE STATEMENT: Pt reports that he got the walker but hasn't been using it yet. Pt reports his walk doesn't feel like walk, "I feel like I am doing short distance walking from house to car, or room to room." "My relationship with my sister is affecting me as well"  Pt accompanied by: self  PERTINENT HISTORY: Autism, MS, Bipolar 1, HTN, head injury 2020   PAIN:  Are you having pain? No  PRECAUTIONS: None  RED FLAGS: None   WEIGHT BEARING RESTRICTIONS: No  FALLS: Has patient fallen in last 6 months? No  LIVING ENVIRONMENT: Lives with: lives alone Lives in: House/apartment Stairs: No, has couple of steps in the front but uses ramp mostly Has following equipment at home: Ramped entry  PLOF: Independent  PATIENT GOALS: Strength of R LE; going up and down 3 steps, walking better without compensating  OBJECTIVE:  Note: Objective measures were completed at Evaluation unless otherwise noted.  DIAGNOSTIC FINDINGS: no recent imaging  COGNITION: Overall cognitive status: Within functional limits for tasks assessed   SENSATION: Light touch: Impaired R  foot   LOWER EXTREMITY ROM:     Active  Right Eval Left Eval  Hip flexion    Hip extension    Hip abduction    Hip adduction    Hip internal rotation    Hip external rotation    Knee flexion    Knee extension    Ankle dorsiflexion 5 20  Ankle plantarflexion    Ankle inversion    Ankle eversion     (Blank rows = not tested)  LOWER EXTREMITY MMT:    MMT Right Eval Left Eval  Hip flexion 5 5  Hip extension    Hip abduction    Hip adduction    Hip internal rotation    Hip external rotation    Knee flexion 3+ 5  Knee extension 5 5  Ankle dorsiflexion 3+ 5  Ankle plantarflexion 3+ 5  Ankle inversion    Ankle eversion    (Blank rows = not tested)  GAIT: Gait pattern: decreased arm swing- Right, decreased step length- Right, decreased stance time- Right, decreased stride length, decreased hip/knee flexion- Right, decreased ankle dorsiflexion- Right, Right foot flat, decreased trunk rotation, and poor foot clearance- Right Distance walked: 61' during the session Assistive device utilized: None Level of assistance: Complete Independence    PATIENT SURVEYS:  Patient Specific Functional Scale:  Activity: Walking = 5; going up/down stairs (3-4 steps) = 3; getting up/down from the floor = 4. Average 4.                                                                                                                              TREATMENT DATE:  Pt educated on use of his rolator as it significantly reduces his foot drop and will significantly reduce his fall risk and improve his walking endurance. Pt ambulated 920 feet with rolator without any foot drag, needed  cues to slow down about half way through. Pt was educated to discuss his family related stress with his mental health provider to continue to work through some of that stress. We discussed how stress can impact his physical performance. We discussed on how thinking about "What he is going to be like in 2 years" can cause  added stress and present performance. We discussed to focuse on present performance as best as he can.  Walked to patient's care with rolator (no foot drag noted),and practiced lifting rolator in and out of the trunk 3x. Discussed safey mechanics on bending from waistnd grabbing the walker at mid shaft so he wont have to flex his shoulders as much to put it in the trunk of the car. We discussed stabilizing his balance by placing body against bumper to prevent any accidental falls.    PATIENT EDUCATION: Education details: see above Person educated: Patient Education method: Explanation Education comprehension: verbalized understanding  HOME EXERCISE PROGRAM: Access Code: [email or web address] URL: [email or web address] Date: 10/09/2023 Prepared by: Lavone Nian  Exercises - Seated Toe Raise  - 1 x daily - 7 x weekly - 2 sets - 10 reps - Seated Heel Raise  - 1 x daily - 7 x weekly - 2 sets - 10 reps - Seated Long Arc Quad  - 1 x daily - 7 x weekly - 2 sets - 10 reps - Seated March  - 1 x daily - 7 x weekly - 2 sets - 10 reps   GOALS: Goals reviewed with patient? Yes  SHORT TERM GOALS: Target date: 11/06/2023    Patient will demo understanding of HEP and will report 100% compliance with HEP for self management. Baseline: Initiated 10/09/23 Goal status: INITIAL   LONG TERM GOALS: Target date: 12/04/2023  Patient will demo 2 points improvement on PSFS to improve overall function.  Baseline: PSFS = 4 (10/09/23) Goal status: INITIAL   ASSESSMENT:  CLINICAL IMPRESSION: Today's session emphasized on continued education on gait safety with use of rolator. Pt is seeming willing to use rolator but compliance will be assessed over next few sessions.  OBJECTIVE IMPAIRMENTS: Abnormal gait, decreased activity tolerance, decreased balance, decreased coordination, decreased endurance, difficulty walking, decreased ROM, decreased strength, impaired flexibility, impaired tone, impaired UE  functional use, postural dysfunction, and pain.   ACTIVITY LIMITATIONS: carrying, lifting, standing, squatting, stairs, and transfers  PARTICIPATION LIMITATIONS: meal prep, cleaning, laundry, driving, shopping, and community activity  PERSONAL FACTORS: Time since onset of injury/illness/exacerbation are also affecting patient's functional outcome.   REHAB POTENTIAL: Good  CLINICAL DECISION MAKING: Stable/uncomplicated  EVALUATION COMPLEXITY: Low  PLAN:  PT FREQUENCY: 1x/week  PT DURATION: 8 weeks  PLANNED INTERVENTIONS: 97164- PT Re-evaluation, 97110-Therapeutic exercises, 97530- Therapeutic activity, 97112- Neuromuscular re-education, 97535- Self Care, 14782- Manual therapy, [phone]- Gait training, [phone]- Orthotic Fit/training, Patient/Family education, Balance training, Stair training, Cognitive remediation, and Moist heat  PLAN FOR NEXT SESSION: Focus on following: Strength (dorsiflexors, hamstrings, hip flexors), gait without excessive R UE compensation, stairs, floor transfers.   Ileana Ladd, PT 11/13/2023, 2:31 PM

## 2023-11-13 NOTE — Therapy (Signed)
 OUTPATIENT OCCUPATIONAL THERAPY NEURO TREATMENT  Patient Name: George [name] MRN: [id number] DOB:[birth year], 53 y.o., male Today's Date: 11/13/2023  PCP: Emilio Aspen, MD REFERRING PROVIDER: Asa Lente, MD  END OF SESSION:  OT End of Session - 11/13/23 1017     Visit Number 6    Number of Visits 9    Date for OT Re-Evaluation 12/05/23    Authorization Type [employer] 2025    Authorization Time Period VL: [place] Auth Not Reqd    Progress Note Due on Visit 9    OT Start Time 1020    OT Stop Time 1100    OT Time Calculation (min) 40 min    Equipment Utilized During Treatment --    Activity Tolerance Patient tolerated treatment well   Pt limited by depression   Behavior During Therapy Impulsive;Lability              Past Medical History:  Diagnosis Date   Anxiety    Asperger's syndrome    Autistic spectrum disorder    Asperger's (per H&P Olivia Clelland, PA)   Bipolar 1 disorder (HCC)    "no mood swings in 20 years"  on medication   GERD (gastroesophageal reflux disease)    Head injury 2020   Hypertension    Hypothyroidism    IBS (irritable bowel syndrome)    Past Surgical History:  Procedure Laterality Date   NO PAST SURGERIES     RADIOLOGY WITH ANESTHESIA N/A 05/16/2020   Procedure: MRI BRAIN WITH AND WITHOUT CONTRAST;  Surgeon: Radiologist, Medication, MD;  Location: [facility] OR;  Service: Radiology;  Laterality: N/A;   RADIOLOGY WITH ANESTHESIA N/A 08/29/2020   Procedure: MRI WITH ANESTHESIA CERVICAL SPINE WITH AND WITHOUT CONTRAST, THORACIC SPINE WITH AND WITHOUT CONTRAST;  Surgeon: Radiologist, Medication, MD;  Location: [facility] OR;  Service: Radiology;  Laterality: N/A;   RADIOLOGY WITH ANESTHESIA N/A 03/15/2021   Procedure: MRI BRAIN WITH AND WITHOUT, CERIVAL WITH AND WITHOUT;  Surgeon: Radiologist, Medication, MD;  Location: [facility] OR;  Service: Radiology;  Laterality: N/A;   Patient Active Problem List   Diagnosis Date Noted   Depression 01/01/2023    Bipolar disease, chronic (HCC) 06/20/2021   Slurred speech 02/26/2021   Autism 12/20/2020   Multiple sclerosis (HCC) 10/11/2020   MRI of brain abnormal 10/11/2020   Right sided weakness 10/11/2020   Gait disturbance 10/11/2020   Asperger syndrome 03/19/2013    ONSET DATE: 09/25/2023  REFERRING DIAG: G35 (ICD-10-CM) - Multiple sclerosis (HCC) R53.1 (ICD-10-CM) - Right sided weakness R26.9 (ICD-10-CM) - Gait disturbance R53.1 (ICD-10-CM) - Weakness F84.0 (ICD-10-CM) - Autism R13.10 (ICD-10-CM) - Dysphagia, unspecified type F84.5 (ICD-10-CM) - Asperger syndrome  THERAPY DIAG:  Other lack of coordination  Muscle weakness (generalized)  Frontal lobe and executive function deficit  Attention and concentration deficit  Rationale for Evaluation and Treatment: Rehabilitation  SUBJECTIVE:   SUBJECTIVE STATEMENT:  Pt reports his body is tired today and he is tired of feeling this way.  Pt able to be redirected to discuss issues OT can address and save other info to discuss with counsellor.    Pt reported he did go to the gym most days but reports no real attempt to participate and any art activities last week.   Did the egg squishy at least 2x/day.  Pt accompanied by: self  PERTINENT HISTORY: Autism/Aspergers, MS, Bipolar 1, depression, HTN, IBS, head injury 2020   PRECAUTIONS: Fall   WEIGHT BEARING RESTRICTIONS: No  PAIN:  Are  you having pain? No  FALLS: Has patient fallen in last 6 months? No - almost fell at night due to veritigo but caught himself  LIVING ENVIRONMENT: Lives with: lives alone Lives in: House/apartment Stairs: Yes: External: 2 steps; on right going up (which is his bad hand) but he also has a ramp he can use with 2 rails Has following equipment at home: Grab bars and Ramped entry  PLOF:  Independent with basic ADLs, Independent with gait, and still driving.  Previously reported considering paying to have groceries delivered   PATIENT GOALS: When asked  about his goals, pt referred to his weaker R hand   OBJECTIVE:  Note: Objective measures were completed at Evaluation unless otherwise noted.  HAND DOMINANCE: Left  ADLs: Overall ADLs: Remains Ind but hates taking off his coat Transfers/ambulation related to ADLs: Eating: Ind but doesn't cut food Grooming: Ind UB Dressing: Pt has stopped buying shirts with buttons LB Dressing: He only wears cotton pants with no fasteners Toileting: No trouble Bathing: Tries to shower at least once every other day Tub Shower transfers: Mod Ind walk in shower, no seat, has bars Equipment: Grab bars  IADLs:  Shopping: Drives a [employer] - awaiting L foot control installed but goes shopping often  Light housekeeping: Is getting by ie) "trying not to get help" but finds it frustrating to do laundry.  He has a folding device for clothing but spreading them out it is a 'pain,' clothing organization is messy even though he has shelves for his clothes.  Pt reports he hates making bed.  He uses regular bed sheets - not fitted due to difficulty with putting them on the bed.  Meal Prep: Is getting by but with no real use of RUE Community mobility: No AE at this time Medication management: Ind Landscape architect: Ind Handwriting: NT  MOBILITY STATUS: Independent  POSTURE COMMENTS:  rounded shoulders and forward head Sitting balance: WFL  ACTIVITY TOLERANCE: Activity tolerance: WFL  FUNCTIONAL OUTCOME MEASURES: Quick Dash: 54.5  UPPER EXTREMITY ROM:    Active ROM Right eval Left eval  Shoulder flexion Limited from full range and requires extreme effort to lift overhead   Shoulder abduction Limitations from full ROM in all RUE joints   Shoulder adduction    Shoulder extension    Shoulder internal rotation    Shoulder external rotation    Elbow flexion    Elbow extension    Wrist flexion    Wrist extension    Wrist ulnar deviation    Wrist radial deviation    Wrist pronation    Wrist  supination    (Blank rows = not tested)  UPPER EXTREMITY MMT:     MMT Right eval Left eval  Shoulder flexion 3-   Shoulder abduction    Shoulder adduction    Shoulder extension    Shoulder internal rotation    Shoulder external rotation    Middle trapezius    Lower trapezius    Elbow flexion 4-   Elbow extension    Wrist flexion    Wrist extension 3-   Wrist ulnar deviation    Wrist radial deviation    Wrist pronation    Wrist supination 3-   (Blank rows = not tested)  HAND FUNCTION: Grip strength: Right: 38.5, 38.5, 40.7  lbs; Left: 82.0, 95.6, 86.1 lbs Average: Right 39.3 lbs Left 89.7 lbs  11/06/23 Left: 102.5, 107.3 - Average 104.9 lbs  COORDINATION: Box and Blocks:  Right 52 blocks,  Left 16 blocks  SENSATION: TBA  - previous report - Light touch: Impaired Some tingling in palmar surface   EDEMA: Slight RUE due to dependent position  MUSCLE TONE: RUE: Moderate  COGNITION: Overall cognitive status:  Pt WFL but limited with some complications due to autism ie) difficulty with problem solving and frustration management ~ although he does have some   VISION: Subjective report: He does reports some difficulty with L eye  Baseline vision:  WFL Visual history: NA   VISION ASSESSMENT: Not tested  Patient previously reported he has difficulty with some vision on L side by self report.   OBSERVATIONS: Pt ambulates with no AE and no loss of balance but with obvious limitations in R LE control. The pt appears well kept and has shaved his head with easy to don clothing.  Pt is visibly irritated with difficulty removing his jacket but is able to recognize his frustration.                                                                                                                            TODAY'S TREATMENT:    Therapeutic activities completed for duration noted including assisting pt with tasks to ensure he gets the right evaluation for the modified gas pedal in his  car.  Pt assisted to contact  [facility], attempted to contact [employer] and then sent appropriate message to MD that he requested fill out the necessary PW.  Pt needed max cues for patience with phone calls and contact with MD office ie) he wanted to walk out of therapy office and go to the Neuro office next door but was redirected to make the appropriate contact through MyChart so he could do his PT. Pt able to report he is over stimulated but finds it difficult ie) "impossible" to manage issues on his own and was educated and assisted to begin addressing issue today via use of his resources etc.  PATIENT EDUCATION: Education details: Engineer, structural  Person educated: Patient Education method: Explanation, Demonstration, Tactile cues, and Verbal cues Education comprehension: verbalized understanding, returned demonstration, verbal cues required, tactile cues required, and needs further education  HOME EXERCISE PROGRAM: TBD  GOALS: Goals reviewed with patient? Yes  SHORT TERM GOALS: Target date: 11/07/23  Patient will demonstrate initial R UE HEP with 25% verbal cues or less for proper execution.  Baseline: Returning to outpt OT s/p 6 months Goal status: MET  2.  Pt will verbalize understanding of adaptive strategies to increase ease with ADLS/IADLS   Baseline: Highly frustrated with removal of jacket Goal status: IN Progress  3.  Patient will independently verbalize at least 3 energy conservation principles in relation to ADLs to increase functional independence.  Baseline: Returning to outpt OT s/p 6 months Goal status: IN Progress  LONG TERM GOALS: Target date: 12/05/23  Patient will demonstrate updated R UE HEP with visual handouts only for proper execution. Baseline: Returning to outpt OT s/p 6  months Goal status: IN Progress  2.  Pt will verbalize understanding of adapted strategies and/or equipment PRN to increase ease with  IADLs (I.e. laundry, meal prep)  Baseline: Returning to  outpt OT s/p 6 months Goal status: IN Progress  3.  Pt will be able to place at least 20 blocks using right hand with completion of Box and Blocks test. Baseline: 20 blocks Goal status: IN Progress  4.  Patient will demonstrate at least 40+ lbs RUE grip strength x 3 reps as needed to open jars and other containers. Baseline: Right: 38.5, 38.5, 40.7  lbs Average: 39.3 lbs Goal status: IN Progress 10/16/23: 44.9, 32.4, 39 x 3 trials 10/23/23: 34.1, 32.6, 38.5 x 3 trials 11/06/23: 41.0, 40.1, 42.1  ASSESSMENT:  CLINICAL IMPRESSION: Patient is a 53 y.o. male who was seen today for occupational therapy treatment for multiple sclerosis and autism focusing on cognitive task this week for appropriate community contact for his vehicle modifications. Pt had more depressed mood today compared to previous 2 weeks and needed max cues/guidance to work on frustration and response appropriately. Pt will benefit from continued skilled OT services in the outpatient setting to work on impairments as noted at eval to help pt return to higher level of RUE function as able.    PERFORMANCE DEFICITS: in functional skills including ADLs, IADLs, coordination, dexterity, proprioception, sensation, edema, tone, ROM, strength, flexibility, Fine motor control, Gross motor control, balance, body mechanics, endurance, decreased knowledge of precautions, decreased knowledge of use of DME, skin integrity, and UE functional use, cognitive skills including attention, emotional, energy/drive, learn, memory, perception, problem solving, safety awareness, and temperament/personality, and psychosocial skills including coping strategies, environmental adaptation, habits, interpersonal interactions, and routines and behaviors.   IMPAIRMENTS: are limiting patient from ADLs, IADLs, rest and sleep, work, leisure, and social participation.   CO-MORBIDITIES: has co-morbidities such as bipolar and autism  that affects occupational performance.  Patient will benefit from skilled OT to address above impairments and improve overall function.  REHAB POTENTIAL: Good  PLAN:  OT FREQUENCY: 1x/week  OT DURATION: 8 weeks + evaluation  PLANNED INTERVENTIONS: 97535 self care/ADL training, 16109 therapeutic exercise, 97530 therapeutic activity, 97112 neuromuscular re-education, [phone] aquatic therapy, [phone] Orthotics management and training, 91478 Subsequent splinting/medication, balance training, functional mobility training, psychosocial skills training, energy conservation, coping strategies training, patient/family education, and DME and/or AE instructions  RECOMMENDED OTHER SERVICES: Pt received P.T. neuro evaluation today and ortho eval yesterday and is awaiting modifications to his vehicle for L foot control.  Pt may benefit from getting connected with [facility] [place] for safer transportation with his vehicle is unavailable and/or further decline in functional abilities occurs.  CONSULTED AND AGREED WITH PLAN OF CARE: Patient  PLAN FOR NEXT SESSION:  Check on mood Check community resources ie) https://www.autismsociety-[place].org/adult-program-info/ HEP for RUE - coordination ideas - try LEGO/puzzles/ART  AE for ADLs/IADLS Energy Conservation HANDOUTS Monitor for splint needs (thumb extension)  Victorino Sparrow, OT 11/13/2023, 5:02 PM

## 2023-11-17 ENCOUNTER — Telehealth: Payer: Self-pay | Admitting: *Deleted

## 2023-11-17 DIAGNOSIS — F4320 Adjustment disorder, unspecified: Secondary | ICD-10-CM | POA: Diagnosis not present

## 2023-11-17 NOTE — Telephone Encounter (Signed)
 Pt dmv form refax on 11/17/2023

## 2023-11-17 NOTE — Telephone Encounter (Signed)
 Spoke w/ Abran Abrahams. Form already sent back in Feb 2025. They showed pt today while he stopped in office to confirm it was correct form. He verified. Abran Abrahams resent form today for pt, pt aware.

## 2023-11-18 DIAGNOSIS — M25511 Pain in right shoulder: Secondary | ICD-10-CM | POA: Diagnosis not present

## 2023-11-20 ENCOUNTER — Ambulatory Visit

## 2023-11-20 ENCOUNTER — Ambulatory Visit: Admitting: Occupational Therapy

## 2023-11-20 DIAGNOSIS — M6281 Muscle weakness (generalized): Secondary | ICD-10-CM | POA: Diagnosis not present

## 2023-11-20 DIAGNOSIS — R208 Other disturbances of skin sensation: Secondary | ICD-10-CM

## 2023-11-20 DIAGNOSIS — R2681 Unsteadiness on feet: Secondary | ICD-10-CM

## 2023-11-20 DIAGNOSIS — R41844 Frontal lobe and executive function deficit: Secondary | ICD-10-CM

## 2023-11-20 DIAGNOSIS — R2689 Other abnormalities of gait and mobility: Secondary | ICD-10-CM

## 2023-11-20 DIAGNOSIS — G35 Multiple sclerosis: Secondary | ICD-10-CM

## 2023-11-20 DIAGNOSIS — R29818 Other symptoms and signs involving the nervous system: Secondary | ICD-10-CM

## 2023-11-20 DIAGNOSIS — R278 Other lack of coordination: Secondary | ICD-10-CM

## 2023-11-20 DIAGNOSIS — R29898 Other symptoms and signs involving the musculoskeletal system: Secondary | ICD-10-CM

## 2023-11-20 NOTE — Therapy (Signed)
 OUTPATIENT OCCUPATIONAL THERAPY NEURO TREATMENT  Patient Name: George [name] MRN: [id number] DOB:January 10, 1971, 53 y.o., male Today's Date: 11/20/2023  PCP: Emilio Aspen, MD REFERRING PROVIDER: Asa Lente, MD  END OF SESSION:  OT End of Session - 11/20/23 1009     Visit Number 7    Number of Visits 9    Date for OT Re-Evaluation 12/05/23    Authorization Type [employer] 2025    Authorization Time Period VL: [place] Auth Not Reqd    Progress Note Due on Visit 9    OT Start Time 1015    OT Stop Time 1100    OT Time Calculation (min) 45 min    Activity Tolerance Treatment limited secondary to agitation   Pt limited by depression   Behavior During Therapy Agitated              Past Medical History:  Diagnosis Date   Anxiety    Asperger's syndrome    Autistic spectrum disorder    Asperger's (per H&P Olivia Clelland, PA)   Bipolar 1 disorder (HCC)    "no mood swings in 20 years"  on medication   GERD (gastroesophageal reflux disease)    Head injury 2020   Hypertension    Hypothyroidism    IBS (irritable bowel syndrome)    Past Surgical History:  Procedure Laterality Date   NO PAST SURGERIES     RADIOLOGY WITH ANESTHESIA N/A 05/16/2020   Procedure: MRI BRAIN WITH AND WITHOUT CONTRAST;  Surgeon: Radiologist, Medication, MD;  Location: [facility] OR;  Service: Radiology;  Laterality: N/A;   RADIOLOGY WITH ANESTHESIA N/A 08/29/2020   Procedure: MRI WITH ANESTHESIA CERVICAL SPINE WITH AND WITHOUT CONTRAST, THORACIC SPINE WITH AND WITHOUT CONTRAST;  Surgeon: Radiologist, Medication, MD;  Location: [facility] OR;  Service: Radiology;  Laterality: N/A;   RADIOLOGY WITH ANESTHESIA N/A 03/15/2021   Procedure: MRI BRAIN WITH AND WITHOUT, CERIVAL WITH AND WITHOUT;  Surgeon: Radiologist, Medication, MD;  Location: [facility] OR;  Service: Radiology;  Laterality: N/A;   Patient Active Problem List   Diagnosis Date Noted   Depression 01/01/2023   Bipolar disease, chronic (HCC) 06/20/2021    Slurred speech 02/26/2021   Autism 12/20/2020   Multiple sclerosis (HCC) 10/11/2020   MRI of brain abnormal 10/11/2020   Right sided weakness 10/11/2020   Gait disturbance 10/11/2020   Asperger syndrome 03/19/2013    ONSET DATE: 09/25/2023  REFERRING DIAG: G35 (ICD-10-CM) - Multiple sclerosis (HCC) R53.1 (ICD-10-CM) - Right sided weakness R26.9 (ICD-10-CM) - Gait disturbance R53.1 (ICD-10-CM) - Weakness F84.0 (ICD-10-CM) - Autism R13.10 (ICD-10-CM) - Dysphagia, unspecified type F84.5 (ICD-10-CM) - Asperger syndrome  THERAPY DIAG:  Other lack of coordination  Muscle weakness (generalized)  Frontal lobe and executive function deficit  Other disturbances of skin sensation  Other symptoms and signs involving the musculoskeletal system  Other symptoms and signs involving the nervous system  Rationale for Evaluation and Treatment: Rehabilitation  SUBJECTIVE:   SUBJECTIVE STATEMENT:  Pt arrived in a fairly good mood using his new rollator and asked for help locating option for long handle to help with applying deodorant.  He reports having been to the gym nearly every day this past week and talks to his counselor weekly.   Pt expressed frustration with communication issues at [facility] re: paperwork for the [facility] which was apparently sent 10/02/23.  He is still waiting on contact from [facility] though.  He also expressed frustration with his relationship with his sister and brother-in-law.  Pt became frustrated by challenging task and expressed ongoing frustration with living with all of the issues he has had to deal with over his life and how things can get worse.   He also reported that things are getting more and more impossible with his R hand ie) hard to to hold things etc.   Pt accompanied by: self  PERTINENT HISTORY: Autism/Aspergers, MS, Bipolar 1, depression, HTN, IBS, head injury 2020   PRECAUTIONS: Fall   WEIGHT BEARING RESTRICTIONS: No  PAIN:  Are you having pain?  No  FALLS: Has patient fallen in last 6 months? No - almost fell at night due to veritigo but caught himself  LIVING ENVIRONMENT: Lives with: lives alone Lives in: House/apartment Stairs: Yes: External: 2 steps; on right going up (which is his bad hand) but he also has a ramp he can use with 2 rails Has following equipment at home: Grab bars and Ramped entry  PLOF:  Independent with basic ADLs, Independent with gait, and still driving.  Previously reported considering paying to have groceries delivered   PATIENT GOALS: When asked about his goals, pt referred to his weaker R hand   OBJECTIVE:  Note: Objective measures were completed at Evaluation unless otherwise noted.  HAND DOMINANCE: Left  ADLs: Overall ADLs: Remains Ind but hates taking off his coat Transfers/ambulation related to ADLs: Eating: Ind but doesn't cut food Grooming: Ind UB Dressing: Pt has stopped buying shirts with buttons LB Dressing: He only wears cotton pants with no fasteners Toileting: No trouble Bathing: Tries to shower at least once every other day Tub Shower transfers: Mod Ind walk in shower, no seat, has bars Equipment: Grab bars  IADLs:  Shopping: Drives a Rav[employer] - awaiting L foot control installed but goes shopping often  Light housekeeping: Is getting by ie) "trying not to get help" but finds it frustrating to do laundry.  He has a folding device for clothing but spreading them out it is a 'pain,' clothing organization is messy even though he has shelves for his clothes.  Pt reports he hates making bed.  He uses regular bed sheets - not fitted due to difficulty with putting them on the bed.  Meal Prep: Is getting by but with no real use of RUE Community mobility: No AE at this time Medication management: Ind Landscape architect: Ind Handwriting: NT  MOBILITY STATUS: Independent  POSTURE COMMENTS:  rounded shoulders and forward head Sitting balance: WFL  ACTIVITY TOLERANCE: Activity  tolerance: WFL  FUNCTIONAL OUTCOME MEASURES: Quick Dash: 54.5  UPPER EXTREMITY ROM:    Active ROM Right eval Left eval  Shoulder flexion Limited from full range and requires extreme effort to lift overhead   Shoulder abduction Limitations from full ROM in all RUE joints   Shoulder adduction    Shoulder extension    Shoulder internal rotation    Shoulder external rotation    Elbow flexion    Elbow extension    Wrist flexion    Wrist extension    Wrist ulnar deviation    Wrist radial deviation    Wrist pronation    Wrist supination    (Blank rows = not tested)  UPPER EXTREMITY MMT:     MMT Right eval Left eval  Shoulder flexion 3-   Shoulder abduction    Shoulder adduction    Shoulder extension    Shoulder internal rotation    Shoulder external rotation    Middle trapezius    Lower trapezius  Elbow flexion 4-   Elbow extension    Wrist flexion    Wrist extension 3-   Wrist ulnar deviation    Wrist radial deviation    Wrist pronation    Wrist supination 3-   (Blank rows = not tested)  HAND FUNCTION: Grip strength: Right: 38.5, 38.5, 40.7  lbs; Left: 82.0, 95.6, 86.1 lbs Average: Right 39.3 lbs Left 89.7 lbs  11/06/23 Left: 102.5, 107.3 - Average 104.9 lbs  COORDINATION: Box and Blocks:  Right 52 blocks, Left 16 blocks  SENSATION: TBA  - previous report - Light touch: Impaired Some tingling in palmar surface   EDEMA: Slight RUE due to dependent position  MUSCLE TONE: RUE: Moderate  COGNITION: Overall cognitive status:  Pt WFL but limited with some complications due to autism ie) difficulty with problem solving and frustration management ~ although he does have some   VISION: Subjective report: He does reports some difficulty with L eye  Baseline vision:  WFL Visual history: NA   VISION ASSESSMENT: Not tested  Patient previously reported he has difficulty with some vision on L side by self report.   OBSERVATIONS: Pt ambulates with no AE and no  loss of balance but with obvious limitations in R LE control. The pt appears well kept and has shaved his head with easy to don clothing.  Pt is visibly irritated with difficulty removing his jacket but is able to recognize his frustration.                                                                                                                            TODAY'S TREATMENT:    Self Care: Explored options for AE with ADLS with trial of using L hand to apply deodorant under his L arm instead of using R hand but he was not satisfied with this option and described wanting a longer holder.  Did located a long handle for roll-on deodorant as well as option for spray deodorant assistant.    Therapeutic activities completed for duration noted including UE coordination tasks to work on B coordination, in hand manipulation and combined with a functional game.  Attempted to have pt participated in game of Uzzle at level(s) 1 using BUE for eye-hand coordination and cognition by matching game pieces to visual patterns. Pt demonstrating ability to solve level(s) 1/2 trials with frustration with need to change the position/orientation of rectangle/cube game pieces which were a good size for manipulating them with R hand.  Limited ease with directing him to increase use of R UE as he was frustrated with the cognitive aspect of task.   Pt became over stimulated/challenged and found it difficult to resume tasks ie) began expressing frustration with life health issues.  Max cues given to redirect pt away form negative talk and was educated and assisted to continue addressing issues he can control with use of his resources.  PATIENT EDUCATION: Education details: Self reflection  Person educated: Patient  Education method: Explanation and Verbal cues Education comprehension: verbalized understanding, verbal cues required, and needs further education  HOME EXERCISE PROGRAM: TBD  GOALS: Goals reviewed with  patient? Yes  SHORT TERM GOALS: Target date: 11/07/23  Patient will demonstrate initial R UE HEP with 25% verbal cues or less for proper execution.  Baseline: Returning to outpt OT s/p 6 months Goal status: MET  2.  Pt will verbalize understanding of adaptive strategies to increase ease with ADLS/IADLS   Baseline: Highly frustrated with removal of jacket Goal status: IN Progress  3.  Patient will independently verbalize at least 3 energy conservation principles in relation to ADLs to increase functional independence.  Baseline: Returning to outpt OT s/p 6 months Goal status: IN Progress  LONG TERM GOALS: Target date: 12/05/23  Patient will demonstrate updated R UE HEP with visual handouts only for proper execution. Baseline: Returning to outpt OT s/p 6 months Goal status: IN Progress  2.  Pt will verbalize understanding of adapted strategies and/or equipment PRN to increase ease with  IADLs (I.e. laundry, meal prep)  Baseline: Returning to outpt OT s/p 6 months Goal status: IN Progress  3.  Pt will be able to place at least 20 blocks using right hand with completion of Box and Blocks test. Baseline: 20 blocks Goal status: IN Progress  4.  Patient will demonstrate at least 40+ lbs RUE grip strength x 3 reps as needed to open jars and other containers. Baseline: Right: 38.5, 38.5, 40.7  lbs Average: 39.3 lbs Goal status: IN Progress 10/16/23: 44.9, 32.4, 39 x 3 trials 10/23/23: 34.1, 32.6, 38.5 x 3 trials 11/06/23: 41.0, 40.1, 42.1  ASSESSMENT:  CLINICAL IMPRESSION: Patient is a 53 y.o. male who was seen today for occupational therapy treatment for multiple sclerosis and autism focusing on cognitive task this week for appropriate community contact for his vehicle modifications. Pt had more depressed mood today compared to previous 2 weeks and needed max cues/guidance to work on frustration and response appropriately. Pt will benefit from continued skilled OT services in the outpatient  setting to work on impairments as noted at eval to help pt return to higher level of RUE function as able.    PERFORMANCE DEFICITS: in functional skills including ADLs, IADLs, coordination, dexterity, proprioception, sensation, edema, tone, ROM, strength, flexibility, Fine motor control, Gross motor control, balance, body mechanics, endurance, decreased knowledge of precautions, decreased knowledge of use of DME, skin integrity, and UE functional use, cognitive skills including attention, emotional, energy/drive, learn, memory, perception, problem solving, safety awareness, and temperament/personality, and psychosocial skills including coping strategies, environmental adaptation, habits, interpersonal interactions, and routines and behaviors.   IMPAIRMENTS: are limiting patient from ADLs, IADLs, rest and sleep, work, leisure, and social participation.   CO-MORBIDITIES: has co-morbidities such as bipolar and autism  that affects occupational performance. Patient will benefit from skilled OT to address above impairments and improve overall function.  REHAB POTENTIAL: Good  PLAN:  OT FREQUENCY: 1x/week  OT DURATION: 8 weeks + evaluation  PLANNED INTERVENTIONS: 97535 self care/ADL training, 16109 therapeutic exercise, 97530 therapeutic activity, 97112 neuromuscular re-education, [phone] aquatic therapy, [phone] Orthotics management and training, 91478 Subsequent splinting/medication, balance training, functional mobility training, psychosocial skills training, energy conservation, coping strategies training, patient/family education, and DME and/or AE instructions  RECOMMENDED OTHER SERVICES: Pt received P.T. neuro evaluation today and ortho eval yesterday and is awaiting modifications to his vehicle for L foot control.  Pt may benefit from getting connected with [facility] [place] for safer  transportation with his vehicle is unavailable and/or further decline in functional abilities occurs.  CONSULTED AND  AGREED WITH PLAN OF CARE: Patient  PLAN FOR NEXT SESSION:  Check on mood Check community resources ie) https://www.autismsociety-[place].org/adult-program-info/ HEP for RUE - coordination ideas - try LEGO/puzzles/ART  AE for ADLs/IADLS Energy Conservation HANDOUTS Monitor for splint needs (thumb extension) Printed HEP ideas AE options: https://www.activehands.com/shop/  DC plans verus renewing POC  Zora Hires, OT 11/20/2023, 2:59 PM

## 2023-11-20 NOTE — Therapy (Signed)
 OUTPATIENT PHYSICAL THERAPY NEURO TREATMENT NOTE  Patient Name: George [name] MRN: [id number] DOB:1970-09-13, 53 y.o., male Today's Date: 11/20/2023   PCP: Dr. Charlene Conners REFERRING PROVIDER: Jorie Newness, MD   END OF SESSION:  PT End of Session - 11/20/23 1107     Visit Number 8    Number of Visits 9    Date for PT Re-Evaluation 11/06/23    Authorization Type [employer]    PT Start Time 1100    PT Stop Time 1130    PT Time Calculation (min) 30 min    Equipment Utilized During Treatment --    Activity Tolerance Patient tolerated treatment well;Patient limited by fatigue    Behavior During Therapy WFL for tasks assessed/performed             Past Medical History:  Diagnosis Date   Anxiety    Asperger's syndrome    Autistic spectrum disorder    Asperger's (per H&P Olivia Clelland, PA)   Bipolar 1 disorder (HCC)    "no mood swings in 20 years"  on medication   GERD (gastroesophageal reflux disease)    Head injury 2020   Hypertension    Hypothyroidism    IBS (irritable bowel syndrome)    Past Surgical History:  Procedure Laterality Date   NO PAST SURGERIES     RADIOLOGY WITH ANESTHESIA N/A 05/16/2020   Procedure: MRI BRAIN WITH AND WITHOUT CONTRAST;  Surgeon: Radiologist, Medication, MD;  Location: [facility] OR;  Service: Radiology;  Laterality: N/A;   RADIOLOGY WITH ANESTHESIA N/A 08/29/2020   Procedure: MRI WITH ANESTHESIA CERVICAL SPINE WITH AND WITHOUT CONTRAST, THORACIC SPINE WITH AND WITHOUT CONTRAST;  Surgeon: Radiologist, Medication, MD;  Location: [facility];  Service: Radiology;  Laterality: N/A;   RADIOLOGY WITH ANESTHESIA N/A 03/15/2021   Procedure: MRI BRAIN WITH AND WITHOUT, CERIVAL WITH AND WITHOUT;  Surgeon: Radiologist, Medication, MD;  Location: [facility] OR;  Service: Radiology;  Laterality: N/A;   Patient Active Problem List   Diagnosis Date Noted   Depression 01/01/2023   Bipolar disease, chronic (HCC) 06/20/2021   Slurred speech 02/26/2021    Autism 12/20/2020   Multiple sclerosis (HCC) 10/11/2020   MRI of brain abnormal 10/11/2020   Right sided weakness 10/11/2020   Gait disturbance 10/11/2020   Asperger syndrome 03/19/2013    ONSET DATE: 09/25/23 date of referral  REFERRING DIAG:  G35 (ICD-10-CM) - Multiple sclerosis (HCC)  R53.1 (ICD-10-CM) - Right sided weakness  R26.9 (ICD-10-CM) - Gait disturbance  R53.1 (ICD-10-CM) - Weakness  F84.0 (ICD-10-CM) - Autism  R13.10 (ICD-10-CM) - Dysphagia, unspecified type  F84.5 (ICD-10-CM) - Asperger syndrome    THERAPY DIAG:  Muscle weakness (generalized)  Unsteadiness on feet  MS (multiple sclerosis) (HCC)  Other abnormalities of gait and mobility  Rationale for Evaluation and Treatment: Rehabilitation  SUBJECTIVE:  SUBJECTIVE STATEMENT: Pt came in with his new walker. I only have used it one time. "When I used it, people looked at me different. They are not thinking, I am going to have sex with this guy but they are thinking he is using walker"  Pt accompanied by: self  PERTINENT HISTORY: Autism, MS, Bipolar 1, HTN, head injury 2020   PAIN:  Are you having pain? No  PRECAUTIONS: None  RED FLAGS: None   WEIGHT BEARING RESTRICTIONS: No  FALLS: Has patient fallen in last 6 months? No  LIVING ENVIRONMENT: Lives with: lives alone Lives in: House/apartment Stairs: No, has couple of steps in the front but uses ramp mostly Has following equipment at home: Ramped entry  PLOF: Independent  PATIENT GOALS: Strength of R LE; going up and down 3 steps, walking better without compensating  OBJECTIVE:  Note: Objective measures were completed at Evaluation unless otherwise noted.  DIAGNOSTIC FINDINGS: no recent imaging  COGNITION: Overall cognitive status: Within functional  limits for tasks assessed   SENSATION: Light touch: Impaired R foot   LOWER EXTREMITY ROM:     Active  Right Eval Left Eval  Hip flexion    Hip extension    Hip abduction    Hip adduction    Hip internal rotation    Hip external rotation    Knee flexion    Knee extension    Ankle dorsiflexion 5 20  Ankle plantarflexion    Ankle inversion    Ankle eversion     (Blank rows = not tested)  LOWER EXTREMITY MMT:    MMT Right Eval Left Eval  Hip flexion 5 5  Hip extension    Hip abduction    Hip adduction    Hip internal rotation    Hip external rotation    Knee flexion 3+ 5  Knee extension 5 5  Ankle dorsiflexion 3+ 5  Ankle plantarflexion 3+ 5  Ankle inversion    Ankle eversion    (Blank rows = not tested)  GAIT: Gait pattern: decreased arm swing- Right, decreased step length- Right, decreased stance time- Right, decreased stride length, decreased hip/knee flexion- Right, decreased ankle dorsiflexion- Right, Right foot flat, decreased trunk rotation, and poor foot clearance- Right Distance walked: 44' during the session Assistive device utilized: None Level of assistance: Complete Independence    PATIENT SURVEYS:  Patient Specific Functional Scale:  Activity: Walking = 5; going up/down stairs (3-4 steps) = 3; getting up/down from the floor = 4. Average 4.                                                                                                                              TREATMENT DATE:  Started with patient education that we shouldn't be worried about patient's preception. Tried to discuss his comments about other people's pereception of "they are not going to have sex with him" but PT felt that patient may get more upset so  this conversation was initiated but then deferred/re-routed to discussion of his walker. Assessed walker's handle height for the patient. No adjustments needed to be made as pt had it at correct height.  Gait training: ambulated  1050' with his Rolator on oudoor level surface and 60 feet on grass with SBA/CGA. Practiced putting the walker in and out of the car. Pt given cues to keep his back against the rear door of the car for stability and then fold the walker in front of him and slide it in the car using his left arm. Reverse of the above process when taking it out. Performed it 3x in total. Pt educated that getting the walker in and out of the car will get easier as performs it more. Pt educated that he didn't trip his foot 1x today when using his rolator so he should continue to use it more to improve his walking endurance. Pt will require further and repeat education to continue to use assistive device for his safety and improved function with walking.  PATIENT EDUCATION: Education details: see above Person educated: Patient Education method: Explanation Education comprehension: verbalized understanding  HOME EXERCISE PROGRAM: Access Code: [email or web address] URL: [email or web address] Date: 10/09/2023 Prepared by: Lavone Nian  Exercises - Seated Toe Raise  - 1 x daily - 7 x weekly - 2 sets - 10 reps - Seated Heel Raise  - 1 x daily - 7 x weekly - 2 sets - 10 reps - Seated Long Arc Quad  - 1 x daily - 7 x weekly - 2 sets - 10 reps - Seated March  - 1 x daily - 7 x weekly - 2 sets - 10 reps   GOALS: Goals reviewed with patient? Yes  SHORT TERM GOALS: Target date: 11/06/2023    Patient will demo understanding of HEP and will report 100% compliance with HEP for self management. Baseline: Initiated 10/09/23 Goal status: INITIAL   LONG TERM GOALS: Target date: 12/04/2023  Patient will demo 2 points improvement on PSFS to improve overall function.  Baseline: PSFS = 4 (10/09/23) Goal status: INITIAL   ASSESSMENT:  CLINICAL IMPRESSION: Today's session emphasized on continued education on gait safety with use of rolator. Pt has purchased a rolator for himself but is not using it on regular basis. He  will required continued and ongoing education and encouragement for use of roaltor for improved safety and walking endurance.  OBJECTIVE IMPAIRMENTS: Abnormal gait, decreased activity tolerance, decreased balance, decreased coordination, decreased endurance, difficulty walking, decreased ROM, decreased strength, impaired flexibility, impaired tone, impaired UE functional use, postural dysfunction, and pain.   ACTIVITY LIMITATIONS: carrying, lifting, standing, squatting, stairs, and transfers  PARTICIPATION LIMITATIONS: meal prep, cleaning, laundry, driving, shopping, and community activity  PERSONAL FACTORS: Time since onset of injury/illness/exacerbation are also affecting patient's functional outcome.   REHAB POTENTIAL: Good  CLINICAL DECISION MAKING: Stable/uncomplicated  EVALUATION COMPLEXITY: Low  PLAN:  PT FREQUENCY: 1x/week  PT DURATION: 8 weeks  PLANNED INTERVENTIONS: 97164- PT Re-evaluation, 97110-Therapeutic exercises, 97530- Therapeutic activity, 97112- Neuromuscular re-education, 97535- Self Care, 54098- Manual therapy, [phone]- Gait training, [phone]- Orthotic Fit/training, Patient/Family education, Balance training, Stair training, Cognitive remediation, and Moist heat  PLAN FOR NEXT SESSION: Focus on following: Strength (dorsiflexors, hamstrings, hip flexors), gait without excessive R UE compensation, stairs, floor transfers.   Ileana Ladd, PT 11/20/2023, 11:45 AM

## 2023-11-25 DIAGNOSIS — E291 Testicular hypofunction: Secondary | ICD-10-CM | POA: Diagnosis not present

## 2023-11-27 ENCOUNTER — Ambulatory Visit

## 2023-11-27 ENCOUNTER — Ambulatory Visit: Admitting: Occupational Therapy

## 2023-11-27 DIAGNOSIS — R29818 Other symptoms and signs involving the nervous system: Secondary | ICD-10-CM

## 2023-11-27 DIAGNOSIS — M6281 Muscle weakness (generalized): Secondary | ICD-10-CM | POA: Diagnosis not present

## 2023-11-27 DIAGNOSIS — R278 Other lack of coordination: Secondary | ICD-10-CM

## 2023-11-27 DIAGNOSIS — R2689 Other abnormalities of gait and mobility: Secondary | ICD-10-CM

## 2023-11-27 DIAGNOSIS — R2681 Unsteadiness on feet: Secondary | ICD-10-CM

## 2023-11-27 DIAGNOSIS — R208 Other disturbances of skin sensation: Secondary | ICD-10-CM

## 2023-11-27 DIAGNOSIS — G35 Multiple sclerosis: Secondary | ICD-10-CM

## 2023-11-27 NOTE — Therapy (Signed)
 OUTPATIENT PHYSICAL THERAPY NEURO TREATMENT NOTE  Patient Name: George [name] MRN: [id number] DOB:July 12, 1971, 53 y.o., male Today's Date: 11/27/2023   PCP: Dr. Charlene Conners REFERRING PROVIDER: Jorie Newness, MD   END OF SESSION:    Past Medical History:  Diagnosis Date   Anxiety    Asperger's syndrome    Autistic spectrum disorder    Asperger's (per H&P Olivia Clelland, PA)   Bipolar 1 disorder (HCC)    "no mood swings in 20 years"  on medication   GERD (gastroesophageal reflux disease)    Head injury 2020   Hypertension    Hypothyroidism    IBS (irritable bowel syndrome)    Past Surgical History:  Procedure Laterality Date   NO PAST SURGERIES     RADIOLOGY WITH ANESTHESIA N/A 05/16/2020   Procedure: MRI BRAIN WITH AND WITHOUT CONTRAST;  Surgeon: Radiologist, Medication, MD;  Location: [facility] OR;  Service: Radiology;  Laterality: N/A;   RADIOLOGY WITH ANESTHESIA N/A 08/29/2020   Procedure: MRI WITH ANESTHESIA CERVICAL SPINE WITH AND WITHOUT CONTRAST, THORACIC SPINE WITH AND WITHOUT CONTRAST;  Surgeon: Radiologist, Medication, MD;  Location: [facility] OR;  Service: Radiology;  Laterality: N/A;   RADIOLOGY WITH ANESTHESIA N/A 03/15/2021   Procedure: MRI BRAIN WITH AND WITHOUT, CERIVAL WITH AND WITHOUT;  Surgeon: Radiologist, Medication, MD;  Location: [facility] OR;  Service: Radiology;  Laterality: N/A;   Patient Active Problem List   Diagnosis Date Noted   Depression 01/01/2023   Bipolar disease, chronic (HCC) 06/20/2021   Slurred speech 02/26/2021   Autism 12/20/2020   Multiple sclerosis (HCC) 10/11/2020   MRI of brain abnormal 10/11/2020   Right sided weakness 10/11/2020   Gait disturbance 10/11/2020   Asperger syndrome 03/19/2013    ONSET DATE: 09/25/23 date of referral  REFERRING DIAG:  G35 (ICD-10-CM) - Multiple sclerosis (HCC)  R53.1 (ICD-10-CM) - Right sided weakness  R26.9 (ICD-10-CM) - Gait disturbance  R53.1 (ICD-10-CM) - Weakness  F84.0 (ICD-10-CM) - Autism   R13.10 (ICD-10-CM) - Dysphagia, unspecified type  F84.5 (ICD-10-CM) - Asperger syndrome    THERAPY DIAG:  Muscle weakness (generalized)  Unsteadiness on feet  MS (multiple sclerosis) (HCC)  Other abnormalities of gait and mobility  Rationale for Evaluation and Treatment: Rehabilitation  SUBJECTIVE:                                                                                                                                                                                             SUBJECTIVE STATEMENT: Pt came in with cane. I bought the cane because it makes me look less old compared to when I am with the walker.  I feel like I can walk further with cane as well and with less drop foot. I am starting to get numbing feel in my R foot is there any exercise I can do to help me strengthen my R foot like how I am squeezing ball in hand.   Pt accompanied by: self  PERTINENT HISTORY: Autism, MS, Bipolar 1, HTN, head injury 2020   PAIN:  Are you having pain? No  PRECAUTIONS: None  RED FLAGS: None   WEIGHT BEARING RESTRICTIONS: No  FALLS: Has patient fallen in last 6 months? No  LIVING ENVIRONMENT: Lives with: lives alone Lives in: House/apartment Stairs: No, has couple of steps in the front but uses ramp mostly Has following equipment at home: Ramped entry  PLOF: Independent  PATIENT GOALS: Strength of R LE; going up and down 3 steps, walking better without compensating  OBJECTIVE:  Note: Objective measures were completed at Evaluation unless otherwise noted.  DIAGNOSTIC FINDINGS: no recent imaging  COGNITION: Overall cognitive status: Within functional limits for tasks assessed   SENSATION: Light touch: Impaired R foot   LOWER EXTREMITY ROM:     Active  Right Eval Left Eval  Hip flexion    Hip extension    Hip abduction    Hip adduction    Hip internal rotation    Hip external rotation    Knee flexion    Knee extension    Ankle dorsiflexion 5 20   Ankle plantarflexion    Ankle inversion    Ankle eversion     (Blank rows = not tested)  LOWER EXTREMITY MMT:    MMT Right Eval Left Eval  Hip flexion 5 5  Hip extension    Hip abduction    Hip adduction    Hip internal rotation    Hip external rotation    Knee flexion 3+ 5  Knee extension 5 5  Ankle dorsiflexion 3+ 5  Ankle plantarflexion 3+ 5  Ankle inversion    Ankle eversion    (Blank rows = not tested)  GAIT: Gait pattern: decreased arm swing- Right, decreased step length- Right, decreased stance time- Right, decreased stride length, decreased hip/knee flexion- Right, decreased ankle dorsiflexion- Right, Right foot flat, decreased trunk rotation, and poor foot clearance- Right Distance walked: 21' during the session Assistive device utilized: None Level of assistance: Complete Independence    PATIENT SURVEYS:  Patient Specific Functional Scale:  Activity: Walking = 5; going up/down stairs (3-4 steps) = 3; getting up/down from the floor = 4. Average 4.                                                                                                                              TREATMENT DATE:   Pt educated on differences of cane and walker that cane will only help a little to prevent fall when he trips with his R foot vs Rolator to help break fall significantly.   Tennis ball  massage to bottom of foot: 3' Towel scrunches with R foot: 10x Pt educated on not straining the whole body We discussed patient's exercise regimen in the gym, including calf raises, hamstring curls, Rowing,  Gait training: 1 x 460' with st[place] cane. Pt required min a one time as he lost his balance when using cane. Cues to slow down cadence to allow for ankle DF on R side.  We also practiced 1 x 460' with rolator to allow patient to see difference between cane and rolator. Pt had one bout of L LE giving away but was able to recover with SBA. After above walking, pt was educated that he can  comfortably walk about 100 ft before fatigue without AD, 460' with min A to prevent occasional fall (which is 4x better compare to no AD but gait quality was inconsistent as his step length varied a lot and didn't seem stable); and with Rolator he is able to walk >1000 feet with minimal fatigue and improved gait quality with significantly less instances of fall and if those happen, he is able to recover I with use of walker).  Bil leg press: 60lbs 2 x 10 Uni leg press: 40lbs 10x R and L  PATIENT EDUCATION: Education details: see above Person educated: Patient Education method: Explanation Education comprehension: verbalized understanding  HOME EXERCISE PROGRAM: Access Code: [email or web address] URL: [email or web address] Date: 10/09/2023 Prepared by: Susanna Epley  Exercises - Seated Toe Raise  - 1 x daily - 7 x weekly - 2 sets - 10 reps - Seated Heel Raise  - 1 x daily - 7 x weekly - 2 sets - 10 reps - Seated Long Arc Quad  - 1 x daily - 7 x weekly - 2 sets - 10 reps - Seated March  - 1 x daily - 7 x weekly - 2 sets - 10 reps   GOALS: Goals reviewed with patient? Yes  SHORT TERM GOALS: Target date: 11/06/2023    Patient will demo understanding of HEP and will report 100% compliance with HEP for self management. Baseline: Initiated 10/09/23 Goal status: INITIAL   LONG TERM GOALS: Target date: 12/04/2023  Patient will demo 2 points improvement on PSFS to improve overall function.  Baseline: PSFS = 4 (10/09/23) Goal status: INITIAL   ASSESSMENT:  CLINICAL IMPRESSION: Today's session focused on education on benefits of using rolator vs cane. Pt still needs further education on using most appropriate AD. Pt still will benefit from more usage of Rolator to prevent falls and improve gait quality and distance overall.   OBJECTIVE IMPAIRMENTS: Abnormal gait, decreased activity tolerance, decreased balance, decreased coordination, decreased endurance, difficulty walking, decreased  ROM, decreased strength, impaired flexibility, impaired tone, impaired UE functional use, postural dysfunction, and pain.   ACTIVITY LIMITATIONS: carrying, lifting, standing, squatting, stairs, and transfers  PARTICIPATION LIMITATIONS: meal prep, cleaning, laundry, driving, shopping, and community activity  PERSONAL FACTORS: Time since onset of injury/illness/exacerbation are also affecting patient's functional outcome.   REHAB POTENTIAL: Good  CLINICAL DECISION MAKING: Stable/uncomplicated  EVALUATION COMPLEXITY: Low  PLAN:  PT FREQUENCY: 1x/week  PT DURATION: 8 weeks  PLANNED INTERVENTIONS: 97164- PT Re-evaluation, 97110-Therapeutic exercises, 97530- Therapeutic activity, 97112- Neuromuscular re-education, 97535- Self Care, 54098- Manual therapy, [phone]- Gait training, [phone]- Orthotic Fit/training, Patient/Family education, Balance training, Stair training, Cognitive remediation, and Moist heat  PLAN FOR NEXT SESSION: Focus on following: Strength (dorsiflexors, hamstrings, hip flexors), gait without excessive R UE compensation, stairs, floor transfers.   Kristine Phalen, PT 11/27/2023, 11:06  AM

## 2023-11-27 NOTE — Therapy (Signed)
 OUTPATIENT OCCUPATIONAL THERAPY NEURO TREATMENT  Patient Name: George [name] MRN: [id number] DOB:[birth year], 53 y.o., male Today's Date: 11/27/2023  PCP: Benedetta Bradley, MD REFERRING PROVIDER: Jorie Newness, MD  END OF SESSION:  OT End of Session - 11/27/23 1015     Visit Number 8    Number of Visits 9    Date for OT Re-Evaluation 12/05/23    Authorization Type [employer] Adv [id number]    Authorization Time Period VL: [place] Auth Not Reqd    Progress Note Due on Visit 9    OT Start Time 1016    OT Stop Time 1100    OT Time Calculation (min) 44 min    Activity Tolerance Treatment limited secondary to agitation   Pt limited by depression   Behavior During Therapy Agitated              Past Medical History:  Diagnosis Date   Anxiety    Asperger's syndrome    Autistic spectrum disorder    Asperger's (per H&P Olivia Clelland, PA)   Bipolar 1 disorder (HCC)    "no mood swings in 20 years"  on medication   GERD (gastroesophageal reflux disease)    Head injury 2020   Hypertension    Hypothyroidism    IBS (irritable bowel syndrome)    Past Surgical History:  Procedure Laterality Date   NO PAST SURGERIES     RADIOLOGY WITH ANESTHESIA N/A 05/16/2020   Procedure: MRI BRAIN WITH AND WITHOUT CONTRAST;  Surgeon: Radiologist, Medication, MD;  Location: [facility];  Service: Radiology;  Laterality: N/A;   RADIOLOGY WITH ANESTHESIA N/A 08/29/2020   Procedure: MRI WITH ANESTHESIA CERVICAL SPINE WITH AND WITHOUT CONTRAST, THORACIC SPINE WITH AND WITHOUT CONTRAST;  Surgeon: Radiologist, Medication, MD;  Location: [facility];  Service: Radiology;  Laterality: N/A;   RADIOLOGY WITH ANESTHESIA N/A 03/15/2021   Procedure: MRI BRAIN WITH AND WITHOUT, CERIVAL WITH AND WITHOUT;  Surgeon: Radiologist, Medication, MD;  Location: [facility];  Service: Radiology;  Laterality: N/A;   Patient Active Problem List   Diagnosis Date Noted   Depression 01/01/2023   Bipolar disease, chronic (HCC) 06/20/2021    Slurred speech 02/26/2021   Autism 12/20/2020   Multiple sclerosis (HCC) 10/11/2020   MRI of brain abnormal 10/11/2020   Right sided weakness 10/11/2020   Gait disturbance 10/11/2020   Asperger syndrome 03/19/2013    ONSET DATE: 09/25/2023  REFERRING DIAG: G35 (ICD-10-CM) - Multiple sclerosis (HCC) R53.1 (ICD-10-CM) - Right sided weakness R26.9 (ICD-10-CM) - Gait disturbance R53.1 (ICD-10-CM) - Weakness F84.0 (ICD-10-CM) - Autism R13.10 (ICD-10-CM) - Dysphagia, unspecified type F84.5 (ICD-10-CM) - Asperger syndrome  THERAPY DIAG:  No diagnosis found.  Rationale for Evaluation and Treatment: Rehabilitation  SUBJECTIVE:   SUBJECTIVE STATEMENT:  Pt reports the squishy egg he has at home has been helping with the numbness in his hand - he squeezes it 3x/day x 2 minutes. However his R foot has been feeling more numb.  Pt arrived with cane today.  He said PT told him not to get it but it feels better to him in public.  Pt has new counselor (young male) due to dismissing his male Haematologist after he tried changing several appts over text and pt was not pleased with these changes.    Pt reports he started new daily exercise regiment and has been changing muscle groups daily ie) does one set of muscles 'heavy' one day and light on other days, then switches to a different muscle  group to work harder on the next day.  He reports eating protein after workouts too to help with recovery.  He has been going to a gym on [address].  Pt accompanied by: self  PERTINENT HISTORY: Autism/Aspergers, MS, Bipolar 1, depression, HTN, IBS, head injury 2020   PRECAUTIONS: Fall   WEIGHT BEARING RESTRICTIONS: No  PAIN:  Are you having pain? No  FALLS: Has patient fallen in last 6 months? No - almost fell at night due to veritigo but caught himself  LIVING ENVIRONMENT: Lives with: lives alone Lives in: House/apartment Stairs: Yes: External: 2 steps; on right going up (which is his bad hand) but he  also has a ramp he can use with 2 rails Has following equipment at home: Grab bars and Ramped entry  PLOF:  Independent with basic ADLs, Independent with gait, and still driving.  Previously reported considering paying to have groceries delivered   PATIENT GOALS: When asked about his goals, pt referred to his weaker R hand   OBJECTIVE:  Note: Objective measures were completed at Evaluation unless otherwise noted.  HAND DOMINANCE: Left  ADLs: Overall ADLs: Remains Ind but hates taking off his coat Transfers/ambulation related to ADLs: Eating: Ind but doesn't cut food Grooming: Ind UB Dressing: Pt has stopped buying shirts with buttons LB Dressing: He only wears cotton pants with no fasteners Toileting: No trouble Bathing: Tries to shower at least once every other day Tub Shower transfers: Mod Ind walk in shower, no seat, has bars Equipment: Grab bars  IADLs:  Shopping: Drives a [employer] - awaiting L foot control installed but goes shopping often  Light housekeeping: Is getting by ie) "trying not to get help" but finds it frustrating to do laundry.  He has a folding device for clothing but spreading them out it is a 'pain,' clothing organization is messy even though he has shelves for his clothes.  Pt reports he hates making bed.  He uses regular bed sheets - not fitted due to difficulty with putting them on the bed.  Meal Prep: Is getting by but with no real use of RUE Community mobility: No AE at this time Medication management: Ind Landscape architect: Ind Handwriting: NT  MOBILITY STATUS: Independent  POSTURE COMMENTS:  rounded shoulders and forward head Sitting balance: WFL  ACTIVITY TOLERANCE: Activity tolerance: WFL  FUNCTIONAL OUTCOME MEASURES: Quick Dash: 54.5  UPPER EXTREMITY ROM:    Active ROM Right eval Left eval  Shoulder flexion Limited from full range and requires extreme effort to lift overhead   Shoulder abduction Limitations from full ROM in all RUE  joints   Shoulder adduction    Shoulder extension    Shoulder internal rotation    Shoulder external rotation    Elbow flexion    Elbow extension    Wrist flexion    Wrist extension    Wrist ulnar deviation    Wrist radial deviation    Wrist pronation    Wrist supination    (Blank rows = not tested)  UPPER EXTREMITY MMT:     MMT Right eval Left eval  Shoulder flexion 3-   Shoulder abduction    Shoulder adduction    Shoulder extension    Shoulder internal rotation    Shoulder external rotation    Middle trapezius    Lower trapezius    Elbow flexion 4-   Elbow extension    Wrist flexion    Wrist extension 3-   Wrist ulnar deviation  Wrist radial deviation    Wrist pronation    Wrist supination 3-   (Blank rows = not tested)  HAND FUNCTION: Grip strength: Right: 38.5, 38.5, 40.7  lbs; Left: 82.0, 95.6, 86.1 lbs Average: Right 39.3 lbs Left 89.7 lbs  11/06/23 Left: 102.5, 107.3 - Average 104.9 lbs  COORDINATION: Box and Blocks:  Right 52 blocks, Left 16 blocks  SENSATION: TBA  - previous report - Light touch: Impaired Some tingling in palmar surface   EDEMA: Slight RUE due to dependent position  MUSCLE TONE: RUE: Moderate  COGNITION: Overall cognitive status:  Pt WFL but limited with some complications due to autism ie) difficulty with problem solving and frustration management ~ although he does have some   VISION: Subjective report: He does reports some difficulty with L eye  Baseline vision:  WFL Visual history: NA   VISION ASSESSMENT: Not tested  Patient previously reported he has difficulty with some vision on L side by self report.   OBSERVATIONS: Pt ambulates with no AE and no loss of balance but with obvious limitations in R LE control. The pt appears well kept and has shaved his head with easy to don clothing.  Pt is visibly irritated with difficulty removing his jacket but is able to recognize his frustration.                                                                                                                             TODAY'S TREATMENT:    Self Care: Explored options for AE with ADLS and for working out.    Pt shown options for modified game controllers, one handed nail clippers, zip grips, knork (knife/fork) and gripping aides with pt asking for certain images to be printed for him.  Website provided to him also https://www.activehands.com/ to look up additional options as there were over 12 pages of objects.    Pt engaged in trial of using Active Hand for holding small hand weight with patient able to apply the device, position the weight and perform lifting and elbow flexion with 4 lb weight.  Pt show less expensive options but shown the difference between the devices.      Attempted to provided ideas of activities to work on R UE isolated thumb movements but he self reported they were too frustrating.    PATIENT EDUCATION: Education details: adaptive equipment options  Person educated: Patient Education method: Programmer, multimedia, Demonstration, Verbal cues, and Handouts Education comprehension: verbalized understanding, verbal cues required, and needs further education  HOME EXERCISE PROGRAM: TBD  GOALS: Goals reviewed with patient? Yes  SHORT TERM GOALS: Target date: 11/07/23  Patient will demonstrate initial R UE HEP with 25% verbal cues or less for proper execution.  Baseline: Returning to outpt OT s/p 6 months Goal status: MET  2.  Pt will verbalize understanding of adaptive strategies to increase ease with ADLS/IADLS   Baseline: Highly frustrated with removal of jacket Goal status: IN Progress  3.  Patient will independently verbalize at least 3 energy conservation principles in relation to ADLs to increase functional independence.  Baseline: Returning to outpt OT s/p 6 months Goal status: IN Progress  LONG TERM GOALS: Target date: 12/05/23  Patient will demonstrate updated R UE HEP with visual handouts  only for proper execution. Baseline: Returning to outpt OT s/p 6 months Goal status: IN Progress  2.  Pt will verbalize understanding of adapted strategies and/or equipment PRN to increase ease with  IADLs (I.e. laundry, meal prep)  Baseline: Returning to outpt OT s/p 6 months Goal status: IN Progress  3.  Pt will be able to place at least 20 blocks using right hand with completion of Box and Blocks test. Baseline: 20 blocks Goal status: IN Progress  4.  Patient will demonstrate at least 40+ lbs RUE grip strength x 3 reps as needed to open jars and other containers. Baseline: Right: 38.5, 38.5, 40.7  lbs Average: 39.3 lbs Goal status: IN Progress 10/16/23: 44.9, 32.4, 39 x 3 trials 10/23/23: 34.1, 32.6, 38.5 x 3 trials 11/06/23: 41.0, 40.1, 42.1  ASSESSMENT:  CLINICAL IMPRESSION: Patient is a 53 y.o. male who was seen today for occupational therapy treatment for multiple sclerosis and autism focusing on adaptive equipment education today.  Pt in a better mood today with good acceptance of education and suggestions. Pt will benefit from continued skilled OT services in the outpatient setting to work on impairments as noted at eval to help pt return to higher level of RUE function as able.    PERFORMANCE DEFICITS: in functional skills including ADLs, IADLs, coordination, dexterity, proprioception, sensation, edema, tone, ROM, strength, flexibility, Fine motor control, Gross motor control, balance, body mechanics, endurance, decreased knowledge of precautions, decreased knowledge of use of DME, skin integrity, and UE functional use, cognitive skills including attention, emotional, energy/drive, learn, memory, perception, problem solving, safety awareness, and temperament/personality, and psychosocial skills including coping strategies, environmental adaptation, habits, interpersonal interactions, and routines and behaviors.   IMPAIRMENTS: are limiting patient from ADLs, IADLs, rest and sleep,  work, leisure, and social participation.   CO-MORBIDITIES: has co-morbidities such as bipolar and autism  that affects occupational performance. Patient will benefit from skilled OT to address above impairments and improve overall function.  REHAB POTENTIAL: Good  PLAN:  OT FREQUENCY: 1x/week  OT DURATION: 8 weeks + evaluation  PLANNED INTERVENTIONS: 97535 self care/ADL training, 16109 therapeutic exercise, 97530 therapeutic activity, 97112 neuromuscular re-education, [phone] aquatic therapy, [phone] Orthotics management and training, 91478 Subsequent splinting/medication, balance training, functional mobility training, psychosocial skills training, energy conservation, coping strategies training, patient/family education, and DME and/or AE instructions  RECOMMENDED OTHER SERVICES: Pt received P.T. neuro evaluation today and ortho eval yesterday and is awaiting modifications to his vehicle for L foot control.  Pt may benefit from getting connected with [facility] [place] for safer transportation with his vehicle is unavailable and/or further decline in functional abilities occurs.  CONSULTED AND AGREED WITH PLAN OF CARE: Patient  PLAN FOR NEXT SESSION:   Check community resources ie) https://www.autismsociety-[place].org/adult-program-info/ HEP for RUE - coordination ideas - try LEGO/puzzles/ART  AE for ADLs/IADLS Energy Conservation HANDOUTS Monitor for splint needs (thumb extension) Printed HEP ideas AE options: https://www.activehands.com/shop/  DC plans verus renewing POC  Zora Hires, OT 11/27/2023, 10:17 AM

## 2023-12-04 ENCOUNTER — Ambulatory Visit: Attending: Neurology | Admitting: Occupational Therapy

## 2023-12-04 ENCOUNTER — Ambulatory Visit

## 2023-12-04 DIAGNOSIS — R278 Other lack of coordination: Secondary | ICD-10-CM

## 2023-12-04 DIAGNOSIS — R208 Other disturbances of skin sensation: Secondary | ICD-10-CM

## 2023-12-04 DIAGNOSIS — R2681 Unsteadiness on feet: Secondary | ICD-10-CM | POA: Diagnosis not present

## 2023-12-04 DIAGNOSIS — M6281 Muscle weakness (generalized): Secondary | ICD-10-CM | POA: Diagnosis not present

## 2023-12-04 DIAGNOSIS — G35 Multiple sclerosis: Secondary | ICD-10-CM | POA: Insufficient documentation

## 2023-12-04 DIAGNOSIS — R29818 Other symptoms and signs involving the nervous system: Secondary | ICD-10-CM | POA: Diagnosis not present

## 2023-12-04 DIAGNOSIS — R2689 Other abnormalities of gait and mobility: Secondary | ICD-10-CM | POA: Insufficient documentation

## 2023-12-04 NOTE — Therapy (Signed)
 OUTPATIENT OCCUPATIONAL THERAPY NEURO TREATMENT & DISCHARGE SUMMARY  Patient Name: George [name] MRN: [id number] DOB:02/02/1971, 53 y.o., male Today's Date: 12/04/2023  PCP: Benedetta Bradley, MD REFERRING PROVIDER: Jorie Newness, MD  END OF SESSION:  OT End of Session - 12/04/23 1013     Visit Number 9    Number of Visits 9    Date for OT Re-Evaluation 12/05/23    Authorization Type [employer] 2025    Authorization Time Period VL: [place] Auth Not Reqd    Progress Note Due on Visit 9    OT Start Time 1014    OT Stop Time 1100    OT Time Calculation (min) 46 min    Activity Tolerance Patient tolerated treatment well   Pt limited by depression   Behavior During Therapy [facility] for tasks assessed/performed;Restless              Past Medical History:  Diagnosis Date   Anxiety    Asperger's syndrome    Autistic spectrum disorder    Asperger's (per H&P Olivia Clelland, PA)   Bipolar 1 disorder (HCC)    "no mood swings in 20 years"  on medication   GERD (gastroesophageal reflux disease)    Head injury 2020   Hypertension    Hypothyroidism    IBS (irritable bowel syndrome)    Past Surgical History:  Procedure Laterality Date   NO PAST SURGERIES     RADIOLOGY WITH ANESTHESIA N/A 05/16/2020   Procedure: MRI BRAIN WITH AND WITHOUT CONTRAST;  Surgeon: Radiologist, Medication, MD;  Location: [facility] OR;  Service: Radiology;  Laterality: N/A;   RADIOLOGY WITH ANESTHESIA N/A 08/29/2020   Procedure: MRI WITH ANESTHESIA CERVICAL SPINE WITH AND WITHOUT CONTRAST, THORACIC SPINE WITH AND WITHOUT CONTRAST;  Surgeon: Radiologist, Medication, MD;  Location: [facility] OR;  Service: Radiology;  Laterality: N/A;   RADIOLOGY WITH ANESTHESIA N/A 03/15/2021   Procedure: MRI BRAIN WITH AND WITHOUT, CERIVAL WITH AND WITHOUT;  Surgeon: Radiologist, Medication, MD;  Location: [facility] OR;  Service: Radiology;  Laterality: N/A;   Patient Active Problem List   Diagnosis Date Noted   Depression 01/01/2023    Bipolar disease, chronic (HCC) 06/20/2021   Slurred speech 02/26/2021   Autism 12/20/2020   Multiple sclerosis (HCC) 10/11/2020   MRI of brain abnormal 10/11/2020   Right sided weakness 10/11/2020   Gait disturbance 10/11/2020   Asperger syndrome 03/19/2013    ONSET DATE: 09/25/2023  REFERRING DIAG: G35 (ICD-10-CM) - Multiple sclerosis (HCC) R53.1 (ICD-10-CM) - Right sided weakness R26.9 (ICD-10-CM) - Gait disturbance R53.1 (ICD-10-CM) - Weakness F84.0 (ICD-10-CM) - Autism R13.10 (ICD-10-CM) - Dysphagia, unspecified type F84.5 (ICD-10-CM) - Asperger syndrome  THERAPY DIAG:  Muscle weakness (generalized)  Other lack of coordination  Other symptoms and signs involving the nervous system  MS (multiple sclerosis) (HCC)  Rationale for Evaluation and Treatment: Rehabilitation  SUBJECTIVE:   SUBJECTIVE STATEMENT:  Pt reported he has been continuing his new daily exercise regiment and still has been doing 1 'heavy' one day for each muscle group and light on other days, then switches to a different muscle group to work harder on the next day.  He also reported resisting eating out today and went home to eat which is better for his IBS.  Pt accompanied by: self  PERTINENT HISTORY: Autism/Aspergers, MS, Bipolar 1, depression, HTN, IBS, head injury 2020   PRECAUTIONS: Fall   WEIGHT BEARING RESTRICTIONS: No  PAIN:  Are you having pain? No  FALLS: Has patient  fallen in last 6 months? No - almost fell at night due to veritigo but caught himself  LIVING ENVIRONMENT: Lives with: lives alone Lives in: House/apartment Stairs: Yes: External: 2 steps; on right going up (which is his bad hand) but he also has a ramp he can use with 2 rails Has following equipment at home: Grab bars and Ramped entry  PLOF:  Independent with basic ADLs, Independent with gait, and still driving.  Previously reported considering paying to have groceries delivered   PATIENT GOALS: When asked about his goals,  pt referred to his weaker R hand   OBJECTIVE:  Note: Objective measures were completed at Evaluation unless otherwise noted.  HAND DOMINANCE: Left  ADLs: Overall ADLs: Remains Ind but hates taking off his coat Transfers/ambulation related to ADLs: Eating: Ind but doesn't cut food Grooming: Ind UB Dressing: Pt has stopped buying shirts with buttons LB Dressing: He only wears cotton pants with no fasteners Toileting: No trouble Bathing: Tries to shower at least once every other day Tub Shower transfers: Mod Ind walk in shower, no seat, has bars Equipment: Grab bars  IADLs:  Shopping: Drives a [employer] - awaiting L foot control installed but goes shopping often  Light housekeeping: Is getting by ie) "trying not to get help" but finds it frustrating to do laundry.  He has a folding device for clothing but spreading them out it is a 'pain,' clothing organization is messy even though he has shelves for his clothes.  Pt reports he hates making bed.  He uses regular bed sheets - not fitted due to difficulty with putting them on the bed.  Meal Prep: Is getting by but with no real use of RUE Community mobility: No AE at this time Medication management: Ind Landscape architect: Ind Handwriting: NT  MOBILITY STATUS: Independent  POSTURE COMMENTS:  rounded shoulders and forward head Sitting balance: WFL  ACTIVITY TOLERANCE: Activity tolerance: WFL  FUNCTIONAL OUTCOME MEASURES: Quick Dash: 54.5  UPPER EXTREMITY ROM:    Active ROM Right eval Left eval  Shoulder flexion Limited from full range and requires extreme effort to lift overhead   Shoulder abduction Limitations from full ROM in all RUE joints   Shoulder adduction    Shoulder extension    Shoulder internal rotation    Shoulder external rotation    Elbow flexion    Elbow extension    Wrist flexion    Wrist extension    Wrist ulnar deviation    Wrist radial deviation    Wrist pronation    Wrist supination    (Blank  rows = not tested)  UPPER EXTREMITY MMT:     MMT Right eval Left eval  Shoulder flexion 3-   Shoulder abduction    Shoulder adduction    Shoulder extension    Shoulder internal rotation    Shoulder external rotation    Middle trapezius    Lower trapezius    Elbow flexion 4-   Elbow extension    Wrist flexion    Wrist extension 3-   Wrist ulnar deviation    Wrist radial deviation    Wrist pronation    Wrist supination 3-   (Blank rows = not tested)  HAND FUNCTION: Grip strength: Right: 38.5, 38.5, 40.7  lbs; Left: 82.0, 95.6, 86.1 lbs Average: Right 39.3 lbs Left 89.7 lbs  11/06/23 Left: 102.5, 107.3 - Average 104.9 lbs 12/04/23 Left 111.9, 114.4, 113.9 Average 113. 2 lbs  Right - see below r/t strength goal.  COORDINATION: Box and Blocks:  Right 16blocks, Left 52blocks  DC 12/04/23 Right 22 blocks  SENSATION: TBA  - previous report - Light touch: Impaired Some tingling in palmar surface   EDEMA: Slight RUE due to dependent position  MUSCLE TONE: RUE: Moderate  COGNITION: Overall cognitive status:  Pt WFL but limited with some complications due to autism ie) difficulty with problem solving and frustration management ~ although he does have some   VISION: Subjective report: He does reports some difficulty with L eye  Baseline vision:  WFL Visual history: NA   VISION ASSESSMENT: Not tested  Patient previously reported he has difficulty with some vision on L side by self report.   OBSERVATIONS: Pt ambulates with no AE and no loss of balance but with obvious limitations in R LE control. The pt appears well kept and has shaved his head with easy to don clothing.  Pt is visibly irritated with difficulty removing his jacket but is able to recognize his frustration.                                                                                                                            TODAY'S TREATMENT:    Therapeutic Activities: Retested B grip strength and UE  coordination via Box and Blocks test with good improvements in both - see goals below for specific progress.  Reviewed recommendations to decrease stress and add relaxation/meditation activities to daily routine to avoid issues that result in increased difficulty with motor tasks.   Self Care: Patient education provided re: energy conservation principles in relation to ADLs and IADLS to increase functional activity tolerance and safety with daily activities.  Information emailed to pt per his request and included in pt instructions.  Reviewed ADL techniques with pt reporting good success with flipping jacket overhead for donning it.  Ideas re: clothing options explored including cotton elastic waist clothing.  Modified technique shown for putting hand inside waistband to pull up clothing easier rather than only putting thumb inside the waistband.   PATIENT EDUCATION: Education details: energy conservation  Person educated: Patient Education method: Programmer, multimedia, Demonstration, Verbal cues, and Handouts Education comprehension: verbalized understanding, verbal cues required, and needs further education  HOME EXERCISE PROGRAM: 12/04/23: energy conservation handouts  GOALS: Goals reviewed with patient? Yes  SHORT TERM GOALS: Target date: 11/07/23  Patient will demonstrate initial R UE HEP with 25% verbal cues or less for proper execution.  Baseline: Returning to outpt OT s/p 6 months Goal status: MET  2.  Pt will verbalize understanding of adaptive strategies to increase ease with ADLS/IADLS   Baseline: Highly frustrated with removal of jacket. Goal status: MET 12/04/23 Still frustrated but can use the ADL techniques from OT to be successful  3.  Patient will independently verbalize at least 3 energy conservation principles in relation to ADLs to increase functional independence.  Baseline: Returning to outpt OT s/p 6 months Goal status: MET 12/04/23 handouts provided, education complete,  pt  reports spacing activities out  LONG TERM GOALS: Target date: 12/05/23  Patient will demonstrate updated R UE HEP with visual handouts only for proper execution. Baseline: Returning to outpt OT s/p 6 months Goal status: MET  2.  Pt will verbalize understanding of adapted strategies and/or equipment PRN to increase ease with  IADLs (I.e. laundry, meal prep)  Baseline: Returning to outpt OT s/p 6 months Goal status: IN Progress  3.  Pt will be able to place at least 20 blocks using right hand with completion of Box and Blocks test. Baseline: 16 blocks Goal status: MET 12/04/22 22 blocks  4.  Patient will demonstrate at least 40+ lbs RUE grip strength x 3 reps as needed to open jars and other containers. Baseline: Right: 38.5, 38.5, 40.7  lbs Average: 39.3 lbs Goal status: MET 10/16/23: 44.9, 32.4, 39 x 3 trials 10/23/23: 34.1, 32.6, 38.5 x 3 trials 11/06/23: 41.0, 40.1, 42.1 12/04/23: 43.4, 41.4, 43.8   ASSESSMENT:  CLINICAL IMPRESSION: Patient is a 53 y.o. male who was seen today for occupational therapy treatment for multiple sclerosis and autism focusing on energy conservation and DC education today.  Pt in a better mood again today with good acceptance of education and suggestions. Patient is appropriate for discharge and no longer demonstrates medical necessity for continued skilled occupational therapy services.    PERFORMANCE DEFICITS: in functional skills including ADLs, IADLs, coordination, dexterity, proprioception, sensation, edema, tone, ROM, strength, flexibility, Fine motor control, Gross motor control, balance, body mechanics, endurance, decreased knowledge of precautions, decreased knowledge of use of DME, skin integrity, and UE functional use, cognitive skills including attention, emotional, energy/drive, learn, memory, perception, problem solving, safety awareness, and temperament/personality, and psychosocial skills including coping strategies, environmental adaptation, habits,  interpersonal interactions, and routines and behaviors.   IMPAIRMENTS: are limiting patient from ADLs, IADLs, rest and sleep, work, leisure, and social participation.   CO-MORBIDITIES: has co-morbidities such as bipolar and autism  that affects occupational performance. Patient will benefit from skilled OT to address above impairments and improve overall function.  REHAB POTENTIAL: Good  PLAN:  OCCUPATIONAL THERAPY DISCHARGE SUMMARY  Visits from Start of Care: 9  Current functional level related to goals / functional outcomes: Pt has met all goals to satisfactory levels and is pleased with outcomes.   Remaining deficits: Pt has ongoing RUE functional deficits but no pain.   Education / Equipment: Pt has all needed materials and education. Pt understands how to continue on with self-management. See tx notes for more details.   Patient agrees to discharge due to max benefits received from outpatient occupational therapy at this time.    Zora Hires, OT 12/04/2023, 4:25 PM

## 2023-12-04 NOTE — Patient Instructions (Signed)
 Energy Conservation Techniques  Sit for as many activities as possible.  Use slow, smooth movements.  Rushing increases discomfort.  Determine the necessity of performing the task.  Simplify those tasks that are necessary.  (Get clothes out of the dryer when they are warm instead of ironing, let dishes air dry, etc.)  Take frequent rests both during and between activities.  Avoid repetitive tasks.  Pre-plan your activities; try a daily and/or weekly schedule.  Spread out the activities that are most fatiguing (break up cleaning tasks over multiple days).  Remember to plan a balance of work, rest and recreation.  Consider the best time for each activity.  Do the most exertive task when you have the most energy.  Don't carry items if you can push them.  Slide, don't lift. Push, don't pull.  Utilize two hands when appropriate.  Maintain good posture and use proper body mechanics. Avoid remaining in one position for too long. When lifting, bend at the knees, not at the waist.  Exhale when bending down, inhale when straightening up. Carry objects as close to your body and as near to the center of the pelvis.  Avoid wasted body movements (position yourself for the task so that you avoid bending, twisting, etc. when possible).  Select the best working environment.  Consider lighting, ventilation, clothing, and equipment.  Organize your storage areas, making the items you use daily convenient.  Store heaviest items at waist height.  Store frequently used items between shoulders and knee height.  Consider leaving frequently used items on countertops.  (You can organize in storage baskets based on time used/purpose).  Feelings and emotions can be real causes of fatigue.  Try to avoid unnecessary worry, irritation, or frustration.  Avoid stress, it can also be a source of fatigue.  Get help from other people for difficult tasks.  16. Explore equipment or items that may be able to do the job  for you with greater ease.  (Electric can openers, blenders, lightweight items for cleaning, etc.)

## 2023-12-04 NOTE — Therapy (Signed)
 OUTPATIENT PHYSICAL THERAPY NEURO TREATMENT NOTE  Patient Name: George [name] MRN: [id number] DOB:10/23/1970, 53 y.o., male Today's Date: 12/04/2023   PCP: Dr. Charlene Conners REFERRING PROVIDER: Jorie Newness, MD   END OF SESSION:  PT End of Session - 12/04/23 1114     Visit Number 10    Number of Visits 18    Date for PT Re-Evaluation 01/29/24    Authorization Type [employer]    PT Start Time 1110    PT Stop Time 1145    PT Time Calculation (min) 35 min    Equipment Utilized During Treatment Gait belt    Activity Tolerance Patient tolerated treatment well;Patient limited by fatigue    Behavior During Therapy WFL for tasks assessed/performed              Past Medical History:  Diagnosis Date   Anxiety    Asperger's syndrome    Autistic spectrum disorder    Asperger's (per H&P Olivia Clelland, PA)   Bipolar 1 disorder (HCC)    "no mood swings in 20 years"  on medication   GERD (gastroesophageal reflux disease)    Head injury 2020   Hypertension    Hypothyroidism    IBS (irritable bowel syndrome)    Past Surgical History:  Procedure Laterality Date   NO PAST SURGERIES     RADIOLOGY WITH ANESTHESIA N/A 05/16/2020   Procedure: MRI BRAIN WITH AND WITHOUT CONTRAST;  Surgeon: Radiologist, Medication, MD;  Location: [facility] OR;  Service: Radiology;  Laterality: N/A;   RADIOLOGY WITH ANESTHESIA N/A 08/29/2020   Procedure: MRI WITH ANESTHESIA CERVICAL SPINE WITH AND WITHOUT CONTRAST, THORACIC SPINE WITH AND WITHOUT CONTRAST;  Surgeon: Radiologist, Medication, MD;  Location: [facility] OR;  Service: Radiology;  Laterality: N/A;   RADIOLOGY WITH ANESTHESIA N/A 03/15/2021   Procedure: MRI BRAIN WITH AND WITHOUT, CERIVAL WITH AND WITHOUT;  Surgeon: Radiologist, Medication, MD;  Location: [facility] OR;  Service: Radiology;  Laterality: N/A;   Patient Active Problem List   Diagnosis Date Noted   Depression 01/01/2023   Bipolar disease, chronic (HCC) 06/20/2021   Slurred speech  02/26/2021   Autism 12/20/2020   Multiple sclerosis (HCC) 10/11/2020   MRI of brain abnormal 10/11/2020   Right sided weakness 10/11/2020   Gait disturbance 10/11/2020   Asperger syndrome 03/19/2013    ONSET DATE: 09/25/23 date of referral  REFERRING DIAG:  G35 (ICD-10-CM) - Multiple sclerosis (HCC)  R53.1 (ICD-10-CM) - Right sided weakness  R26.9 (ICD-10-CM) - Gait disturbance  R53.1 (ICD-10-CM) - Weakness  F84.0 (ICD-10-CM) - Autism  R13.10 (ICD-10-CM) - Dysphagia, unspecified type  F84.5 (ICD-10-CM) - Asperger syndrome    THERAPY DIAG:  Muscle weakness (generalized) - Plan: PT plan of care cert/re-cert  Other lack of coordination - Plan: PT plan of care cert/re-cert  Other disturbances of skin sensation - Plan: PT plan of care cert/re-cert  Rationale for Evaluation and Treatment: Rehabilitation  SUBJECTIVE:  SUBJECTIVE STATEMENT: I am just feeling "off" today. Pt reports he has been walking with cane and walker intermittently but prefers to walk without it. Pt came in therapy session without cane.   Pt accompanied by: self  PERTINENT HISTORY: Autism, MS, Bipolar 1, HTN, head injury 2020   PAIN:  Are you having pain? No  PRECAUTIONS: None  RED FLAGS: None   WEIGHT BEARING RESTRICTIONS: No  FALLS: Has patient fallen in last 6 months? No  LIVING ENVIRONMENT: Lives with: lives alone Lives in: House/apartment Stairs: No, has couple of steps in the front but uses ramp mostly Has following equipment at home: Ramped entry  PLOF: Independent  PATIENT GOALS: Strength of R LE; going up and down 3 steps, walking better without compensating  OBJECTIVE:  Note: Objective measures were completed at Evaluation unless otherwise noted.  DIAGNOSTIC FINDINGS: no recent  imaging  COGNITION: Overall cognitive status: Within functional limits for tasks assessed   SENSATION: Light touch: Impaired R foot   LOWER EXTREMITY ROM:     Active  Right Eval Left Eval  Hip flexion    Hip extension    Hip abduction    Hip adduction    Hip internal rotation    Hip external rotation    Knee flexion    Knee extension    Ankle dorsiflexion 5 20  Ankle plantarflexion    Ankle inversion    Ankle eversion     (Blank rows = not tested)  LOWER EXTREMITY MMT:    MMT Right Eval Left Eval  Hip flexion 5 5  Hip extension    Hip abduction    Hip adduction    Hip internal rotation    Hip external rotation    Knee flexion 3+ 5  Knee extension 5 5  Ankle dorsiflexion 3+ 5  Ankle plantarflexion 3+ 5  Ankle inversion    Ankle eversion    (Blank rows = not tested)  GAIT: Gait pattern: decreased arm swing- Right, decreased step length- Right, decreased stance time- Right, decreased stride length, decreased hip/knee flexion- Right, decreased ankle dorsiflexion- Right, Right foot flat, decreased trunk rotation, and poor foot clearance- Right Distance walked: 67' during the session Assistive device utilized: None Level of assistance: Complete Independence    PATIENT SURVEYS:  Patient Specific Functional Scale: (eval) Activity: Walking = 5; going up/down stairs (3-4 steps) = 3; getting up/down from the floor = 4. Average 4.  Patient Specific Functional Scale: (12/04/23) Activity: Walking = 7; going up/down stairs (3-4 steps) = 3; getting up/down from the floor = 4. Average 4.67                                                                                                                               TREATMENT DATE:    Leg press: bil 70 lbs 2 x 10 Uni leg press: 40lbs 10x R and L Standing on tilt board: - AP direction with head  turns: 10x - ML direction with crossing arm to chest and back down: 10x  Gait training: 1 x 240' with CGA, cues for slower  cadence and being conscious of R foot to avoid foot drag.  PATIENT EDUCATION: Education details: see above Person educated: Patient Education method: Explanation Education comprehension: verbalized understanding  HOME EXERCISE PROGRAM: Access Code: [email or web address] URL: [email or web address] Date: 10/09/2023 Prepared by: Susanna Epley  Exercises - Seated Toe Raise  - 1 x daily - 7 x weekly - 2 sets - 10 reps - Seated Heel Raise  - 1 x daily - 7 x weekly - 2 sets - 10 reps - Seated Long Arc Quad  - 1 x daily - 7 x weekly - 2 sets - 10 reps - Seated March  - 1 x daily - 7 x weekly - 2 sets - 10 reps   GOALS: Goals reviewed with patient? Yes  SHORT TERM GOALS: Target date: 01/29/2024      Patient will demo understanding of HEP and will report 100% compliance with HEP for self management. Baseline: Initiated 10/09/23 Goal status: goal met   LONG TERM GOALS: Target date: 12/04/2023  Patient will demo 2 points improvement on PSFS to improve overall function.  Baseline: PSFS = 4 (10/09/23); PSFS = 4.7 points (12/04/23) Goal status: progressing continue   ASSESSMENT:  CLINICAL IMPRESSION: Patient has been seen for total of 10 sessions for gait and mobility disorder due to MS. Patient is making slow progress in therapy. Patient has demonstrated improvement in Patient specific functional scale from 4 points to 4.7 points (+2 points needed for minimal detected change). Patient is reporting improved gait quality overall with improved endurance and decreased frequency of near falls due to foot drop in R LE. The most appropriate AD for patient is Rolling walker to prevent fall, improve walking endurance, reduce fatigue and improve his drop foot on R LE. Patient demonstrates significat improvement when using walker as he is able to walk much further with use of rolator. Patient is reluctant with using rolator on consistent basis due to impaired self perception despite extensive education.  Patient is currently using [place] cane and rolator intermittently and using no AD for majority of time for in home and community ambulation. Pt is demonstrating overall improved use of AD which is reducing his fall risk. Patient will continue to benefit from skilled PT to specifically work on curb training, stair training, floor transfers and continued education for safey awareness with appropriate AD.  OBJECTIVE IMPAIRMENTS: Abnormal gait, decreased activity tolerance, decreased balance, decreased coordination, decreased endurance, difficulty walking, decreased ROM, decreased strength, impaired flexibility, impaired tone, impaired UE functional use, postural dysfunction, and pain.   ACTIVITY LIMITATIONS: carrying, lifting, standing, squatting, stairs, and transfers  PARTICIPATION LIMITATIONS: meal prep, cleaning, laundry, driving, shopping, and community activity  PERSONAL FACTORS: Time since onset of injury/illness/exacerbation are also affecting patient's functional outcome.   REHAB POTENTIAL: Good  CLINICAL DECISION MAKING: Stable/uncomplicated  EVALUATION COMPLEXITY: Low  PLAN:  PT FREQUENCY: 1x/week  PT DURATION: 8 weeks  PLANNED INTERVENTIONS: 97164- PT Re-evaluation, 97110-Therapeutic exercises, 97530- Therapeutic activity, 97112- Neuromuscular re-education, 97535- Self Care, 54098- Manual therapy, [phone]- Gait training, [phone]- Orthotic Fit/training, Patient/Family education, Balance training, Stair training, Cognitive remediation, and Moist heat  PLAN FOR NEXT SESSION: Focus on following: Strength (dorsiflexors, hamstrings, hip flexors), gait without excessive R UE compensation, stairs, floor transfers.   Kristine Phalen, PT 12/04/2023, 12:52 PM

## 2023-12-09 ENCOUNTER — Encounter: Payer: Self-pay | Admitting: Neurology

## 2023-12-09 DIAGNOSIS — E291 Testicular hypofunction: Secondary | ICD-10-CM | POA: Diagnosis not present

## 2023-12-11 ENCOUNTER — Ambulatory Visit

## 2023-12-11 ENCOUNTER — Encounter: Payer: Self-pay | Admitting: Neurology

## 2023-12-11 ENCOUNTER — Encounter: Admitting: Occupational Therapy

## 2023-12-12 ENCOUNTER — Ambulatory Visit

## 2023-12-12 ENCOUNTER — Other Ambulatory Visit: Payer: Self-pay | Admitting: Neurology

## 2023-12-12 DIAGNOSIS — M6281 Muscle weakness (generalized): Secondary | ICD-10-CM | POA: Diagnosis not present

## 2023-12-12 DIAGNOSIS — R2689 Other abnormalities of gait and mobility: Secondary | ICD-10-CM

## 2023-12-12 DIAGNOSIS — R2681 Unsteadiness on feet: Secondary | ICD-10-CM

## 2023-12-12 DIAGNOSIS — G35 Multiple sclerosis: Secondary | ICD-10-CM

## 2023-12-16 NOTE — Therapy (Signed)
 OUTPATIENT PHYSICAL THERAPY NEURO TREATMENT NOTE  Patient Name: George [name] MRN: [id number] DOB:1970-08-31, 53 y.o., male Today's Date: 12/16/2023   PCP: Dr. Charlene Conners REFERRING PROVIDER: Jorie Newness, MD   END OF SESSION:  PT End of Session - 12/12/23    Visit Number 11    Number of Visits 18    Date for PT Re-Evaluation 01/29/24    Authorization Type [employer]    PT Start Time 1145    PT Stop Time 1230    PT Time Calculation (min) 45 min    Equipment Utilized During Treatment Gait belt    Activity Tolerance Patient tolerated treatment well;Patient limited by fatigue    Behavior During Therapy WFL for tasks assessed/performed              Past Medical History:  Diagnosis Date   Anxiety    Asperger's syndrome    Autistic spectrum disorder    Asperger's (per H&P Olivia Clelland, PA)   Bipolar 1 disorder (HCC)    "no mood swings in 20 years"  on medication   GERD (gastroesophageal reflux disease)    Head injury 2020   Hypertension    Hypothyroidism    IBS (irritable bowel syndrome)    Past Surgical History:  Procedure Laterality Date   NO PAST SURGERIES     RADIOLOGY WITH ANESTHESIA N/A 05/16/2020   Procedure: MRI BRAIN WITH AND WITHOUT CONTRAST;  Surgeon: Radiologist, Medication, MD;  Location: [facility] OR;  Service: Radiology;  Laterality: N/A;   RADIOLOGY WITH ANESTHESIA N/A 08/29/2020   Procedure: MRI WITH ANESTHESIA CERVICAL SPINE WITH AND WITHOUT CONTRAST, THORACIC SPINE WITH AND WITHOUT CONTRAST;  Surgeon: Radiologist, Medication, MD;  Location: [facility] OR;  Service: Radiology;  Laterality: N/A;   RADIOLOGY WITH ANESTHESIA N/A 03/15/2021   Procedure: MRI BRAIN WITH AND WITHOUT, CERIVAL WITH AND WITHOUT;  Surgeon: Radiologist, Medication, MD;  Location: [facility] OR;  Service: Radiology;  Laterality: N/A;   Patient Active Problem List   Diagnosis Date Noted   Depression 01/01/2023   Bipolar disease, chronic (HCC) 06/20/2021   Slurred speech  02/26/2021   Autism 12/20/2020   Multiple sclerosis (HCC) 10/11/2020   MRI of brain abnormal 10/11/2020   Right sided weakness 10/11/2020   Gait disturbance 10/11/2020   Asperger syndrome 03/19/2013    ONSET DATE: 09/25/23 date of referral  REFERRING DIAG:  G35 (ICD-10-CM) - Multiple sclerosis (HCC)  R53.1 (ICD-10-CM) - Right sided weakness  R26.9 (ICD-10-CM) - Gait disturbance  R53.1 (ICD-10-CM) - Weakness  F84.0 (ICD-10-CM) - Autism  R13.10 (ICD-10-CM) - Dysphagia, unspecified type  F84.5 (ICD-10-CM) - Asperger syndrome    THERAPY DIAG:  Muscle weakness (generalized)  MS (multiple sclerosis) (HCC)  Unsteadiness on feet  Other abnormalities of gait and mobility  Rationale for Evaluation and Treatment: Rehabilitation  SUBJECTIVE:  SUBJECTIVE STATEMENT: Pt reports he uses walker very little. He is using cane mostly as he doesn't like to be seen using his cane.  Pt accompanied by: self  PERTINENT HISTORY: Autism, MS, Bipolar 1, HTN, head injury 2020   PAIN:  Are you having pain? No  PRECAUTIONS: None  RED FLAGS: None   WEIGHT BEARING RESTRICTIONS: No  FALLS: Has patient fallen in last 6 months? No  LIVING ENVIRONMENT: Lives with: lives alone Lives in: House/apartment Stairs: No, has couple of steps in the front but uses ramp mostly Has following equipment at home: Ramped entry  PLOF: Independent  PATIENT GOALS: Strength of R LE; going up and down 3 steps, walking better without compensating  OBJECTIVE:  Note: Objective measures were completed at Evaluation unless otherwise noted.  DIAGNOSTIC FINDINGS: no recent imaging  COGNITION: Overall cognitive status: Within functional limits for tasks assessed   SENSATION: Light touch: Impaired R foot   LOWER EXTREMITY  ROM:     Active  Right Eval Left Eval  Hip flexion    Hip extension    Hip abduction    Hip adduction    Hip internal rotation    Hip external rotation    Knee flexion    Knee extension    Ankle dorsiflexion 5 20  Ankle plantarflexion    Ankle inversion    Ankle eversion     (Blank rows = not tested)  LOWER EXTREMITY MMT:    MMT Right Eval Left Eval  Hip flexion 5 5  Hip extension    Hip abduction    Hip adduction    Hip internal rotation    Hip external rotation    Knee flexion 3+ 5  Knee extension 5 5  Ankle dorsiflexion 3+ 5  Ankle plantarflexion 3+ 5  Ankle inversion    Ankle eversion    (Blank rows = not tested)  GAIT: Gait pattern: decreased arm swing- Right, decreased step length- Right, decreased stance time- Right, decreased stride length, decreased hip/knee flexion- Right, decreased ankle dorsiflexion- Right, Right foot flat, decreased trunk rotation, and poor foot clearance- Right Distance walked: 1' during the session Assistive device utilized: None Level of assistance: Complete Independence    PATIENT SURVEYS:  Patient Specific Functional Scale: (eval) Activity: Walking = 5; going up/down stairs (3-4 steps) = 3; getting up/down from the floor = 4. Average 4.  Patient Specific Functional Scale: (12/04/23) Activity: Walking = 7; going up/down stairs (3-4 steps) = 3; getting up/down from the floor = 4. Average 4.67                                                                                                                               TREATMENT DATE:   Outdoor level and unlevel surface gait training with CGA with st. Point cane. Pt ambulated 200' on grass with cues to look down directly in front of him and about 300' on level surface  Floor transfers: stand to long sitting and back to standing with use of support surface near by. Performed 2x Pt educated on crawling to support surface to get up off the floor if he falls at home or decides to get  down to the floor for any activities.   Stair training: with cane in one hand and rail on opposite hand. 2x with rail on R and 2x with rail on L. Step to pattern for safety. Cues for up with L LE and down with R LE  Pt was again given verbal education on how rolator will improve his safety, reduce fall risk due to foot drop and improve his ability to walk further without significant fatigue when compared to st. Point cane.   PATIENT EDUCATION: Education details: see above Person educated: Patient Education method: Explanation Education comprehension: verbalized understanding  HOME EXERCISE PROGRAM: Access Code: [email or web address] URL: [email or web address] Date: 10/09/2023 Prepared by: Susanna Epley  Exercises - Seated Toe Raise  - 1 x daily - 7 x weekly - 2 sets - 10 reps - Seated Heel Raise  - 1 x daily - 7 x weekly - 2 sets - 10 reps - Seated Long Arc Quad  - 1 x daily - 7 x weekly - 2 sets - 10 reps - Seated March  - 1 x daily - 7 x weekly - 2 sets - 10 reps   GOALS: Goals reviewed with patient? Yes  SHORT TERM GOALS: Target date: 01/29/2024      Patient will demo understanding of HEP and will report 100% compliance with HEP for self management. Baseline: Initiated 10/09/23 Goal status: goal met   LONG TERM GOALS: Target date: 12/04/2023  Patient will demo 2 points improvement on PSFS to improve overall function.  Baseline: PSFS = 4 (10/09/23); PSFS = 4.7 points (12/04/23) Goal status: progressing continue   ASSESSMENT:  CLINICAL IMPRESSION: Tolerated session well. Pt continues to verbalize resistance to using rolator for his in home and community mobility despite extensive education.   OBJECTIVE IMPAIRMENTS: Abnormal gait, decreased activity tolerance, decreased balance, decreased coordination, decreased endurance, difficulty walking, decreased ROM, decreased strength, impaired flexibility, impaired tone, impaired UE functional use, postural dysfunction, and pain.    ACTIVITY LIMITATIONS: carrying, lifting, standing, squatting, stairs, and transfers  PARTICIPATION LIMITATIONS: meal prep, cleaning, laundry, driving, shopping, and community activity  PERSONAL FACTORS: Time since onset of injury/illness/exacerbation are also affecting patient's functional outcome.   REHAB POTENTIAL: Good  CLINICAL DECISION MAKING: Stable/uncomplicated  EVALUATION COMPLEXITY: Low  PLAN:  PT FREQUENCY: 1x/week  PT DURATION: 8 weeks  PLANNED INTERVENTIONS: 97164- PT Re-evaluation, 97110-Therapeutic exercises, 97530- Therapeutic activity, 97112- Neuromuscular re-education, 97535- Self Care, 86578- Manual therapy, [phone]- Gait training, [phone]- Orthotic Fit/training, Patient/Family education, Balance training, Stair training, Cognitive remediation, and Moist heat  PLAN FOR NEXT SESSION: Focus on following: Strength (dorsiflexors, hamstrings, hip flexors), gait without excessive R UE compensation, stairs, floor transfers.   Kristine Phalen, PT 12/16/2023, 8:54 AM

## 2023-12-18 ENCOUNTER — Ambulatory Visit

## 2023-12-18 DIAGNOSIS — M6281 Muscle weakness (generalized): Secondary | ICD-10-CM | POA: Diagnosis not present

## 2023-12-18 DIAGNOSIS — R2681 Unsteadiness on feet: Secondary | ICD-10-CM

## 2023-12-18 DIAGNOSIS — G35 Multiple sclerosis: Secondary | ICD-10-CM

## 2023-12-18 DIAGNOSIS — R2689 Other abnormalities of gait and mobility: Secondary | ICD-10-CM

## 2023-12-18 NOTE — Therapy (Signed)
 OUTPATIENT PHYSICAL THERAPY NEURO TREATMENT NOTE  Patient Name: George [name] MRN: [id number] DOB:31-Aug-1970, 53 y.o., male Today's Date: 12/18/2023   PCP: Dr. Charlene Conners REFERRING PROVIDER: Jorie Newness, MD   END OF SESSION:   PT End of Session - 12/18/23 1139     Visit Number 12    Number of Visits 18    Date for PT Re-Evaluation 01/29/24    Authorization Type [employer]    PT Start Time 1145    PT Stop Time 1230    PT Time Calculation (min) 45 min    Equipment Utilized During Treatment Gait belt    Activity Tolerance Patient tolerated treatment well;Patient limited by fatigue    Behavior During Therapy WFL for tasks assessed/performed                Past Medical History:  Diagnosis Date   Anxiety    Asperger's syndrome    Autistic spectrum disorder    Asperger's (per H&P Olivia Clelland, PA)   Bipolar 1 disorder (HCC)    "no mood swings in 20 years"  on medication   GERD (gastroesophageal reflux disease)    Head injury 2020   Hypertension    Hypothyroidism    IBS (irritable bowel syndrome)    Past Surgical History:  Procedure Laterality Date   NO PAST SURGERIES     RADIOLOGY WITH ANESTHESIA N/A 05/16/2020   Procedure: MRI BRAIN WITH AND WITHOUT CONTRAST;  Surgeon: Radiologist, Medication, MD;  Location: [facility];  Service: Radiology;  Laterality: N/A;   RADIOLOGY WITH ANESTHESIA N/A 08/29/2020   Procedure: MRI WITH ANESTHESIA CERVICAL SPINE WITH AND WITHOUT CONTRAST, THORACIC SPINE WITH AND WITHOUT CONTRAST;  Surgeon: Radiologist, Medication, MD;  Location: [facility];  Service: Radiology;  Laterality: N/A;   RADIOLOGY WITH ANESTHESIA N/A 03/15/2021   Procedure: MRI BRAIN WITH AND WITHOUT, CERIVAL WITH AND WITHOUT;  Surgeon: Radiologist, Medication, MD;  Location: [facility];  Service: Radiology;  Laterality: N/A;   Patient Active Problem List   Diagnosis Date Noted   Depression 01/01/2023   Bipolar disease, chronic (HCC) 06/20/2021   Slurred  speech 02/26/2021   Autism 12/20/2020   Multiple sclerosis (HCC) 10/11/2020   MRI of brain abnormal 10/11/2020   Right sided weakness 10/11/2020   Gait disturbance 10/11/2020   Asperger syndrome 03/19/2013    ONSET DATE: 09/25/23 date of referral  REFERRING DIAG:  G35 (ICD-10-CM) - Multiple sclerosis (HCC)  R53.1 (ICD-10-CM) - Right sided weakness  R26.9 (ICD-10-CM) - Gait disturbance  R53.1 (ICD-10-CM) - Weakness  F84.0 (ICD-10-CM) - Autism  R13.10 (ICD-10-CM) - Dysphagia, unspecified type  F84.5 (ICD-10-CM) - Asperger syndrome    THERAPY DIAG:  Muscle weakness (generalized)  MS (multiple sclerosis) (HCC)  Unsteadiness on feet  Other abnormalities of gait and mobility  Rationale for Evaluation and Treatment: Rehabilitation  SUBJECTIVE:  SUBJECTIVE STATEMENT: Pt reports that he is rally tired because he worked out right before he came in today. I am also taking caffeine  (green tea) for last 3 days and caffeine  makes me tired. I did some rows, biceps curls and triceps push downs and shoulder press in the gym today.   Pt accompanied by: self  PERTINENT HISTORY: Autism, MS, Bipolar 1, HTN, head injury 2020   PAIN:  Are you having pain? No  PRECAUTIONS: None  RED FLAGS: None   WEIGHT BEARING RESTRICTIONS: No  FALLS: Has patient fallen in last 6 months? No  LIVING ENVIRONMENT: Lives with: lives alone Lives in: House/apartment Stairs: No, has couple of steps in the front but uses ramp mostly Has following equipment at home: Ramped entry  PLOF: Independent  PATIENT GOALS: Strength of R LE; going up and down 3 steps, walking better without compensating  OBJECTIVE:  Note: Objective measures were completed at Evaluation unless otherwise noted.  DIAGNOSTIC FINDINGS: no recent  imaging  COGNITION: Overall cognitive status: Within functional limits for tasks assessed   SENSATION: Light touch: Impaired R foot   LOWER EXTREMITY ROM:     Active  Right Eval Left Eval  Hip flexion    Hip extension    Hip abduction    Hip adduction    Hip internal rotation    Hip external rotation    Knee flexion    Knee extension    Ankle dorsiflexion 5 20  Ankle plantarflexion    Ankle inversion    Ankle eversion     (Blank rows = not tested)  LOWER EXTREMITY MMT:    MMT Right Eval Left Eval  Hip flexion 5 5  Hip extension    Hip abduction    Hip adduction    Hip internal rotation    Hip external rotation    Knee flexion 3+ 5  Knee extension 5 5  Ankle dorsiflexion 3+ 5  Ankle plantarflexion 3+ 5  Ankle inversion    Ankle eversion    (Blank rows = not tested)  GAIT: Gait pattern: decreased arm swing- Right, decreased step length- Right, decreased stance time- Right, decreased stride length, decreased hip/knee flexion- Right, decreased ankle dorsiflexion- Right, Right foot flat, decreased trunk rotation, and poor foot clearance- Right Distance walked: 77' during the session Assistive device utilized: None Level of assistance: Complete Independence    PATIENT SURVEYS:  Patient Specific Functional Scale: (eval) Activity: Walking = 5; going up/down stairs (3-4 steps) = 3; getting up/down from the floor = 4. Average 4.  Patient Specific Functional Scale: (12/04/23) Activity: Walking = 7; going up/down stairs (3-4 steps) = 3; getting up/down from the floor = 4. Average 4.67                                                                                                                               TREATMENT DATE:   Pt educated to split his workouts  by doing Upper body for one session and lower body during his 2nd session during the day. Pt reported that he prefers to do machines that he can access to without doing UE and LE. Pt reports his current  methodology is working best for him.   Outdoor level and unlevel surface gait training with CGA with st. Point cane. Pt ambulated 1100' total including mild incline and declines on level outdoor surfaces, and also practiced going up the ramp 1x  Floor transfers: stand to long sitting and back to standing with use of support surface near by. Performed 1x Pt educated on crawling to support surface to get up off the floor if he falls at home or decides to get down to the floor for any activities.  We reviewed some floor stretches that patient likes to perform including hip adductor stretches, long sitting hamstring stretches, supine knee to chest and piriformis stretches   PATIENT EDUCATION: Education details: see above Person educated: Patient Education method: Explanation Education comprehension: verbalized understanding  HOME EXERCISE PROGRAM: Access Code: [email or web address] URL: [email or web address] Date: 10/09/2023 Prepared by: Susanna Epley  Exercises - Seated Toe Raise  - 1 x daily - 7 x weekly - 2 sets - 10 reps - Seated Heel Raise  - 1 x daily - 7 x weekly - 2 sets - 10 reps - Seated Long Arc Quad  - 1 x daily - 7 x weekly - 2 sets - 10 reps - Seated March  - 1 x daily - 7 x weekly - 2 sets - 10 reps   GOALS: Goals reviewed with patient? Yes  SHORT TERM GOALS: Target date: 01/29/2024      Patient will demo understanding of HEP and will report 100% compliance with HEP for self management. Baseline: Initiated 10/09/23 Goal status: goal met   LONG TERM GOALS: Target date: 12/04/2023  Patient will demo 2 points improvement on PSFS to improve overall function.  Baseline: PSFS = 4 (10/09/23); PSFS = 4.7 points (12/04/23) Goal status: progressing continue   ASSESSMENT:  CLINICAL IMPRESSION: Tolerated session well. Pt seems to be using st. Cane primarily for overall mobility needs. He did have small LOB in the beginning of the session today due to overall fatigue in R LE  but was able to recover with any external support besides his cane.   OBJECTIVE IMPAIRMENTS: Abnormal gait, decreased activity tolerance, decreased balance, decreased coordination, decreased endurance, difficulty walking, decreased ROM, decreased strength, impaired flexibility, impaired tone, impaired UE functional use, postural dysfunction, and pain.   ACTIVITY LIMITATIONS: carrying, lifting, standing, squatting, stairs, and transfers  PARTICIPATION LIMITATIONS: meal prep, cleaning, laundry, driving, shopping, and community activity  PERSONAL FACTORS: Time since onset of injury/illness/exacerbation are also affecting patient's functional outcome.   REHAB POTENTIAL: Good  CLINICAL DECISION MAKING: Stable/uncomplicated  EVALUATION COMPLEXITY: Low  PLAN:  PT FREQUENCY: 1x/week  PT DURATION: 8 weeks  PLANNED INTERVENTIONS: 97164- PT Re-evaluation, 97110-Therapeutic exercises, 97530- Therapeutic activity, 97112- Neuromuscular re-education, 97535- Self Care, 53664- Manual therapy, [phone]- Gait training, [phone]- Orthotic Fit/training, Patient/Family education, Balance training, Stair training, Cognitive remediation, and Moist heat  PLAN FOR NEXT SESSION: Focus on following: Strength (dorsiflexors, hamstrings, hip flexors), gait without excessive R UE compensation, stairs, floor transfers.   Kristine Phalen, PT 12/18/2023, 12:16 PM

## 2023-12-19 DIAGNOSIS — Z79899 Other long term (current) drug therapy: Secondary | ICD-10-CM | POA: Diagnosis not present

## 2023-12-19 DIAGNOSIS — I10 Essential (primary) hypertension: Secondary | ICD-10-CM | POA: Diagnosis not present

## 2023-12-19 DIAGNOSIS — R1310 Dysphagia, unspecified: Secondary | ICD-10-CM | POA: Diagnosis not present

## 2023-12-19 DIAGNOSIS — F845 Asperger's syndrome: Secondary | ICD-10-CM | POA: Diagnosis not present

## 2023-12-19 DIAGNOSIS — Z0000 Encounter for general adult medical examination without abnormal findings: Secondary | ICD-10-CM | POA: Diagnosis not present

## 2023-12-19 DIAGNOSIS — G35 Multiple sclerosis: Secondary | ICD-10-CM | POA: Diagnosis not present

## 2023-12-19 DIAGNOSIS — F319 Bipolar disorder, unspecified: Secondary | ICD-10-CM | POA: Diagnosis not present

## 2023-12-19 DIAGNOSIS — E785 Hyperlipidemia, unspecified: Secondary | ICD-10-CM | POA: Diagnosis not present

## 2023-12-19 DIAGNOSIS — E291 Testicular hypofunction: Secondary | ICD-10-CM | POA: Diagnosis not present

## 2023-12-19 DIAGNOSIS — Z1211 Encounter for screening for malignant neoplasm of colon: Secondary | ICD-10-CM | POA: Diagnosis not present

## 2023-12-19 DIAGNOSIS — Z125 Encounter for screening for malignant neoplasm of prostate: Secondary | ICD-10-CM | POA: Diagnosis not present

## 2023-12-19 DIAGNOSIS — E039 Hypothyroidism, unspecified: Secondary | ICD-10-CM | POA: Diagnosis not present

## 2023-12-22 DIAGNOSIS — E039 Hypothyroidism, unspecified: Secondary | ICD-10-CM | POA: Diagnosis not present

## 2023-12-22 DIAGNOSIS — Z125 Encounter for screening for malignant neoplasm of prostate: Secondary | ICD-10-CM | POA: Diagnosis not present

## 2023-12-22 DIAGNOSIS — Z79899 Other long term (current) drug therapy: Secondary | ICD-10-CM | POA: Diagnosis not present

## 2023-12-22 DIAGNOSIS — E785 Hyperlipidemia, unspecified: Secondary | ICD-10-CM | POA: Diagnosis not present

## 2023-12-24 DIAGNOSIS — E291 Testicular hypofunction: Secondary | ICD-10-CM | POA: Diagnosis not present

## 2023-12-25 ENCOUNTER — Other Ambulatory Visit: Payer: Self-pay | Admitting: Neurology

## 2023-12-25 ENCOUNTER — Ambulatory Visit

## 2023-12-25 NOTE — Telephone Encounter (Signed)
 Last seen on 07/07/23 Follow up scheduled on 01/19/24

## 2023-12-31 ENCOUNTER — Other Ambulatory Visit: Payer: Self-pay | Admitting: Neurology

## 2023-12-31 DIAGNOSIS — R9089 Other abnormal findings on diagnostic imaging of central nervous system: Secondary | ICD-10-CM

## 2023-12-31 DIAGNOSIS — T561X1A Toxic effect of mercury and its compounds, accidental (unintentional), initial encounter: Secondary | ICD-10-CM

## 2023-12-31 NOTE — Telephone Encounter (Signed)
 Patient would like to proceed with ordering test.

## 2024-01-01 ENCOUNTER — Other Ambulatory Visit (INDEPENDENT_AMBULATORY_CARE_PROVIDER_SITE_OTHER): Payer: Self-pay

## 2024-01-01 ENCOUNTER — Ambulatory Visit

## 2024-01-01 DIAGNOSIS — M6281 Muscle weakness (generalized): Secondary | ICD-10-CM

## 2024-01-01 DIAGNOSIS — T561X1A Toxic effect of mercury and its compounds, accidental (unintentional), initial encounter: Secondary | ICD-10-CM

## 2024-01-01 DIAGNOSIS — R9089 Other abnormal findings on diagnostic imaging of central nervous system: Secondary | ICD-10-CM

## 2024-01-01 DIAGNOSIS — R2689 Other abnormalities of gait and mobility: Secondary | ICD-10-CM

## 2024-01-01 DIAGNOSIS — Z0289 Encounter for other administrative examinations: Secondary | ICD-10-CM

## 2024-01-01 DIAGNOSIS — R2681 Unsteadiness on feet: Secondary | ICD-10-CM

## 2024-01-01 DIAGNOSIS — G35 Multiple sclerosis: Secondary | ICD-10-CM

## 2024-01-01 NOTE — Therapy (Signed)
 OUTPATIENT PHYSICAL THERAPY NEURO TREATMENT NOTE  Patient Name: George [name] MRN: [id number] DOB:[birth year], 53 y.o., male Today's Date: 01/01/2024   PCP: Dr. Charlene Conners REFERRING PROVIDER: Jorie Newness, MD   END OF SESSION:   PT End of Session - 01/01/24 1137     Visit Number 13    Number of Visits 18    Date for PT Re-Evaluation 01/29/24    Authorization Type [employer]    PT Start Time 1100    Equipment Utilized During Treatment Gait belt    Activity Tolerance Patient tolerated treatment well;Patient limited by fatigue    Behavior During Therapy WFL for tasks assessed/performed                Past Medical History:  Diagnosis Date   Anxiety    Asperger's syndrome    Autistic spectrum disorder    Asperger's (per H&P Olivia Clelland, PA)   Bipolar 1 disorder (HCC)    "no mood swings in 20 years"  on medication   GERD (gastroesophageal reflux disease)    Head injury 2020   Hypertension    Hypothyroidism    IBS (irritable bowel syndrome)    Past Surgical History:  Procedure Laterality Date   NO PAST SURGERIES     RADIOLOGY WITH ANESTHESIA N/A 05/16/2020   Procedure: MRI BRAIN WITH AND WITHOUT CONTRAST;  Surgeon: Radiologist, Medication, MD;  [facility];  Service: Radiology;  Laterality: N/A;   RADIOLOGY WITH ANESTHESIA N/A 08/29/2020   Procedure: MRI WITH ANESTHESIA CERVICAL SPINE WITH AND WITHOUT CONTRAST, THORACIC SPINE WITH AND WITHOUT CONTRAST;  Surgeon: Radiologist, Medication, MD;  Location: [facility];  Service: Radiology;  Laterality: N/A;   RADIOLOGY WITH ANESTHESIA N/A 03/15/2021   Procedure: MRI BRAIN WITH AND WITHOUT, CERIVAL WITH AND WITHOUT;  Surgeon: Radiologist, Medication, MD;  [facility]: [facility];  Service: Radiology;  Laterality: N/A;   Patient Active Problem List   Diagnosis Date Noted   Depression 01/01/2023   Bipolar disease, chronic (HCC) 06/20/2021   Slurred speech 02/26/2021   Autism 12/20/2020   Multiple  sclerosis (HCC) 10/11/2020   MRI of brain abnormal 10/11/2020   Right sided weakness 10/11/2020   Gait disturbance 10/11/2020   Asperger syndrome 03/19/2013    ONSET DATE: 09/25/23 date of referral  REFERRING DIAG:  G35 (ICD-10-CM) - Multiple sclerosis (HCC)  R53.1 (ICD-10-CM) - Right sided weakness  R26.9 (ICD-10-CM) - Gait disturbance  R53.1 (ICD-10-CM) - Weakness  F84.0 (ICD-10-CM) - Autism  R13.10 (ICD-10-CM) - Dysphagia, unspecified type  F84.5 (ICD-10-CM) - Asperger syndrome    THERAPY DIAG:  Muscle weakness (generalized)  MS (multiple sclerosis) (HCC)  Unsteadiness on feet  Other abnormalities of gait and mobility  Rationale for Evaluation and Treatment: Rehabilitation  SUBJECTIVE:  SUBJECTIVE STATEMENT: Pt reports he fell at supermarket today. He was in electric cart and got up to get something (without cane) and his left foot got stuck on the floor and fell backwards on his cart. Pt denies any falls or near falls at home. Pt came in today without his cane. "I forgot my cane in the car". Pt denies any injuries as of right now.   Pt accompanied by: self  PERTINENT HISTORY: Autism, MS, Bipolar 1, HTN, head injury 2020   PAIN:  Are you having pain? No  PRECAUTIONS: None  RED FLAGS: None   WEIGHT BEARING RESTRICTIONS: No  FALLS: Has patient fallen in last 6 months? No  LIVING ENVIRONMENT: Lives with: lives alone Lives in: House/apartment Stairs: No, has couple of steps in the front but uses ramp mostly Has following equipment at home: Ramped entry  PLOF: Independent  PATIENT GOALS: Strength of R LE; going up and down 3 steps, walking better without compensating  OBJECTIVE:  Note: Objective measures were completed at Evaluation unless otherwise noted.  DIAGNOSTIC  FINDINGS: no recent imaging  COGNITION: Overall cognitive status: Within functional limits for tasks assessed   SENSATION: Light touch: Impaired R foot   LOWER EXTREMITY ROM:     Active  Right Eval Left Eval  Hip flexion    Hip extension    Hip abduction    Hip adduction    Hip internal rotation    Hip external rotation    Knee flexion    Knee extension    Ankle dorsiflexion 5 20  Ankle plantarflexion    Ankle inversion    Ankle eversion     (Blank rows = not tested)  LOWER EXTREMITY MMT:    MMT Right Eval Left Eval Right 01/01/24  Hip flexion 5 5   Hip extension     Hip abduction     Hip adduction     Hip internal rotation     Hip external rotation     Knee flexion 3+ 5 4-/4  Knee extension 5 5 5   Ankle dorsiflexion 3+ 5 4  Ankle plantarflexion 3+ 5 4+/5  Ankle inversion     Ankle eversion     (Blank rows = not tested)  GAIT: Gait pattern: decreased arm swing- Right, decreased step length- Right, decreased stance time- Right, decreased stride length, decreased hip/knee flexion- Right, decreased ankle dorsiflexion- Right, Right foot flat, decreased trunk rotation, and poor foot clearance- Right Distance walked: 20' during the session Assistive device utilized: None Level of assistance: Complete Independence    PATIENT SURVEYS:  Patient Specific Functional Scale: (eval) Activity: Walking = 5; going up/down stairs (3-4 steps) = 3; getting up/down from the floor = 4. Average 4.  Patient Specific Functional Scale: (12/04/23) Activity: Walking = 7; going up/down stairs (3-4 steps) = 3; getting up/down from the floor = 4. Average 4.67 (using walker)  01/01/24: Activity: Walking = 6; going up/down stairs (3-4 steps) = 7; getting up/down from the floor = 8. Average 7 (using cane)- decrease score with walking as pt is using cane now instead of walker when this was asked on 12/04/23.  TREATMENT DATE:   Reassessment performed today Patient educated on following: - PT preference and recommendation is that pt to use rolator primarily for his in home and outdoor mobility to improve walking endurance and reduce risk for fall. Pt reports that getting the walk in and out of the car uses too much of his energy and he is too tired to do anything else afterwards. Pt was educated that more he uses walker and more he gets in and out of the car, more efficient he will become as he develops motor memory and easier and less energy he will expand   - Pt educated that at very minimum he should use cane for in home and community mobility for safety and improve walking endurance.  - pt asked for resources on nutrition counseling so resources were printed for patient for [facility] Nutrition counseling.  - Continued encouragement provided for compliance with going to gym and gradually progressing resistance as tolerated. We discussed several exercises he performs and recommendations were provided accordingly. Avoid painfree ranges of motion and gradually progressing weight.  - Overall progress was discussed with patient discharge planning discussed.  PATIENT EDUCATION: Education details: see above Person educated: Patient Education method: Explanation Education comprehension: verbalized understanding  HOME EXERCISE PROGRAM: Access Code: [email or web address] URL: [email or web address] Date: 10/09/2023 Prepared by: Susanna Epley  Exercises - Seated Toe Raise  - 1 x daily - 7 x weekly - 2 sets - 10 reps - Seated Heel Raise  - 1 x daily - 7 x weekly - 2 sets - 10 reps - Seated Long Arc Quad  - 1 x daily - 7 x weekly - 2 sets - 10 reps - Seated March  - 1 x daily - 7 x weekly - 2 sets - 10 reps   GOALS: Goals reviewed with patient? Yes  SHORT TERM GOALS: Target date: 01/29/2024      Patient will demo understanding of HEP and will report  100% compliance with HEP for self management. Baseline: Initiated 10/09/23 Goal status: goal met   LONG TERM GOALS: Target date: 12/04/2023  Patient will demo 2 points improvement on PSFS to improve overall function.  Baseline: PSFS = 4 (10/09/23); PSFS = 4.7 points (12/04/23) Goal status: progressing continue   ASSESSMENT:  CLINICAL IMPRESSION: Pt has been seen for total of 13 sessions for gait and mobility impairments due to MS. Patient has progressed well and demonstrated significant improvement in his MMT and overall function (per patient specific functional scale). Patient has demonstrated improved compliance with using cane (still inconsistent). PT recommended pt to use rolator primarily as his gait quality and endurance is much better when using Rolator but patient prefers to use cane or no AD. At this point, patient has reached his maximum potential from skilled PT and will be discharged from PT with independent self management of his symptoms.   OBJECTIVE IMPAIRMENTS: Abnormal gait, decreased activity tolerance, decreased balance, decreased coordination, decreased endurance, difficulty walking, decreased ROM, decreased strength, impaired flexibility, impaired tone, impaired UE functional use, postural dysfunction, and pain.   ACTIVITY LIMITATIONS: carrying, lifting, standing, squatting, stairs, and transfers  PARTICIPATION LIMITATIONS: meal prep, cleaning, laundry, driving, shopping, and community activity  PERSONAL FACTORS: Time since onset of injury/illness/exacerbation are also affecting patient's functional outcome.   REHAB POTENTIAL: Good  CLINICAL DECISION MAKING: Stable/uncomplicated  EVALUATION COMPLEXITY: Low  PLAN: discharge from skilled PT    Kristine Phalen, PT 01/01/2024, 12:28 PM

## 2024-01-06 ENCOUNTER — Ambulatory Visit: Payer: Self-pay | Admitting: Neurology

## 2024-01-06 LAB — HEAVY METALS, BLOOD
Arsenic: 3 ug/L (ref 0–9)
Lead, Blood: <1 ug/dL (ref 0.0–3.4)
Mercury: 12.2 ug/L (ref 0.0–14.9)

## 2024-01-07 DIAGNOSIS — E291 Testicular hypofunction: Secondary | ICD-10-CM | POA: Diagnosis not present

## 2024-01-11 ENCOUNTER — Encounter: Payer: Self-pay | Admitting: Neurology

## 2024-01-16 ENCOUNTER — Encounter: Payer: Self-pay | Admitting: Neurology

## 2024-01-19 ENCOUNTER — Encounter: Payer: Self-pay | Admitting: Neurology

## 2024-01-19 ENCOUNTER — Ambulatory Visit (INDEPENDENT_AMBULATORY_CARE_PROVIDER_SITE_OTHER): Payer: PPO | Admitting: Neurology

## 2024-01-19 VITALS — BP 134/79 | HR 71 | Ht 68.0 in | Wt 156.0 lb

## 2024-01-19 DIAGNOSIS — F845 Asperger's syndrome: Secondary | ICD-10-CM | POA: Diagnosis not present

## 2024-01-19 DIAGNOSIS — R531 Weakness: Secondary | ICD-10-CM | POA: Diagnosis not present

## 2024-01-19 DIAGNOSIS — F32A Depression, unspecified: Secondary | ICD-10-CM

## 2024-01-19 DIAGNOSIS — F319 Bipolar disorder, unspecified: Secondary | ICD-10-CM

## 2024-01-19 DIAGNOSIS — G35 Multiple sclerosis: Secondary | ICD-10-CM

## 2024-01-19 DIAGNOSIS — R269 Unspecified abnormalities of gait and mobility: Secondary | ICD-10-CM

## 2024-01-19 NOTE — Telephone Encounter (Signed)
 Pt scheduled for OV today at 11:00am

## 2024-01-19 NOTE — Progress Notes (Signed)
 [facility]  PATIENT: George [name] DOB: 10-Jan-1971  REFERRING DOCTOR OR PCP: PCP is George [name].  Patient has seen Dr. Salli [name] SOURCE: Patient, mother, notes from Dr. Salli [name], MRI imaging reports and lab reports.  MRI images personally reviewed  _________________________________   HISTORICAL  CHIEF COMPLAINT:  Chief Complaint  Patient presents with   Follow-up    Pt in room 10. Alone. Here for MS follow up. On Copaxone  doing well on this. No falls, pt would like PT referral for 1 day to help learn exercise. Pt would also like  Nutritionist consult also. Pt has IBS to help with eating foods. Last eye exam was 2-3 months ago.    HISTORY OF PRESENT ILLNESS:  George [name] is a 53 y.o. man with relapsing remitting multiple sclerosis.  Update 01/19/2024:: He feels mostly stable.   He feels his balance and core strength are doing better.   He goes to the gym 6 days a week.    He notes mild progression of gait issues and tries to avoid using the walker.   He was concerned about mercury exposure (eats a lot of albacore tuna).  He has cut back.  Heavy metal screen was negative.      He is trying to exercise twice a day and asked about a referral to PT and would like help to design a home exercise program.     He is on glatiramer  and tolerates it well.    No exacerbation  .  George [name]  Lately has had more of a SPMS course  and we have discussed the BTKi in trials.    He notes gait and balance are mildly off but no worse than last visit.    He has no falls.   He uses the bannister on stairs.   Last MRI we did 03/15/2021 showed no new lesions.   He also had MRI brain and cervical spine at [facility] showing no acute changes.    He has lesions at C6 (right) and T10-T11.   T2 hyperintense lesions are seen also seen involving the right thalamus, midbrain, pons and medulla as well as bilateral middle cerebellar peduncles.   MRI of the thracic spine 02/18/2023 showed a normal spinal  cord.   Since last visit, he has done some physical and speech therapy.  Swallow evaluation was ordered due to choking on food and drink.  He feels that this is actually better now than it was earlier in the year.  The swallow study was normal.  His gait is mildly unbalanced .   He stumbles but has no falls over the last few weeks.  He has trouble on stairs.  Sometimes the right foot will drag when he walks.  He dhas mild right hand numbness and clumsiness.   Vision is fine.  Bladder function is usually good though he has some urgency with use of caffeine .  No ED.  He is concerned about a tremor.  I did not note a tremor on today's visit.  He was worried about Parkinson's disease.  He sees speech therapy for swallow function.   The states they feel swallow issues are more psychologic than due to MS.    He is active and tries to exercise daily.    He has some fatigue but no more than last year.     He is sleeping well most nights.    He has bipolar disease and is currently on Depakote.   He has mood  swings and is quick to get angry.     He is on Buspar.  Wellbutrin caused constipation.   SSRI's were poorly tolerated.   .  The medications have helped      He takes Vit D and B12 supplements.    MS HISTORY: He has a h/o Asperger syndrome and bipolar disease.   In 2012, he had an MRI of the brain and was found to have multiple T2/FLAIR hyperintense foci in the brain.   A lumbar puncture at the time was negative.    Over the last year, he began to have more difficulty with right hand coordination and left foot drop.    Also the right side of his face was drooping some.     He came in to see Dr. Salli [name] and had repeat imaging studies showing progression of the white matter foci and the appearance of a small focus at C5.    Repeat LP, however, showed negative CSF (no OCB) again.   He started glatiramer  10/2020.  We had discussed other med's as well.     OTHER MEDICAL HISTORY He has bipolar disease (sees  Dr. Deborra [name]).  He also has autism.   He is on Depakote.   He notes being more emotional and easily gets overwhelmed with emotions.   This had happened when he was younger but then got better again.      He has irritable bowel syndrome and takes B12 since he was told absorption may be affected.   B12 level was fine.     Imaging studies reviewed: MRI of the brain 11/21/2010 shows multiple T2/FLAIR hyperintense foci including a focus in the left pons, middle cerebellar peduncle, right thalamus.  Some foci of periventricular and some foci are juxtacortical.  None of the foci enhanced.  MRI of the brain 05/16/2020 showed a similar pattern at the 2012 MRI but there has been mild progression with at least 2 new lesions noted in the white matter compared to the previous MRI.  As seen before there is a large focus involving the left greater than right pons.  It may be slightly larger.  There is also involvement of the middle cerebellar peduncle which is stable on the right but a new focus on the left.  There is one stable focus in the right thalamus.  MRI of the cervical spine 08/29/2020 showed a small focus posteriorly to the right adjacent to C5.  Mild DJD at C5-C6 not causing nerve root compression.  MRI of the thoracic spine 08/29/2020 showed a normal spinal cord (subtle focus at T10-T11 more likely artifact).  There are cystic lesions within the kidneys  MRI of the brain 03/15/2021 and it was unchanged.   No new foci.  MRI of the cervical spine 03/15/2021 showed a focus to the right at [facility].  MRIs from [facility] November 2023 and compared them to the MRIs from 2022.  There did not appear to be any new activity.  MRI of the thoracic spine 02/18/2023 showed a normal spinal cord.    MRI of the brain 07/24/2023.    Laboratory results. ANA, ANCA, HIV, hep B antibodies, hep C antibody were all negative or normal.  Vitamin D was mildly low.  Vitamin B12 was fine (elevated), creatinine was mildly elevated.  JCV  antibody was positive at 1.3.  There were no oligoclonal bands in the CSF.  Protein was mildly elevated at 54.  CSF 09/13/2020 did NOT show OCB  REVIEW OF SYSTEMS: Constitutional:  No fevers, chills, sweats, or change in appetite Eyes: No visual changes, double vision, eye pain Ear, nose and throat: No hearing loss, ear pain, nasal congestion, sore throat Cardiovascular: No chest pain, palpitations Respiratory:  No shortness of breath at rest or with exertion.   No wheezes GastrointestinaI: No nausea, vomiting, diarrhea, abdominal pain, fecal incontinence Genitourinary:  No dysuria, urinary retention or frequency.  No nocturia. Musculoskeletal:  No neck pain, back pain Integumentary: No rash, pruritus, skin lesions Neurological: as above Psychiatric: Notes some depression and anxiety at this time.  Diagnosed with Bipolar in past.   Endocrine: No palpitations, diaphoresis, change in appetite, change in weigh or increased thirst Hematologic/Lymphatic:  No anemia, purpura, petechiae. Allergic/Immunologic: No itchy/runny eyes, nasal congestion, recent allergic reactions, rashes  ALLERGIES: Allergies  Allergen Reactions   Other Other (See Comments)    Can only eat at home    HOME MEDICATIONS:  Current Outpatient Medications:    amLODipine-valsartan (EXFORGE) 5-320 MG tablet, Take 1 tablet by mouth daily., Disp: , Rfl:    busPIRone (BUSPAR) 10 MG tablet, Take 20 mg by mouth 2 (two) times daily., Disp: , Rfl:    Cholecalciferol (VITAMIN D3) 50 MCG (2000 UT) TABS, Take 2,000 Units by mouth in the morning., Disp: , Rfl:    COPAXONE  40 MG/ML SOSY, Inject 40mg  subcutaneously three (3) times per week, Disp: 12 mL, Rfl: 0   cyclobenzaprine  (FLEXERIL ) 10 MG tablet, Take 1 tablet (10 mg total) by mouth 2 (two) times daily as needed for muscle spasms., Disp: 20 tablet, Rfl: 0   diazepam  (VALIUM ) 5 MG tablet, Take one or two before MRi, Disp: 2 tablet, Rfl: 0   divalproex (DEPAKOTE) 250 MG DR tablet,  Take 1,500 mg by mouth daily., Disp: , Rfl:    SYNTHROID 100 MCG tablet, Take 100 mcg by mouth daily before breakfast. , Disp: , Rfl:    testosterone  enanthate (DELATESTRYL) 200 MG/ML injection, Inject 150 mg into the muscle every 14 (fourteen) days. For IM use only  Every three weeks, Disp: , Rfl:   PAST MEDICAL HISTORY: Past Medical History:  Diagnosis Date   Anxiety    Asperger's syndrome    Autistic spectrum disorder    Asperger's (per H&P Olivia Clelland, PA)   Bipolar 1 disorder (HCC)    no mood swings in 20 years  on medication   GERD (gastroesophageal reflux disease)    Head injury 2020   Hypertension    Hypothyroidism    IBS (irritable bowel syndrome)     PAST SURGICAL HISTORY: Past Surgical History:  Procedure Laterality Date   NO PAST SURGERIES     RADIOLOGY WITH ANESTHESIA N/A 05/16/2020   Procedure: MRI BRAIN WITH AND WITHOUT CONTRAST;  Surgeon: Radiologist, Medication, MD;  Location: [facility];  Service: Radiology;  Laterality: N/A;   RADIOLOGY WITH ANESTHESIA N/A 08/29/2020   Procedure: MRI WITH ANESTHESIA CERVICAL SPINE WITH AND WITHOUT CONTRAST, THORACIC SPINE WITH AND WITHOUT CONTRAST;  Surgeon: Radiologist, Medication, MD;  Location: [facility];  Service: Radiology;  Laterality: N/A;   RADIOLOGY WITH ANESTHESIA N/A 03/15/2021   Procedure: MRI BRAIN WITH AND WITHOUT, CERIVAL WITH AND WITHOUT;  Surgeon: Radiologist, Medication, MD;  Location: [facility];  Service: Radiology;  Laterality: N/A;    FAMILY HISTORY: Family History  Problem Relation Age of Onset   Hypertension Father    Depression Sister    Hypertension Paternal Grandfather     SOCIAL HISTORY:  Social History   Socioeconomic History  Marital status: Single    Spouse name: Not on file   Number of children: 0   Years of education: college   Highest education level: Not on file  Occupational History    Comment: artist  Tobacco Use   Smoking status: Never   Smokeless tobacco: Never  Vaping Use    Vaping status: Never Used  Substance and Sexual Activity   Alcohol use: No   Drug use: No   Sexual activity: Not on file  Other Topics Concern   Not on file  Social History Narrative   He lives at home.    He is single and has no children.     He denies use of tobacco, alcohol, illicit drugs and caffeine .    He works as an Tree surgeon.    Teacher, early years/pre Strain: Not on [employer] Insecurity: Not on file  Transportation Needs: Not on file  Physical Activity: Not on file  Stress: Not on file  Social Connections: Unknown (12/16/2021)   Received from [employer]   Social Network    Social Network: Not on file  Intimate Partner Violence: Unknown (11/07/2021)   Received from [facility]   HITS    Physically Hurt: Not on file    Insult or Talk Down To: Not on file    Threaten Physical Harm: Not on file    Scream or Curse: Not on file     PHYSICAL EXAM  Vitals:   01/19/24 1043  BP: 134/79  Pulse: 71  Weight: 156 lb (70.8 kg)  Height: 5' 8 (1.727 m)    Body mass index is 23.72 kg/m.   General: The patient is well-developed and well-nourished and in no acute distress  HEENT:  Head is [place]/AT.  Sclera are anicteric.   Neck:  The neck is nontender.  Skin: Extremities are without rash or  edema.   Neurologic Exam  Mental status: The patient is alert and oriented x 3 at the time of the examination. The patient has apparent normal recent and remote memory, with an apparently normal attention span and concentration ability.   Speech is normal.  Cranial nerves: Extraocular movements are full.   There is mild weakness in the right lower face  Facial sensation is normal.     Trapezius and sternocleidomastoid strength is normal. No dysarthria is noted.    No obvious hearing deficits are noted.  Motor: I did not note a tremor.  Muscle bulk is normal.   Tone is mildly increased on the right. Strength is  5 / 5 in left side, 4/5 right triceps and  intrinsic hand muscles, 4++ biceps and deltoid,   4+/5 right  leg except 4/5 iliopsoas and ankle/toe extensors.    Reduced RAM in right arm and foot  Sensory: Sensory testing is intact to pinprick, soft touch and vibration sensation in arms and slightly reduced vibraiton in right leg.  Coordination: Cerebellar testing reveals mildly reduced coordination right hand and foot.   Reduced heel-to-shin.  Gait and station: Station is normal.   He has a right foot drop.  T tandem gait is wide.  Mberg is negative.   Reflexes: Deep tendon reflexes are symmetric and normal in arms and increased right leg.        DIAGNOSTIC DATA (LABS, IMAGING, TESTING) - I reviewed patient records, labs, notes, testing and imaging myself where available.  Lab Results  Component Value Date   WBC 7.1 09/11/2020  HGB 17.4 09/11/2020   HCT 52.4 (H) 09/11/2020   MCV 85 09/11/2020   PLT 206 09/11/2020      Component Value Date/Time   NA 138 03/15/2021 0934   NA 142 09/11/2020 1035   K 4.2 03/15/2021 0934   CL 104 03/15/2021 0934   CO2 26 03/15/2021 0934   GLUCOSE 87 03/15/2021 0934   BUN 14 03/15/2021 0934   BUN 16 09/11/2020 1035   CREATININE 1.22 03/15/2021 0934   CREATININE 1.14 03/04/2014 1310   CALCIUM 9.2 03/15/2021 0934   PROT 7.3 09/11/2020 1035   ALBUMIN 4.6 09/11/2020 1035   AST 16 09/11/2020 1035   ALT 20 09/11/2020 1035   ALKPHOS 67 09/11/2020 1035   BILITOT 0.3 09/11/2020 1035   GFRNONAA >60 03/15/2021 0934   GFRAA 72 09/11/2020 1035   No results found for: CHOL, HDL, LDLCALC, LDLDIRECT, TRIG, CHOLHDL Lab Results  Component Value Date   HGBA1C 5.4 09/11/2020   Lab Results  Component Value Date   VITAMINB12 >2000 (H) 09/11/2020   Lab Results  Component Value Date   TSH 1.400 09/11/2020       ASSESSMENT AND PLAN  Multiple sclerosis (HCC)  Right sided weakness  Asperger syndrome  Bipolar disease, chronic (HCC)  Depression, unspecified depression  type  Gait disturbance   For now he will continue glatiramer  .  We went over natural history of MS going from a more inflammatory disease with relapses and MRI changes to a degenerative disease with progressive impairments in the absence of new MRI activity.    We discussed the BTKi's and that a new medicine (tolebrutinib) may be out in 2026.    3.   Stay active and exercie as tolerated. 4.  Continue vit D supplements a mercury blood test was fine but I did advise him to try to cut back on the albacore tuna as that can be high in mercury. 5.   Return to see us  in 6 months or sooner for new or worsening neurologic symptoms.        This visit is part of a comprehensive longitudinal care medical relationship regarding the patients primary diagnosis of multiple sclerosis and related concerns.      Alizandra Loh A. Godwin Lat, MD, [facility] 01/19/2024, 11:21 AM Certified in Neurology, Clinical Neurophysiology, Sleep Medicine and Neuroimaging  [facility] [address] [place], [place] [address] [phone]

## 2024-01-21 DIAGNOSIS — E291 Testicular hypofunction: Secondary | ICD-10-CM | POA: Diagnosis not present

## 2024-02-02 ENCOUNTER — Telehealth: Payer: Self-pay | Admitting: Neurology

## 2024-02-02 ENCOUNTER — Encounter: Payer: Self-pay | Admitting: Neurology

## 2024-02-02 DIAGNOSIS — K588 Other irritable bowel syndrome: Secondary | ICD-10-CM

## 2024-02-02 DIAGNOSIS — R531 Weakness: Secondary | ICD-10-CM

## 2024-02-02 DIAGNOSIS — G35 Multiple sclerosis: Secondary | ICD-10-CM

## 2024-02-02 NOTE — Telephone Encounter (Signed)
Referral for physical therapy sent through [facility] to [facility]. Phone: [phone].

## 2024-02-02 NOTE — Telephone Encounter (Signed)
 Referral for Nutrition and Diabetic Education sent through [facility] to  [facility].  Phone: [phone], Fax: [phone]

## 2024-02-02 NOTE — Telephone Encounter (Signed)
 Dr.Sater are you okay with referral for PT/nutritionist?   He was discharged from PT back on 01/01/24, but would like to have another referral.

## 2024-02-04 DIAGNOSIS — E291 Testicular hypofunction: Secondary | ICD-10-CM | POA: Diagnosis not present

## 2024-02-16 ENCOUNTER — Encounter: Payer: Self-pay | Admitting: Neurology

## 2024-02-18 DIAGNOSIS — E291 Testicular hypofunction: Secondary | ICD-10-CM | POA: Diagnosis not present

## 2024-02-24 ENCOUNTER — Other Ambulatory Visit: Payer: Self-pay

## 2024-02-24 ENCOUNTER — Other Ambulatory Visit: Payer: Self-pay | Admitting: Neurology

## 2024-03-02 ENCOUNTER — Other Ambulatory Visit: Payer: Self-pay | Admitting: *Deleted

## 2024-03-02 MED ORDER — COPAXONE 40 MG/ML ~~LOC~~ SOSY: PREFILLED_SYRINGE | SUBCUTANEOUS | 6 refills | Status: AC

## 2024-03-02 NOTE — Telephone Encounter (Signed)
 Last seen on 01/19/24 Follow up scheduled 08/18/24

## 2024-03-03 DIAGNOSIS — E291 Testicular hypofunction: Secondary | ICD-10-CM | POA: Diagnosis not present

## 2024-03-04 ENCOUNTER — Encounter: Payer: Self-pay | Admitting: Neurology

## 2024-03-17 DIAGNOSIS — E291 Testicular hypofunction: Secondary | ICD-10-CM | POA: Diagnosis not present

## 2024-03-22 DIAGNOSIS — H5213 Myopia, bilateral: Secondary | ICD-10-CM | POA: Diagnosis not present

## 2024-03-22 DIAGNOSIS — H532 Diplopia: Secondary | ICD-10-CM | POA: Diagnosis not present

## 2024-03-31 DIAGNOSIS — E291 Testicular hypofunction: Secondary | ICD-10-CM | POA: Diagnosis not present

## 2024-04-14 DIAGNOSIS — E291 Testicular hypofunction: Secondary | ICD-10-CM | POA: Diagnosis not present

## 2024-04-28 DIAGNOSIS — I10 Essential (primary) hypertension: Secondary | ICD-10-CM | POA: Diagnosis not present

## 2024-04-28 DIAGNOSIS — E291 Testicular hypofunction: Secondary | ICD-10-CM | POA: Diagnosis not present

## 2024-04-28 DIAGNOSIS — F845 Asperger's syndrome: Secondary | ICD-10-CM | POA: Diagnosis not present

## 2024-04-28 DIAGNOSIS — G35 Multiple sclerosis: Secondary | ICD-10-CM | POA: Diagnosis not present

## 2024-05-03 ENCOUNTER — Encounter: Payer: Self-pay | Admitting: Neurology

## 2024-05-12 DIAGNOSIS — E291 Testicular hypofunction: Secondary | ICD-10-CM | POA: Diagnosis not present

## 2024-05-14 ENCOUNTER — Encounter: Payer: Self-pay | Admitting: Neurology

## 2024-05-17 ENCOUNTER — Encounter: Payer: Self-pay | Admitting: Neurology

## 2024-05-18 ENCOUNTER — Other Ambulatory Visit: Payer: Self-pay | Admitting: Neurology

## 2024-05-18 DIAGNOSIS — R1310 Dysphagia, unspecified: Secondary | ICD-10-CM

## 2024-05-18 DIAGNOSIS — G35D Multiple sclerosis, unspecified: Secondary | ICD-10-CM

## 2024-05-18 DIAGNOSIS — R4781 Slurred speech: Secondary | ICD-10-CM

## 2024-05-26 DIAGNOSIS — E291 Testicular hypofunction: Secondary | ICD-10-CM | POA: Diagnosis not present

## 2024-06-02 NOTE — Telephone Encounter (Signed)
 Pt has called to report he has yet to hear from anyone about being seen about swallowing difficulties.  Also pt is asking for the cost of such an appointment if Medicare does not take care of cost.

## 2024-06-03 ENCOUNTER — Telehealth: Payer: Self-pay | Admitting: Neurology

## 2024-06-03 ENCOUNTER — Other Ambulatory Visit: Payer: Self-pay | Admitting: *Deleted

## 2024-06-03 DIAGNOSIS — G35D Multiple sclerosis, unspecified: Secondary | ICD-10-CM

## 2024-06-03 DIAGNOSIS — R1310 Dysphagia, unspecified: Secondary | ICD-10-CM

## 2024-06-03 DIAGNOSIS — R4781 Slurred speech: Secondary | ICD-10-CM

## 2024-06-03 NOTE — Telephone Encounter (Signed)
 Referral to Speech Therapy  sent thru Epic to  [facility]  [facility] Phone [phone]

## 2024-06-07 DIAGNOSIS — Z130 Encounter for screening for diseases of the blood and blood-forming organs and certain disorders involving the immune mechanism: Secondary | ICD-10-CM | POA: Diagnosis not present

## 2024-06-07 DIAGNOSIS — Z1329 Encounter for screening for other suspected endocrine disorder: Secondary | ICD-10-CM | POA: Diagnosis not present

## 2024-06-07 DIAGNOSIS — Z1341 Encounter for autism screening: Secondary | ICD-10-CM | POA: Diagnosis not present

## 2024-06-09 DIAGNOSIS — E291 Testicular hypofunction: Secondary | ICD-10-CM | POA: Diagnosis not present

## 2024-06-22 DIAGNOSIS — Z79899 Other long term (current) drug therapy: Secondary | ICD-10-CM | POA: Diagnosis not present

## 2024-06-22 DIAGNOSIS — F319 Bipolar disorder, unspecified: Secondary | ICD-10-CM | POA: Diagnosis not present

## 2024-06-22 DIAGNOSIS — E039 Hypothyroidism, unspecified: Secondary | ICD-10-CM | POA: Diagnosis not present

## 2024-06-22 DIAGNOSIS — Z125 Encounter for screening for malignant neoplasm of prostate: Secondary | ICD-10-CM | POA: Diagnosis not present

## 2024-06-22 DIAGNOSIS — E291 Testicular hypofunction: Secondary | ICD-10-CM | POA: Diagnosis not present

## 2024-06-22 DIAGNOSIS — Z1211 Encounter for screening for malignant neoplasm of colon: Secondary | ICD-10-CM | POA: Diagnosis not present

## 2024-06-22 DIAGNOSIS — R1310 Dysphagia, unspecified: Secondary | ICD-10-CM | POA: Diagnosis not present

## 2024-06-22 DIAGNOSIS — E785 Hyperlipidemia, unspecified: Secondary | ICD-10-CM | POA: Diagnosis not present

## 2024-06-22 DIAGNOSIS — G35D Multiple sclerosis, unspecified: Secondary | ICD-10-CM | POA: Diagnosis not present

## 2024-06-22 DIAGNOSIS — F845 Asperger's syndrome: Secondary | ICD-10-CM | POA: Diagnosis not present

## 2024-06-22 DIAGNOSIS — I10 Essential (primary) hypertension: Secondary | ICD-10-CM | POA: Diagnosis not present

## 2024-07-07 DIAGNOSIS — E291 Testicular hypofunction: Secondary | ICD-10-CM | POA: Diagnosis not present

## 2024-07-28 ENCOUNTER — Encounter: Payer: Self-pay | Admitting: Neurology

## 2024-08-11 ENCOUNTER — Encounter: Payer: Self-pay | Admitting: Neurology

## 2024-08-18 ENCOUNTER — Encounter: Payer: Self-pay | Admitting: Neurology

## 2024-08-18 ENCOUNTER — Ambulatory Visit (INDEPENDENT_AMBULATORY_CARE_PROVIDER_SITE_OTHER): Admitting: Neurology

## 2024-08-18 VITALS — BP 127/79 | HR 84 | Ht 68.0 in | Wt 151.5 lb

## 2024-08-18 DIAGNOSIS — F845 Asperger's syndrome: Secondary | ICD-10-CM | POA: Diagnosis not present

## 2024-08-18 DIAGNOSIS — R269 Unspecified abnormalities of gait and mobility: Secondary | ICD-10-CM | POA: Diagnosis not present

## 2024-08-18 DIAGNOSIS — F319 Bipolar disorder, unspecified: Secondary | ICD-10-CM | POA: Diagnosis not present

## 2024-08-18 DIAGNOSIS — G35C1 Active secondary progressive multiple sclerosis: Secondary | ICD-10-CM

## 2024-08-18 DIAGNOSIS — R531 Weakness: Secondary | ICD-10-CM | POA: Diagnosis not present

## 2024-08-18 DIAGNOSIS — R4781 Slurred speech: Secondary | ICD-10-CM | POA: Diagnosis not present

## 2024-08-18 NOTE — Progress Notes (Addendum)
 "  [facility]  PATIENT: George [name] DOB: [birth year]  REFERRING DOCTOR OR PCP: PCP is Norleen Lewis.  Patient has seen Dr. Margaret SOURCE: Patient, mother, notes from Dr. Margaret, MRI imaging reports and lab reports.  MRI images personally reviewed  _________________________________   HISTORICAL  CHIEF COMPLAINT:  Chief Complaint  Patient presents with   Follow-up    Room 10 Alone MS 6 month follow up    HISTORY OF PRESENT ILLNESS:  George [name] is a 54 y.o. man with relapsing remitting multiple sclerosis.  Update 08/18/2024: He tried to commit suicide couple months ago.   He was having issues with his parents.   Besides the MS, he also has Bipolar and Autism.  He sees a veterinary surgeon oce a week and psychiatry.   For a few weeks, the police were checking on him as well  He reports that he has not had any MS exacerbations though he has had slow continued progression.  We had discussed the BTKi  drugs -  unfortunately, they are not available and tolebrutinib was not yet approved due to liver toxicity.  He feels mostly stable.   He feels his balance and core strength are doing better.   He goes to the gym 6 days a week.    He notes mild progression of gait issues and tries to avoid using the walker  He notes gait and balance are mildly off but no worse than last visit.    He has no falls.   He uses the bannister on stairs.   Last MRI we did 03/15/2021 showed no new lesions.   He also had MRI brain and cervical spine at [facility] showing no acute changes.    He has lesions at C6 (right) and T10-T11.   T2 hyperintense lesions are seen also seen involving the right thalamus, midbrain, pons and medulla as well as bilateral middle cerebellar peduncles.   MRI of the thracic spine 02/18/2023 showed a normal spinal cord.   Since last visit, he has done some physical and speech therapy.  Swallow evaluation was ordered due to choking on food and drink.  He feels that this is  actually better now than it was earlier in the year.  The swallow study was normal.  His gait is unbalanced .   He stumbles but has no falls over the last few weeks.  He has trouble on stairs.  Sometimes the right foot will drag when he walks.  One fall due to balance. He has mild right hand numbness and clumsiness.   Vision is fine.  Bladder function is usually good though he has some urgency with use of caffeine .    He is concerned about a tremor.  I did not note a tremor on today's visit.  He was worried about Parkinson's disease.  He as seen speech therapy for swallow function.   The states they feel swallow issues are more psychologic than due to MS.    He is active and tries to exercise daily.    He has some fatigue but no more than last year.     He is sleeping well most nights.    He has bipolar disease and is currently on Depakote and Buspar.   He has mood swings and is quick to get angry.    Wellbutrin caused constipation.   SSRI's were poorly tolerated.   He feels medications have helped      He takes Vit D and B12 supplements.  MS HISTORY: He has a h/o Asperger syndrome and bipolar disease.   In 2012, he had an MRI of the brain and was found to have multiple T2/FLAIR hyperintense foci in the brain.   A lumbar puncture at the time was negative.    Over the last year, he began to have more difficulty with right hand coordination and left foot drop.    Also the right side of his face was drooping some.     He came in to see Dr. Margaret and had repeat imaging studies showing progression of the white matter foci and the appearance of a small focus at C5.    Repeat LP, however, showed negative CSF (no OCB) again.   He started glatiramer  10/2020.  We had discussed other med's as well.     OTHER MEDICAL HISTORY He has bipolar disease (sees Dr. Vincente).  He also has autism.   He is on Depakote.   He notes being more emotional and easily gets overwhelmed with emotions.   This had happened when  he was younger but then got better again.      He has irritable bowel syndrome and takes B12 since he was told absorption may be affected.   B12 level was fine.     Imaging studies reviewed: MRI of the brain 11/21/2010 shows multiple T2/FLAIR hyperintense foci including a focus in the left pons, middle cerebellar peduncle, right thalamus.  Some foci of periventricular and some foci are juxtacortical.  None of the foci enhanced.  MRI of the brain 05/16/2020 showed a similar pattern at the 2012 MRI but there has been mild progression with at least 2 new lesions noted in the white matter compared to the previous MRI.  As seen before there is a large focus involving the left greater than right pons.  It may be slightly larger.  There is also involvement of the middle cerebellar peduncle which is stable on the right but a new focus on the left.  There is one stable focus in the right thalamus.  MRI of the cervical spine 08/29/2020 showed a small focus posteriorly to the right adjacent to C5.  Mild DJD at C5-C6 not causing nerve root compression.  MRI of the thoracic spine 08/29/2020 showed a normal spinal cord (subtle focus at T10-T11 more likely artifact).  There are cystic lesions within the kidneys  MRI of the brain 03/15/2021 and it was unchanged.   No new foci.  MRI of the cervical spine 03/15/2021 showed a focus to the right at [facility].  MRIs from [facility] November 2023 and compared them to the MRIs from 2022.  There did not appear to be any new activity.  MRI of the thoracic spine 02/18/2023 showed a normal spinal cord.    MRI of the brain 07/24/2023.    Laboratory results. ANA, ANCA, HIV, hep B antibodies, hep C antibody were all negative or normal.  Vitamin D was mildly low.  Vitamin B12 was fine (elevated), creatinine was mildly elevated.  JCV antibody was positive at 1.3.  There were no oligoclonal bands in the CSF.  Protein was mildly elevated at 54.  CSF 09/13/2020 did NOT show OCB  REVIEW OF  SYSTEMS: Constitutional: No fevers, chills, sweats, or change in appetite Eyes: No visual changes, double vision, eye pain Ear, nose and throat: No hearing loss, ear pain, nasal congestion, sore throat Cardiovascular: No chest pain, palpitations Respiratory:  No shortness of breath at rest or with exertion.   No  wheezes GastrointestinaI: No nausea, vomiting, diarrhea, abdominal pain, fecal incontinence Genitourinary:  No dysuria, urinary retention or frequency.  No nocturia. Musculoskeletal:  No neck pain, back pain Integumentary: No rash, pruritus, skin lesions Neurological: as above Psychiatric: Notes some depression and anxiety at this time.  Diagnosed with Bipolar in past.   Endocrine: No palpitations, diaphoresis, change in appetite, change in weigh or increased thirst Hematologic/Lymphatic:  No anemia, purpura, petechiae. Allergic/Immunologic: No itchy/runny eyes, nasal congestion, recent allergic reactions, rashes  ALLERGIES: Allergies  Allergen Reactions   Other Other (See Comments)    Can only eat at home    HOME MEDICATIONS:  Current Outpatient Medications:    amLODipine-valsartan (EXFORGE) 5-320 MG tablet, Take 1 tablet by mouth daily., Disp: , Rfl:    busPIRone (BUSPAR) 10 MG tablet, Take 20 mg by mouth 2 (two) times daily., Disp: , Rfl:    Cholecalciferol (VITAMIN D3) 50 MCG (2000 UT) TABS, Take 2,000 Units by mouth in the morning., Disp: , Rfl:    COPAXONE  40 MG/ML SOSY, Inject 40mg  subcutaneously three (3) times per week, Disp: 12 mL, Rfl: 6   cyclobenzaprine  (FLEXERIL ) 10 MG tablet, Take 1 tablet (10 mg total) by mouth 2 (two) times daily as needed for muscle spasms., Disp: 20 tablet, Rfl: 0   diazepam  (VALIUM ) 5 MG tablet, Take one or two before MRi, Disp: 2 tablet, Rfl: 0   divalproex (DEPAKOTE) 250 MG DR tablet, Take 1,500 mg by mouth daily., Disp: , Rfl:    SYNTHROID 100 MCG tablet, Take 100 mcg by mouth daily before breakfast. , Disp: , Rfl:    testosterone   enanthate (DELATESTRYL) 200 MG/ML injection, Inject 150 mg into the muscle every 14 (fourteen) days. For IM use only  Every three weeks, Disp: , Rfl:   PAST MEDICAL HISTORY: Past Medical History:  Diagnosis Date   Anxiety    Asperger's syndrome    Autistic spectrum disorder    Asperger's (per H&P Olivia Clelland, PA)   Bipolar 1 disorder (HCC)    no mood swings in 20 years  on medication   GERD (gastroesophageal reflux disease)    Head injury 2020   Hypertension    Hypothyroidism    IBS (irritable bowel syndrome)     PAST SURGICAL HISTORY: Past Surgical History:  Procedure Laterality Date   NO PAST SURGERIES     RADIOLOGY WITH ANESTHESIA N/A 05/16/2020   Procedure: MRI BRAIN WITH AND WITHOUT CONTRAST;  Surgeon: Radiologist, Medication, MD;  Location: [facility] OR;  Service: Radiology;  Laterality: N/A;   RADIOLOGY WITH ANESTHESIA N/A 08/29/2020   Procedure: MRI WITH ANESTHESIA CERVICAL SPINE WITH AND WITHOUT CONTRAST, THORACIC SPINE WITH AND WITHOUT CONTRAST;  Surgeon: Radiologist, Medication, MD;  Location: [facility] OR;  Service: Radiology;  Laterality: N/A;   RADIOLOGY WITH ANESTHESIA N/A 03/15/2021   Procedure: MRI BRAIN WITH AND WITHOUT, CERIVAL WITH AND WITHOUT;  Surgeon: Radiologist, Medication, MD;  Location: [facility] OR;  Service: Radiology;  Laterality: N/A;    FAMILY HISTORY: Family History  Problem Relation Age of Onset   Hypertension Father    Depression Sister    Hypertension Paternal Grandfather     SOCIAL HISTORY:  Social History   Socioeconomic History   Marital status: Single    Spouse name: Not on file   Number of children: 0   Years of education: college   Highest education level: Not on file  Occupational History    Comment: artist  Tobacco Use   Smoking status: Never  Smokeless tobacco: Never  Vaping Use   Vaping status: Never Used  Substance and Sexual Activity   Alcohol use: No   Drug use: No   Sexual activity: Not on file  Other Topics Concern   Not  on file  Social History Narrative   He lives at home.    He is single and has no children.     He denies use of tobacco, alcohol, illicit drugs and caffeine .    He works as an tree surgeon.    Social Drivers of Health   Tobacco Use: Low Risk (08/18/2024)   Patient History    Smoking Tobacco Use: Never    Smokeless Tobacco Use: Never    Passive Exposure: Not on file  Financial Resource Strain: Not on file  Food Insecurity: Not on file  Transportation Needs: Not on file  Physical Activity: Not on file  Stress: Not on file  Social Connections: Not on file  Intimate Partner Violence: Not on file  Depression (EYV7-0): Not on file  Alcohol Screen: Not on file  Housing: Not on file  Utilities: Not on file  Health Literacy: Not on file     PHYSICAL EXAM  Vitals:   08/18/24 1337  BP: 127/79  Pulse: 84  SpO2: 96%  Weight: 151 lb 8 oz (68.7 kg)  Height: 5' 8 (1.727 m)    Body mass index is 23.04 kg/m.   General: The patient is well-developed and well-nourished and in no acute distress  HEENT:  Head is [place]/AT.  Sclera are anicteric.   Neck:  The neck is nontender.  Skin: Extremities are without rash or  edema.   Neurologic Exam  Mental status: The patient is alert and oriented x 3 at the time of the examination. The patient has apparent normal recent and remote memory, with an apparently normal attention span and concentration ability.   Speech is normal.  Cranial nerves: Extraocular movements are full.   There is mild weakness in the right lower face  Facial sensation is normal.     Trapezius and sternocleidomastoid strength is normal. No dysarthria is noted.    No obvious hearing deficits are noted.  Motor: He has a mild tremor.  Muscle bulk is normal.   Tone is mildly increased on the right. Strength is  5 / 5 in left side, 4/5 right triceps and intrinsic hand muscles, 4+ biceps and deltoid,   4/5 right  leg except 4-/5 iliopsoas and ankle/toe extensors.    Reduced RAM in  right arm and foot  Sensory: Sensory testing is intact to pinprick, soft touch and vibration sensation in arms and slightly reduced vibraiton in right leg.  Coordination: Cerebellar testing reveals mildly reduced coordination right hand and foot.   Reduced heel-to-shin.  Gait and station: Station is normal.   He has a right foot drop.   Tandem gait is poor.  Romberg is negative.  Reflexes: Deep tendon reflexes are symmetric and normal in arms and increased right leg.        DIAGNOSTIC DATA (LABS, IMAGING, TESTING) - I reviewed patient records, labs, notes, testing and imaging myself where available.  Lab Results  Component Value Date   WBC 7.1 09/11/2020   HGB 17.4 09/11/2020   HCT 52.4 (H) 09/11/2020   MCV 85 09/11/2020   PLT 206 09/11/2020      Component Value Date/Time   NA 138 03/15/2021 0934   NA 142 09/11/2020 1035   K 4.2 03/15/2021 0934  CL 104 03/15/2021 0934   CO2 26 03/15/2021 0934   GLUCOSE 87 03/15/2021 0934   BUN 14 03/15/2021 0934   BUN 16 09/11/2020 1035   CREATININE 1.22 03/15/2021 0934   CREATININE 1.14 03/04/2014 1310   CALCIUM 9.2 03/15/2021 0934   PROT 7.3 09/11/2020 1035   ALBUMIN 4.6 09/11/2020 1035   AST 16 09/11/2020 1035   ALT 20 09/11/2020 1035   ALKPHOS 67 09/11/2020 1035   BILITOT 0.3 09/11/2020 1035   GFRNONAA >60 03/15/2021 0934   GFRAA 72 09/11/2020 1035   No results found for: CHOL, HDL, LDLCALC, LDLDIRECT, TRIG, CHOLHDL Lab Results  Component Value Date   HGBA1C 5.4 09/11/2020   Lab Results  Component Value Date   VITAMINB12 >2000 (H) 09/11/2020   Lab Results  Component Value Date   TSH 1.400 09/11/2020       ASSESSMENT AND PLAN  Active secondary progressive multiple sclerosis  Slurred speech  Asperger syndrome  Right sided weakness  Bipolar disease, chronic (HCC)  Gait disturbance   For now he will continue glatiramer  .  We went over natural history of MS going from a more inflammatory  disease with relapses and MRI changes to a degenerative disease with progressive impairments in the absence of new MRI activity.    He asked about medications that may come out soon and we discussed the BTKi's and anti-CD40L drugs in trials or at the FDA but there has been setbacks (tolebrutinib) due to safety issues so likely no med will be out until 2027  3.   Stay active and exercie as tolerated. 4.   Continue vit D supplements   5.   Return to see us  in 6 months or sooner for new or worsening neurologic symptoms.        This visit is part of a comprehensive longitudinal care medical relationship regarding the patients primary diagnosis of multiple sclerosis and related concerns.    George [name] A. Vear, MD, [facility] 08/18/2024, 3:05 PM Certified in Neurology, Clinical Neurophysiology, Sleep Medicine and Neuroimaging  [facility] [address] [place], [place] [address] [phone] "

## 2025-04-25 ENCOUNTER — Ambulatory Visit: Admitting: Neurology
